# Patient Record
Sex: Male | Born: 1935
Health system: Southern US, Community
[De-identification: ages and names within clinical notes are randomized; demographics above are authoritative.]

## PROBLEM LIST (undated history)

## (undated) DIAGNOSIS — Z87442 Personal history of urinary calculi: Secondary | ICD-10-CM

## (undated) DIAGNOSIS — C801 Malignant (primary) neoplasm, unspecified: Secondary | ICD-10-CM

## (undated) DIAGNOSIS — F329 Major depressive disorder, single episode, unspecified: Secondary | ICD-10-CM

## (undated) DIAGNOSIS — G4730 Sleep apnea, unspecified: Secondary | ICD-10-CM

## (undated) DIAGNOSIS — F419 Anxiety disorder, unspecified: Secondary | ICD-10-CM

## (undated) DIAGNOSIS — F32A Depression, unspecified: Secondary | ICD-10-CM

## (undated) DIAGNOSIS — K5090 Crohn's disease, unspecified, without complications: Secondary | ICD-10-CM

## (undated) DIAGNOSIS — E119 Type 2 diabetes mellitus without complications: Secondary | ICD-10-CM

## (undated) DIAGNOSIS — E7800 Pure hypercholesterolemia, unspecified: Secondary | ICD-10-CM

## (undated) DIAGNOSIS — M1990 Unspecified osteoarthritis, unspecified site: Secondary | ICD-10-CM

## (undated) DIAGNOSIS — M545 Low back pain, unspecified: Secondary | ICD-10-CM

## (undated) DIAGNOSIS — T884XXA Failed or difficult intubation, initial encounter: Secondary | ICD-10-CM

## (undated) DIAGNOSIS — I10 Essential (primary) hypertension: Secondary | ICD-10-CM

## (undated) DIAGNOSIS — K219 Gastro-esophageal reflux disease without esophagitis: Secondary | ICD-10-CM

## (undated) DIAGNOSIS — G8929 Other chronic pain: Secondary | ICD-10-CM

## (undated) DIAGNOSIS — M109 Gout, unspecified: Secondary | ICD-10-CM

## (undated) DIAGNOSIS — R7303 Prediabetes: Secondary | ICD-10-CM

## (undated) HISTORY — PX: PROSTATE SURGERY: SHX751

## (undated) HISTORY — DX: Crohn's disease, unspecified, without complications: K50.90

## (undated) HISTORY — PX: PARTIAL COLECTOMY: SHX5273

## (undated) HISTORY — DX: Essential (primary) hypertension: I10

## (undated) HISTORY — DX: Gastro-esophageal reflux disease without esophagitis: K21.9

## (undated) HISTORY — PX: TONSILLECTOMY AND ADENOIDECTOMY: SUR1326

## (undated) HISTORY — PX: THYROIDECTOMY: SHX17

## (undated) HISTORY — PX: COLON RESECTION: SHX5231

## (undated) HISTORY — DX: Prediabetes: R73.03

## (undated) HISTORY — PX: UPPER GASTROINTESTINAL ENDOSCOPY: SHX188

## (undated) HISTORY — PX: COLONOSCOPY: SHX174

## (undated) SURGERY — Surgical Case
Anesthesia: *Unknown

---

## 1957-02-02 HISTORY — PX: HEMORRHOID SURGERY: SHX153

## 1968-10-03 HISTORY — PX: KIDNEY STONE SURGERY: SHX686

## 1971-02-03 HISTORY — PX: COLECTOMY: SHX59

## 2000-09-22 ENCOUNTER — Ambulatory Visit (HOSPITAL_COMMUNITY): Admission: RE | Admit: 2000-09-22 | Discharge: 2000-09-22 | Payer: Self-pay | Admitting: Family Medicine

## 2000-09-24 ENCOUNTER — Encounter: Payer: Self-pay | Admitting: Family Medicine

## 2000-09-24 ENCOUNTER — Ambulatory Visit (HOSPITAL_COMMUNITY): Admission: RE | Admit: 2000-09-24 | Discharge: 2000-09-24 | Payer: Self-pay | Admitting: Family Medicine

## 2000-09-29 ENCOUNTER — Ambulatory Visit (HOSPITAL_COMMUNITY): Admission: RE | Admit: 2000-09-29 | Discharge: 2000-09-29 | Payer: Self-pay | Admitting: Cardiology

## 2000-09-29 ENCOUNTER — Encounter: Payer: Self-pay | Admitting: Cardiology

## 2001-12-05 ENCOUNTER — Ambulatory Visit (HOSPITAL_COMMUNITY): Admission: RE | Admit: 2001-12-05 | Discharge: 2001-12-05 | Payer: Self-pay | Admitting: Internal Medicine

## 2004-03-05 ENCOUNTER — Ambulatory Visit: Payer: Self-pay | Admitting: Internal Medicine

## 2004-03-12 ENCOUNTER — Ambulatory Visit: Payer: Self-pay | Admitting: Internal Medicine

## 2004-03-12 ENCOUNTER — Ambulatory Visit (HOSPITAL_COMMUNITY): Admission: RE | Admit: 2004-03-12 | Discharge: 2004-03-12 | Payer: Self-pay | Admitting: Internal Medicine

## 2004-05-30 ENCOUNTER — Ambulatory Visit (HOSPITAL_COMMUNITY): Admission: RE | Admit: 2004-05-30 | Discharge: 2004-05-30 | Payer: Self-pay | Admitting: Internal Medicine

## 2004-06-02 ENCOUNTER — Ambulatory Visit: Payer: Self-pay | Admitting: Internal Medicine

## 2005-10-14 ENCOUNTER — Ambulatory Visit: Payer: Self-pay | Admitting: Internal Medicine

## 2010-05-20 ENCOUNTER — Ambulatory Visit (INDEPENDENT_AMBULATORY_CARE_PROVIDER_SITE_OTHER): Payer: Medicare Other | Admitting: Internal Medicine

## 2010-05-20 DIAGNOSIS — K5090 Crohn's disease, unspecified, without complications: Secondary | ICD-10-CM

## 2011-02-05 DIAGNOSIS — D649 Anemia, unspecified: Secondary | ICD-10-CM | POA: Diagnosis not present

## 2011-02-05 DIAGNOSIS — Z79899 Other long term (current) drug therapy: Secondary | ICD-10-CM | POA: Diagnosis not present

## 2011-02-05 DIAGNOSIS — E782 Mixed hyperlipidemia: Secondary | ICD-10-CM | POA: Diagnosis not present

## 2011-02-05 DIAGNOSIS — M109 Gout, unspecified: Secondary | ICD-10-CM | POA: Diagnosis not present

## 2011-02-05 DIAGNOSIS — E119 Type 2 diabetes mellitus without complications: Secondary | ICD-10-CM | POA: Diagnosis not present

## 2011-02-05 DIAGNOSIS — Z125 Encounter for screening for malignant neoplasm of prostate: Secondary | ICD-10-CM | POA: Diagnosis not present

## 2011-02-05 DIAGNOSIS — Z0000 Encounter for general adult medical examination without abnormal findings: Secondary | ICD-10-CM | POA: Diagnosis not present

## 2011-03-10 DIAGNOSIS — R809 Proteinuria, unspecified: Secondary | ICD-10-CM | POA: Diagnosis not present

## 2011-03-10 DIAGNOSIS — R7309 Other abnormal glucose: Secondary | ICD-10-CM | POA: Diagnosis not present

## 2011-04-07 DIAGNOSIS — Z79899 Other long term (current) drug therapy: Secondary | ICD-10-CM | POA: Diagnosis not present

## 2011-05-07 ENCOUNTER — Encounter (INDEPENDENT_AMBULATORY_CARE_PROVIDER_SITE_OTHER): Payer: Self-pay | Admitting: *Deleted

## 2011-06-02 DIAGNOSIS — H2510 Age-related nuclear cataract, unspecified eye: Secondary | ICD-10-CM | POA: Diagnosis not present

## 2011-06-02 DIAGNOSIS — H52229 Regular astigmatism, unspecified eye: Secondary | ICD-10-CM | POA: Diagnosis not present

## 2011-06-02 DIAGNOSIS — H5200 Hypermetropia, unspecified eye: Secondary | ICD-10-CM | POA: Diagnosis not present

## 2011-06-02 DIAGNOSIS — H524 Presbyopia: Secondary | ICD-10-CM | POA: Diagnosis not present

## 2011-06-23 ENCOUNTER — Encounter (INDEPENDENT_AMBULATORY_CARE_PROVIDER_SITE_OTHER): Payer: Self-pay | Admitting: Internal Medicine

## 2011-06-23 ENCOUNTER — Ambulatory Visit (INDEPENDENT_AMBULATORY_CARE_PROVIDER_SITE_OTHER): Payer: Medicare Other | Admitting: Internal Medicine

## 2011-06-23 VITALS — BP 130/78 | HR 80 | Temp 98.9°F | Resp 20 | Ht 69.0 in | Wt 227.9 lb

## 2011-06-23 DIAGNOSIS — E785 Hyperlipidemia, unspecified: Secondary | ICD-10-CM | POA: Insufficient documentation

## 2011-06-23 DIAGNOSIS — M109 Gout, unspecified: Secondary | ICD-10-CM | POA: Insufficient documentation

## 2011-06-23 DIAGNOSIS — K219 Gastro-esophageal reflux disease without esophagitis: Secondary | ICD-10-CM | POA: Insufficient documentation

## 2011-06-23 DIAGNOSIS — K5080 Crohn's disease of both small and large intestine without complications: Secondary | ICD-10-CM

## 2011-06-23 DIAGNOSIS — I10 Essential (primary) hypertension: Secondary | ICD-10-CM | POA: Insufficient documentation

## 2011-06-23 NOTE — Patient Instructions (Signed)
No changes made in medications today.

## 2011-06-23 NOTE — Progress Notes (Signed)
Presenting complaint;  Follow for ileocolonic Crohn's disease.  Subjective:  Thomas David is 76 year old Caucasian male who has a 40 year history of inflammatory bowel disease. He had right, colectomy over 20 years ago. His last colonoscopy was in July 20 10th revealing active disease at ileocolonic anastomosis with ulceration and noncritical stricture and few colonic erosions. He has been maintained on mesalamine for years. He was last seen one year ago. He continues to feel well. He generally has 2-4 bowel movements per day. Stools are usually phoned to sound muffled. He denies melena rectal bleeding abdominal pain anorexia or weight loss. His heartburn is well controlled with PPI. Patient tells me that his granddaughter whom I treated in the past and who now lives in [place] was given Remicade and had severe reaction but is doing better now.  Current Medications: Current Outpatient Prescriptions  Medication Sig Dispense Refill  . allopurinol (ZYLOPRIM) 300 MG tablet Take 300 mg by mouth.      Marland Kitchen amLODipine (NORVASC) 5 MG tablet Take 5 mg by mouth daily.      Marland Kitchen lisinopril (PRINIVIL,ZESTRIL) 2.5 MG tablet Take 2.5 mg by mouth daily.      . mesalamine (PENTASA) 500 MG CR capsule Take 500 mg by mouth. Patient takes 2 by mouth twice a day      . Multiple Vitamins-Minerals (MULTI FOR HIM) CAPS Take by mouth daily.      . nadolol (CORGARD) 80 MG tablet Take 80 mg by mouth daily.      Marland Kitchen omeprazole (PRILOSEC) 20 MG capsule Take 20 mg by mouth daily.      . simvastatin (ZOCOR) 20 MG tablet Take 20 mg by mouth every evening.         Objective: Blood pressure 130/78, pulse 80, temperature 98.9 F (37.2 C), temperature source Oral, resp. rate 20, height 5' 9"  (1.753 m), weight 227 lb 14.4 oz (103.375 kg). Conjunctiva is pink. Sclera is nonicteric Oropharyngeal mucosa is normal. No neck masses or thyromegaly noted. Cardiac exam with regular rhythm normal S1 and S2. No murmur or gallop noted. Lungs are  clear to auscultation. Abdomen is full. Bowel sounds are normal. On palpation is soft and nontender without organomegaly or masses.  No LE edema or clubbing noted.  Labs/studies Results: None available. He did have repeat blood count at the time of his physical examination earlier this year and tests were normal.  Assessment:  Ileocolonic Crohn's disease. Patient is on low dose mesalamine and appears to be doing well.   Plan:  Continue Pentasa and 1 g by mouth twice a day. Samples given. Unless has problems will return for office visit in one year. Will consider colonoscopy in July 2015.

## 2011-07-20 DIAGNOSIS — E785 Hyperlipidemia, unspecified: Secondary | ICD-10-CM | POA: Diagnosis not present

## 2011-07-20 DIAGNOSIS — E782 Mixed hyperlipidemia: Secondary | ICD-10-CM | POA: Diagnosis not present

## 2011-07-20 DIAGNOSIS — E119 Type 2 diabetes mellitus without complications: Secondary | ICD-10-CM | POA: Diagnosis not present

## 2011-07-20 DIAGNOSIS — I10 Essential (primary) hypertension: Secondary | ICD-10-CM | POA: Diagnosis not present

## 2011-07-20 DIAGNOSIS — Z79899 Other long term (current) drug therapy: Secondary | ICD-10-CM | POA: Diagnosis not present

## 2011-12-09 DIAGNOSIS — Z23 Encounter for immunization: Secondary | ICD-10-CM | POA: Diagnosis not present

## 2012-02-08 ENCOUNTER — Other Ambulatory Visit: Payer: Self-pay | Admitting: Family Medicine

## 2012-02-08 ENCOUNTER — Ambulatory Visit (HOSPITAL_COMMUNITY)
Admission: RE | Admit: 2012-02-08 | Discharge: 2012-02-08 | Disposition: A | Discharge status: Home / Self Care | Payer: Medicare Other | Source: Ambulatory Visit | Attending: Family Medicine | Admitting: Family Medicine

## 2012-02-08 DIAGNOSIS — R05 Cough: Secondary | ICD-10-CM

## 2012-02-08 DIAGNOSIS — R059 Cough, unspecified: Secondary | ICD-10-CM | POA: Insufficient documentation

## 2012-02-08 DIAGNOSIS — E782 Mixed hyperlipidemia: Secondary | ICD-10-CM | POA: Diagnosis not present

## 2012-02-08 DIAGNOSIS — R918 Other nonspecific abnormal finding of lung field: Secondary | ICD-10-CM | POA: Diagnosis not present

## 2012-02-08 DIAGNOSIS — E119 Type 2 diabetes mellitus without complications: Secondary | ICD-10-CM | POA: Diagnosis not present

## 2012-02-08 DIAGNOSIS — Z125 Encounter for screening for malignant neoplasm of prostate: Secondary | ICD-10-CM | POA: Diagnosis not present

## 2012-02-08 DIAGNOSIS — J984 Other disorders of lung: Secondary | ICD-10-CM | POA: Diagnosis not present

## 2012-02-08 DIAGNOSIS — E785 Hyperlipidemia, unspecified: Secondary | ICD-10-CM | POA: Diagnosis not present

## 2012-02-08 DIAGNOSIS — Z0000 Encounter for general adult medical examination without abnormal findings: Secondary | ICD-10-CM | POA: Diagnosis not present

## 2012-02-08 DIAGNOSIS — M109 Gout, unspecified: Secondary | ICD-10-CM | POA: Diagnosis not present

## 2012-02-08 DIAGNOSIS — Z79899 Other long term (current) drug therapy: Secondary | ICD-10-CM | POA: Diagnosis not present

## 2012-02-16 ENCOUNTER — Other Ambulatory Visit: Payer: Self-pay | Admitting: Family Medicine

## 2012-02-16 DIAGNOSIS — R05 Cough: Secondary | ICD-10-CM

## 2012-02-24 ENCOUNTER — Ambulatory Visit (HOSPITAL_COMMUNITY)
Admission: RE | Admit: 2012-02-24 | Discharge: 2012-02-24 | Disposition: A | Discharge status: Home / Self Care | Payer: Medicare Other | Source: Ambulatory Visit | Attending: Family Medicine | Admitting: Family Medicine

## 2012-02-24 DIAGNOSIS — R918 Other nonspecific abnormal finding of lung field: Secondary | ICD-10-CM | POA: Diagnosis not present

## 2012-02-24 DIAGNOSIS — R059 Cough, unspecified: Secondary | ICD-10-CM | POA: Insufficient documentation

## 2012-02-24 DIAGNOSIS — R05 Cough: Secondary | ICD-10-CM | POA: Diagnosis not present

## 2012-02-24 DIAGNOSIS — R599 Enlarged lymph nodes, unspecified: Secondary | ICD-10-CM | POA: Insufficient documentation

## 2012-02-24 MED ORDER — IOHEXOL 300 MG/ML  SOLN
80.0000 mL | Freq: Once | INTRAMUSCULAR | Status: AC | PRN
  Administered 2012-02-24: 80 mL via INTRAVENOUS

## 2012-02-25 ENCOUNTER — Other Ambulatory Visit: Payer: Self-pay | Admitting: Family Medicine

## 2012-02-25 DIAGNOSIS — E279 Disorder of adrenal gland, unspecified: Secondary | ICD-10-CM

## 2012-02-25 DIAGNOSIS — E0590 Thyrotoxicosis, unspecified without thyrotoxic crisis or storm: Secondary | ICD-10-CM | POA: Diagnosis not present

## 2012-02-25 DIAGNOSIS — E049 Nontoxic goiter, unspecified: Secondary | ICD-10-CM

## 2012-02-25 DIAGNOSIS — D497 Neoplasm of unspecified behavior of endocrine glands and other parts of nervous system: Secondary | ICD-10-CM | POA: Diagnosis not present

## 2012-02-25 DIAGNOSIS — Z79899 Other long term (current) drug therapy: Secondary | ICD-10-CM | POA: Diagnosis not present

## 2012-03-01 ENCOUNTER — Ambulatory Visit (HOSPITAL_COMMUNITY)
Admission: RE | Admit: 2012-03-01 | Discharge: 2012-03-01 | Disposition: A | Discharge status: Home / Self Care | Payer: Medicare Other | Source: Ambulatory Visit | Attending: Family Medicine | Admitting: Family Medicine

## 2012-03-01 DIAGNOSIS — E049 Nontoxic goiter, unspecified: Secondary | ICD-10-CM

## 2012-03-01 DIAGNOSIS — Q619 Cystic kidney disease, unspecified: Secondary | ICD-10-CM | POA: Insufficient documentation

## 2012-03-01 DIAGNOSIS — K7689 Other specified diseases of liver: Secondary | ICD-10-CM | POA: Diagnosis not present

## 2012-03-01 DIAGNOSIS — E041 Nontoxic single thyroid nodule: Secondary | ICD-10-CM | POA: Diagnosis not present

## 2012-03-01 DIAGNOSIS — E279 Disorder of adrenal gland, unspecified: Secondary | ICD-10-CM | POA: Insufficient documentation

## 2012-03-01 DIAGNOSIS — D3500 Benign neoplasm of unspecified adrenal gland: Secondary | ICD-10-CM | POA: Diagnosis not present

## 2012-03-01 DIAGNOSIS — N200 Calculus of kidney: Secondary | ICD-10-CM | POA: Diagnosis not present

## 2012-03-01 MED ORDER — GADOBENATE DIMEGLUMINE 529 MG/ML IV SOLN
19.0000 mL | Freq: Once | INTRAVENOUS | Status: AC | PRN
  Administered 2012-03-01: 19 mL via INTRAVENOUS

## 2012-03-02 DIAGNOSIS — N281 Cyst of kidney, acquired: Secondary | ICD-10-CM | POA: Diagnosis not present

## 2012-03-02 DIAGNOSIS — E041 Nontoxic single thyroid nodule: Secondary | ICD-10-CM | POA: Diagnosis not present

## 2012-03-02 DIAGNOSIS — G4730 Sleep apnea, unspecified: Secondary | ICD-10-CM | POA: Diagnosis not present

## 2012-03-02 DIAGNOSIS — D497 Neoplasm of unspecified behavior of endocrine glands and other parts of nervous system: Secondary | ICD-10-CM | POA: Diagnosis not present

## 2012-03-03 ENCOUNTER — Other Ambulatory Visit: Payer: Self-pay | Admitting: Family Medicine

## 2012-03-03 DIAGNOSIS — E049 Nontoxic goiter, unspecified: Secondary | ICD-10-CM

## 2012-03-08 ENCOUNTER — Other Ambulatory Visit (HOSPITAL_COMMUNITY): Payer: Self-pay | Admitting: Family Medicine

## 2012-03-08 ENCOUNTER — Ambulatory Visit (HOSPITAL_COMMUNITY)
Admission: RE | Admit: 2012-03-08 | Discharge: 2012-03-08 | Disposition: A | Discharge status: Home / Self Care | Payer: Medicare Other | Source: Ambulatory Visit | Attending: Family Medicine | Admitting: Family Medicine

## 2012-03-08 ENCOUNTER — Other Ambulatory Visit: Payer: Self-pay | Admitting: Family Medicine

## 2012-03-08 ENCOUNTER — Inpatient Hospital Stay (HOSPITAL_COMMUNITY)
Admission: RE | Admit: 2012-03-08 | Discharge: 2012-03-08 | Disposition: A | Discharge status: Home / Self Care | Payer: Medicare Other | Source: Ambulatory Visit

## 2012-03-08 DIAGNOSIS — E049 Nontoxic goiter, unspecified: Secondary | ICD-10-CM | POA: Diagnosis not present

## 2012-03-08 DIAGNOSIS — R221 Localized swelling, mass and lump, neck: Secondary | ICD-10-CM | POA: Diagnosis not present

## 2012-03-08 DIAGNOSIS — R220 Localized swelling, mass and lump, head: Secondary | ICD-10-CM | POA: Diagnosis not present

## 2012-03-08 MED ORDER — LIDOCAINE HCL (PF) 2 % IJ SOLN
INTRAMUSCULAR | Status: AC
  Filled 2012-03-08: qty 10

## 2012-03-08 NOTE — Progress Notes (Signed)
Pt here for Right Thyroid Biopsy.    Procedure started at 1200.  Lidocaine 2% 1cc injected into  right neck.

## 2012-04-06 ENCOUNTER — Other Ambulatory Visit: Payer: Self-pay | Admitting: Otolaryngology

## 2012-04-06 DIAGNOSIS — D449 Neoplasm of uncertain behavior of unspecified endocrine gland: Secondary | ICD-10-CM | POA: Diagnosis not present

## 2012-04-06 DIAGNOSIS — J310 Chronic rhinitis: Secondary | ICD-10-CM | POA: Diagnosis not present

## 2012-04-20 ENCOUNTER — Encounter (HOSPITAL_COMMUNITY): Payer: Self-pay

## 2012-04-21 ENCOUNTER — Other Ambulatory Visit: Payer: Self-pay

## 2012-04-21 DIAGNOSIS — G4700 Insomnia, unspecified: Secondary | ICD-10-CM

## 2012-04-28 ENCOUNTER — Encounter (HOSPITAL_COMMUNITY)
Admission: RE | Admit: 2012-04-28 | Discharge: 2012-04-28 | Disposition: A | Discharge status: Home / Self Care | Payer: Medicare Other | Source: Ambulatory Visit | Attending: Otolaryngology | Admitting: Otolaryngology

## 2012-04-28 ENCOUNTER — Encounter (HOSPITAL_COMMUNITY): Payer: Self-pay

## 2012-04-28 ENCOUNTER — Ambulatory Visit (HOSPITAL_COMMUNITY)
Admission: RE | Admit: 2012-04-28 | Discharge: 2012-04-28 | Disposition: A | Discharge status: Home / Self Care | Payer: Medicare Other | Source: Ambulatory Visit | Attending: Anesthesiology | Admitting: Anesthesiology

## 2012-04-28 DIAGNOSIS — E0789 Other specified disorders of thyroid: Secondary | ICD-10-CM | POA: Insufficient documentation

## 2012-04-28 DIAGNOSIS — Z01818 Encounter for other preprocedural examination: Secondary | ICD-10-CM | POA: Insufficient documentation

## 2012-04-28 DIAGNOSIS — Z01811 Encounter for preprocedural respiratory examination: Secondary | ICD-10-CM | POA: Diagnosis not present

## 2012-04-28 DIAGNOSIS — J9819 Other pulmonary collapse: Secondary | ICD-10-CM | POA: Diagnosis not present

## 2012-04-28 DIAGNOSIS — Z01812 Encounter for preprocedural laboratory examination: Secondary | ICD-10-CM | POA: Insufficient documentation

## 2012-04-28 DIAGNOSIS — Z01810 Encounter for preprocedural cardiovascular examination: Secondary | ICD-10-CM | POA: Insufficient documentation

## 2012-04-28 HISTORY — DX: Unspecified osteoarthritis, unspecified site: M19.90

## 2012-04-28 LAB — BASIC METABOLIC PANEL
BUN: 13 mg/dL (ref 6–23)
CO2: 25 mEq/L (ref 19–32)
Calcium: 9.7 mg/dL (ref 8.4–10.5)
Creatinine, Ser: 0.76 mg/dL (ref 0.50–1.35)
Glucose, Bld: 120 mg/dL — ABNORMAL HIGH (ref 70–99)

## 2012-04-28 LAB — CBC
MCH: 29.5 pg (ref 26.0–34.0)
MCV: 87 fL (ref 78.0–100.0)
Platelets: 191 10*3/uL (ref 150–400)
RDW: 13.9 % (ref 11.5–15.5)

## 2012-04-28 NOTE — Progress Notes (Signed)
04/28/12 1025  OBSTRUCTIVE SLEEP APNEA  Have you ever been diagnosed with sleep apnea through a sleep study? No (Schedule for test April 30)  Do you snore loudly (loud enough to be heard through closed doors)?  1  Do you often feel tired, fatigued, or sleepy during the daytime? 0  Has anyone observed you stop breathing during your sleep? 1  Do you have, or are you being treated for high blood pressure? 1  BMI more than 35 kg/m2? 0  Age over 75 years old? 1  Neck circumference greater than 40 cm/18 inches? 1  Gender: 1  Obstructive Sleep Apnea Score 6  Score 4 or greater  Results sent to PCP (scheduled for Sleep Study April 30)

## 2012-04-28 NOTE — Pre-Procedure Instructions (Signed)
Jeremaine Maraj Abed  04/28/2012   Your procedure is scheduled on:  April 2  Report to [facility] at 08:00 AM.  Call this number if you have problems the morning of surgery: [phone]   Remember:   Do not eat food or drink liquids after midnight.   Take these medicines the morning of surgery with A SIP OF WATER: Amlodipine, Flonase, Metoprolol, Omeprazole, Eye drops   STOP Vitamin B12, Xango Vitamins, Aspirin after today  Do not wear jewelry, make-up or nail polish.  Do not wear lotions, powders, or perfumes. You may wear deodorant.  Do not shave 48 hours prior to surgery. Men may shave face and neck.  Do not bring valuables to the hospital.  Contacts, dentures or bridgework may not be worn into surgery.  Leave suitcase in the car. After surgery it may be brought to your room.  For patients admitted to the hospital, checkout time is 11:00 AM the day of discharge.   Patients discharged the day of surgery will not be allowed to drive home.  Name and phone number of your driver: Family/ Friend  Special Instructions: Shower using CHG 2 nights before surgery and the night before surgery.  If you shower the day of surgery use CHG.  Use special wash - you have one bottle of CHG for all showers.  You should use approximately 1/3 of the bottle for each shower.   Please read over the following fact sheets that you were given: Pain Booklet, Coughing and Deep Breathing and Surgical Site Infection Prevention

## 2012-04-28 NOTE — Progress Notes (Signed)
Repeat CXR ordered today after speaking with [place], [place]

## 2012-05-04 ENCOUNTER — Encounter (HOSPITAL_COMMUNITY): Payer: Self-pay | Admitting: *Deleted

## 2012-05-04 ENCOUNTER — Encounter (HOSPITAL_COMMUNITY): Payer: Self-pay | Admitting: Vascular Surgery

## 2012-05-04 ENCOUNTER — Ambulatory Visit (HOSPITAL_COMMUNITY)
Admission: RE | Admit: 2012-05-04 | Discharge: 2012-05-05 | Disposition: A | Discharge status: Home / Self Care | Payer: Medicare Other | Source: Ambulatory Visit | Attending: Otolaryngology | Admitting: Otolaryngology

## 2012-05-04 ENCOUNTER — Ambulatory Visit (HOSPITAL_COMMUNITY): Payer: Medicare Other | Admitting: Certified Registered Nurse Anesthetist

## 2012-05-04 ENCOUNTER — Encounter (HOSPITAL_COMMUNITY)
Admission: RE | Disposition: A | Discharge status: Home / Self Care | Payer: Self-pay | Source: Ambulatory Visit | Attending: Otolaryngology

## 2012-05-04 DIAGNOSIS — D449 Neoplasm of uncertain behavior of unspecified endocrine gland: Secondary | ICD-10-CM | POA: Diagnosis not present

## 2012-05-04 DIAGNOSIS — K5090 Crohn's disease, unspecified, without complications: Secondary | ICD-10-CM | POA: Diagnosis not present

## 2012-05-04 DIAGNOSIS — E042 Nontoxic multinodular goiter: Secondary | ICD-10-CM | POA: Diagnosis not present

## 2012-05-04 DIAGNOSIS — I10 Essential (primary) hypertension: Secondary | ICD-10-CM | POA: Diagnosis not present

## 2012-05-04 DIAGNOSIS — K219 Gastro-esophageal reflux disease without esophagitis: Secondary | ICD-10-CM | POA: Diagnosis not present

## 2012-05-04 DIAGNOSIS — Z9889 Other specified postprocedural states: Secondary | ICD-10-CM

## 2012-05-04 HISTORY — DX: Gout, unspecified: M10.9

## 2012-05-04 HISTORY — DX: Other chronic pain: G89.29

## 2012-05-04 HISTORY — DX: Pure hypercholesterolemia, unspecified: E78.00

## 2012-05-04 HISTORY — DX: Low back pain: M54.5

## 2012-05-04 HISTORY — PX: THYROIDECTOMY: SHX17

## 2012-05-04 HISTORY — DX: Low back pain, unspecified: M54.50

## 2012-05-04 SURGERY — THYROIDECTOMY
Anesthesia: General | Site: Neck | Laterality: Right | Wound class: Clean

## 2012-05-04 MED ORDER — OXYCODONE-ACETAMINOPHEN 5-325 MG PO TABS
1.0000 | ORAL_TABLET | ORAL | Status: DC | PRN
Start: 2012-05-04 — End: 2012-06-29

## 2012-05-04 MED ORDER — HEMOSTATIC AGENTS (NO CHARGE) OPTIME
TOPICAL | Status: DC | PRN
  Administered 2012-05-04: 1 via TOPICAL

## 2012-05-04 MED ORDER — ACETAMINOPHEN 160 MG/5ML PO SOLN: 650.0000 mg | ORAL | Status: DC | PRN

## 2012-05-04 MED ORDER — CEFAZOLIN SODIUM 1-5 GM-% IV SOLN
INTRAVENOUS | Status: DC | PRN
  Administered 2012-05-04: 2 g via INTRAVENOUS

## 2012-05-04 MED ORDER — MIDAZOLAM HCL 5 MG/5ML IJ SOLN
INTRAMUSCULAR | Status: DC | PRN
  Administered 2012-05-04: 2 mg via INTRAVENOUS

## 2012-05-04 MED ORDER — ZOLPIDEM TARTRATE 5 MG PO TABS
5.0000 mg | ORAL_TABLET | Freq: Every evening | ORAL | Status: DC | PRN
  Administered 2012-05-05: 5 mg via ORAL
  Filled 2012-05-04: qty 1

## 2012-05-04 MED ORDER — LIDOCAINE-EPINEPHRINE 1 %-1:100000 IJ SOLN
INTRAMUSCULAR | Status: AC
  Filled 2012-05-04: qty 1

## 2012-05-04 MED ORDER — METOPROLOL TARTRATE 100 MG PO TABS
100.0000 mg | ORAL_TABLET | Freq: Every day | ORAL | Status: DC
  Administered 2012-05-04: 100 mg via ORAL
  Filled 2012-05-04 (×2): qty 1

## 2012-05-04 MED ORDER — AMLODIPINE BESYLATE 5 MG PO TABS
5.0000 mg | ORAL_TABLET | Freq: Every day | ORAL | Status: DC
  Administered 2012-05-04: 5 mg via ORAL
  Filled 2012-05-04 (×2): qty 1

## 2012-05-04 MED ORDER — NAPHAZOLINE HCL 0.1 % OP SOLN
1.0000 [drp] | Freq: Four times a day (QID) | OPHTHALMIC | Status: DC | PRN
  Filled 2012-05-04: qty 15

## 2012-05-04 MED ORDER — KCL IN DEXTROSE-NACL 20-5-0.45 MEQ/L-%-% IV SOLN
INTRAVENOUS | Status: DC
  Administered 2012-05-04: 22:00:00 via INTRAVENOUS
  Filled 2012-05-04 (×4): qty 1000

## 2012-05-04 MED ORDER — LIDOCAINE HCL (CARDIAC) 20 MG/ML IV SOLN
INTRAVENOUS | Status: DC | PRN
  Administered 2012-05-04: 100 mg via INTRAVENOUS

## 2012-05-04 MED ORDER — PANTOPRAZOLE SODIUM 40 MG PO TBEC
40.0000 mg | DELAYED_RELEASE_TABLET | Freq: Every day | ORAL | Status: DC
  Administered 2012-05-04: 40 mg via ORAL
  Filled 2012-05-04 (×2): qty 1

## 2012-05-04 MED ORDER — EPHEDRINE SULFATE 50 MG/ML IJ SOLN
INTRAMUSCULAR | Status: DC | PRN
  Administered 2012-05-04 (×2): 5 mg via INTRAVENOUS

## 2012-05-04 MED ORDER — SIMVASTATIN 20 MG PO TABS
20.0000 mg | ORAL_TABLET | Freq: Every evening | ORAL | Status: DC
  Administered 2012-05-04: 20 mg via ORAL
  Filled 2012-05-04 (×2): qty 1

## 2012-05-04 MED ORDER — ONDANSETRON HCL 4 MG/2ML IJ SOLN
INTRAMUSCULAR | Status: DC | PRN
  Administered 2012-05-04: 4 mg via INTRAVENOUS

## 2012-05-04 MED ORDER — GLYCOPYRROLATE 0.2 MG/ML IJ SOLN
INTRAMUSCULAR | Status: DC | PRN
  Administered 2012-05-04: .8 mg via INTRAVENOUS

## 2012-05-04 MED ORDER — CEFAZOLIN SODIUM 1-5 GM-% IV SOLN
1.0000 g | Freq: Three times a day (TID) | INTRAVENOUS | Status: AC
  Administered 2012-05-04 – 2012-05-05 (×3): 1 g via INTRAVENOUS
  Filled 2012-05-04 (×4): qty 50

## 2012-05-04 MED ORDER — LISINOPRIL 2.5 MG PO TABS
2.5000 mg | ORAL_TABLET | Freq: Every day | ORAL | Status: DC
  Administered 2012-05-04: 2.5 mg via ORAL
  Filled 2012-05-04 (×2): qty 1

## 2012-05-04 MED ORDER — PROPOFOL 10 MG/ML IV BOLUS
INTRAVENOUS | Status: DC | PRN
  Administered 2012-05-04: 30 mg via INTRAVENOUS
  Administered 2012-05-04: 200 mg via INTRAVENOUS

## 2012-05-04 MED ORDER — ALLOPURINOL 300 MG PO TABS
300.0000 mg | ORAL_TABLET | Freq: Every day | ORAL | Status: DC
  Filled 2012-05-04: qty 1

## 2012-05-04 MED ORDER — ACETAMINOPHEN 650 MG RE SUPP: 650.0000 mg | RECTAL | Status: DC | PRN

## 2012-05-04 MED ORDER — PROMETHAZINE HCL 25 MG RE SUPP: 25.0000 mg | Freq: Four times a day (QID) | RECTAL | Status: DC | PRN

## 2012-05-04 MED ORDER — HYDROMORPHONE HCL PF 1 MG/ML IJ SOLN
0.2500 mg | INTRAMUSCULAR | Status: DC | PRN
  Administered 2012-05-04 (×2): 0.5 mg via INTRAVENOUS

## 2012-05-04 MED ORDER — LACTATED RINGERS IV SOLN
INTRAVENOUS | Status: DC | PRN
  Administered 2012-05-04 (×2): via INTRAVENOUS

## 2012-05-04 MED ORDER — PROMETHAZINE HCL 25 MG PO TABS
25.0000 mg | ORAL_TABLET | Freq: Four times a day (QID) | ORAL | Status: DC | PRN
  Filled 2012-05-04: qty 1

## 2012-05-04 MED ORDER — MORPHINE SULFATE 2 MG/ML IJ SOLN: 2.0000 mg | INTRAMUSCULAR | Status: DC | PRN

## 2012-05-04 MED ORDER — OXYCODONE-ACETAMINOPHEN 5-325 MG PO TABS
1.0000 | ORAL_TABLET | ORAL | Status: DC | PRN
  Administered 2012-05-04: 2 via ORAL
  Administered 2012-05-05: 1 via ORAL
  Filled 2012-05-04: qty 2
  Filled 2012-05-04: qty 1
  Filled 2012-05-04: qty 2

## 2012-05-04 MED ORDER — NEOSTIGMINE METHYLSULFATE 1 MG/ML IJ SOLN
INTRAMUSCULAR | Status: DC | PRN
  Administered 2012-05-04: 5 mg via INTRAVENOUS

## 2012-05-04 MED ORDER — 0.9 % SODIUM CHLORIDE (POUR BTL) OPTIME
TOPICAL | Status: DC | PRN
  Administered 2012-05-04: 1000 mL

## 2012-05-04 MED ORDER — BACITRACIN ZINC 500 UNIT/GM EX OINT
TOPICAL_OINTMENT | CUTANEOUS | Status: AC
  Filled 2012-05-04: qty 15

## 2012-05-04 MED ORDER — MESALAMINE ER 250 MG PO CPCR
1000.0000 mg | ORAL_CAPSULE | Freq: Two times a day (BID) | ORAL | Status: DC
  Administered 2012-05-04: 1000 mg via ORAL
  Filled 2012-05-04 (×4): qty 4

## 2012-05-04 MED ORDER — HYDROMORPHONE HCL PF 1 MG/ML IJ SOLN
INTRAMUSCULAR | Status: AC
  Filled 2012-05-04: qty 1

## 2012-05-04 MED ORDER — CEFAZOLIN SODIUM 1-5 GM-% IV SOLN
INTRAVENOUS | Status: AC
  Filled 2012-05-04: qty 100

## 2012-05-04 MED ORDER — LIDOCAINE-EPINEPHRINE 1 %-1:100000 IJ SOLN
INTRAMUSCULAR | Status: DC | PRN
  Administered 2012-05-04: 20 mL

## 2012-05-04 MED ORDER — ROCURONIUM BROMIDE 100 MG/10ML IV SOLN
INTRAVENOUS | Status: DC | PRN
  Administered 2012-05-04: 40 mg via INTRAVENOUS

## 2012-05-04 MED ORDER — FENTANYL CITRATE 0.05 MG/ML IJ SOLN
INTRAMUSCULAR | Status: DC | PRN
  Administered 2012-05-04: 50 ug via INTRAVENOUS
  Administered 2012-05-04: 100 ug via INTRAVENOUS
  Administered 2012-05-04 (×3): 50 ug via INTRAVENOUS

## 2012-05-04 SURGICAL SUPPLY — 46 items
ADH SKN CLS APL DERMABOND .7 (GAUZE/BANDAGES/DRESSINGS) ×1
ATTRACTOMAT 16X20 MAGNETIC DRP (DRAPES) ×1 IMPLANT
BLADE SURG 15 STRL LF DISP TIS (BLADE) IMPLANT
BLADE SURG 15 STRL SS (BLADE)
BLADE SURG CLIPPER 3M 9600 (MISCELLANEOUS) IMPLANT
CANISTER SUCTION 2500CC (MISCELLANEOUS) ×2 IMPLANT
CLEANER TIP ELECTROSURG 2X2 (MISCELLANEOUS) ×2 IMPLANT
CLIP TI WIDE RED SMALL 24 (CLIP) IMPLANT
CLOTH BEACON ORANGE TIMEOUT ST (SAFETY) ×2 IMPLANT
CONT SPEC 4OZ CLIKSEAL STRL BL (MISCELLANEOUS) IMPLANT
CORDS BIPOLAR (ELECTRODE) ×2 IMPLANT
COVER SURGICAL LIGHT HANDLE (MISCELLANEOUS) ×2 IMPLANT
DERMABOND ADVANCED (GAUZE/BANDAGES/DRESSINGS) ×1
DERMABOND ADVANCED .7 DNX12 (GAUZE/BANDAGES/DRESSINGS) ×1 IMPLANT
DRAIN CHANNEL 10F 3/8 F FF (DRAIN) ×1 IMPLANT
DRAIN HEMOVAC 1/8 X 5 (WOUND CARE) ×1 IMPLANT
ELECT COATED BLADE 2.86 ST (ELECTRODE) ×2 IMPLANT
ELECT REM PT RETURN 9FT ADLT (ELECTROSURGICAL) ×2
ELECTRODE REM PT RTRN 9FT ADLT (ELECTROSURGICAL) ×1 IMPLANT
EVACUATOR SILICONE 100CC (DRAIN) ×1 IMPLANT
GAUZE SPONGE 4X4 16PLY XRAY LF (GAUZE/BANDAGES/DRESSINGS) ×5 IMPLANT
GLOVE BIO SURGEON STRL SZ7.5 (GLOVE) ×1 IMPLANT
GLOVE BIOGEL PI IND STRL 6.5 (GLOVE) IMPLANT
GLOVE BIOGEL PI INDICATOR 6.5 (GLOVE) ×1
GLOVE ECLIPSE 6.5 STRL STRAW (GLOVE) ×1 IMPLANT
GLOVE ECLIPSE 7.5 STRL STRAW (GLOVE) ×2 IMPLANT
GOWN STRL NON-REIN LRG LVL3 (GOWN DISPOSABLE) ×5 IMPLANT
KIT BASIN OR (CUSTOM PROCEDURE TRAY) ×2 IMPLANT
KIT ROOM TURNOVER OR (KITS) ×2 IMPLANT
LOCATOR NERVE 3 VOLT (DISPOSABLE) ×1 IMPLANT
NS IRRIG 1000ML POUR BTL (IV SOLUTION) ×2 IMPLANT
PAD ARMBOARD 7.5X6 YLW CONV (MISCELLANEOUS) ×4 IMPLANT
PENCIL BUTTON HOLSTER BLD 10FT (ELECTRODE) ×2 IMPLANT
SHEARS HARMONIC 9CM CVD (BLADE) ×1 IMPLANT
SPONGE INTESTINAL PEANUT (DISPOSABLE) ×4 IMPLANT
SPONGE SURGIFOAM ABS GEL 12-7 (HEMOSTASIS) ×1 IMPLANT
STAPLER VISISTAT 35W (STAPLE) ×2 IMPLANT
SUT SILK 2 0 FS (SUTURE) ×2 IMPLANT
SUT SILK 2 0 REEL (SUTURE) IMPLANT
SUT SILK 3 0 REEL (SUTURE) ×2 IMPLANT
SUT VICRYL 4-0 PS2 18IN ABS (SUTURE) ×2 IMPLANT
TOWEL OR 17X24 6PK STRL BLUE (TOWEL DISPOSABLE) ×2 IMPLANT
TOWEL OR 17X26 10 PK STRL BLUE (TOWEL DISPOSABLE) ×2 IMPLANT
TRAY ENT MC OR (CUSTOM PROCEDURE TRAY) ×2 IMPLANT
TRAY FOLEY CATH 14FRSI W/METER (CATHETERS) IMPLANT
WATER STERILE IRR 1000ML POUR (IV SOLUTION) ×2 IMPLANT

## 2012-05-04 NOTE — H&P (Signed)
  H&P Update  Pt's original H&P dated 04/06/12 reviewed and placed in chart (to be scanned).  I personally examined the patient today.  No change in health. Proceed with right hemithyroidectomy.

## 2012-05-04 NOTE — Brief Op Note (Signed)
05/04/2012  12:27 PM  PATIENT:  Thomas David  77 y.o. male  PRE-OPERATIVE DIAGNOSIS:  RIGHT THYROID MASS  POST-OPERATIVE DIAGNOSIS:  RIGHT THYROID MASS  PROCEDURE:  Procedure(s): RIGHT HEMI THYROIDECTOMY (Right)  SURGEON:  Surgeon(s) and Role:    * Ascencion Dike, MD - Primary  PHYSICIAN ASSISTANT:   ASSISTANTS: Rometta Emery, PA-C   ANESTHESIA:   general  EBL:  Total I/O In: 1000 [I.V.:1000] Out: 75 [Blood:75]  BLOOD ADMINISTERED:none  DRAINS: (#10) Jackson-Pratt drain(s) with closed bulb suction in the neck   LOCAL MEDICATIONS USED:  LIDOCAINE   SPECIMEN:  Source of Specimen:  Right thyroid lobe  DISPOSITION OF SPECIMEN:  PATHOLOGY  COUNTS:  YES  TOURNIQUET:  * No tourniquets in log *  DICTATION: .Other Dictation: Dictation Number [id number]  PLAN OF CARE: Admit for overnight observation  PATIENT DISPOSITION:  PACU - hemodynamically stable.   Delay start of Pharmacological VTE agent (>24hrs) due to surgical blood loss or risk of bleeding: not applicable

## 2012-05-04 NOTE — Anesthesia Postprocedure Evaluation (Signed)
  Anesthesia Post-op Note  Patient: Thomas David  Procedure(s) Performed: Procedure(s): RIGHT HEMI THYROIDECTOMY (Right)  Patient Location: PACU  Anesthesia Type:General  Level of Consciousness: awake  Airway and Oxygen Therapy: Patient Spontanous Breathing  Post-op Pain: mild  Post-op Assessment: Post-op Vital signs reviewed  Post-op Vital Signs: Reviewed  Complications: No apparent anesthesia complications

## 2012-05-04 NOTE — Preoperative (Signed)
Beta Blockers   Reason not to administer Beta Blockers:Not Applicable, took metoprolol this am 

## 2012-05-04 NOTE — Anesthesia Procedure Notes (Signed)
Procedure Name: Intubation Date/Time: 05/04/2012 10:21 AM Performed by: [place] Pink Pre-anesthesia Checklist: Patient identified, Timeout performed, Emergency Drugs available, Suction available and Patient being monitored Patient Re-evaluated:Patient Re-evaluated prior to inductionOxygen Delivery Method: Circle system utilized Preoxygenation: Pre-oxygenation with 100% oxygen Intubation Type: IV induction Ventilation: Mask ventilation without difficulty and Oral airway inserted - appropriate to patient size Laryngoscope Size: Mac and 4 Grade View: Grade III Tube type: Oral Tube size: 7.5 mm Number of attempts: 3 Airway Equipment and Method: Stylet and Video-laryngoscopy Placement Confirmation: ETT inserted through vocal cords under direct vision,  positive ETCO2 and breath sounds checked- equal and bilateral Secured at: 23 cm Tube secured with: Tape Dental Injury: Teeth and Oropharynx as per pre-operative assessment  Difficulty Due To: Difficult Airway- due to anterior larynx Comments: DLx1 A. Nelda Marseille, CRNA epiglottis only visualized; DLx2 C. Oletta Lamas, MD epiglottis visualized. DL x 3 C. Edwards, MD with Glidescope Grade III view ETT passed.

## 2012-05-04 NOTE — Progress Notes (Signed)
Patient's preop assessment and all pre op questions completed by Majel Homer and not Cyndie Mull.

## 2012-05-04 NOTE — Transfer of Care (Signed)
Immediate Anesthesia Transfer of Care Note  Patient: Thomas David  Procedure(s) Performed: Procedure(s): RIGHT HEMI THYROIDECTOMY (Right)  Patient Location: PACU  Anesthesia Type:General  Level of Consciousness: awake, alert  and oriented  Airway & Oxygen Therapy: Patient Spontanous Breathing and Patient connected to nasal cannula oxygen  Post-op Assessment: Report given to PACU RN, Post -op Vital signs reviewed and stable and Patient moving all extremities  Post vital signs: Reviewed and stable  Complications: No apparent anesthesia complications

## 2012-05-04 NOTE — Anesthesia Preprocedure Evaluation (Addendum)
Anesthesia Evaluation  Patient identified by MRN, date of birth, ID band Patient awake    Reviewed: Allergy & Precautions, H&P , NPO status , Patient's Chart, lab work & pertinent test results  Airway Mallampati: II      Dental   Pulmonary neg pulmonary ROS,  breath sounds clear to auscultation        Cardiovascular hypertension, Pt. on medications Rhythm:Regular Rate:Normal     Neuro/Psych    GI/Hepatic Neg liver ROS, GERD-  ,  Endo/Other  negative endocrine ROS  Renal/GU Renal disease     Musculoskeletal   Abdominal   Peds  Hematology   Anesthesia Other Findings   Reproductive/Obstetrics                           Anesthesia Physical Anesthesia Plan  ASA: III  Anesthesia Plan: General   Post-op Pain Management:    Induction: Intravenous  Airway Management Planned: Oral ETT  Additional Equipment:   Intra-op Plan:   Post-operative Plan: Possible Post-op intubation/ventilation  Informed Consent: I have reviewed the patients History and Physical, chart, labs and discussed the procedure including the risks, benefits and alternatives for the proposed anesthesia with the patient or authorized representative who has indicated his/her understanding and acceptance.   Dental advisory given  Plan Discussed with: CRNA, Anesthesiologist and Surgeon  Anesthesia Plan Comments:        Anesthesia Quick Evaluation

## 2012-05-05 NOTE — Discharge Summary (Signed)
Physician Discharge Summary  Patient ID: CINCH ORMOND MRN: [id number] DOB/AGE: 75/26/1937 75 y.o.  Admit date: 05/04/2012 Discharge date: 05/05/2012  Admission Diagnoses: Right thyroid mass  Discharge Diagnoses: Right thyroid mass Active Problems:   * No active hospital problems. *   Discharged Condition: good  Hospital Course: Pt had an uneventful overnight stay. Pt tolerated po well. No bleeding. Voice is strong.  Consults: None  Significant Diagnostic Studies: None  Treatments: surgery: Right hemithyroidectomy  Discharge Exam: Blood pressure 141/76, pulse 78, temperature 98.2 F (36.8 C), temperature source Oral, resp. rate 18, height 5' 9"  (1.753 m), weight 108.092 kg (238 lb 4.8 oz), SpO2 94.00%. Incision/Wound: c/d/i Voice is strong JP removed.  Disposition: Final discharge disposition not confirmed  Discharge Orders   Future Appointments Provider Department Dept Phone   06/01/2012 8:00 PM [facility] [place] [facility] [phone]   Future Orders Complete By Expires     Diet general  As directed     Increase activity slowly  As directed         Medication List    STOP taking these medications       aspirin EC 81 MG tablet      TAKE these medications       allopurinol 300 MG tablet  Commonly known as:  ZYLOPRIM  Take 300 mg by mouth daily.     amLODipine 5 MG tablet  Commonly known as:  NORVASC  Take 5 mg by mouth daily.     fluticasone 50 MCG/ACT nasal spray  Commonly known as:  FLONASE  Place 2 sprays into the nose daily.     lisinopril 2.5 MG tablet  Commonly known as:  PRINIVIL,ZESTRIL  Take 2.5 mg by mouth daily.     mesalamine 500 MG CR capsule  Commonly known as:  PENTASA  Take 1,000 mg by mouth 2 (two) times daily.     metoprolol 50 MG tablet  Commonly known as:  LOPRESSOR  Take 100 mg by mouth daily.     omeprazole 20 MG capsule  Commonly known as:  PRILOSEC  Take 20 mg by mouth daily.     OVER THE  COUNTER MEDICATION  Take 1 packet by mouth daily. Xango 3sixty5 complex vitamins (multiple tablets in one packet)     oxyCODONE-acetaminophen 5-325 MG per tablet  Commonly known as:  ROXICET  Take 1 tablet by mouth every 4 (four) hours as needed for pain.     simvastatin 20 MG tablet  Commonly known as:  ZOCOR  Take 20 mg by mouth every evening.     tetrahydrozoline-zinc 0.05-0.25 % ophthalmic solution  Commonly known as:  VISINE-AC  Place 2 drops into both eyes 3 (three) times daily as needed (dry eyes).     vitamin B-12 1000 MCG tablet  Commonly known as:  CYANOCOBALAMIN  Take 1,000 mcg by mouth 2 (two) times daily.           Follow-up Information   Follow up with Ascencion Dike, MD In 1 week. (as scheduled)    Contact information:   [address] [address] [phone]       Signed: Ascencion Dike 05/05/2012, 8:06 AM

## 2012-05-05 NOTE — Op Note (Signed)
NAME:  Thomas David, Thomas David                 ACCOUNT NO.:  [id number]  MEDICAL RECORD NO.:  [id number]  LOCATION:  6N05C                        FACILITY:  [facility]  PHYSICIAN:  Leta Baptist, MD            DATE OF BIRTH:  19-Mar-1935  DATE OF PROCEDURE:  05/04/2012 DATE OF DISCHARGE:                              OPERATIVE REPORT   SURGEON:  Leta Baptist, MD  ASSISTANT:  Rometta Emery, PA-C  PREOPERATIVE DIAGNOSIS:  Right thyroid mass.  POSTOPERATIVE DIAGNOSIS:  Right thyroid mass.  PROCEDURE PERFORMED:  Right hemithyroidectomy.  ANESTHESIA:  General endotracheal tube anesthesia.  COMPLICATIONS:  None.  ESTIMATED BLOOD LOSS:  Less than 50 mL.  INDICATIONS FOR PROCEDURE:  The patient is a 77 year old male with a history of right thyroid mass.  The mass was noted on a chest CT scan, and the mass was noted to displace the trachea to the left side.  The size of the thyroid nodule has increased over time.  The patient underwent an ultrasound-guided fine-needle aspiration biopsy of his right thyroid nodule.  The findings were consistent with non-neoplastic goiter with cystic degenerative changes.  However, due to the size of the nodule, and its compressive effect on his trachea and airway, the patient would like to have the nodule removed.  The risks, benefits, alternatives, and details of the procedure were discussed with the patient.  Questions were invited and answered.  Informed consent was obtained.  DESCRIPTION OF PROCEDURE:  The patient was taken to the operating room and placed supine on the operating table.  General endotracheal tube anesthesia was administered by an anesthesiologist.  The patient was positioned and prepped and draped in a standard fashion for right hemithyroidectomy.  A 1% lidocaine with 1:100,000 epinephrine was injected at the planned site of incisions.  A curved transverse anterior neck incision was made at the level of the thyroid gland.  The incision was carried  down to the level of the platysma muscles.  Both superior and inferior subplatysmal flaps were elevated in a standard fashion. The strap muscles were divided at midline and retracted laterally, exposing the thyroid gland.  Examination of thyroid gland shows a large 3.5-cm inferior thyroid nodule.  Careful dissection was performed to free the right thyroid lobe from the surrounding soft tissue.  The superior thyroid vessels and the middle thyroid vein were both ligated and divided.  The right recurrent laryngeal nerve was also identified and preserved.  The nerve was noted to be functional when stimulated with a stimulator.  The entire right thyroid lobe, together with the nodule were then resected free.  The entire specimen was sent to the Pathology Department for frozen section analysis.  The frozen section results were consistent with benign thyroid nodule.  The surgical site was copiously irrigated.  A #10 JP drain was placed.  The incision was closed in layers with 4-0 Vicryl and Dermabond.  That concluded procedure for the patient.  The care of the patient was turned over to the anesthesiologist.  The patient was awakened from anesthesia without difficulty.  He was extubated and transferred to the recovery room in good condition.  OPERATIVE FINDINGS:  Large 3.5-cm right inferior thyroid nodule was noted.  The entire right thyroid lobe was resected and sent to the Pathology Department.  The right recurrent laryngeal nerve was identified and preserved.  SPECIMEN:  Right thyroid lobe.  FOLLOWUP CARE:  The patient will be observed overnight in the hospital. He will most likely be discharged home on postop day #1.     Leta Baptist, MD     ST/MEDQ  D:  05/04/2012  T:  05/05/2012  Job:  [id number]  cc:   Nicki Reaper A. Wolfgang Phoenix, MD

## 2012-05-05 NOTE — Progress Notes (Addendum)
Patient has had 3-4 incontinent episodes of urine tonight and denies having any incontinence prior to admission. Otherwise he has had an unremarkable night, pain controlled on po pain meds minimal drainage from jp drain.

## 2012-05-06 ENCOUNTER — Encounter (HOSPITAL_COMMUNITY): Payer: Self-pay | Admitting: Otolaryngology

## 2012-05-23 ENCOUNTER — Telehealth: Payer: Self-pay | Admitting: Family Medicine

## 2012-05-23 ENCOUNTER — Telehealth (INDEPENDENT_AMBULATORY_CARE_PROVIDER_SITE_OTHER): Payer: Self-pay | Admitting: *Deleted

## 2012-05-23 NOTE — Telephone Encounter (Signed)
He should be seen here first. I recommend the patient set up for regular office visit for later this week.

## 2012-05-23 NOTE — Telephone Encounter (Signed)
Wants to talk Dr. Nicki Reaper or Nurse regarding health issues.  May need to go to a Urologist.  Please call as soon as possible.!

## 2012-05-23 NOTE — Telephone Encounter (Signed)
Patient notified. Transferred to front desk to schedule appointment.

## 2012-05-23 NOTE — Telephone Encounter (Signed)
Patient states that he had a thyroidectomy done on 05/04/2012 by Dr. Benjamine Mola. He states that the surgery was a success but he has been having trouble controlling his urine since the surgery. He wants to know if you want to see him or do you recommend he just go ahead and he referred to a urologist.

## 2012-05-23 NOTE — Telephone Encounter (Signed)
I have spoken with patient. He will call with a PR Wednesday.

## 2012-05-23 NOTE — Telephone Encounter (Signed)
Thomas David stating he had surgery 2 weeks ago and has caused his Crohn's to flare up. Would like for someone to please return his call at [phone].

## 2012-05-25 ENCOUNTER — Encounter: Payer: Self-pay | Admitting: *Deleted

## 2012-05-25 ENCOUNTER — Telehealth (INDEPENDENT_AMBULATORY_CARE_PROVIDER_SITE_OTHER): Payer: Self-pay | Admitting: *Deleted

## 2012-05-25 NOTE — Telephone Encounter (Signed)
Thomas David had thyroid surgery on 05/04/12. His system has not got back in shape, didn't go to the bathroom the first week, diarrhea week 2 and now having 6 to 8 loss stools daily. Thomas David did said he is gradually improving, also he doubled up on his Pentasa. Has an apt with PCP on 05/26/12 for trouble urinating. He is having to sit down to urinate. Would like to speak with Dr. Laural Golden if he would please return his call to [phone] or [phone].

## 2012-05-26 ENCOUNTER — Encounter: Payer: Self-pay | Admitting: Family Medicine

## 2012-05-26 ENCOUNTER — Ambulatory Visit (INDEPENDENT_AMBULATORY_CARE_PROVIDER_SITE_OTHER): Payer: Medicare Other | Admitting: Family Medicine

## 2012-05-26 VITALS — BP 138/82 | Wt 229.0 lb

## 2012-05-26 DIAGNOSIS — N400 Enlarged prostate without lower urinary tract symptoms: Secondary | ICD-10-CM | POA: Insufficient documentation

## 2012-05-26 DIAGNOSIS — R300 Dysuria: Secondary | ICD-10-CM

## 2012-05-26 LAB — POCT URINALYSIS DIPSTICK: pH, UA: 6

## 2012-05-26 MED ORDER — TAMSULOSIN HCL 0.4 MG PO CAPS: 0.4000 mg | ORAL_CAPSULE | Freq: Every day | ORAL | Status: DC

## 2012-05-26 MED ORDER — CIPROFLOXACIN HCL 500 MG PO TABS: 500.0000 mg | ORAL_TABLET | Freq: Two times a day (BID) | ORAL | Status: DC

## 2012-05-26 NOTE — Patient Instructions (Addendum)
In urine flow stops go to er. Use Cipro 500 one twice a day flomax twice a day for 2 days then daily  follow up in 1 week

## 2012-05-26 NOTE — Telephone Encounter (Signed)
Dr.Rehman made aware.

## 2012-05-26 NOTE — Progress Notes (Signed)
No to do soa day for her rheumatoid is sleeping a lot Subjective:    Patient ID: Thomas David, male    DOB: 09/13/1935, 77 y.o.   MRN: [id number]  HPI Patient with significant problems with urination. Has hard time getting the flow going. This started up after having recent surgery. He tried Flomax although he is still having problems he stopped Flomax a few days ago. He denies any sweats chills fever. Does feel very full down in his lower abdomen. No nausea vomiting. He has had some diarrhea and some constipation related to his Crohn's disease and recent surgery. No wheezing chest tightness or shortness of breath.   Review of Systems see above.      Objective:   Physical Exam Vital signs stable. Lungs are clear. Heart is regular. Pulses normal abdomen is soft has enlarged prostate very tender on the abdominal exam he has an enlarged bladder.       Assessment & Plan:  Prostatitis-Cipro 500 twice a day for the next 3 weeks, Flomax I would do it twice a day for the next couple days if his urine or if he has problems he needs immediately go to the ER. May need urology referral. In addition to this I recommend followup within one to 2 weeks time to recheck him. Warning signs were discussed.

## 2012-05-31 NOTE — Telephone Encounter (Signed)
Patient advised to take Pentasa 3 or 4 g per day. He can take Imodium 1-2 mg every morning for the next week or two He will call us if symptoms persist or worsen

## 2012-06-02 ENCOUNTER — Encounter: Payer: Self-pay | Admitting: Family Medicine

## 2012-06-02 ENCOUNTER — Ambulatory Visit (INDEPENDENT_AMBULATORY_CARE_PROVIDER_SITE_OTHER): Payer: Medicare Other | Admitting: Family Medicine

## 2012-06-02 VITALS — BP 132/70 | Temp 98.1°F | Ht 68.0 in | Wt 225.5 lb

## 2012-06-02 DIAGNOSIS — R339 Retention of urine, unspecified: Secondary | ICD-10-CM

## 2012-06-02 DIAGNOSIS — N400 Enlarged prostate without lower urinary tract symptoms: Secondary | ICD-10-CM

## 2012-06-02 DIAGNOSIS — N401 Enlarged prostate with lower urinary tract symptoms: Secondary | ICD-10-CM | POA: Diagnosis not present

## 2012-06-02 LAB — POCT URINALYSIS DIPSTICK
Blood, UA: 250
Protein, UA: 500
Spec Grav, UA: 1.02
pH, UA: 6

## 2012-06-02 NOTE — Progress Notes (Signed)
  Subjective:    Patient ID: Thomas David, male    DOB: 01-May-1935, 77 y.o.   MRN: [id number]  HPI This patient comes in today he is having some issues with urinary retention. We had seen him recently for this it seemed to have started after a surgery episode. Possibly general anesthesia causing some problems. I placed him on Flomax twice daily at that time when we saw him a week and a half ago when it seemed to help. In addition to this on exam on previous office visit prostate was enlarged and tender and he was placed on Cipro 500 twice a day for prostatitis 21 days. but then recently his gastroenterologist told him to start taking Imodium for his Crohn's disease related diarrhea in this aggravated the urinary retention he comes in today with suprapubic discomfort denies fevers chills sweats.  Past medical history hypertension Crohn's reflux hyperlipidemia. Does not smoke or drink   Review of Systemssee above     Objective:   Physical Exam Lungs are clear hearts regular abdomen soft enlargement of the bladder is noted extremities no edema skin warm dry       Assessment & Plan:  Urinary retention-continue Cipro twice daily, increase Flomax to twice daily, urgent referral to urology. Will need catheter with leg bag. Patient is going on a trip in a couple weeks does not want to be on a leg bag long term. He is hesitant regarding the possibility of surgery.

## 2012-06-09 DIAGNOSIS — N401 Enlarged prostate with lower urinary tract symptoms: Secondary | ICD-10-CM | POA: Diagnosis not present

## 2012-06-09 DIAGNOSIS — R339 Retention of urine, unspecified: Secondary | ICD-10-CM | POA: Diagnosis not present

## 2012-06-15 ENCOUNTER — Encounter (INDEPENDENT_AMBULATORY_CARE_PROVIDER_SITE_OTHER): Payer: Self-pay | Admitting: *Deleted

## 2012-06-15 DIAGNOSIS — R339 Retention of urine, unspecified: Secondary | ICD-10-CM | POA: Diagnosis not present

## 2012-06-21 DIAGNOSIS — R339 Retention of urine, unspecified: Secondary | ICD-10-CM | POA: Diagnosis not present

## 2012-06-21 DIAGNOSIS — N401 Enlarged prostate with lower urinary tract symptoms: Secondary | ICD-10-CM | POA: Diagnosis not present

## 2012-06-22 ENCOUNTER — Other Ambulatory Visit: Payer: Self-pay | Admitting: Urology

## 2012-06-29 ENCOUNTER — Encounter (HOSPITAL_COMMUNITY): Payer: Self-pay | Admitting: Pharmacy Technician

## 2012-06-29 ENCOUNTER — Ambulatory Visit (INDEPENDENT_AMBULATORY_CARE_PROVIDER_SITE_OTHER): Payer: Medicare Other | Admitting: Internal Medicine

## 2012-07-04 ENCOUNTER — Other Ambulatory Visit (HOSPITAL_COMMUNITY): Payer: Self-pay | Admitting: Urology

## 2012-07-04 NOTE — Progress Notes (Signed)
ekg 04-28-2012 epic Chest 2 view xray 04-28-2012 epic

## 2012-07-04 NOTE — Patient Instructions (Addendum)
[place]  07/04/2012   Your procedure is scheduled on: 07-11-2012  Report to [facility] at 530 AM.  Call this number if you have problems the morning of surgery [phone]   Remember: call dr Sallee Lange and get rash looked at if not improved by tomorrow 07-06-2012   Do not eat food or drink liquids :After Midnight.     Take these medicines the morning of surgery with A SIP OF WATER: amlodpine, omeprazole, visine eye drop if needed                                SEE [place]   Do not wear jewelry, make-up or nail polish.  Do not wear lotions, powders, or perfumes. You may wear deodorant.   Men may shave face and neck.  Do not bring valuables to the hospital.  Contacts, dentures or bridgework may not be worn into surgery.  Leave suitcase in the car. After surgery it may be brought to your room.  For patients admitted to the hospital, checkout time is 11:00 AM the day of discharge.   Patients discharged the day of surgery will not be allowed to drive home.  Name and phone number of your driver:  Special Instructions: N/A   Please read over the following fact sheets that you were given: MRSA Information.  Call Zelphia Cairo RN pre op nurse if needed [phone]    FAILURE TO FOLLOW THESE INSTRUCTIONS MAY RESULT IN THE CANCELLATION OF YOUR SURGERY. PATIENT SIGNATURE___________________________________________

## 2012-07-05 ENCOUNTER — Encounter (HOSPITAL_COMMUNITY)
Admission: RE | Admit: 2012-07-05 | Discharge: 2012-07-05 | Disposition: A | Discharge status: Home / Self Care | Payer: Medicare Other | Source: Ambulatory Visit | Attending: Urology | Admitting: Urology

## 2012-07-05 ENCOUNTER — Encounter (HOSPITAL_COMMUNITY): Payer: Self-pay

## 2012-07-05 DIAGNOSIS — R32 Unspecified urinary incontinence: Secondary | ICD-10-CM | POA: Diagnosis not present

## 2012-07-05 DIAGNOSIS — R339 Retention of urine, unspecified: Secondary | ICD-10-CM | POA: Diagnosis not present

## 2012-07-05 DIAGNOSIS — N139 Obstructive and reflux uropathy, unspecified: Secondary | ICD-10-CM | POA: Diagnosis not present

## 2012-07-05 DIAGNOSIS — Z8739 Personal history of other diseases of the musculoskeletal system and connective tissue: Secondary | ICD-10-CM | POA: Diagnosis not present

## 2012-07-05 DIAGNOSIS — N401 Enlarged prostate with lower urinary tract symptoms: Secondary | ICD-10-CM | POA: Diagnosis not present

## 2012-07-05 DIAGNOSIS — Z862 Personal history of diseases of the blood and blood-forming organs and certain disorders involving the immune mechanism: Secondary | ICD-10-CM | POA: Diagnosis not present

## 2012-07-05 DIAGNOSIS — R351 Nocturia: Secondary | ICD-10-CM | POA: Diagnosis not present

## 2012-07-05 DIAGNOSIS — N390 Urinary tract infection, site not specified: Secondary | ICD-10-CM | POA: Diagnosis not present

## 2012-07-05 DIAGNOSIS — E7800 Pure hypercholesterolemia, unspecified: Secondary | ICD-10-CM | POA: Diagnosis not present

## 2012-07-05 DIAGNOSIS — Z79899 Other long term (current) drug therapy: Secondary | ICD-10-CM | POA: Diagnosis not present

## 2012-07-05 HISTORY — DX: Personal history of urinary calculi: Z87.442

## 2012-07-05 LAB — BASIC METABOLIC PANEL
BUN: 14 mg/dL (ref 6–23)
CO2: 27 mEq/L (ref 19–32)
Calcium: 9.4 mg/dL (ref 8.4–10.5)
Chloride: 103 mEq/L (ref 96–112)
Creatinine, Ser: 0.86 mg/dL (ref 0.50–1.35)
GFR calc Af Amer: >90 mL/min (ref >90)
GFR calc non Af Amer: 83 mL/min — ABNORMAL LOW (ref >90)
Glucose, Bld: 110 mg/dL — ABNORMAL HIGH (ref 70–99)
Potassium: 4.4 mEq/L (ref 3.5–5.1)
Sodium: 139 mEq/L (ref 135–145)

## 2012-07-05 LAB — CBC
HCT: 38.9 % — ABNORMAL LOW (ref 39.0–52.0)
Hemoglobin: 12.9 g/dL — ABNORMAL LOW (ref 13.0–17.0)
MCH: 28.7 pg (ref 26.0–34.0)
MCHC: 33.2 g/dL (ref 30.0–36.0)
MCV: 86.6 fL (ref 78.0–100.0)
Platelets: 201 10*3/uL (ref 150–400)
RBC: 4.49 MIL/uL (ref 4.22–5.81)
RDW: 14.3 % (ref 11.5–15.5)
WBC: 7.6 10*3/uL (ref 4.0–10.5)

## 2012-07-05 LAB — SURGICAL PCR SCREEN
MRSA, PCR: NEGATIVE
Staphylococcus aureus: NEGATIVE

## 2012-07-05 NOTE — Progress Notes (Signed)
Pt right arm rash since 07-03-2012, pt instructed to call dr Nicki Reaper luking if rash not healing by 07-06-2012 and call this rn and update if dr Wolfgang Phoenix sees patient about rash.

## 2012-07-06 ENCOUNTER — Encounter: Payer: Self-pay | Admitting: Family Medicine

## 2012-07-06 ENCOUNTER — Other Ambulatory Visit: Payer: Self-pay | Admitting: *Deleted

## 2012-07-06 ENCOUNTER — Ambulatory Visit (INDEPENDENT_AMBULATORY_CARE_PROVIDER_SITE_OTHER): Payer: Medicare Other | Admitting: Family Medicine

## 2012-07-06 VITALS — BP 132/78 | Temp 98.2°F | Wt 229.4 lb

## 2012-07-06 DIAGNOSIS — R21 Rash and other nonspecific skin eruption: Secondary | ICD-10-CM | POA: Diagnosis not present

## 2012-07-06 DIAGNOSIS — L259 Unspecified contact dermatitis, unspecified cause: Secondary | ICD-10-CM | POA: Diagnosis not present

## 2012-07-06 MED ORDER — KETOCONAZOLE 2 % EX CREA: TOPICAL_CREAM | Freq: Two times a day (BID) | CUTANEOUS | Status: DC

## 2012-07-06 MED ORDER — DOXYCYCLINE HYCLATE 100 MG PO CAPS: 100.0000 mg | ORAL_CAPSULE | Freq: Two times a day (BID) | ORAL | Status: DC

## 2012-07-06 NOTE — Patient Instructions (Signed)
Phillips colon health probiotic , take one daily

## 2012-07-06 NOTE — Progress Notes (Signed)
  Subjective:    Patient ID: Thomas David, male    DOB: 1935-12-25, 77 y.o.   MRN: [id number]  Rash This is a recurrent problem. The current episode started in the past 7 days. The problem is unchanged. The affected locations include the left axilla and right axilla. The rash is characterized by redness and itchiness. Past treatments include anti-itch cream. The treatment provided mild relief.   He states he's had this rash before, the irritating slightly sore and middle get better after week or 2 but he has surgery coming up next week. Going to have transurethral surgery for BPH. He denies any severe pain discomfort more itching and burning. He denies any fevers chills sweats. No other problems this up underneath the right arm and a minimal amount underneath the left arm. He knows of nothing that has triggered it. He does have a history of Crohn's disease denies any bleeding or fevers. Family history noncontributory.  Review of Systems  Skin: Positive for rash.  No difficulty breathing no chest tightness pressure pain or fevers     Objective:   Physical Exam Lungs are clear, heart is regular, he has erythematous rash underneath the right arm that looks more like a contact dermatitis it does have some follicular component but no abscess with it minimal tenderness. A culture was taken of this area. It is not obviously a MRSA infection.       Assessment & Plan:  Rash-I. Feel that this most likely a contact allergen but we will go ahead and cover for the possibility of yeast as well as staph aureus. Doxycycline twice a day for 5 days. Culture was taken await the results. In addition to this she did time as all twice a day for the next several days, if this is not doing dramatically better then may need to consider delaying the surgery. But currently right now I do not feel this is a staph infection and I believe it would be safe for him to have his surgery.

## 2012-07-07 NOTE — Progress Notes (Signed)
Pt called saw dr Wolfgang Phoenix about right arm rash, started on doxycycline and nizoral cream, told to take [facility] colon health po also.

## 2012-07-08 ENCOUNTER — Telehealth: Payer: Self-pay | Admitting: Family Medicine

## 2012-07-08 NOTE — Telephone Encounter (Signed)
I would not recommend taking at the same setting, make sure no fever, may want to check BP, hows rash doing?

## 2012-07-08 NOTE — Telephone Encounter (Signed)
Culture showed yeast, stop doxy, use ketoconazole as directed, good for surgery

## 2012-07-08 NOTE — Telephone Encounter (Signed)
Notified patient culture showed yeast, stop doxy, use ketoconazole as directed, good for surgery. Patient verbalized understanding.

## 2012-07-08 NOTE — Telephone Encounter (Signed)
[facility] Dr Loel Lofty prescribed pt some Nitrofurantion mono  159m, pt was advised by [facility] to take both meds. He says he now feels faint, sweating a lot, etc. He wants to know if you think he should be taking both this med and the Doxycycline that you prescribed him? [phone]

## 2012-07-08 NOTE — Telephone Encounter (Signed)
Notified patient recommend taking at the same setting. Patient states he is not running fever and the rash is doing a lot better.

## 2012-07-10 DIAGNOSIS — R338 Other retention of urine: Secondary | ICD-10-CM | POA: Diagnosis present

## 2012-07-10 DIAGNOSIS — N401 Enlarged prostate with lower urinary tract symptoms: Secondary | ICD-10-CM | POA: Diagnosis present

## 2012-07-10 NOTE — H&P (Signed)
[name] is a 77 year old male who has developed urinary retention.   History of Present Illness     AUR: He had thyroid surgery on 05/04/12 and postoperatively he did not have a catheter but was having dribbling incontinence. As a baseline he does have some obstructive symptoms with nocturia 2-3 times but reports that he typically voids with a good stream. He was started on tamsulosin.  He has a past urologic history of having had a UTI and was seen by Dr. Terance David for this in 2005.  Interval Hx: With dribbling incontinence and a PVR of 300 cc I recommended Foley catheterization and increased his tamsulosin to BID. He is tolerating his catheter.   Past Medical History Problems  1. History of  Arthritis V13.4 2. History of  Gout 274.9 3. History of  Hypercholesterolemia 272.0  Surgical History Problems  1. History of  Hemorrhoidectomy 2. History of  Small Bowel Resection 3. History of  Thyroid Surgery Substernal Thyroidectomy Partial Right  Current Meds 1. Allopurinol 300 MG Oral Tablet; Therapy: [id number] to 2. AmLODIPine Besylate 5 MG Oral Tablet; Therapy: [id number] to 3. Aspirin Low Dose 81 MG Oral Tablet; Therapy: (Recorded:01May2014) to 4. Fluticasone Propionate 50 MCG/ACT Nasal Suspension; Therapy: [id number] to 5. Lisinopril 2.5 MG Oral Tablet; Therapy: [id number] to 6. Loperamide A-D TABS; Therapy: (Recorded:01May2014) to 7. Metoprolol Tartrate 50 MG Oral Tablet; Therapy: 17Sep2013 to 8. Omeprazole 20 MG Oral Capsule Delayed Release; Therapy: [phone] to 9. Oxycodone-Acetaminophen 5-325 MG Oral Tablet; Therapy: 02Apr2014 to 10. Pentasa 500 MG Oral Capsule Extended Release; Therapy: [id number] to 11. Simvastatin 20 MG Oral Tablet; Therapy: [id number] to 12. Tamsulosin HCl 0.4 MG Oral Capsule; Therapy: 24Apr2014 to  Allergies Medication  1. No Known Drug Allergies  Family History Problems  1. Family history of  Diabetes Mellitus V18.0 2. Family history of  Hypertension  V17.49  Social History Problems  1. Marital History - Currently Married 2. Never A Smoker Denied  3. History of  Alcohol Use 4. History of  Caffeine Use  Review of Systems Genitourinary, constitutional, skin, eye, otolaryngeal, hematologic/lymphatic, cardiovascular, pulmonary, endocrine, musculoskeletal, gastrointestinal, neurological and psychiatric system(s) were reviewed and pertinent findings if present are noted.  Genitourinary: feelings of urinary urgency, dysuria, nocturia, incontinence, difficulty starting the urinary stream, urinary stream starts and stops and initiating urination requires straining, but no urinary frequency.  Gastrointestinal: diarrhea.  Constitutional: feeling tired (fatigue).  Musculoskeletal: back pain.    Vitals Vital Signs  BMI Calculated: 33.33 BSA Calculated: 2.17 Height: 5 ft 9 in Weight: 225 lb  Blood Pressure: 140 / 78  Blood Pressure: 134 / 77 Temperature: 97.3 F Heart Rate: 72  Physical Exam Constitutional: Well nourished and well developed . No acute distress.  ENT:. The ears and nose are normal in appearance.  Neck: The appearance of the neck is normal and no neck mass is present.  Pulmonary: No respiratory distress and normal respiratory rhythm and effort.  Cardiovascular: Heart rate and rhythm are normal . No peripheral edema.  Abdomen: The abdomen is soft and nontender. No masses are palpated. Mild suprapubic tenderness is present. No CVA tenderness. No hernias are palpable. No hepatosplenomegaly noted.  Rectal: Rectal exam demonstrates normal sphincter tone, no tenderness and no masses. The prostate has no nodularity and is not tender. The left seminal vesicle is nonpalpable. The right seminal vesicle is nonpalpable. The perineum is normal on inspection.  Genitourinary: Examination of the penis demonstrates an indwelling catheter, but no discharge, no masses, no lesions  and a normal meatus. The penis is uncircumcised. The scrotum is  without lesions. The right epididymis is palpably normal and non-tender. The left epididymis is palpably normal and non-tender. The right testis is non-tender and without masses. The left testis is non-tender and without masses.  Lymphatics: The femoral and inguinal nodes are not enlarged or tender.  Skin: Normal skin turgor, no visible rash and no visible skin lesions.  Neuro/Psych:. Mood and affect are appropriate.  Results/Data  The following images/tracing/specimen were independently visualized:  Flow rate as below.    Procedure  Procedure: Cystoscopy   Informed Consent: Risks, benefits, and potential adverse events were discussed and informed consent was obtained from the patient.  Prep: The patient was prepped with betadine.  Procedure Note:  Urethral meatus:. No abnormalities.  Anterior urethra: No abnormalities.  Prostatic urethra: No abnormalities . There was visual obstruction of the prostatic urethra. The lateral prostatic lobes were enlarged. No intravesical median lobe was visualized.  Bladder: Visulization was clear. The ureteral orifices were in the normal anatomic position bilaterally and had clear efflux of urine. A systematic survey of the bladder demonstrated no bladder tumors or stones. The mucosa was smooth without abnormalities. Examination of the bladder demonstrated moderate trabeculation. The patient tolerated the procedure well.  Complications: None.    Assessment  Assessed  1. Benign Prostatic Hypertrophy With Urinary Obstruction 600.01   He initially underwent a voiding trial but was unsuccessful. I therefore proceeded with cystoscopy with the hope that I could fill his bladder further and he would have a successful voiding trial.  Cystoscopically I found an elongated prostatic urethra with bilobar hypertrophy and trabeculation of the bladder consistent with outlet obstruction. No lesions were found within the bladder. There was some irritation from the  catheter as to be expected. I was able to put more water in his bladder and he felt as if he could void. A repeat attempt at voiding trial however was unsuccessful. A new Foley catheter was therefore reinserted.  We discussed the options which include continued observation with repeat voiding trials on maximum medical management including finasteride, intermittent self-catheterization and transurethral resection of the prostate. His prostate fairly large side in think he would be a good candidate for laser ablation. We therefore discussed TURP in detail. I've gone over the procedure with him including its risks and complications. We discussed the hospital and post hospital course anticipated. He understands about the development of retrograde ejaculation. I will have him leave a urine for culture one week prior to the procedure so I can have him on appropriate antibiotics if necessary.    Plan  Benign Prostatic Hypertrophy With Urinary Obstruction (600.01), Acute Urinary Retention (788.20)     1. A new Foley catheter was reinserted. 2. He will be scheduled for transurethral resection of his prostate.  3.  His urine culture grew staph sensitive to nitrofurantoin and he was placed on this preoperatively.  It was also sensitive to gentamicin which he will receive the morning of his surgery.

## 2012-07-11 ENCOUNTER — Ambulatory Visit (HOSPITAL_COMMUNITY): Payer: Medicare Other | Admitting: Anesthesiology

## 2012-07-11 ENCOUNTER — Encounter (HOSPITAL_COMMUNITY): Payer: Self-pay | Admitting: Anesthesiology

## 2012-07-11 ENCOUNTER — Encounter (HOSPITAL_COMMUNITY): Payer: Self-pay | Admitting: *Deleted

## 2012-07-11 ENCOUNTER — Ambulatory Visit (HOSPITAL_COMMUNITY)
Admission: RE | Admit: 2012-07-11 | Discharge: 2012-07-12 | Disposition: A | Discharge status: Home / Self Care | Payer: Medicare Other | Source: Ambulatory Visit | Attending: Urology | Admitting: Urology

## 2012-07-11 ENCOUNTER — Encounter (HOSPITAL_COMMUNITY)
Admission: RE | Disposition: A | Discharge status: Home / Self Care | Payer: Self-pay | Source: Ambulatory Visit | Attending: Urology

## 2012-07-11 DIAGNOSIS — Z862 Personal history of diseases of the blood and blood-forming organs and certain disorders involving the immune mechanism: Secondary | ICD-10-CM | POA: Insufficient documentation

## 2012-07-11 DIAGNOSIS — N139 Obstructive and reflux uropathy, unspecified: Secondary | ICD-10-CM | POA: Diagnosis not present

## 2012-07-11 DIAGNOSIS — N138 Other obstructive and reflux uropathy: Secondary | ICD-10-CM | POA: Insufficient documentation

## 2012-07-11 DIAGNOSIS — N401 Enlarged prostate with lower urinary tract symptoms: Secondary | ICD-10-CM | POA: Insufficient documentation

## 2012-07-11 DIAGNOSIS — K5090 Crohn's disease, unspecified, without complications: Secondary | ICD-10-CM | POA: Diagnosis not present

## 2012-07-11 DIAGNOSIS — E7800 Pure hypercholesterolemia, unspecified: Secondary | ICD-10-CM | POA: Insufficient documentation

## 2012-07-11 DIAGNOSIS — R339 Retention of urine, unspecified: Secondary | ICD-10-CM | POA: Insufficient documentation

## 2012-07-11 DIAGNOSIS — K219 Gastro-esophageal reflux disease without esophagitis: Secondary | ICD-10-CM | POA: Diagnosis not present

## 2012-07-11 DIAGNOSIS — R32 Unspecified urinary incontinence: Secondary | ICD-10-CM | POA: Insufficient documentation

## 2012-07-11 DIAGNOSIS — Z8639 Personal history of other endocrine, nutritional and metabolic disease: Secondary | ICD-10-CM | POA: Insufficient documentation

## 2012-07-11 DIAGNOSIS — R338 Other retention of urine: Secondary | ICD-10-CM | POA: Diagnosis present

## 2012-07-11 DIAGNOSIS — Z79899 Other long term (current) drug therapy: Secondary | ICD-10-CM | POA: Diagnosis not present

## 2012-07-11 DIAGNOSIS — R351 Nocturia: Secondary | ICD-10-CM | POA: Insufficient documentation

## 2012-07-11 DIAGNOSIS — Z8739 Personal history of other diseases of the musculoskeletal system and connective tissue: Secondary | ICD-10-CM | POA: Insufficient documentation

## 2012-07-11 HISTORY — PX: TRANSURETHRAL PROSTATECTOMY WITH GYRUS INSTRUMENTS: SHX6153

## 2012-07-11 SURGERY — TRANSURETHRAL PROSTATECTOMY WITH GYRUS INSTRUMENTS
Anesthesia: General | Wound class: Clean Contaminated

## 2012-07-11 MED ORDER — SODIUM CHLORIDE 0.9 % IV SOLN
INTRAVENOUS | Status: DC
  Administered 2012-07-11 (×2): via INTRAVENOUS

## 2012-07-11 MED ORDER — ACETAMINOPHEN 10 MG/ML IV SOLN: 1000.0000 mg | Freq: Once | INTRAVENOUS | Status: DC | PRN

## 2012-07-11 MED ORDER — PROMETHAZINE HCL 25 MG/ML IJ SOLN: 6.2500 mg | INTRAMUSCULAR | Status: DC | PRN

## 2012-07-11 MED ORDER — OXYCODONE HCL 5 MG PO TABS: 5.0000 mg | ORAL_TABLET | Freq: Once | ORAL | Status: DC | PRN

## 2012-07-11 MED ORDER — CIPROFLOXACIN IN D5W 400 MG/200ML IV SOLN
400.0000 mg | INTRAVENOUS | Status: AC
  Administered 2012-07-11: 400 mg via INTRAVENOUS

## 2012-07-11 MED ORDER — BELLADONNA ALKALOIDS-OPIUM 16.2-60 MG RE SUPP: 1.0000 | Freq: Four times a day (QID) | RECTAL | Status: DC | PRN

## 2012-07-11 MED ORDER — HYDROCODONE-ACETAMINOPHEN 5-325 MG PO TABS: 1.0000 | ORAL_TABLET | ORAL | Status: DC | PRN

## 2012-07-11 MED ORDER — FENTANYL CITRATE 0.05 MG/ML IJ SOLN
INTRAMUSCULAR | Status: DC | PRN
  Administered 2012-07-11: 50 ug via INTRAVENOUS
  Administered 2012-07-11: 25 ug via INTRAVENOUS

## 2012-07-11 MED ORDER — SODIUM CHLORIDE 0.9 % IR SOLN
Status: DC | PRN
  Administered 2012-07-11: 15000 mL via INTRAVESICAL

## 2012-07-11 MED ORDER — EPHEDRINE SULFATE 50 MG/ML IJ SOLN
INTRAMUSCULAR | Status: DC | PRN
  Administered 2012-07-11: 10 mg via INTRAVENOUS

## 2012-07-11 MED ORDER — ONDANSETRON HCL 4 MG/2ML IJ SOLN
INTRAMUSCULAR | Status: DC | PRN
  Administered 2012-07-11: 4 mg via INTRAVENOUS

## 2012-07-11 MED ORDER — CIPROFLOXACIN IN D5W 400 MG/200ML IV SOLN
INTRAVENOUS | Status: AC
  Filled 2012-07-11: qty 200

## 2012-07-11 MED ORDER — SODIUM CHLORIDE 0.9 % IR SOLN
3000.0000 mL | Status: DC
  Administered 2012-07-11 – 2012-07-12 (×2): 3000 mL

## 2012-07-11 MED ORDER — HYDROMORPHONE HCL PF 1 MG/ML IJ SOLN: 0.2500 mg | INTRAMUSCULAR | Status: DC | PRN

## 2012-07-11 MED ORDER — LACTATED RINGERS IV SOLN
INTRAVENOUS | Status: DC | PRN
  Administered 2012-07-11: 07:00:00 via INTRAVENOUS

## 2012-07-11 MED ORDER — ZOLPIDEM TARTRATE 5 MG PO TABS: 5.0000 mg | ORAL_TABLET | Freq: Every evening | ORAL | Status: DC | PRN

## 2012-07-11 MED ORDER — GENTAMICIN SULFATE 40 MG/ML IJ SOLN
5.0000 mg/kg | INTRAMUSCULAR | Status: AC
  Administered 2012-07-11 – 2012-07-12 (×2): 420 mg via INTRAVENOUS
  Filled 2012-07-11 (×3): qty 10.5

## 2012-07-11 MED ORDER — MEPERIDINE HCL 50 MG/ML IJ SOLN: 6.2500 mg | INTRAMUSCULAR | Status: DC | PRN

## 2012-07-11 MED ORDER — BACITRACIN-NEOMYCIN-POLYMYXIN 400-5-5000 EX OINT: 1.0000 "application " | TOPICAL_OINTMENT | Freq: Three times a day (TID) | CUTANEOUS | Status: DC | PRN

## 2012-07-11 MED ORDER — PROPOFOL 10 MG/ML IV BOLUS
INTRAVENOUS | Status: DC | PRN
  Administered 2012-07-11: 200 mg via INTRAVENOUS

## 2012-07-11 MED ORDER — ACETAMINOPHEN 325 MG PO TABS: 650.0000 mg | ORAL_TABLET | ORAL | Status: DC | PRN

## 2012-07-11 MED ORDER — CIPROFLOXACIN IN D5W 400 MG/200ML IV SOLN
400.0000 mg | Freq: Two times a day (BID) | INTRAVENOUS | Status: AC
  Administered 2012-07-11 – 2012-07-12 (×2): 400 mg via INTRAVENOUS
  Filled 2012-07-11 (×2): qty 200

## 2012-07-11 MED ORDER — LIDOCAINE HCL (CARDIAC) 20 MG/ML IV SOLN
INTRAVENOUS | Status: DC | PRN
  Administered 2012-07-11: 50 mg via INTRAVENOUS

## 2012-07-11 MED ORDER — ONDANSETRON HCL 4 MG/2ML IJ SOLN: 4.0000 mg | INTRAMUSCULAR | Status: DC | PRN

## 2012-07-11 MED ORDER — OXYCODONE HCL 5 MG/5ML PO SOLN
5.0000 mg | Freq: Once | ORAL | Status: DC | PRN
  Filled 2012-07-11: qty 5

## 2012-07-11 SURGICAL SUPPLY — 26 items
BAG URINE DRAINAGE (UROLOGICAL SUPPLIES) ×2 IMPLANT
BAG URO CATCHER STRL LF (DRAPE) ×2 IMPLANT
BLADE SURG 15 STRL LF DISP TIS (BLADE) IMPLANT
BLADE SURG 15 STRL SS (BLADE)
CATH FOLEY 3WAY 30CC 24FR (CATHETERS) ×2
CATH URTH STD 24FR FL 3W 2 (CATHETERS) ×1 IMPLANT
CLOTH BEACON ORANGE TIMEOUT ST (SAFETY) ×2 IMPLANT
DRAPE CAMERA CLOSED 9X96 (DRAPES) ×2 IMPLANT
ELECT BUTTON HF 24-28F 2 30DE (ELECTRODE) ×1 IMPLANT
ELECT HF RESECT BIPO 24F 45 ND (CUTTING LOOP) ×1 IMPLANT
ELECT LOOP MED HF 24F 12D (CUTTING LOOP) ×1 IMPLANT
ELECT LOOP MED HF 24F 12D CBL (CLIP) ×2 IMPLANT
ELECT RESECT VAPORIZE 12D CBL (ELECTRODE) ×2 IMPLANT
EVACUATOR MICROVAS BLADDER (UROLOGICAL SUPPLIES) ×2 IMPLANT
GLOVE BIOGEL M 8.0 STRL (GLOVE) ×2 IMPLANT
GOWN STRL REIN XL XLG (GOWN DISPOSABLE) ×2 IMPLANT
HOLDER FOLEY CATH W/STRAP (MISCELLANEOUS) ×1 IMPLANT
IV NS IRRIG 3000ML ARTHROMATIC (IV SOLUTION) ×4 IMPLANT
KIT ASPIRATION TUBING (SET/KITS/TRAYS/PACK) ×2 IMPLANT
MANIFOLD NEPTUNE II (INSTRUMENTS) ×2 IMPLANT
PACK CYSTO (CUSTOM PROCEDURE TRAY) ×2 IMPLANT
SUT ETHILON 3 0 PS 1 (SUTURE) IMPLANT
SYR 30ML LL (SYRINGE) ×1 IMPLANT
SYRINGE IRR TOOMEY STRL 70CC (SYRINGE) ×2 IMPLANT
TUBING CONNECTING 10 (TUBING) ×2 IMPLANT
WIRE COONS/BENSON .038X145CM (WIRE) ×1 IMPLANT

## 2012-07-11 NOTE — Anesthesia Preprocedure Evaluation (Addendum)
Anesthesia Evaluation  Patient identified by MRN, date of birth, ID band Patient awake    Reviewed: Allergy & Precautions, H&P , NPO status , Patient's Chart, lab work & pertinent test results, reviewed documented beta blocker date and time   Airway Mallampati: II TM Distance: >3 FB Neck ROM: Full    Dental  (+) Dental Advisory Given   Pulmonary neg pulmonary ROS,  breath sounds clear to auscultation        Cardiovascular hypertension, Pt. on medications and Pt. on home beta blockers Rhythm:Regular Rate:Normal     Neuro/Psych negative neurological ROS  negative psych ROS   GI/Hepatic Neg liver ROS, GERD-  ,  Endo/Other  negative endocrine ROS  Renal/GU Renal disease     Musculoskeletal negative musculoskeletal ROS (+)   Abdominal (+) + obese,   Peds  Hematology negative hematology ROS (+)   Anesthesia Other Findings   Reproductive/Obstetrics                          Anesthesia Physical  Anesthesia Plan  ASA: III  Anesthesia Plan: General   Post-op Pain Management:    Induction: Intravenous  Airway Management Planned: LMA  Additional Equipment:   Intra-op Plan:   Post-operative Plan: Extubation in OR  Informed Consent: I have reviewed the patients History and Physical, chart, labs and discussed the procedure including the risks, benefits and alternatives for the proposed anesthesia with the patient or authorized representative who has indicated his/her understanding and acceptance.   Dental advisory given  Plan Discussed with: CRNA  Anesthesia Plan Comments:         Anesthesia Quick Evaluation

## 2012-07-11 NOTE — Progress Notes (Signed)
Patient ID: Thomas David, male   DOB: Feb 09, 1935, 77 y.o.   MRN: [id number] He reports no discomfort or other complaint.  He is afebrile with stable val signs. His urine is slightly pink on CBI with no clots. His abdomen is soft and nontender with no suprapubic tenderness or mass noted.  He appears to status post TURP this morning. I have discussed the anticipated postoperative course with him and his wife tonight.  He will undergo a voiding in the morning with anticipation of discharge in the a.m.

## 2012-07-11 NOTE — Anesthesia Postprocedure Evaluation (Signed)
Anesthesia Post Note  Patient: Thomas David  Procedure(s) Performed: Procedure(s) (LRB): TRANSURETHRAL PROSTATECTOMY WITH GYRUS INSTRUMENTS (N/A)  Anesthesia type: General  Patient location: PACU  Post pain: Pain level controlled  Post assessment: Post-op Vital signs reviewed  Last Vitals: BP 150/73  Pulse 62  Temp(Src) 36.3 C (Oral)  Resp 18  Ht 5' 9"  (1.753 m)  Wt 229 lb (103.874 kg)  BMI 33.8 kg/m2  SpO2 100%  Post vital signs: Reviewed  Level of consciousness: sedated  Complications: No apparent anesthesia complications

## 2012-07-11 NOTE — Interval H&P Note (Signed)
History and Physical Interval Note:  07/11/2012 7:16 AM  [name]  has presented today for surgery, with the diagnosis of BPH WITH RETENTION   The various methods of treatment have been discussed with the patient and family. After consideration of risks, benefits and other options for treatment, the patient has consented to  Procedure(s): TRANSURETHRAL PROSTATECTOMY WITH GYRUS INSTRUMENTS (N/A) as a surgical intervention .  The patient's history has been reviewed, patient examined, no change in status, stable for surgery.  I have reviewed the patient's chart and labs.  Questions were answered to the patient's satisfaction.     Thomas David

## 2012-07-11 NOTE — Op Note (Signed)
PATIENT:  Thomas David  PRE-OPERATIVE DIAGNOSIS: 1. BPH with outlet obstruction 2. Urinary retention  POST-OPERATIVE DIAGNOSIS: Same  PROCEDURE:  Procedure(s): TRANSURETHRAL RESECTION OF the prostate  SURGEON:  Surgeon(s): Claybon Jabs  ANESTHESIA:   General  EBL:  less than 50 mL  DRAINS: Urinary Catheter (24 Fr. three-way Foley)   SPECIMEN:  Source of Specimen:  Prostate chips  DISPOSITION OF SPECIMEN:  PATHOLOGY  Indication: Thomas David is a 77 year old male with known BPH who developed urinary retention after thyroid surgery. He was placed on maximum medical management and attempts at voiding trials were unsuccessful. We therefore discussed the options and elected to proceed with transurethral resection of his prostate. A preoperative culture was performed and was found to be positive for coag negative Staphylococcus that was sensitive to nitrofurantoin. He was started on this preoperatively. It was also sensitive to gentamicin and he received preoperative gentamicin as well as Cipro.   Description of operation: The patient was taken to the operating room and administered general anesthesia. He was then placed on the table and moved to the dorsal lithotomy position after which his genitalia was sterilely prepped and draped. An official timeout was then performed.  The 26 French resectoscope with Timberlake obturator was then introduced into the bladder and the obturator was removed. The resectoscope element with 12 lens was then inserted and the bladder was fully and systematically inspected. Ureteral orifices were noted to be in the normal anatomic positions well away from the bladder neck.  There was some mild inflammation of the mucosa do to the presence of his Foley catheter but no worrisome lesions were identified.   I then began resecting the prostate first at the 6:00 position from the bladder neck back to the level of the veru. I then resected the 5 and 7:00 positions and  then proceeded to resect the left lobe of the prostate from the bladder neck back to the level of the ureter down to the surgical capsule. The right lobe was then resected an identical fashion. Anterior tissue was resected away and I then resected tissue from the apex with care being taken to remain proximal to the veru at all times. All the prostatic chips were flushed into the bladder and I used the Microvasive evacuator to remove all of these. Reinspection revealed no prostatic chips within the bladder and an intact bladder as well as ureteral orifice these bilaterally. I filled the bladder and then removed the resectoscope noting a good stream.  His Foley catheter was then inserted and irrigated. The irrigation returned clear. It was connected to closed system drainage as well and CBI and the patient was awakened and taken to recovery room in stable and satisfactory condition. Tolerated procedure well no intraoperative complications.   PLAN OF CARE:  He'll be observed overnight with anticipation of discharge in the morning after a voiding trial.   PATIENT DISPOSITION:  PACU - hemodynamically stable.

## 2012-07-11 NOTE — Transfer of Care (Signed)
Immediate Anesthesia Transfer of Care Note  Patient: Thomas David  Procedure(s) Performed: Procedure(s): TRANSURETHRAL PROSTATECTOMY WITH GYRUS INSTRUMENTS (N/A)  Patient Location: PACU  Anesthesia Type:General  Level of Consciousness: awake and alert   Airway & Oxygen Therapy: Patient Spontanous Breathing and Patient connected to face mask oxygen  Post-op Assessment: Report given to PACU RN and Post -op Vital signs reviewed and stable  Post vital signs: Reviewed and stable  Complications: No apparent anesthesia complications

## 2012-07-12 ENCOUNTER — Encounter (HOSPITAL_COMMUNITY): Payer: Self-pay | Admitting: Urology

## 2012-07-12 LAB — GLUCOSE, CAPILLARY
Glucose-Capillary: 125 mg/dL — ABNORMAL HIGH (ref 70–99)
Glucose-Capillary: 148 mg/dL — ABNORMAL HIGH (ref 70–99)

## 2012-07-12 MED ORDER — PHENAZOPYRIDINE HCL 200 MG PO TABS: 200.0000 mg | ORAL_TABLET | Freq: Three times a day (TID) | ORAL | Status: DC | PRN

## 2012-07-12 MED ORDER — HYDROCODONE-ACETAMINOPHEN 7.5-325 MG PO TABS: 1.0000 | ORAL_TABLET | ORAL | Status: DC | PRN

## 2012-07-12 NOTE — Progress Notes (Signed)
Pt able to void spontaneously x3. Will discharge home after lunch.

## 2012-07-12 NOTE — Progress Notes (Signed)
Discharge instructions given to pt, verbalized understanding. Left the unit in stable condition. 

## 2012-07-12 NOTE — Care Management Note (Signed)
    Page 1 of 1   07/12/2012     1:31:03 PM   CARE MANAGEMENT NOTE 07/12/2012  Patient:  Thomas David, Thomas David   Account Number:  [id number]  Date Initiated:  07/12/2012  Documentation initiated by:  Dessa Phi  Subjective/Objective Assessment:   ADMITTED W/BPH.     Action/Plan:   FROM HOME.   Anticipated DC Date:  07/12/2012   Anticipated DC Plan:  [place]  CM consult      Choice offered to / List presented to:             Status of service:  Completed, signed off Medicare Important Message given?   (If response is "NO", the following Medicare IM given date fields will be blank) Date Medicare IM given:   Date Additional Medicare IM given:    Discharge Disposition:  HOME/SELF CARE  Per UR Regulation:  Reviewed for med. necessity/level of care/duration of stay  If discussed at [place] of Stay Meetings, dates discussed:    Comments:  07/12/12 Kairee Kozma RN,BSN NCM [phone] D/C HOME NO [place].

## 2012-07-12 NOTE — Progress Notes (Signed)
Foley discontinued per order. Patient given urinal and told to save urine. Patient verbalized understanding.

## 2012-07-12 NOTE — Discharge Summary (Signed)
Physician Discharge Summary  Patient ID: Thomas David MRN: [id number] DOB/AGE: [birth year] 77 y.o.  Admit date: 07/11/2012 Discharge date: 07/12/2012  Admission Diagnoses:BPH with outlet obstruction and urinary retention  Discharge Diagnoses:  Principal Problem:   Benign localized hyperplasia of prostate with urinary retention   Discharged Condition: good  Hospital Course: The patient developed urinary retention after a recent surgery. He was placed on maximum medical management and underwent several voiding trials unsuccessfully.We therefore discussed treatment with transurethral resection of the prostate. A preoperative urine culture was obtained and he was placed on appropriate antibiotics preoperatively and taken to the operating room yesterday where he underwent transurethral resection of his prostate without complication. The night of his surgery he appear to be doing well with no significant discomfort and only slightly pink urine. The following morning he continued to remain in no pain. His catheter was removed and he has not voided yet but will be observed and discharged once he voids spontaneously.  Consults: None  Significant Diagnostic Studies: None  Treatments: TURP  Discharge Exam: Blood pressure 151/73, pulse 69, temperature 97.9 F (36.6 C), temperature source Oral, resp. rate 16, height 5' 9"  (1.753 m), weight 103.874 kg (229 lb), SpO2 100.00%. He is awake, alert and in no distress. His abdomen is soft and nontender without mass.  Disposition: 01-Home or Self Care He has 3-4 days left of the antibiotic that was prescribed preoperatively. He will continue to take that and be given Pyridium and Vicodin.  Discharge Orders   Future Appointments Provider Department Dept Phone   08/24/2012 8:10 AM Kathyrn Drown, MD [facility] [phone]   08/30/2012 11:30 AM Rogene Houston, MD [facility] [phone]   Future Orders Complete By  Expires     Discharge patient  As directed         Medication List    TAKE these medications       allopurinol 300 MG tablet  Commonly known as:  ZYLOPRIM  Take 300 mg by mouth every morning.     amLODipine 5 MG tablet  Commonly known as:  NORVASC  Take 5 mg by mouth every morning.     aspirin EC 81 MG tablet  Take 81 mg by mouth at bedtime.     doxycycline 100 MG capsule  Commonly known as:  VIBRAMYCIN  Take 1 capsule (100 mg total) by mouth 2 (two) times daily.     fluticasone 50 MCG/ACT nasal spray  Commonly known as:  FLONASE  Place 2 sprays into the nose at bedtime.     HYDROcodone-acetaminophen 7.5-325 MG per tablet  Commonly known as:  NORCO  Take 1-2 tablets by mouth every 4 (four) hours as needed for pain.     ketoconazole 2 % cream  Commonly known as:  NIZORAL  Apply topically 2 (two) times daily.     lisinopril 2.5 MG tablet  Commonly known as:  PRINIVIL,ZESTRIL  Take 2.5 mg by mouth every morning.     metoprolol 50 MG tablet  Commonly known as:  LOPRESSOR  Take 100 mg by mouth at bedtime.     omeprazole 20 MG capsule  Commonly known as:  PRILOSEC  Take 20 mg by mouth every morning.     OVER THE COUNTER MEDICATION  Take 30 mLs by mouth every evening. Xango Juice.     PENTASA 500 MG CR capsule  Generic drug:  mesalamine     phenazopyridine 200 MG tablet  Commonly known  as:  PYRIDIUM  Take 1 tablet (200 mg total) by mouth 3 (three) times daily as needed for pain.     [facility] COLON HEALTH PO  Take by mouth daily.     simvastatin 20 MG tablet  Commonly known as:  ZOCOR  Take 20 mg by mouth at bedtime.     tamsulosin 0.4 MG Caps  Commonly known as:  FLOMAX  Take 0.4 mg by mouth 2 (two) times daily.     tetrahydrozoline-zinc 0.05-0.25 % ophthalmic solution  Commonly known as:  VISINE-AC  Place 2 drops into both eyes 4 (four) times daily as needed (dry eyes).     vitamin B-12 1000 MCG tablet  Commonly known as:  CYANOCOBALAMIN  Take  1,000 mcg by mouth 2 (two) times daily.           Follow-up Information   Follow up with Claybon Jabs, MD On 07/18/2012. (at 10:15)    Contact information:   [address] [place] [address] [phone]       Signed: Claybon Jabs 07/12/2012, 6:49 AM

## 2012-07-18 DIAGNOSIS — N401 Enlarged prostate with lower urinary tract symptoms: Secondary | ICD-10-CM | POA: Diagnosis not present

## 2012-07-18 DIAGNOSIS — R82998 Other abnormal findings in urine: Secondary | ICD-10-CM | POA: Diagnosis not present

## 2012-07-20 ENCOUNTER — Telehealth (INDEPENDENT_AMBULATORY_CARE_PROVIDER_SITE_OTHER): Payer: Self-pay | Admitting: *Deleted

## 2012-07-20 DIAGNOSIS — K529 Noninfective gastroenteritis and colitis, unspecified: Secondary | ICD-10-CM

## 2012-07-20 MED ORDER — MESALAMINE ER 500 MG PO CPCR: 1000.0000 mg | ORAL_CAPSULE | Freq: Two times a day (BID) | ORAL | Status: DC

## 2012-07-20 NOTE — Telephone Encounter (Signed)
Thomas David is on Pentasa 4 daily. He has been taking temporarily 8 daily and "Dr. Laural Golden is aware of this, per Laveda Abbe". His next mail order delivery is not due for 2 more weeks. He is needing a prescription called to [facility] in [place] for 2 week worth. His return phone number is [phone].

## 2012-07-23 ENCOUNTER — Other Ambulatory Visit: Payer: Self-pay | Admitting: Family Medicine

## 2012-07-28 DIAGNOSIS — H5200 Hypermetropia, unspecified eye: Secondary | ICD-10-CM | POA: Diagnosis not present

## 2012-07-28 DIAGNOSIS — H524 Presbyopia: Secondary | ICD-10-CM | POA: Diagnosis not present

## 2012-07-28 DIAGNOSIS — H52229 Regular astigmatism, unspecified eye: Secondary | ICD-10-CM | POA: Diagnosis not present

## 2012-07-28 DIAGNOSIS — H2510 Age-related nuclear cataract, unspecified eye: Secondary | ICD-10-CM | POA: Diagnosis not present

## 2012-07-29 ENCOUNTER — Telehealth: Payer: Self-pay | Admitting: Family Medicine

## 2012-07-29 MED ORDER — OMEPRAZOLE 20 MG PO CPDR: 20.0000 mg | DELAYED_RELEASE_CAPSULE | Freq: Every day | ORAL | Status: DC

## 2012-07-29 NOTE — Telephone Encounter (Signed)
Med resent electronically to [facility]. Patient notified.

## 2012-07-29 NOTE — Telephone Encounter (Signed)
Patient says [facility] has not received his omeprazole yet and he would like Rx called into them.

## 2012-08-24 ENCOUNTER — Ambulatory Visit (INDEPENDENT_AMBULATORY_CARE_PROVIDER_SITE_OTHER): Payer: Medicare Other | Admitting: Family Medicine

## 2012-08-24 ENCOUNTER — Encounter: Payer: Self-pay | Admitting: Family Medicine

## 2012-08-24 VITALS — BP 134/76 | Wt 228.6 lb

## 2012-08-24 DIAGNOSIS — I10 Essential (primary) hypertension: Secondary | ICD-10-CM | POA: Diagnosis not present

## 2012-08-24 DIAGNOSIS — R7309 Other abnormal glucose: Secondary | ICD-10-CM | POA: Diagnosis not present

## 2012-08-24 DIAGNOSIS — R7303 Prediabetes: Secondary | ICD-10-CM

## 2012-08-24 DIAGNOSIS — D510 Vitamin B12 deficiency anemia due to intrinsic factor deficiency: Secondary | ICD-10-CM

## 2012-08-24 DIAGNOSIS — Z79899 Other long term (current) drug therapy: Secondary | ICD-10-CM | POA: Diagnosis not present

## 2012-08-24 DIAGNOSIS — E785 Hyperlipidemia, unspecified: Secondary | ICD-10-CM | POA: Diagnosis not present

## 2012-08-24 DIAGNOSIS — M109 Gout, unspecified: Secondary | ICD-10-CM

## 2012-08-24 DIAGNOSIS — R5381 Other malaise: Secondary | ICD-10-CM

## 2012-08-24 DIAGNOSIS — R5383 Other fatigue: Secondary | ICD-10-CM

## 2012-08-24 DIAGNOSIS — E119 Type 2 diabetes mellitus without complications: Secondary | ICD-10-CM | POA: Insufficient documentation

## 2012-08-24 LAB — HEMOGLOBIN A1C
Hgb A1c MFr Bld: 5.9 % — ABNORMAL HIGH (ref <5.7)
Mean Plasma Glucose: 123 mg/dL — ABNORMAL HIGH (ref <117)

## 2012-08-24 LAB — HEPATIC FUNCTION PANEL
ALT: 16 U/L (ref 0–53)
Albumin: 4.2 g/dL (ref 3.5–5.2)
Indirect Bilirubin: 0.3 mg/dL (ref 0.0–0.9)
Total Protein: 7.2 g/dL (ref 6.0–8.3)

## 2012-08-24 LAB — LIPID PANEL
Cholesterol: 132 mg/dL (ref 0–200)
HDL: 39 mg/dL — ABNORMAL LOW (ref >39)
LDL Cholesterol: 55 mg/dL (ref 0–99)
Triglycerides: 189 mg/dL — ABNORMAL HIGH (ref <150)
VLDL: 38 mg/dL (ref 0–40)

## 2012-08-24 LAB — BASIC METABOLIC PANEL
BUN: 14 mg/dL (ref 6–23)
Chloride: 104 mEq/L (ref 96–112)
Creat: 0.97 mg/dL (ref 0.50–1.35)
Potassium: 4.9 mEq/L (ref 3.5–5.3)

## 2012-08-24 NOTE — Progress Notes (Signed)
  Subjective:    Patient ID: Thomas David, male    DOB: 09/13/1935, 77 y.o.   MRN: [id number]  Hypertension This is a chronic problem. The current episode started more than 1 year ago. Pertinent negatives include no chest pain or palpitations.  Hyperlipidemia This is a chronic problem. The current episode started more than 1 year ago. Pertinent negatives include no chest pain.   The patient has not had any flareups of his gout he takes his medicine he tries to limit his protein. He does relate at times he has a slight cough comes and goes it was gone for 6 months then it came back. Nonproductive. No wheezing with it her fevers. He does try to eat a healthy diet in tries to minimize fats in the diet he is also try to minimize sugars. He has not lost any weight doing a little bit of exercise but not much. He denies rectal bleeding vomiting diarrhea chest pressure tightness pain. Family history past medical issues social history reviewed.  Review of Systems  Constitutional: Negative for activity change, appetite change and fatigue.  HENT: Negative for congestion and rhinorrhea.   Respiratory: Positive for cough. Negative for chest tightness.   Cardiovascular: Negative for chest pain and palpitations.  Endocrine: Negative for polydipsia.  Genitourinary: Positive for frequency. Negative for dysuria and difficulty urinating.       Objective:   Physical Exam Lungs are clear no crackles Heart is regular no murmurs Abdomen is soft no tenderness. Extremities no edema skin warm dry Neck no masses       Assessment & Plan:  He will be doing sleep study soon Patient is [place]. Hypertension under good control continue current measures Hyperlipidemia good control check lab work Patient followup in 6 months time Hyperglycemia to minimize starches. Check hemoglobin A1c Pernicious anemia will check CBC. If he develops a dry cough that's persistent  consider switching away from lisinopril to losartan

## 2012-08-25 LAB — TSH: TSH: 2.935 u[IU]/mL (ref 0.350–4.500)

## 2012-08-28 ENCOUNTER — Encounter: Payer: Self-pay | Admitting: Family Medicine

## 2012-08-30 ENCOUNTER — Encounter (INDEPENDENT_AMBULATORY_CARE_PROVIDER_SITE_OTHER): Payer: Self-pay | Admitting: Internal Medicine

## 2012-08-30 ENCOUNTER — Ambulatory Visit (INDEPENDENT_AMBULATORY_CARE_PROVIDER_SITE_OTHER): Payer: Medicare Other | Admitting: Internal Medicine

## 2012-08-30 VITALS — BP 128/70 | HR 76 | Temp 97.9°F | Resp 18 | Ht 69.0 in | Wt 228.3 lb

## 2012-08-30 DIAGNOSIS — K5080 Crohn's disease of both small and large intestine without complications: Secondary | ICD-10-CM

## 2012-08-30 MED ORDER — MESALAMINE ER 0.375 G PO CP24: 1500.0000 mg | ORAL_CAPSULE | Freq: Every day | ORAL | Status: DC

## 2012-08-30 NOTE — Patient Instructions (Addendum)
Heme occult x1 Call with progress report in 4 weeks or earlier if you have any side effects with Apriso.

## 2012-08-30 NOTE — Progress Notes (Signed)
Presenting complaint;  Follow for ileocolonic Crohn's disease.  Subjective:  Patient is 77 year old Caucasian male with history of Crohn disease and is here for yearly visit. In April of this year he had right hemithyroidectomy for multinodular goiter and developed constipation. This was followed by diarrhea and he slowly got better. He then underwent TURP on 07/11/2012 and on issues. As of the first of this month his bowels have been moving better. He is passing soft stools usually 3-4 per day. He has urinary urgency. He denies melena or rectal bleeding. His heartburn is well controlled with therapy. His appetite is good and his weight has been stable. He is interested in switching to another oral mesalamine as Pentasa is getting to be too expensive for him. His heartburn is well controlled with therapy.  Current Medications: Current Outpatient Prescriptions  Medication Sig Dispense Refill  . allopurinol (ZYLOPRIM) 300 MG tablet Take 300 mg by mouth every morning.       Marland Kitchen amLODipine (NORVASC) 5 MG tablet Take 5 mg by mouth every morning.       Marland Kitchen aspirin EC 81 MG tablet Take 81 mg by mouth at bedtime.      . fluticasone (FLONASE) 50 MCG/ACT nasal spray Place 2 sprays into the nose at bedtime.       Marland Kitchen lisinopril (PRINIVIL,ZESTRIL) 2.5 MG tablet Take 2.5 mg by mouth every morning.       . mesalamine (PENTASA) 500 MG CR capsule Take 2 capsules (1,000 mg total) by mouth 2 (two) times daily before a meal.  56 capsule  0  . metoprolol (LOPRESSOR) 50 MG tablet Take 100 mg by mouth at bedtime.       Marland Kitchen omeprazole (PRILOSEC) 20 MG capsule Take 1 capsule (20 mg total) by mouth daily.  90 capsule  1  . OVER THE COUNTER MEDICATION Take 30 mLs by mouth every evening. Xango Juice.      . Probiotic Product ([place]) Take by mouth daily.      . simvastatin (ZOCOR) 20 MG tablet Take 20 mg by mouth at bedtime.       . tamsulosin (FLOMAX) 0.4 MG CAPS Take 0.4 mg by mouth 2 (two) times daily.       . vitamin B-12 (CYANOCOBALAMIN) 1000 MCG tablet Take 1,000 mcg by mouth 2 (two) times daily.       No current facility-administered medications for this visit.     Objective: Blood pressure 128/70, pulse 76, temperature 97.9 F (36.6 C), temperature source Oral, resp. rate 18, height 5' 9"  (1.753 m), weight 228 lb 4.8 oz (103.556 kg). Patient is alert and in no acute distress. Conjunctiva is pink. Sclera is nonicteric Oropharyngeal mucosa is normal. No neck masses or thyromegaly noted. Cardiac exam with regular rhythm normal S1 and S2. No murmur or gallop noted. Lungs are clear to auscultation. Abdomen is protuberant but soft and nontender without organomegaly or masses. No LE edema or clubbing noted.  Labs/studies Results: CBC from 07/05/2012 WBC 7.6 H&H 12.9 and 38.9 and platelet count 201K.  Assessment:  #1. Ileocolonic Crohn's disease. He had some bowel issues following thyroid surgery symptoms not typical of relapse. Reason H&H was just below normal and this may be result of some blood loss secondary to TURP. Will switch him to another oral mesalamine. He is due for surveillance colonoscopy which will be delayed until next year. #2. GERD. Tandem is well controlled with therapy. .   Plan:  Discontinue Pentasa when prescription  runs out. Begin Apriso 1500 mg po qd. Will delay surveillance colonoscopy until next year. OV in one year.

## 2012-09-01 ENCOUNTER — Telehealth (INDEPENDENT_AMBULATORY_CARE_PROVIDER_SITE_OTHER): Payer: Self-pay | Admitting: *Deleted

## 2012-09-01 ENCOUNTER — Ambulatory Visit (INDEPENDENT_AMBULATORY_CARE_PROVIDER_SITE_OTHER): Payer: Medicare Other | Admitting: Otolaryngology

## 2012-09-01 DIAGNOSIS — D449 Neoplasm of uncertain behavior of unspecified endocrine gland: Secondary | ICD-10-CM | POA: Diagnosis not present

## 2012-09-01 NOTE — Telephone Encounter (Signed)
Thomas David has a question about the medicine Dr Laural Golden wanted him to change, he has about 2 months left but wasn't sure if he needed to go ahead and change or wait, please call @ [phone]

## 2012-09-01 NOTE — Telephone Encounter (Signed)
Thomas David was called and advised that he should finish the Pentasa then start the new medication. I also advised him that his hemoccult was negative.

## 2012-09-22 ENCOUNTER — Encounter (INDEPENDENT_AMBULATORY_CARE_PROVIDER_SITE_OTHER): Payer: Self-pay

## 2012-10-18 DIAGNOSIS — N401 Enlarged prostate with lower urinary tract symptoms: Secondary | ICD-10-CM | POA: Diagnosis not present

## 2012-10-18 DIAGNOSIS — R351 Nocturia: Secondary | ICD-10-CM | POA: Diagnosis not present

## 2012-10-18 DIAGNOSIS — R350 Frequency of micturition: Secondary | ICD-10-CM | POA: Diagnosis not present

## 2012-10-18 DIAGNOSIS — N318 Other neuromuscular dysfunction of bladder: Secondary | ICD-10-CM | POA: Diagnosis not present

## 2012-11-17 ENCOUNTER — Other Ambulatory Visit (INDEPENDENT_AMBULATORY_CARE_PROVIDER_SITE_OTHER): Payer: Self-pay | Admitting: Internal Medicine

## 2012-11-17 DIAGNOSIS — K5010 Crohn's disease of large intestine without complications: Secondary | ICD-10-CM

## 2012-11-17 MED ORDER — MESALAMINE ER 0.375 G PO CP24: 1500.0000 mg | ORAL_CAPSULE | Freq: Every day | ORAL | Status: DC

## 2012-11-23 ENCOUNTER — Telehealth (INDEPENDENT_AMBULATORY_CARE_PROVIDER_SITE_OTHER): Payer: Self-pay | Admitting: *Deleted

## 2012-11-23 ENCOUNTER — Other Ambulatory Visit (INDEPENDENT_AMBULATORY_CARE_PROVIDER_SITE_OTHER): Payer: Self-pay | Admitting: Internal Medicine

## 2012-11-23 DIAGNOSIS — K5010 Crohn's disease of large intestine without complications: Secondary | ICD-10-CM

## 2012-11-23 MED ORDER — MESALAMINE ER 0.375 G PO CP24: 1500.0000 mg | ORAL_CAPSULE | Freq: Every day | ORAL | Status: DC

## 2012-11-23 NOTE — Telephone Encounter (Signed)
Forwarded to Dr.Rehman to address, note that the patient will need a 2 week supply from [facility] in [place].

## 2012-11-23 NOTE — Telephone Encounter (Signed)
Thomas David is out of his Dorthy Cooler and the mail order company is just sending it out. Would like to get a 2 week supply call in to [facility]. [name]'s return phone number if any questions is [phone].

## 2012-12-08 ENCOUNTER — Ambulatory Visit (INDEPENDENT_AMBULATORY_CARE_PROVIDER_SITE_OTHER): Payer: Medicare Other | Admitting: *Deleted

## 2012-12-08 DIAGNOSIS — Z23 Encounter for immunization: Secondary | ICD-10-CM

## 2013-01-11 NOTE — Telephone Encounter (Signed)
This medication was addressed,called in for the patient.

## 2013-01-31 ENCOUNTER — Telehealth (INDEPENDENT_AMBULATORY_CARE_PROVIDER_SITE_OTHER): Payer: Self-pay | Admitting: Internal Medicine

## 2013-01-31 DIAGNOSIS — K5010 Crohn's disease of large intestine without complications: Secondary | ICD-10-CM

## 2013-01-31 MED ORDER — MESALAMINE ER 0.375 G PO CP24: 1500.0000 mg | ORAL_CAPSULE | Freq: Every day | ORAL | Status: DC

## 2013-01-31 NOTE — Telephone Encounter (Signed)
Rx sent to [facility]

## 2013-02-13 ENCOUNTER — Ambulatory Visit (INDEPENDENT_AMBULATORY_CARE_PROVIDER_SITE_OTHER): Payer: Medicare Other | Admitting: Family Medicine

## 2013-02-13 ENCOUNTER — Encounter: Payer: Self-pay | Admitting: Family Medicine

## 2013-02-13 VITALS — BP 140/82 | Ht 68.5 in | Wt 225.6 lb

## 2013-02-13 DIAGNOSIS — E041 Nontoxic single thyroid nodule: Secondary | ICD-10-CM

## 2013-02-13 DIAGNOSIS — Z0000 Encounter for general adult medical examination without abnormal findings: Secondary | ICD-10-CM

## 2013-02-13 DIAGNOSIS — I10 Essential (primary) hypertension: Secondary | ICD-10-CM

## 2013-02-13 DIAGNOSIS — R7303 Prediabetes: Secondary | ICD-10-CM

## 2013-02-13 DIAGNOSIS — R7309 Other abnormal glucose: Secondary | ICD-10-CM | POA: Diagnosis not present

## 2013-02-13 LAB — TSH: TSH: 4.28 u[IU]/mL (ref 0.350–4.500)

## 2013-02-13 LAB — BASIC METABOLIC PANEL
BUN: 16 mg/dL (ref 6–23)
CALCIUM: 9.7 mg/dL (ref 8.4–10.5)
CHLORIDE: 100 meq/L (ref 96–112)
CO2: 32 mEq/L (ref 19–32)
CREATININE: 0.88 mg/dL (ref 0.50–1.35)
Glucose, Bld: 101 mg/dL — ABNORMAL HIGH (ref 70–99)
Potassium: 4.6 mEq/L (ref 3.5–5.3)
Sodium: 139 mEq/L (ref 135–145)

## 2013-02-13 LAB — HEMOGLOBIN A1C
Hgb A1c MFr Bld: 6.6 % — ABNORMAL HIGH (ref <5.7)
MEAN PLASMA GLUCOSE: 143 mg/dL — AB (ref <117)

## 2013-02-13 NOTE — Progress Notes (Signed)
   Subjective:    Patient ID: Thomas David, male    DOB: 1935/06/10, 78 y.o.   MRN: [id number]  HPI AWV- Annual Wellness Visit  The patient was seen for their annual wellness visit. The patient's past medical history, surgical history, and family history were reviewed. Pertinent vaccines were reviewed ( tetanus, pneumonia, shingles, flu) The patient's medication list was reviewed and updated. The height and weight were entered. The patient's current BMI is: 34.2 Waist: 42in  Cognitive screening was completed. PASS Outcome of Mini - P[facility]:EKBT  Falls within the past 6 months: NO Current tobacco usage: NO (All patients who use tobacco were given written and verbal information on quitting)  Recent listing of emergency department/hospitalizations over the past year were reviewed.  current specialist the patient sees on a regular basis: Thyroid-patient had removal do to growth  Medicare annual wellness visit patient questionnaire was reviewed.  A written screening schedule for the patient for the next 5-10 years was given. Appropriate discussion of followup regarding next visit was discussed.       Review of Systems  Constitutional: Negative for fever, activity change and appetite change.  HENT: Negative for congestion and rhinorrhea.   Eyes: Negative for discharge.  Respiratory: Negative for cough and wheezing.   Cardiovascular: Negative for chest pain.  Gastrointestinal: Negative for vomiting, abdominal pain and blood in stool.  Genitourinary: Negative for frequency and difficulty urinating.  Musculoskeletal: Negative for neck pain.  Skin: Negative for rash.  Allergic/Immunologic: Negative for environmental allergies and food allergies.  Neurological: Negative for weakness and headaches.  Psychiatric/Behavioral: Negative for agitation.       Objective:   Physical Exam  Constitutional: He appears well-developed and well-nourished.  HENT:  Head: Normocephalic and  atraumatic.  Right Ear: External ear normal.  Left Ear: External ear normal.  Nose: Nose normal.  Mouth/Throat: Oropharynx is clear and moist.  Eyes: EOM are normal. Pupils are equal, round, and reactive to light.  Neck: Normal range of motion. Neck supple. No thyromegaly present.  Cardiovascular: Normal rate, regular rhythm and normal heart sounds.   No murmur heard. Pulmonary/Chest: Effort normal and breath sounds normal. No respiratory distress. He has no wheezes.  Abdominal: Soft. Bowel sounds are normal. He exhibits no distension and no mass. There is no tenderness.  Genitourinary: Penis normal.  He has a tight rectum. Prostate was normal what I felt.  Musculoskeletal: Normal range of motion. He exhibits no edema.  Lymphadenopathy:    He has no cervical adenopathy.  Neurological: He is alert. He exhibits normal muscle tone.  Skin: Skin is warm and dry. No erythema.  Psychiatric: He has a normal mood and affect. His behavior is normal. Judgment normal.          Assessment & Plan:  This patient will be due for another colonoscopy in a couple years time. Patient was made aware of this.  We discussed PSA he prefers not to do any further testing on this  I will discuss with radiology if they feel any followup scans are indicated written guarding the adrenal gland being enlarged and lymph nodes noted in the paratracheal region

## 2013-02-14 ENCOUNTER — Encounter: Payer: Self-pay | Admitting: Family Medicine

## 2013-02-14 LAB — MICROALBUMIN, URINE: Microalb, Ur: 39.64 mg/dL — ABNORMAL HIGH (ref 0.00–1.89)

## 2013-06-14 ENCOUNTER — Telehealth (INDEPENDENT_AMBULATORY_CARE_PROVIDER_SITE_OTHER): Payer: Self-pay | Admitting: *Deleted

## 2013-06-14 NOTE — Telephone Encounter (Signed)
Would like to get samples of Apriso.  The return phone number is [phone].

## 2013-06-15 NOTE — Telephone Encounter (Signed)
At this time we have no Apriso Samples. Patient called and made aware.

## 2013-07-27 ENCOUNTER — Other Ambulatory Visit: Payer: Self-pay | Admitting: *Deleted

## 2013-07-27 DIAGNOSIS — Z79899 Other long term (current) drug therapy: Secondary | ICD-10-CM

## 2013-07-27 DIAGNOSIS — E7800 Pure hypercholesterolemia, unspecified: Secondary | ICD-10-CM

## 2013-07-27 DIAGNOSIS — R7303 Prediabetes: Secondary | ICD-10-CM

## 2013-07-27 DIAGNOSIS — I10 Essential (primary) hypertension: Secondary | ICD-10-CM

## 2013-07-27 DIAGNOSIS — D649 Anemia, unspecified: Secondary | ICD-10-CM

## 2013-08-08 ENCOUNTER — Encounter (INDEPENDENT_AMBULATORY_CARE_PROVIDER_SITE_OTHER): Payer: Self-pay | Admitting: *Deleted

## 2013-08-15 DIAGNOSIS — D649 Anemia, unspecified: Secondary | ICD-10-CM | POA: Diagnosis not present

## 2013-08-15 DIAGNOSIS — E7800 Pure hypercholesterolemia, unspecified: Secondary | ICD-10-CM | POA: Diagnosis not present

## 2013-08-15 DIAGNOSIS — Z79899 Other long term (current) drug therapy: Secondary | ICD-10-CM | POA: Diagnosis not present

## 2013-08-15 DIAGNOSIS — R7309 Other abnormal glucose: Secondary | ICD-10-CM | POA: Diagnosis not present

## 2013-08-15 DIAGNOSIS — I10 Essential (primary) hypertension: Secondary | ICD-10-CM | POA: Diagnosis not present

## 2013-08-15 LAB — CBC
HCT: 40.9 % (ref 39.0–52.0)
Hemoglobin: 14 g/dL (ref 13.0–17.0)
MCH: 29.1 pg (ref 26.0–34.0)
MCHC: 34.2 g/dL (ref 30.0–36.0)
MCV: 85 fL (ref 78.0–100.0)
PLATELETS: 208 10*3/uL (ref 150–400)
RBC: 4.81 MIL/uL (ref 4.22–5.81)
RDW: 14.6 % (ref 11.5–15.5)
WBC: 7.1 10*3/uL (ref 4.0–10.5)

## 2013-08-15 LAB — HEMOGLOBIN A1C
Hgb A1c MFr Bld: 6.3 % — ABNORMAL HIGH (ref <5.7)
MEAN PLASMA GLUCOSE: 134 mg/dL — AB (ref <117)

## 2013-08-16 LAB — LIPID PANEL
CHOLESTEROL: 123 mg/dL (ref 0–200)
HDL: 38 mg/dL — AB (ref >39)
LDL CALC: 39 mg/dL (ref 0–99)
TRIGLYCERIDES: 230 mg/dL — AB (ref <150)
Total CHOL/HDL Ratio: 3.2 Ratio
VLDL: 46 mg/dL — ABNORMAL HIGH (ref 0–40)

## 2013-08-16 LAB — HEPATIC FUNCTION PANEL
ALBUMIN: 4.1 g/dL (ref 3.5–5.2)
ALK PHOS: 72 U/L (ref 39–117)
ALT: 19 U/L (ref 0–53)
AST: 19 U/L (ref 0–37)
Bilirubin, Direct: 0.1 mg/dL (ref 0.0–0.3)
Indirect Bilirubin: 0.5 mg/dL (ref 0.2–1.2)
TOTAL PROTEIN: 7 g/dL (ref 6.0–8.3)
Total Bilirubin: 0.6 mg/dL (ref 0.2–1.2)

## 2013-08-16 LAB — BASIC METABOLIC PANEL
BUN: 13 mg/dL (ref 6–23)
CALCIUM: 9.4 mg/dL (ref 8.4–10.5)
CO2: 26 mEq/L (ref 19–32)
CREATININE: 0.81 mg/dL (ref 0.50–1.35)
Chloride: 103 mEq/L (ref 96–112)
Glucose, Bld: 105 mg/dL — ABNORMAL HIGH (ref 70–99)
Potassium: 4.5 mEq/L (ref 3.5–5.3)
Sodium: 138 mEq/L (ref 135–145)

## 2013-08-16 LAB — MICROALBUMIN, URINE: Microalb, Ur: 26.6 mg/dL — ABNORMAL HIGH (ref 0.00–1.89)

## 2013-08-21 ENCOUNTER — Ambulatory Visit (INDEPENDENT_AMBULATORY_CARE_PROVIDER_SITE_OTHER): Payer: Medicare Other | Admitting: Family Medicine

## 2013-08-21 ENCOUNTER — Encounter: Payer: Self-pay | Admitting: Family Medicine

## 2013-08-21 VITALS — BP 132/84 | Ht 68.5 in | Wt 228.6 lb

## 2013-08-21 DIAGNOSIS — R7309 Other abnormal glucose: Secondary | ICD-10-CM | POA: Diagnosis not present

## 2013-08-21 DIAGNOSIS — G4733 Obstructive sleep apnea (adult) (pediatric): Secondary | ICD-10-CM | POA: Diagnosis not present

## 2013-08-21 DIAGNOSIS — E785 Hyperlipidemia, unspecified: Secondary | ICD-10-CM | POA: Diagnosis not present

## 2013-08-21 DIAGNOSIS — I10 Essential (primary) hypertension: Secondary | ICD-10-CM

## 2013-08-21 DIAGNOSIS — R7303 Prediabetes: Secondary | ICD-10-CM

## 2013-08-21 DIAGNOSIS — N3281 Overactive bladder: Secondary | ICD-10-CM

## 2013-08-21 DIAGNOSIS — N318 Other neuromuscular dysfunction of bladder: Secondary | ICD-10-CM

## 2013-08-21 DIAGNOSIS — Z23 Encounter for immunization: Secondary | ICD-10-CM

## 2013-08-21 MED ORDER — METOPROLOL TARTRATE 50 MG PO TABS: 100.0000 mg | ORAL_TABLET | Freq: Every day | ORAL | Status: DC

## 2013-08-21 MED ORDER — LOSARTAN POTASSIUM 50 MG PO TABS: 50.0000 mg | ORAL_TABLET | Freq: Every day | ORAL | Status: DC

## 2013-08-21 NOTE — Patient Instructions (Addendum)
Hypertension Hypertension, commonly called high blood pressure, is when the force of blood pumping through your arteries is too strong. Your arteries are the blood vessels that carry blood from your heart throughout your body. A blood pressure reading consists of a higher number over a lower number, such as 110/72. The higher number (systolic) is the pressure inside your arteries when your heart pumps. The lower number (diastolic) is the pressure inside your arteries when your heart relaxes. Ideally you want your blood pressure below 120/80. Hypertension forces your heart to work harder to pump blood. Your arteries may become narrow or stiff. Having hypertension puts you at risk for heart disease, stroke, and other problems.  RISK FACTORS Some risk factors for high blood pressure are controllable. Others are not.  Risk factors you cannot control include:   Race. You may be at higher risk if you are African American.  Age. Risk increases with age.  Gender. Men are at higher risk than women before age 4 years. After age 42, women are at higher risk than men. Risk factors you can control include:  Not getting enough exercise or physical activity.  Being overweight.  Getting too much fat, sugar, calories, or salt in your diet.  Drinking too much alcohol. SIGNS AND SYMPTOMS Hypertension does not usually cause signs or symptoms. Extremely high blood pressure (hypertensive crisis) may cause headache, anxiety, shortness of breath, and nosebleed. DIAGNOSIS  To check if you have hypertension, your health care provider will measure your blood pressure while you are seated, with your arm held at the level of your heart. It should be measured at least twice using the same arm. Certain conditions can cause a difference in blood pressure between your right and left arms. A blood pressure reading that is higher than normal on one occasion does not mean that you need treatment. If one blood pressure reading  is high, ask your health care provider about having it checked again. TREATMENT  Treating high blood pressure includes making lifestyle changes and possibly taking medication. Living a healthy lifestyle can help lower high blood pressure. You may need to change some of your habits. Lifestyle changes may include:  Following the DASH diet. This diet is high in fruits, vegetables, and whole grains. It is low in salt, red meat, and added sugars.  Getting at least 2 1/2 hours of brisk physical activity every week.  Losing weight if necessary.  Not smoking.  Limiting alcoholic beverages.  Learning ways to reduce stress. If lifestyle changes are not enough to get your blood pressure under control, your health care provider may prescribe medicine. You may need to take more than one. Work closely with your health care provider to understand the risks and benefits. HOME CARE INSTRUCTIONS  Have your blood pressure rechecked as directed by your health care provider.   Only take medicine as directed by your health care provider. Follow the directions carefully. Blood pressure medicines must be taken as prescribed. The medicine does not work as well when you skip doses. Skipping doses also puts you at risk for problems.   Do not smoke.   Monitor your blood pressure at home as directed by your health care provider. SEEK MEDICAL CARE IF:   You think you are having a reaction to medicines taken.  You have recurrent headaches or feel dizzy.  You have swelling in your ankles.  You have trouble with your vision. SEEK IMMEDIATE MEDICAL CARE IF:  You develop a severe headache or  confusion.  You have unusual weakness, numbness, or feel faint.  You have severe chest or abdominal pain.  You vomit repeatedly.  You have trouble breathing. MAKE SURE YOU:   Understand these instructions.  Will watch your condition.  Will get help right away if you are not doing well or get  worse. Document Released: 01/19/2005 Document Revised: 01/24/2013 Document Reviewed: 11/11/2012 [facility] Patient Information 2015 [place], [place]. This information is not intended to replace advice given to you by your health care provider. Make sure you discuss any questions you have with your health care provider. DASH Eating Plan DASH stands for "Dietary Approaches to Stop Hypertension." The DASH eating plan is a healthy eating plan that has been shown to reduce high blood pressure (hypertension). Additional health benefits may include reducing the risk of type 2 diabetes mellitus, heart disease, and stroke. The DASH eating plan may also help with weight loss. WHAT DO I NEED TO KNOW ABOUT THE DASH EATING PLAN? For the DASH eating plan, you will follow these general guidelines:  Choose foods with a percent daily value for sodium of less than 5% (as listed on the food label).  Use salt-free seasonings or herbs instead of table salt or sea salt.  Check with your health care provider or pharmacist before using salt substitutes.  Eat lower-sodium products, often labeled as "lower sodium" or "no salt added."  Eat fresh foods.  Eat more vegetables, fruits, and low-fat dairy products.  Choose whole grains. Look for the word "whole" as the first word in the ingredient list.  Choose fish and skinless chicken or [place] more often than red meat. Limit fish, poultry, and meat to 6 oz (170 g) each day.  Limit sweets, desserts, sugars, and sugary drinks.  Choose heart-healthy fats.  Limit cheese to 1 oz (28 g) per day.  Eat more home-cooked food and less restaurant, buffet, and fast food.  Limit fried foods.  Cook foods using methods other than frying.  Limit canned vegetables. If you do use them, rinse them well to decrease the sodium.  When eating at a restaurant, ask that your food be prepared with less salt, or no salt if possible. WHAT FOODS CAN I EAT? Seek help from a dietitian for  individual calorie needs. Grains Whole grain or whole wheat bread. Brown rice. Whole grain or whole wheat pasta. Quinoa, bulgur, and whole grain cereals. Low-sodium cereals. Corn or whole wheat flour tortillas. Whole grain cornbread. Whole grain crackers. Low-sodium crackers. Vegetables Fresh or frozen vegetables (raw, steamed, roasted, or grilled). Low-sodium or reduced-sodium tomato and vegetable juices. Low-sodium or reduced-sodium tomato sauce and paste. Low-sodium or reduced-sodium canned vegetables.  Fruits All fresh, canned (in natural juice), or frozen fruits. Meat and Other Protein Products Ground beef (85% or leaner), grass-fed beef, or beef trimmed of fat. Skinless chicken or [place]. Ground chicken or [place]. Pork trimmed of fat. All fish and seafood. Eggs. Dried beans, peas, or lentils. Unsalted nuts and seeds. Unsalted canned beans. Dairy Low-fat dairy products, such as skim or 1% milk, 2% or reduced-fat cheeses, low-fat ricotta or cottage cheese, or plain low-fat yogurt. Low-sodium or reduced-sodium cheeses. Fats and Oils Tub margarines without trans fats. Light or reduced-fat mayonnaise and salad dressings (reduced sodium). Avocado. Safflower, olive, or canola oils. Natural peanut or almond butter. Other Unsalted popcorn and pretzels. The items listed above may not be a complete list of recommended foods or beverages. Contact your dietitian for more options. WHAT FOODS ARE NOT RECOMMENDED? Grains  White bread. White pasta. White rice. Refined cornbread. Bagels and croissants. Crackers that contain trans fat. Vegetables Creamed or fried vegetables. Vegetables in a cheese sauce. Regular canned vegetables. Regular canned tomato sauce and paste. Regular tomato and vegetable juices. Fruits Dried fruits. Canned fruit in light or heavy syrup. Fruit juice. Meat and Other Protein Products Fatty cuts of meat. Ribs, chicken wings, bacon, sausage, bologna, salami, chitterlings, fatback, hot  dogs, bratwurst, and packaged luncheon meats. Salted nuts and seeds. Canned beans with salt. Dairy Whole or 2% milk, cream, half-and-half, and cream cheese. Whole-fat or sweetened yogurt. Full-fat cheeses or blue cheese. Nondairy creamers and whipped toppings. Processed cheese, cheese spreads, or cheese curds. Condiments Onion and garlic salt, seasoned salt, table salt, and sea salt. Canned and packaged gravies. Worcestershire sauce. Tartar sauce. Barbecue sauce. Teriyaki sauce. Soy sauce, including reduced sodium. Steak sauce. Fish sauce. Oyster sauce. Cocktail sauce. Horseradish. Ketchup and mustard. Meat flavorings and tenderizers. Bouillon cubes. Hot sauce. Tabasco sauce. Marinades. Taco seasonings. Relishes. Fats and Oils Butter, stick margarine, lard, shortening, ghee, and bacon fat. Coconut, palm kernel, or palm oils. Regular salad dressings. Other Pickles and olives. Salted popcorn and pretzels. The items listed above may not be a complete list of foods and beverages to avoid. Contact your dietitian for more information. WHERE CAN I FIND MORE INFORMATION? National Heart, Lung, and Blood Institute: travelstabloid.com Document Released: 01/08/2011 Document Revised: 01/24/2013 Document Reviewed: 11/23/2012 [facility] Patient Information 2015 [place], [place]. This information is not intended to replace advice given to you by your health care provider. Make sure you discuss any questions you have with your health care provider.

## 2013-08-21 NOTE — Progress Notes (Signed)
   Subjective:    Patient ID: Thomas David, male    DOB: July 12, 1935, 78 y.o.   MRN: [id number]  Hypertension This is a new problem. The current episode started more than 1 year ago. Pertinent negatives include no chest pain. Risk factors for coronary artery disease include diabetes mellitus, dyslipidemia and male gender. Treatments tried: norvasc, lisinopril, lopressor.   Review meds-all of his medicines were reviewed. One of his medicine is much higher than what he can afford. He is something we can come up with something different. He  Sleep apnea sx-he has a fair amount of daytime fatigue feeling rundown. Does not get sleepy with driving. His wife states he snores at night and at times pauses with his breathing  Watching diet-he is trying to watch his diet has not been able to lose weight not exercising on a regular basis  hyperlip-he minimizes fried foods he does take his medicines.  Prediabetes-he tries to minimize starches as well as sugar   Review of Systems  Constitutional: Negative for activity change, appetite change and fatigue.  HENT: Negative for congestion.   Respiratory: Negative for cough.   Cardiovascular: Negative for chest pain.  Gastrointestinal: Negative for abdominal pain.  Endocrine: Negative for polydipsia and polyphagia.  Neurological: Negative for weakness.  Psychiatric/Behavioral: Negative for confusion.       Objective:   Physical Exam  Vitals reviewed. Constitutional: He appears well-nourished. No distress.  Cardiovascular: Normal rate, regular rhythm and normal heart sounds.   No murmur heard. Pulmonary/Chest: Effort normal and breath sounds normal. No respiratory distress.  Musculoskeletal: He exhibits no edema.  Lymphadenopathy:    He has no cervical adenopathy.  Neurological: He is alert.  Psychiatric: His behavior is normal.          Assessment & Plan:  Pt wants lower cost bladder medication-all of the other medicines have potential  for side effect I will call and discuss those with him to see if he has a preference  Will be moving to [place] within 6 to 9 months but will continue hhere for as long as feasible  LDL good-continue current medication  A1C noted - low starch diet, increase exercise  HTN good control  Cough due to Lisinopril - change to losartan  Fatigue - sleep apnea- do sleep study  Recheck blood pressure again in about 8 weeks  After discussion with the patient regarding the high cost of his bladder medicine we will try oxybutynin 5 mg extended release one per day if this is working for him he will call back for a 90 day prescription if he has side effects we will stop it and let us know stop the other bladder medicine.

## 2013-08-25 MED ORDER — OXYBUTYNIN CHLORIDE ER 5 MG PO TB24: 5.0000 mg | ORAL_TABLET | Freq: Every day | ORAL | Status: DC

## 2013-09-08 ENCOUNTER — Other Ambulatory Visit: Payer: Self-pay

## 2013-09-08 DIAGNOSIS — G4730 Sleep apnea, unspecified: Secondary | ICD-10-CM

## 2013-09-25 ENCOUNTER — Ambulatory Visit: Payer: Medicare Other | Attending: Family Medicine | Admitting: Sleep Medicine

## 2013-09-25 VITALS — Ht 68.0 in | Wt 228.0 lb

## 2013-09-25 DIAGNOSIS — G4733 Obstructive sleep apnea (adult) (pediatric): Secondary | ICD-10-CM | POA: Diagnosis not present

## 2013-09-25 DIAGNOSIS — G4730 Sleep apnea, unspecified: Secondary | ICD-10-CM | POA: Diagnosis not present

## 2013-09-25 DIAGNOSIS — R0609 Other forms of dyspnea: Secondary | ICD-10-CM | POA: Diagnosis present

## 2013-09-25 DIAGNOSIS — G4769 Other sleep related movement disorders: Secondary | ICD-10-CM | POA: Diagnosis not present

## 2013-09-25 DIAGNOSIS — R0989 Other specified symptoms and signs involving the circulatory and respiratory systems: Secondary | ICD-10-CM | POA: Diagnosis present

## 2013-09-25 DIAGNOSIS — G4710 Hypersomnia, unspecified: Secondary | ICD-10-CM | POA: Diagnosis not present

## 2013-09-28 NOTE — Sleep Study (Signed)
  [place] A. Merlene Laughter, MD     [email or web address]        NOCTURNAL POLYSOMNOGRAM    LOCATION: SLEEP LAB FACILITY:    PHYSICIAN: Karinna Beadles A. Merlene Laughter, M.D.   DATE OF STUDY: 09/25/2013.   REFERRING PHYSICIAN: Sallee Lange.  INDICATIONS: This is a 78 year old man who complains of snoring, difficulty sleeping and daytime sleepiness.  MEDICATIONS:  Prior to Admission medications   Medication Sig Start Date End Date Taking? Authorizing Provider  allopurinol (ZYLOPRIM) 300 MG tablet Take 300 mg by mouth every morning.     Historical Provider, MD  amLODipine (NORVASC) 5 MG tablet Take 5 mg by mouth every morning.     Historical Provider, MD  aspirin EC 81 MG tablet Take 81 mg by mouth at bedtime.    Historical Provider, MD  fluticasone (FLONASE) 50 MCG/ACT nasal spray Place 2 sprays into the nose at bedtime.     Historical Provider, MD  losartan (COZAAR) 50 MG tablet Take 1 tablet (50 mg total) by mouth daily. 08/21/13   Kathyrn Drown, MD  mesalamine (APRISO) 0.375 G 24 hr capsule Take 4 capsules (1.5 g total) by mouth daily. 01/31/13   Butch Penny, NP  metoprolol (LOPRESSOR) 50 MG tablet Take 2 tablets (100 mg total) by mouth at bedtime. 08/21/13   Kathyrn Drown, MD  omeprazole (PRILOSEC) 20 MG capsule Take 1 capsule (20 mg total) by mouth daily. 07/29/12   Mikey Kirschner, MD  OVER THE COUNTER MEDICATION Take 30 mLs by mouth every evening. Xango Juice.    Historical Provider, MD  oxybutynin (DITROPAN-XL) 5 MG 24 hr tablet Take 1 tablet (5 mg total) by mouth at bedtime. 08/25/13   Kathyrn Drown, MD  Probiotic Product ([place]) Take by mouth daily.    Historical Provider, MD  simvastatin (ZOCOR) 20 MG tablet Take 20 mg by mouth at bedtime.     Historical Provider, MD  vitamin B-12 (CYANOCOBALAMIN) 1000 MCG tablet Take 1,000 mcg by mouth 2 (two) times daily.    Historical Provider, MD      EPWORTH SLEEPINESS SCALE: 8.   BMI: 35.    ARCHITECTURAL SUMMARY: Total recording time was 366 minutes. Sleep efficiency 68 %. Sleep latency 13 minutes. REM latency 52 minutes. Stage NI 14 %, N2 76 % and N3 3 % and REM sleep 7 %.    RESPIRATORY DATA:  Baseline oxygen saturation is 95 %. The lowest saturation is 87 %. The diagnostic AHI is 30. The RDI is 35. The REM AHI is 22.  LIMB MOVEMENT SUMMARY: PLM index 48.   ELECTROCARDIOGRAM SUMMARY: Average heart rate is 62 with no significant dysrhythmias observed.   IMPRESSION:  1. Moderate obstructive sleep apnea syndrome. A formal CPAP titration recording is suggested or a home AutoPAP. 2. Severe periodic limb movement disorders sleep. Consider Dopamine agonist such as Mirapex or Requip if insomnia persist despite positive pressure treatment.  Thanks for this referral.  Freddie Dymek A. Merlene Laughter, M.D. Diplomat, Tax adviser of Sleep Medicine.

## 2013-10-04 ENCOUNTER — Encounter: Payer: Self-pay | Admitting: Family Medicine

## 2013-10-04 DIAGNOSIS — G4733 Obstructive sleep apnea (adult) (pediatric): Secondary | ICD-10-CM | POA: Insufficient documentation

## 2013-10-17 NOTE — Addendum Note (Signed)
Addended by: Dairl Ponder on: 10/17/2013 05:07 PM   Modules accepted: Orders

## 2013-11-27 ENCOUNTER — Encounter: Payer: Self-pay | Admitting: Family Medicine

## 2013-11-27 ENCOUNTER — Ambulatory Visit (INDEPENDENT_AMBULATORY_CARE_PROVIDER_SITE_OTHER): Payer: Medicare Other | Admitting: Family Medicine

## 2013-11-27 DIAGNOSIS — R7303 Prediabetes: Secondary | ICD-10-CM

## 2013-11-27 DIAGNOSIS — G4733 Obstructive sleep apnea (adult) (pediatric): Secondary | ICD-10-CM

## 2013-11-27 DIAGNOSIS — R7309 Other abnormal glucose: Secondary | ICD-10-CM | POA: Diagnosis not present

## 2013-11-27 DIAGNOSIS — I10 Essential (primary) hypertension: Secondary | ICD-10-CM | POA: Diagnosis not present

## 2013-11-27 DIAGNOSIS — Z23 Encounter for immunization: Secondary | ICD-10-CM

## 2013-11-27 LAB — POCT GLYCOSYLATED HEMOGLOBIN (HGB A1C): Hemoglobin A1C: 6.3

## 2013-11-27 MED ORDER — OXYBUTYNIN CHLORIDE ER 5 MG PO TB24: 5.0000 mg | ORAL_TABLET | Freq: Every day | ORAL | Status: DC

## 2013-11-27 MED ORDER — LOSARTAN POTASSIUM 50 MG PO TABS: 50.0000 mg | ORAL_TABLET | Freq: Every day | ORAL | Status: DC

## 2013-11-27 NOTE — Progress Notes (Signed)
   Subjective:    Patient ID: Thomas David, male    DOB: 1935-08-06, 78 y.o.   MRN: [id number]  Hypertension This is a chronic problem. The current episode started more than 1 year ago. Pertinent negatives include no chest pain. Risk factors for coronary artery disease include male gender, obesity and dyslipidemia (prediabetes). Treatments tried: norvasc, cozaar, lopressor. There are no compliance problems.     He also has sleep apnea he needs a split sleep study ordered. Apparently it was ordered once but for some reason didn't go beyond being ordered.  Review of Systems  Constitutional: Negative for activity change, appetite change and fatigue.  HENT: Negative for congestion.   Respiratory: Negative for cough.   Cardiovascular: Negative for chest pain.  Gastrointestinal: Negative for abdominal pain.  Endocrine: Negative for polydipsia and polyphagia.  Neurological: Negative for weakness.  Psychiatric/Behavioral: Negative for confusion.       Objective:   Physical Exam  Vitals reviewed. Constitutional: He appears well-nourished. No distress.  Cardiovascular: Normal rate, regular rhythm and normal heart sounds.   No murmur heard. Pulmonary/Chest: Effort normal and breath sounds normal. No respiratory distress.  Musculoskeletal: He exhibits no edema.  Lymphadenopathy:    He has no cervical adenopathy.  Neurological: He is alert.  Psychiatric: His behavior is normal.          Assessment & Plan:  HTN-good control continue current measures follow-up before or around March/April follow-up sooner problems  Prediabetes decent control will check A1c again in 3-4 months  Sleep study/titration study was ordered today.  Wellness exam late January early February

## 2013-11-30 ENCOUNTER — Ambulatory Visit (INDEPENDENT_AMBULATORY_CARE_PROVIDER_SITE_OTHER): Payer: Medicare Other | Admitting: Internal Medicine

## 2013-12-04 ENCOUNTER — Other Ambulatory Visit (HOSPITAL_COMMUNITY): Payer: Self-pay | Admitting: Respiratory Therapy

## 2013-12-04 ENCOUNTER — Ambulatory Visit (INDEPENDENT_AMBULATORY_CARE_PROVIDER_SITE_OTHER): Payer: Medicare Other | Admitting: Internal Medicine

## 2013-12-04 ENCOUNTER — Encounter (INDEPENDENT_AMBULATORY_CARE_PROVIDER_SITE_OTHER): Payer: Self-pay | Admitting: Internal Medicine

## 2013-12-04 VITALS — BP 128/78 | HR 76 | Temp 97.9°F | Resp 18 | Ht 69.0 in | Wt 227.6 lb

## 2013-12-04 DIAGNOSIS — K5080 Crohn's disease of both small and large intestine without complications: Secondary | ICD-10-CM | POA: Diagnosis not present

## 2013-12-04 DIAGNOSIS — R0681 Apnea, not elsewhere classified: Secondary | ICD-10-CM

## 2013-12-04 MED ORDER — MESALAMINE 1.2 G PO TBEC: 2.4000 g | DELAYED_RELEASE_TABLET | Freq: Two times a day (BID) | ORAL | Status: DC

## 2013-12-04 NOTE — Patient Instructions (Signed)
Colonoscopy to be scheduled in March 2016

## 2013-12-04 NOTE — Progress Notes (Signed)
Presenting complaint;  Follow-up for Crohn's disease.  Subjective:  Patient is 78 year old Caucasian male who has several year history of ileocolonic Crohn's disease, status post remote right hemicolectomy is here for scheduled visit. He was last seen in July 2014. He feels well. He has 2-3 bowel movements per day. 90% of his stools are formed. He denies abdominal pain melena or rectal bleeding. Heartburn is well controlled with therapy. He denies dysphagia. He decided not to relocate to [facility] and now is planning to move to retirement community in [facility] called [employer]. He has been too busy with personal affairs that he was not able to undergo surveillance colonoscopy this year. He would like to double colonoscopy early next year.he states he had blood work in July this year and was all normal. He also has insurance papers indicating that Doristine Johns would be covered and other mesalamine would not be.   Current Medications: Outpatient Encounter Prescriptions as of 12/04/2013  Medication Sig  . allopurinol (ZYLOPRIM) 300 MG tablet Take 300 mg by mouth every morning.   Marland Kitchen amLODipine (NORVASC) 5 MG tablet Take 5 mg by mouth every morning.   Marland Kitchen aspirin EC 81 MG tablet Take 81 mg by mouth at bedtime.  . fluticasone (FLONASE) 50 MCG/ACT nasal spray Place 2 sprays into the nose at bedtime.   Marland Kitchen losartan (COZAAR) 50 MG tablet Take 1 tablet (50 mg total) by mouth daily.  . metoprolol (LOPRESSOR) 50 MG tablet Take 2 tablets (100 mg total) by mouth at bedtime.  Marland Kitchen omeprazole (PRILOSEC) 20 MG capsule Take 1 capsule (20 mg total) by mouth daily.  Marland Kitchen OVER THE COUNTER MEDICATION Take 30 mLs by mouth every evening. Xango Juice.  Marland Kitchen oxybutynin (DITROPAN-XL) 5 MG 24 hr tablet Take 1 tablet (5 mg total) by mouth at bedtime.  . Probiotic Product ([place]) Take by mouth daily.  . simvastatin (ZOCOR) 20 MG tablet Take 20 mg by mouth at bedtime.   . vitamin B-12 (CYANOCOBALAMIN)  1000 MCG tablet Take 1,000 mcg by mouth 2 (two) times daily.  . [DISCONTINUED] mesalamine (APRISO) 0.375 G 24 hr capsule Take 4 capsules (1.5 g total) by mouth daily.  . mesalamine (LIALDA) 1.2 G EC tablet Take 2 tablets (2.4 g total) by mouth 2 (two) times daily.     Objective: Blood pressure 128/78, pulse 76, temperature 97.9 F (36.6 C), temperature source Oral, resp. rate 18, height 5' 9"  (1.753 m), weight 227 lb 9.6 oz (103.239 kg). Patient is alert and in no acute distress. Conjunctiva is pink. Sclera is nonicteric Oropharyngeal mucosa is normal. No neck masses or thyromegaly noted. Cardiac exam with regular rhythm normal S1 and S2. No murmur or gallop noted. Lungs are clear to auscultation. Abdomen is full but soft and non-tender without organomegaly or masses. He has small umbilical hernia which is completely reducible. No LE edema or clubbing noted.  Labs/studies Results: Lab data from 08/15/2013 reviewed WBC 7.1, H&H 14 and 40.9 and platelet count 208K BUN 13 and creatinine 0.81 Serum calcium 9.4 Glucose 105 Bilirubin 0.6, AP 72, AST 19, ALT 19 and albumin 4.1.    Assessment:  #1. Ileocolonic Crohn's disease.patient remains in remission. Best mesalamine would be Pentasa but is not covered and his plan. Last colonoscopy was in July 2010.  Plan:  Discontinue Apriso. Begin Lialda 2.4 g by mouth daily. Surveillance colonoscopy in March 2016. Office visit in 1 year.

## 2013-12-19 ENCOUNTER — Ambulatory Visit (INDEPENDENT_AMBULATORY_CARE_PROVIDER_SITE_OTHER): Payer: Medicare Other | Admitting: Family Medicine

## 2013-12-19 ENCOUNTER — Encounter: Payer: Self-pay | Admitting: Family Medicine

## 2013-12-19 VITALS — Temp 98.4°F | Ht 68.5 in | Wt 226.0 lb

## 2013-12-19 DIAGNOSIS — R197 Diarrhea, unspecified: Secondary | ICD-10-CM | POA: Diagnosis not present

## 2013-12-19 DIAGNOSIS — K5080 Crohn's disease of both small and large intestine without complications: Secondary | ICD-10-CM | POA: Diagnosis not present

## 2013-12-19 NOTE — Progress Notes (Signed)
   Subjective:    Patient ID: Thomas David, male    DOB: 05-Apr-1935, 78 y.o.   MRN: [id number]  Gastrophageal Reflux He complains of belching. He reports no abdominal pain, no chest pain, no choking or no coughing. gas, night sweats. This is a new problem. The current episode started in the past 7 days. The problem occurs constantly. The problem has been unchanged. Nothing aggravates the symptoms. Pertinent negatives include no fatigue. There are no known risk factors. He has tried nothing for the symptoms. The treatment provided no relief.   Patient states that he has no concerns at this time.  Hx of crohns  Sweat at night  Felt some better today Stools loose approx 4 times a day Non bloody no mucous Some abd pain Review of Systems  Constitutional: Negative for chills, activity change, appetite change and fatigue.  HENT: Negative for congestion.   Respiratory: Negative for cough and choking.   Cardiovascular: Negative for chest pain.  Gastrointestinal: Negative for abdominal pain.       Objective:   Physical Exam  Constitutional: He appears well-nourished. No distress.  Cardiovascular: Normal rate, regular rhythm and normal heart sounds.   No murmur heard. Pulmonary/Chest: Effort normal and breath sounds normal. No respiratory distress.  Abdominal: Soft.  Musculoskeletal: He exhibits no edema.  Lymphadenopathy:    He has no cervical adenopathy.  Neurological: He is alert.  Psychiatric: His behavior is normal.  Vitals reviewed.         Assessment & Plan:  Abdominal discomfort with cannot rule out the possibility of a secondary infection we need to do stool cultures and studies as well as Clostridium difficile PCR patient will need lab work as well await the results may need to review with his gastroenterologist as well Imodium maybe use no antibiotics for now, sedimentation rate ordered

## 2013-12-20 DIAGNOSIS — R197 Diarrhea, unspecified: Secondary | ICD-10-CM | POA: Diagnosis not present

## 2013-12-20 LAB — HEPATIC FUNCTION PANEL
ALT: 17 U/L (ref 0–53)
AST: 15 U/L (ref 0–37)
Albumin: 3.4 g/dL — ABNORMAL LOW (ref 3.5–5.2)
Alkaline Phosphatase: 83 U/L (ref 39–117)
BILIRUBIN DIRECT: 0.1 mg/dL (ref 0.0–0.3)
Indirect Bilirubin: 0.5 mg/dL (ref 0.2–1.2)
Total Bilirubin: 0.6 mg/dL (ref 0.2–1.2)
Total Protein: 6.6 g/dL (ref 6.0–8.3)

## 2013-12-20 LAB — CBC WITH DIFFERENTIAL/PLATELET
Basophils Absolute: 0 10*3/uL (ref 0.0–0.1)
Basophils Relative: 0 % (ref 0–1)
Eosinophils Absolute: 0.1 10*3/uL (ref 0.0–0.7)
Eosinophils Relative: 1 % (ref 0–5)
HEMATOCRIT: 36.3 % — AB (ref 39.0–52.0)
Hemoglobin: 12.2 g/dL — ABNORMAL LOW (ref 13.0–17.0)
LYMPHS ABS: 1.8 10*3/uL (ref 0.7–4.0)
Lymphocytes Relative: 17 % (ref 12–46)
MCH: 28.8 pg (ref 26.0–34.0)
MCHC: 33.6 g/dL (ref 30.0–36.0)
MCV: 85.8 fL (ref 78.0–100.0)
MONOS PCT: 12 % (ref 3–12)
MPV: 9.7 fL (ref 9.4–12.4)
Monocytes Absolute: 1.2 10*3/uL — ABNORMAL HIGH (ref 0.1–1.0)
NEUTROS ABS: 7.2 10*3/uL (ref 1.7–7.7)
Neutrophils Relative %: 70 % (ref 43–77)
PLATELETS: 281 10*3/uL (ref 150–400)
RBC: 4.23 MIL/uL (ref 4.22–5.81)
RDW: 14.3 % (ref 11.5–15.5)
WBC: 10.3 10*3/uL (ref 4.0–10.5)

## 2013-12-20 LAB — SEDIMENTATION RATE: Sed Rate: 76 mm/hr — ABNORMAL HIGH (ref 0–16)

## 2013-12-21 LAB — FECAL LACTOFERRIN, QUANT: Lactoferrin: POSITIVE

## 2013-12-22 LAB — CLOSTRIDIUM DIFFICILE BY PCR: CDIFFPCR: NOT DETECTED

## 2013-12-24 LAB — STOOL CULTURE

## 2013-12-25 NOTE — Progress Notes (Signed)
Dr. Laural Golden called on nov 23

## 2013-12-26 NOTE — Addendum Note (Signed)
Addended by: Carmelina Noun on: 12/26/2013 05:18 PM   Modules accepted: Orders

## 2014-01-03 ENCOUNTER — Telehealth (INDEPENDENT_AMBULATORY_CARE_PROVIDER_SITE_OTHER): Payer: Self-pay | Admitting: *Deleted

## 2014-01-03 NOTE — Telephone Encounter (Signed)
Thomas David would like to speak with Dr. Laural Golden if he would please return his call at [phone].  Right after Thomas David was seen here, he got sick and though it was a virus. Called PCP, Dr. Wolfgang Phoenix, bowels were lose. Was sent to have blood work done then one week later received a call from Dr. Wolfgang Phoenix. He advised him, he spoke with Dr. Laural Golden and he will be doing more lab work in a couple of weeks. [name] said the blood work will be due next week.

## 2014-01-04 NOTE — Telephone Encounter (Signed)
Note per Dr.Luking :  Abdominal discomfort with cannot rule out the possibility of a secondary infection we need to do stool cultures and studies as well as Clostridium difficile PCR patient will need lab work as well await the results may need to review with his gastroenterologist as well Imodium maybe use no antibiotics for now, sedimentation rate ordered.  I will address this with Dr.Rehman.

## 2014-01-09 ENCOUNTER — Ambulatory Visit: Payer: Medicare Other | Attending: Family Medicine | Admitting: Sleep Medicine

## 2014-01-09 DIAGNOSIS — R0683 Snoring: Secondary | ICD-10-CM | POA: Diagnosis not present

## 2014-01-09 DIAGNOSIS — Z6832 Body mass index (BMI) 32.0-32.9, adult: Secondary | ICD-10-CM | POA: Insufficient documentation

## 2014-01-09 DIAGNOSIS — R0681 Apnea, not elsewhere classified: Secondary | ICD-10-CM

## 2014-01-09 DIAGNOSIS — Z79899 Other long term (current) drug therapy: Secondary | ICD-10-CM | POA: Insufficient documentation

## 2014-01-09 DIAGNOSIS — G4733 Obstructive sleep apnea (adult) (pediatric): Secondary | ICD-10-CM | POA: Insufficient documentation

## 2014-01-09 DIAGNOSIS — G4730 Sleep apnea, unspecified: Secondary | ICD-10-CM | POA: Diagnosis not present

## 2014-01-09 NOTE — Telephone Encounter (Signed)
Per Dr.Rehman  - Dr.Luking is ordering the test,and it appears that the following lab orders have been put in for Sed-Rate , CRP, Stool Culture C-Reactive Protein Fluid.

## 2014-01-15 NOTE — Sleep Study (Signed)
  [place] A. Merlene Laughter, MD     [email or web address]        NOCTURNAL POLYSOMNOGRAM    LOCATION: SLEEP LAB FACILITY: [facility]   PHYSICIAN: Jenisha Faison A. Merlene Laughter, M.D.   DATE OF STUDY: 01/09/2014.   REFERRING PHYSICIAN: Sallee Lange.   INDICATIONS: The patient is a 78 year old presents with snoring and witnessed apnea. There is a previous study documenting obstructive sleep apnea syndrome.  MEDICATIONS:  Prior to Admission medications   Medication Sig Start Date End Date Taking? Authorizing Provider  allopurinol (ZYLOPRIM) 300 MG tablet Take 300 mg by mouth every morning.     Historical Provider, MD  amLODipine (NORVASC) 5 MG tablet Take 5 mg by mouth every morning.     Historical Provider, MD  aspirin EC 81 MG tablet Take 81 mg by mouth at bedtime.    Historical Provider, MD  fluticasone (FLONASE) 50 MCG/ACT nasal spray Place 2 sprays into the nose at bedtime.     Historical Provider, MD  losartan (COZAAR) 50 MG tablet Take 1 tablet (50 mg total) by mouth daily. 11/27/13   Kathyrn Drown, MD  mesalamine (LIALDA) 1.2 G EC tablet Take 2 tablets (2.4 g total) by mouth 2 (two) times daily. 12/04/13   Rogene Houston, MD  metoprolol (LOPRESSOR) 50 MG tablet Take 2 tablets (100 mg total) by mouth at bedtime. 08/21/13   Kathyrn Drown, MD  omeprazole (PRILOSEC) 20 MG capsule Take 1 capsule (20 mg total) by mouth daily. 07/29/12   Mikey Kirschner, MD  OVER THE COUNTER MEDICATION Take 30 mLs by mouth every evening. Xango Juice.    Historical Provider, MD  oxybutynin (DITROPAN-XL) 5 MG 24 hr tablet Take 1 tablet (5 mg total) by mouth at bedtime. 11/27/13   Kathyrn Drown, MD  Probiotic Product ([place]) Take by mouth daily.    Historical Provider, MD  simvastatin (ZOCOR) 20 MG tablet Take 20 mg by mouth at bedtime.     Historical Provider, MD  vitamin B-12 (CYANOCOBALAMIN) 1000 MCG tablet Take 1,000 mcg by mouth 2 (two) times daily.    Historical Provider, MD      EPWORTH SLEEPINESS SCALE: 4.   BMI: 32.   ARCHITECTURAL SUMMARY: Total recording time was 408 minutes. Sleep efficiency 76 %. Sleep latency 7 minutes. REM latency 88 minutes. Stage NI 10 %, N2 65 % and N3 4 % and REM sleep 21 %.    RESPIRATORY DATA:  Baseline oxygen saturation is 91 %. The lowest saturation is 89 %. The patient was placed on positive pressure starting at 4 and increase to 9. The optimal pressure is 8 with resolution of obstructive events and descent tolerance.  LIMB MOVEMENT SUMMARY: PLM index 109.   ELECTROCARDIOGRAM SUMMARY: Average heart rate is 70 with no significant dysrhythmias observed.   IMPRESSION:  1. Obstructive sleep apnea syndrome which responds well to a CPAP of 8. 2. Severe periodic limb movement disorder. Consider dopamine agonist if clinically indicated.  Thanks for this referral.  Lynnda Wiersma A. Merlene Laughter, M.D. Diplomat, Tax adviser of Sleep Medicine.

## 2014-01-23 ENCOUNTER — Telehealth: Payer: Self-pay | Admitting: Family Medicine

## 2014-01-23 NOTE — Telephone Encounter (Signed)
His sleep study does show that he has sleep apnea which responds to a CPAP pressure of 8. We could certainly go ahead and give order for this. The patient may 12 discuss with his insurance company to find out if there is a preferred supplier of CPAP machine. If patient is uncertain if he really wants to do the CPAP treatment I would recommend an office visit in January to discuss. Please discuss with him find out what he would like to do. More than likely he will need a copy of his sleep titration study as well as a prescription for the CPAP machine in order to get it covered. A copy of the test is being forwarded to you

## 2014-01-24 ENCOUNTER — Telehealth: Payer: Self-pay | Admitting: *Deleted

## 2014-01-24 NOTE — Telephone Encounter (Signed)
Notified patient his sleep study does show that he has sleep apnea which responds to a CPAP pressure of 8. We could certainly go ahead and give order for this. The patient may discuss with his insurance company to find out if there is a preferred supplier of CPAP machine. If patient is uncertain if he really wants to do the CPAP treatment  would recommend an office visit in January to discuss. Patient states that he will contact his insurance first and see what is exactly covered and let us know.

## 2014-01-24 NOTE — Telephone Encounter (Signed)
Faxed rx for cpap machine and supplies and copy of sleep study to [facility].

## 2014-01-24 NOTE — Telephone Encounter (Signed)
Pt states he called his insurance company about the cpap machine and pt states to send rx and copy of sleep study to wherever Dr. Nicki Reaper reccommends. Also pt states he was very sick around thanksgiving and tried to reach out to Dr. Nicki Reaper and Dr. Laural Golden to find out if he needs any additional test or follow ups. He states he is very disappointed that noone has reached out to him. He is doing better now.  i looked back in note. Note states pt was aware to do bloodwork in 3 weeks and if abnormal then he would be referred to Dr. Dereck Leep.

## 2014-01-25 NOTE — Telephone Encounter (Signed)
Script, demographics, and copy of sleep study faxed to [facility].

## 2014-01-25 NOTE — Telephone Encounter (Signed)
Mariann Laster called from [place], they are unable to provide patient with a CPAP due to they are not contracted with Medicare to do so due to him living here in [facility],  Please send order to [facility] (as they have a contract and are able to provide CPAP to pt)

## 2014-02-02 ENCOUNTER — Other Ambulatory Visit: Payer: Self-pay | Admitting: Family Medicine

## 2014-02-05 NOTE — Telephone Encounter (Signed)
May refill his medications 90 days with 2 refills

## 2014-02-08 NOTE — Telephone Encounter (Signed)
I did speak with Dr. Melony Overly and I also spoke with the patient back at that time. At that time I recommended per the gastroenterologist to repeat the sedimentation rate and C-reactive protein in several weeks. I would encourage Thomas David to do these lab work. Dr. Melony Overly had stated that if he were elevated on repeat then he would do further testing if they were normal then that would be very reassuring. Please connect with the patient I have been monitoring to see if the lab work was completed and so far has not been. Please asked the patient to do this lab work. We will inform him of the results when it comes back.

## 2014-02-09 NOTE — Telephone Encounter (Signed)
Patient notified and verbalized understanding that he needed to get BW done. Will get it done next week.

## 2014-02-13 DIAGNOSIS — K5080 Crohn's disease of both small and large intestine without complications: Secondary | ICD-10-CM | POA: Diagnosis not present

## 2014-02-13 DIAGNOSIS — R197 Diarrhea, unspecified: Secondary | ICD-10-CM | POA: Diagnosis not present

## 2014-02-13 LAB — C-REACTIVE PROTEIN: CRP: 1.6 mg/dL — ABNORMAL HIGH (ref <0.60)

## 2014-02-14 LAB — SEDIMENTATION RATE: Sed Rate: 12 mm/hr (ref 0–16)

## 2014-03-01 ENCOUNTER — Encounter: Payer: Self-pay | Admitting: Family Medicine

## 2014-03-01 ENCOUNTER — Ambulatory Visit (INDEPENDENT_AMBULATORY_CARE_PROVIDER_SITE_OTHER): Payer: Medicare Other | Admitting: Family Medicine

## 2014-03-01 VITALS — BP 122/86 | Ht 69.0 in | Wt 229.0 lb

## 2014-03-01 DIAGNOSIS — Z79899 Other long term (current) drug therapy: Secondary | ICD-10-CM | POA: Diagnosis not present

## 2014-03-01 DIAGNOSIS — N400 Enlarged prostate without lower urinary tract symptoms: Secondary | ICD-10-CM

## 2014-03-01 DIAGNOSIS — G4733 Obstructive sleep apnea (adult) (pediatric): Secondary | ICD-10-CM

## 2014-03-01 DIAGNOSIS — R000 Tachycardia, unspecified: Secondary | ICD-10-CM | POA: Diagnosis not present

## 2014-03-01 DIAGNOSIS — I10 Essential (primary) hypertension: Secondary | ICD-10-CM | POA: Diagnosis not present

## 2014-03-01 DIAGNOSIS — E785 Hyperlipidemia, unspecified: Secondary | ICD-10-CM | POA: Diagnosis not present

## 2014-03-01 DIAGNOSIS — Z0000 Encounter for general adult medical examination without abnormal findings: Secondary | ICD-10-CM

## 2014-03-01 DIAGNOSIS — D510 Vitamin B12 deficiency anemia due to intrinsic factor deficiency: Secondary | ICD-10-CM | POA: Diagnosis not present

## 2014-03-01 DIAGNOSIS — R7309 Other abnormal glucose: Secondary | ICD-10-CM | POA: Diagnosis not present

## 2014-03-01 DIAGNOSIS — R7303 Prediabetes: Secondary | ICD-10-CM

## 2014-03-01 DIAGNOSIS — K219 Gastro-esophageal reflux disease without esophagitis: Secondary | ICD-10-CM

## 2014-03-01 LAB — HEPATIC FUNCTION PANEL
ALT: 17 U/L (ref 0–53)
AST: 20 U/L (ref 0–37)
Albumin: 4.1 g/dL (ref 3.5–5.2)
Alkaline Phosphatase: 79 U/L (ref 39–117)
BILIRUBIN TOTAL: 0.4 mg/dL (ref 0.2–1.2)
Bilirubin, Direct: 0.1 mg/dL (ref 0.0–0.3)
Indirect Bilirubin: 0.3 mg/dL (ref 0.2–1.2)
Total Protein: 7.4 g/dL (ref 6.0–8.3)

## 2014-03-01 LAB — BASIC METABOLIC PANEL
BUN: 13 mg/dL (ref 6–23)
CO2: 26 meq/L (ref 19–32)
CREATININE: 0.85 mg/dL (ref 0.50–1.35)
Calcium: 9.2 mg/dL (ref 8.4–10.5)
Chloride: 103 mEq/L (ref 96–112)
GLUCOSE: 103 mg/dL — AB (ref 70–99)
Potassium: 4.7 mEq/L (ref 3.5–5.3)
SODIUM: 142 meq/L (ref 135–145)

## 2014-03-01 LAB — HEMOGLOBIN A1C
HEMOGLOBIN A1C: 6.5 % — AB (ref <5.7)
Mean Plasma Glucose: 140 mg/dL — ABNORMAL HIGH (ref <117)

## 2014-03-01 LAB — LIPID PANEL
CHOLESTEROL: 138 mg/dL (ref 0–200)
HDL: 40 mg/dL (ref >39)
LDL Cholesterol: 58 mg/dL (ref 0–99)
Total CHOL/HDL Ratio: 3.5 Ratio
Triglycerides: 202 mg/dL — ABNORMAL HIGH (ref <150)
VLDL: 40 mg/dL (ref 0–40)

## 2014-03-01 MED ORDER — LOSARTAN POTASSIUM 50 MG PO TABS: 50.0000 mg | ORAL_TABLET | Freq: Every day | ORAL | Status: DC

## 2014-03-01 MED ORDER — AMLODIPINE BESYLATE 5 MG PO TABS: ORAL_TABLET | ORAL | Status: DC

## 2014-03-01 MED ORDER — ALLOPURINOL 300 MG PO TABS: 300.0000 mg | ORAL_TABLET | Freq: Every morning | ORAL | Status: DC

## 2014-03-01 MED ORDER — SIMVASTATIN 20 MG PO TABS: ORAL_TABLET | ORAL | Status: DC

## 2014-03-01 MED ORDER — OXYBUTYNIN CHLORIDE ER 5 MG PO TB24: ORAL_TABLET | ORAL | Status: DC

## 2014-03-01 MED ORDER — OMEPRAZOLE 20 MG PO CPDR: DELAYED_RELEASE_CAPSULE | ORAL | Status: DC

## 2014-03-01 MED ORDER — METOPROLOL TARTRATE 50 MG PO TABS: ORAL_TABLET | ORAL | Status: DC

## 2014-03-01 NOTE — Progress Notes (Signed)
Subjective:    Patient ID: Thomas David, male    DOB: 02-20-1935, 79 y.o.   MRN: [id number]  HPI AWV- Annual Wellness Visit  The patient was seen for their annual wellness visit. The patient's past medical history, surgical history, and family history were reviewed. Pertinent vaccines were reviewed ( tetanus, pneumonia, shingles, flu) The patient's medication list was reviewed and updated.  The height and weight were entered. The patient's current BMI is: 18  Cognitive screening was completed. Outcome of Mini - Cog: Yes  Falls within the past 6 months:No  Current tobacco usage: No (All patients who use tobacco were given written and verbal information on quitting)  Recent listing of emergency department/hospitalizations over the past year were reviewed.  current specialist the patient sees on a regular basis: Dr. Ananias David    Medicare annual wellness visit patient questionnaire was reviewed.  A written screening schedule for the patient for the next 5-10 years was given. Appropriate discussion of followup regarding next visit was discussed.       Review of Systems     Objective:   Physical Exam  Constitutional: He appears well-developed and well-nourished.  HENT:  Head: Normocephalic and atraumatic.  Right Ear: External ear normal.  Left Ear: External ear normal.  Nose: Nose normal.  Mouth/Throat: Oropharynx is clear and moist.  Eyes: Conjunctivae are normal. Right eye exhibits no discharge. Left eye exhibits no discharge.  Neck: Normal range of motion. Neck supple. No thyromegaly present.  Cardiovascular: Regular rhythm and normal heart sounds.   No murmur heard. Pulmonary/Chest: Effort normal and breath sounds normal. No respiratory distress. He has no wheezes.  Abdominal: Soft. Bowel sounds are normal. He exhibits no distension and no mass. There is no tenderness.  Genitourinary: Penis normal.  Rectum very tight due to previous inflammatory bowel disease changes  prostate feels normal  Musculoskeletal: Normal range of motion. He exhibits no edema.  Lymphadenopathy:    He has no cervical adenopathy.  Neurological: He is alert. He exhibits normal muscle tone.  Skin: Skin is warm and dry. No erythema.  Psychiatric: He has a normal mood and affect. His behavior is normal. Judgment normal.          Assessment & Plan:  Extremely nice gentleman here today for wellness plus also multiple other health issues.  Thomas David has a colonoscopy coming up. He is up-to-date on all immunizations. Cognitive doing good. Safety with driving good. No depression no falls. Follow-up again in one years time  1. Routine general medical examination at a health care facility Please see above  2. Essential hypertension Blood pressure under decent control acute kidney function sodium potassium no need to be on losartan and lisinopril. Use losartan only. - Basic metabolic panel  3. Obstructive sleep apnea Patient is going to be establishing CPAP. [facility] of the testing [facility].  4. Prediabetes A1c ordered. Low starch diet regular physical activity. Await results - Hemoglobin A1c  5. Hyperlipemia Low-fat diet await the results - Lipid panel  6. Pernicious anemia Will look at CBC he is been anemic before in the past  7. Tachycardia On today's physical exam he is tachycardic when the EKG was completed EKG overall looks good. - PR ELECTROCARDIOGRAM, COMPLETE; Standing - PR ELECTROCARDIOGRAM, COMPLETE  8. High risk medication use Due to multiple medications and underlying health issues check liver profile - Hepatic function panel  I do not recommend PSA testing. Explained to patient he agrees.

## 2014-03-04 ENCOUNTER — Encounter: Payer: Self-pay | Admitting: Family Medicine

## 2014-03-16 ENCOUNTER — Encounter (HOSPITAL_COMMUNITY): Payer: Self-pay | Admitting: Emergency Medicine

## 2014-03-16 ENCOUNTER — Encounter: Payer: Self-pay | Admitting: Family Medicine

## 2014-03-16 ENCOUNTER — Encounter (HOSPITAL_COMMUNITY)
Admission: EM | Disposition: A | Discharge status: Home / Self Care | Payer: Self-pay | Source: Home / Self Care | Attending: Emergency Medicine

## 2014-03-16 ENCOUNTER — Telehealth: Payer: Self-pay | Admitting: *Deleted

## 2014-03-16 ENCOUNTER — Emergency Department (HOSPITAL_COMMUNITY)
Admission: EM | Admit: 2014-03-16 | Discharge: 2014-03-16 | Disposition: A | Discharge status: Home / Self Care | Payer: Medicare Other | Attending: Internal Medicine | Admitting: Internal Medicine

## 2014-03-16 ENCOUNTER — Emergency Department (HOSPITAL_COMMUNITY): Payer: Medicare Other

## 2014-03-16 DIAGNOSIS — R0989 Other specified symptoms and signs involving the circulatory and respiratory systems: Secondary | ICD-10-CM | POA: Diagnosis not present

## 2014-03-16 DIAGNOSIS — K449 Diaphragmatic hernia without obstruction or gangrene: Secondary | ICD-10-CM | POA: Insufficient documentation

## 2014-03-16 DIAGNOSIS — K219 Gastro-esophageal reflux disease without esophagitis: Secondary | ICD-10-CM | POA: Insufficient documentation

## 2014-03-16 DIAGNOSIS — Y9389 Activity, other specified: Secondary | ICD-10-CM | POA: Diagnosis not present

## 2014-03-16 DIAGNOSIS — T18128A Food in esophagus causing other injury, initial encounter: Secondary | ICD-10-CM | POA: Insufficient documentation

## 2014-03-16 DIAGNOSIS — Z7951 Long term (current) use of inhaled steroids: Secondary | ICD-10-CM | POA: Diagnosis not present

## 2014-03-16 DIAGNOSIS — K222 Esophageal obstruction: Secondary | ICD-10-CM | POA: Diagnosis not present

## 2014-03-16 DIAGNOSIS — I10 Essential (primary) hypertension: Secondary | ICD-10-CM | POA: Diagnosis not present

## 2014-03-16 DIAGNOSIS — Y998 Other external cause status: Secondary | ICD-10-CM | POA: Insufficient documentation

## 2014-03-16 DIAGNOSIS — R05 Cough: Secondary | ICD-10-CM | POA: Diagnosis not present

## 2014-03-16 DIAGNOSIS — Z79899 Other long term (current) drug therapy: Secondary | ICD-10-CM | POA: Diagnosis not present

## 2014-03-16 DIAGNOSIS — X58XXXA Exposure to other specified factors, initial encounter: Secondary | ICD-10-CM | POA: Diagnosis not present

## 2014-03-16 DIAGNOSIS — T18108A Unspecified foreign body in esophagus causing other injury, initial encounter: Secondary | ICD-10-CM | POA: Diagnosis present

## 2014-03-16 DIAGNOSIS — R0981 Nasal congestion: Secondary | ICD-10-CM | POA: Insufficient documentation

## 2014-03-16 DIAGNOSIS — K5090 Crohn's disease, unspecified, without complications: Secondary | ICD-10-CM | POA: Diagnosis not present

## 2014-03-16 DIAGNOSIS — Y9289 Other specified places as the place of occurrence of the external cause: Secondary | ICD-10-CM | POA: Insufficient documentation

## 2014-03-16 DIAGNOSIS — T18100A Unspecified foreign body in esophagus causing compression of trachea, initial encounter: Secondary | ICD-10-CM | POA: Diagnosis not present

## 2014-03-16 HISTORY — PX: ESOPHAGOGASTRODUODENOSCOPY: SHX5428

## 2014-03-16 LAB — CBC WITH DIFFERENTIAL/PLATELET
Basophils Absolute: 0 10*3/uL (ref 0.0–0.1)
Basophils Relative: 0 % (ref 0–1)
Eosinophils Absolute: 0.1 10*3/uL (ref 0.0–0.7)
Eosinophils Relative: 2 % (ref 0–5)
HCT: 39.6 % (ref 39.0–52.0)
HEMOGLOBIN: 13.1 g/dL (ref 13.0–17.0)
LYMPHS ABS: 2.2 10*3/uL (ref 0.7–4.0)
LYMPHS PCT: 29 % (ref 12–46)
MCH: 28.8 pg (ref 26.0–34.0)
MCHC: 33.1 g/dL (ref 30.0–36.0)
MCV: 87 fL (ref 78.0–100.0)
MONO ABS: 0.7 10*3/uL (ref 0.1–1.0)
MONOS PCT: 9 % (ref 3–12)
NEUTROS ABS: 4.6 10*3/uL (ref 1.7–7.7)
Neutrophils Relative %: 60 % (ref 43–77)
Platelets: 196 10*3/uL (ref 150–400)
RBC: 4.55 MIL/uL (ref 4.22–5.81)
RDW: 14.8 % (ref 11.5–15.5)
WBC: 7.6 10*3/uL (ref 4.0–10.5)

## 2014-03-16 LAB — BASIC METABOLIC PANEL
Anion gap: 7 (ref 5–15)
BUN: 15 mg/dL (ref 6–23)
CHLORIDE: 109 mmol/L (ref 96–112)
CO2: 25 mmol/L (ref 19–32)
Calcium: 8.9 mg/dL (ref 8.4–10.5)
Creatinine, Ser: 0.82 mg/dL (ref 0.50–1.35)
GFR calc Af Amer: >90 mL/min (ref >90)
GFR, EST NON AFRICAN AMERICAN: 84 mL/min — AB (ref >90)
GLUCOSE: 152 mg/dL — AB (ref 70–99)
POTASSIUM: 3.8 mmol/L (ref 3.5–5.1)
Sodium: 141 mmol/L (ref 135–145)

## 2014-03-16 SURGERY — EGD (ESOPHAGOGASTRODUODENOSCOPY)
Anesthesia: Moderate Sedation

## 2014-03-16 MED ORDER — MEPERIDINE HCL 50 MG/ML IJ SOLN
INTRAMUSCULAR | Status: DC | PRN
  Administered 2014-03-16 (×2): 25 mg via INTRAVENOUS

## 2014-03-16 MED ORDER — BUTAMBEN-TETRACAINE-BENZOCAINE 2-2-14 % EX AERO
INHALATION_SPRAY | CUTANEOUS | Status: DC | PRN
  Administered 2014-03-16: 2 via TOPICAL

## 2014-03-16 MED ORDER — ONDANSETRON HCL 4 MG/2ML IJ SOLN
4.0000 mg | Freq: Once | INTRAMUSCULAR | Status: AC
  Administered 2014-03-16: 4 mg via INTRAVENOUS
  Filled 2014-03-16: qty 2

## 2014-03-16 MED ORDER — MEPERIDINE HCL 50 MG/ML IJ SOLN
INTRAMUSCULAR | Status: AC
  Filled 2014-03-16: qty 1

## 2014-03-16 MED ORDER — MIDAZOLAM HCL 5 MG/5ML IJ SOLN
INTRAMUSCULAR | Status: DC | PRN
  Administered 2014-03-16: 1 mg via INTRAVENOUS
  Administered 2014-03-16: 2 mg via INTRAVENOUS
  Administered 2014-03-16: 1 mg via INTRAVENOUS
  Administered 2014-03-16 (×2): 2 mg via INTRAVENOUS

## 2014-03-16 MED ORDER — SODIUM CHLORIDE 0.9 % IV SOLN
INTRAVENOUS | Status: DC
  Administered 2014-03-16 (×2): via INTRAVENOUS

## 2014-03-16 MED ORDER — GLUCAGON HCL RDNA (DIAGNOSTIC) 1 MG IJ SOLR
1.0000 mg | Freq: Once | INTRAMUSCULAR | Status: AC
  Administered 2014-03-16: 1 mg via INTRAVENOUS
  Filled 2014-03-16: qty 1

## 2014-03-16 MED ORDER — STERILE WATER FOR IRRIGATION IR SOLN
Status: DC | PRN
  Administered 2014-03-16: 15:00:00

## 2014-03-16 MED ORDER — MIDAZOLAM HCL 5 MG/5ML IJ SOLN
INTRAMUSCULAR | Status: AC
  Filled 2014-03-16: qty 10

## 2014-03-16 MED ORDER — SODIUM CHLORIDE 0.9 % IV BOLUS (SEPSIS)
500.0000 mL | Freq: Once | INTRAVENOUS | Status: AC
  Administered 2014-03-16: 500 mL via INTRAVENOUS

## 2014-03-16 NOTE — ED Notes (Signed)
Pt states he was eating steak at dinner last night and has been spitting up since then. Pt states he has been unable to swallow medications, water or jello. Speech clear. Airway patent.

## 2014-03-16 NOTE — ED Notes (Signed)
Patient escorted to Endoscopy via [facility] by RN.

## 2014-03-16 NOTE — H&P (Signed)
Unknown Thomas David is an 79 y.o. male.   Chief Complaint: Patient's here for EGD with foreign body removal with esophageal dilation. HPI: [name] was 79 year old Caucasian male was chronic GERD maintained on PPI and was doing well until last evening when he developed dysphagia while he was eating steak. He was not able to get relief. He was finally able to go to bed around 3 AM. He woke up this morning and had difficulty eating Jell-O and his medications. Therefore call Dr. Lance Sell office. He was advised to come to emergency room and Dr. Rogene Houston contacted me. Patient has had his esophagus dilated several years ago. He has history of ileocolonic Crohn disease.  Past Medical History  Diagnosis Date  . Crohn's disease   . Hypertension       GERD  Past Surgical History  Procedure Laterality Date  . Thyroidectomy    . Prostate surgery    . Colon resection      No family history on file. Social History:  reports that he has never smoked. He does not have any smokeless tobacco history on file. He reports that he does not drink alcohol or use illicit drugs.  Allergies: No Known Allergies  Medications Prior to Admission  Medication Sig Dispense Refill  . allopurinol (ZYLOPRIM) 300 MG tablet Take 300 mg by mouth daily.    Marland Kitchen amLODipine (NORVASC) 5 MG tablet Take 5 mg by mouth daily.    . fluticasone (FLONASE) 50 MCG/ACT nasal spray Place 2 sprays into both nostrils daily.     Marland Kitchen LIALDA 1.2 G EC tablet Take 1.2 g by mouth daily with breakfast.     . lisinopril (PRINIVIL,ZESTRIL) 2.5 MG tablet Take 2.5 mg by mouth daily.    Marland Kitchen losartan (COZAAR) 50 MG tablet Take 50 mg by mouth daily.    . metoprolol (LOPRESSOR) 50 MG tablet Take 50 mg by mouth 2 (two) times daily.    Marland Kitchen omeprazole (PRILOSEC) 20 MG capsule Take 20 mg by mouth daily.    Marland Kitchen oxybutynin (DITROPAN-XL) 5 MG 24 hr tablet Take 5 mg by mouth daily.    . simvastatin (ZOCOR) 20 MG tablet Take 20 mg by mouth daily.      Results for orders placed  or performed during the hospital encounter of 03/16/14 (from the past 48 hour(s))  CBC with Differential/Platelet     Status: None   Collection Time: 03/16/14  1:29 PM  Result Value Ref Range   WBC 7.6 4.0 - 10.5 K/uL   RBC 4.55 4.22 - 5.81 MIL/uL   Hemoglobin 13.1 13.0 - 17.0 g/dL   HCT 39.6 39.0 - 52.0 %   MCV 87.0 78.0 - 100.0 fL   MCH 28.8 26.0 - 34.0 pg   MCHC 33.1 30.0 - 36.0 g/dL   RDW 14.8 11.5 - 15.5 %   Platelets 196 150 - 400 K/uL   Neutrophils Relative % 60 43 - 77 %   Neutro Abs 4.6 1.7 - 7.7 K/uL   Lymphocytes Relative 29 12 - 46 %   Lymphs Abs 2.2 0.7 - 4.0 K/uL   Monocytes Relative 9 3 - 12 %   Monocytes Absolute 0.7 0.1 - 1.0 K/uL   Eosinophils Relative 2 0 - 5 %   Eosinophils Absolute 0.1 0.0 - 0.7 K/uL   Basophils Relative 0 0 - 1 %   Basophils Absolute 0.0 0.0 - 0.1 K/uL   Dg Neck Soft Tissue  03/16/2014   CLINICAL DATA:  Feels like  last nights steak is sticking in throat.  EXAM: NECK SOFT TISSUES - 1+ VIEW  COMPARISON:  None.  FINDINGS: There is no evidence for pharyngeal or laryngeal foreign body. No extraluminal gas in the neck. No prevertebral swelling.  Cervical carotid atherosclerosis.  IMPRESSION: No evidence of foreign body.   Electronically Signed   By: Monte Fantasia M.D.   On: 03/16/2014 12:41    ROS  Blood pressure 164/84, pulse 93, temperature 98.6 F (37 C), temperature source Oral, resp. rate 19, height 5' 9"  (1.753 m), weight 224 lb (101.606 kg), SpO2 97 %. Physical Exam  Constitutional: He appears well-developed and well-nourished.  HENT:  Mouth/Throat: Oropharynx is clear and moist.  Eyes: Conjunctivae are normal. No scleral icterus.  Neck: No thyromegaly present.  Cardiovascular: Normal rate, regular rhythm and normal heart sounds.   No murmur heard. Respiratory: Effort normal and breath sounds normal.  GI: Soft. He exhibits no distension and no mass. There is no tenderness.  Musculoskeletal: He exhibits no edema.  Lymphadenopathy:     He has no cervical adenopathy.  Neurological: He is alert.  Skin: Skin is warm and dry.     Assessment/Plan Foreign body esophagus. EGD with foreign body removal and ED.  REHMAN,NAJEEB U 03/16/2014, 2:25 PM

## 2014-03-16 NOTE — ED Notes (Signed)
Endoscopy ready for patient.

## 2014-03-16 NOTE — Op Note (Signed)
EGD PROCEDURE REPORT  PATIENT:  Thomas David  MR#:  [id number] Birthdate:  05/26/1935, 79 y.o., male Endoscopist:  Dr. Rogene Houston, MD Referred By:  Dr. Fredia Sorrow, MD  Procedure Date: 03/16/2014  Procedure:   EGD with foreign body removal and esophageal dilation.  Indications:  Patient is 79 year old Caucasian male who presents with signs and symptoms of foreign body esophagus. Symptoms began around 6 PM yesterday when he was eating steak. He has not been able to swallow food or pills. He has been spitting up saliva frequently. He was seen in emergency room and referred for therapeutic EGD.            Informed Consent:  The risks, benefits, alternatives & imponderables which include, but are not limited to, bleeding, infection, perforation, drug reaction and potential missed lesion have been reviewed.  The potential for biopsy, lesion removal, esophageal dilation, etc. have also been discussed.  Questions have been answered.  All parties agreeable.  Please see history & physical in medical record for more information.  Medications:  Demerol 50 mg IV Versed 8 mg IV Cetacaine spray topically for oropharyngeal anesthesia  Description of procedure:  The endoscope was introduced through the mouth and advanced to the second portion of the duodenum without difficulty or limitations. The mucosal surfaces were surveyed very carefully during advancement of the scope and upon withdrawal.  Findings:  Esophagus:  Air-fluid level noted and esophageal body implying distal obstruction. Multiple pieces of meat for noted in distal esophagus. Most of these were removed using a Roth net. Large piece of meat was impacted at distal esophagus. It was caught with Talon forceps and broken into pieces and the larger piece passed distally. He had narrowing about 3 cm proximal to GE junction with changes of esophagitis felt to be secondary to impaction. GEJ:  40 cm Hiatus:  44 cm Stomach:  Stomach was empty  other than food bolus which was pushed distally. Stomach distended very well with insufflation. Folds in the proximal stomach were normal. Examination of mucosa at body and antrum was normal. Pyloric channel was patent. Annulus fundus and cardia were unremarkable. Duodenum:  Normal bulbar and post bulbar mucosa.  Therapeutic/Diagnostic Maneuvers Performed:   Foreign body was removed from the esophagus as described above. Once examinations are completed esophageal stricture was dilated with balloon dilator to 18 mm. Balloon dilator was passed with the scope. Guidewire was pushed to gastric lumen. Balloon dilator was positioned across the stricture and insufflated to dilator 15 mm was maintained for a few seconds and passed distally. Balloon was deflated and withdrawn. Post dilation I was able to pass the scope across this area without resistance.  Complications:  None  Impression: Impacted foreign body at distal esophagus. Foreign body removed with combination of febrile and restless pushed distally. Stricture with inflammatory changes noted to proximal to GE junction at site of bolus obstruction. It was dilated to 18 mm with a balloon. Small sliding hiatal hernia.  Recommendations:  Standard instructions given. Soft foods for 48 hours. Patient advised to chew food thoroughly before swallowing. Will consider repeat EGD and dilation if indicated in 6-8 weeks.  Latyra Jaye U  03/16/2014  3:03 PM  CC: Dr. Sallee Lange, MD & Dr. Rayne Du ref. provider found

## 2014-03-16 NOTE — Discharge Instructions (Signed)
Resume usual medications. Soft foods for 48 hours. Remember to chew  food thoroughly before you swallow. No driving for 24 hours.      Esophagogastroduodenoscopy Care After Refer to this sheet in the next few weeks. These instructions provide you with information on caring for yourself after your procedure. Your caregiver may also give you more specific instructions. Your treatment has been planned according to current medical practices, but problems sometimes occur. Call your caregiver if you have any problems or questions after your procedure.  HOME CARE INSTRUCTIONS  Do not eat or drink anything until the numbing medicine (local anesthetic) has worn off and your gag reflex has returned. You will know that the local anesthetic has worn off when you can swallow comfortably.  Do not drive for 12 hours after the procedure or as directed by your caregiver.  Only take medicines as directed by your caregiver. SEEK MEDICAL CARE IF:   You cannot stop coughing.  You are not urinating at all or less than usual. SEEK IMMEDIATE MEDICAL CARE IF:  You have difficulty swallowing.  You cannot eat or drink.  You have worsening throat or chest pain.  You have dizziness, lightheadedness, or you faint.  You have nausea or vomiting.  You have chills.  You have a fever.  You have severe abdominal pain.  You have black, tarry, or bloody stools. Document Released: 01/06/2012 Document Reviewed: 01/06/2012 [facility] Patient Information 2015 [place]. This information is not intended to replace advice given to you by your health care provider. Make sure you discuss any questions you have with your health care provider.

## 2014-03-16 NOTE — Telephone Encounter (Signed)
Pt said he cannot swallow anything. Started last night when he was eating steak. Cannot swallow his meds, cannot swallow jello. He was up all night last night.  Per Dr. Richardson Landry, told pt to go straight to the ER. Sounds like obstruction in esophagus, and will need to see GI doctor.  Pt verbalized understanding.

## 2014-03-16 NOTE — ED Provider Notes (Addendum)
CSN: [id number]     Arrival date & time 03/16/14  1050 History  This chart was scribed for Fredia Sorrow, MD by Zola Button, ED Scribe. This patient was seen in room APA01/AP[facility]01 and the patient's care was started at 12:40 PM.       Chief Complaint  Patient presents with  . Foreign Body   Patient is a 79 y.o. male presenting with foreign body. The history is provided by the patient. No language interpreter was used.  Foreign Body Location:  Swallowed Pain severity:  No pain Duration:  1 day Timing:  Constant Progression:  Unchanged Chronicity:  New Ineffective treatments:  Eating and drinking Associated symptoms: congestion, cough and trouble swallowing   Associated symptoms: no abdominal pain, no nausea and no vomiting    HPI Comments: Thomas David is a 79 y.o. male with a hx of thyroidectomy and Crohn's disease who presents to the Emergency Department complaining of difficulty swallowing since last night after eating a steak at dinner around 6:00 PM. Since then, he has been spitting out pieces of steak; he feels as if steak is lodged in his esophagus at his sternal notch. He also tried eating jello, applesauce, drinking water and taking his medications, but he has not been able to hold them down. Patient states that saliva has been coming up. He denies hx of esophagus stretching surgery. He notes that he is recovering from a cold.  PCP: Dr. Wolfgang Phoenix GI Doctor: Dr. Shonna Chock  Past Medical History  Diagnosis Date  . Crohn's disease   . Hypertension    Past Surgical History  Procedure Laterality Date  . Thyroidectomy    . Prostate surgery    . Colon resection     No family history on file. History  Substance Use Topics  . Smoking status: Never Smoker   . Smokeless tobacco: Not on file  . Alcohol Use: No    Review of Systems  Constitutional: Negative for fever and chills.  HENT: Positive for congestion and trouble swallowing.   Eyes: Negative for visual disturbance.   Respiratory: Positive for cough. Negative for shortness of breath.   Cardiovascular: Negative for chest pain and leg swelling.  Gastrointestinal: Negative for nausea, vomiting, abdominal pain and diarrhea.  Genitourinary: Negative for dysuria.  Musculoskeletal: Negative for back pain.  Skin: Negative for rash.  Hematological: Does not bruise/bleed easily.  Psychiatric/Behavioral: Negative for confusion.      Allergies  Review of patient's allergies indicates no known allergies.  Home Medications   Prior to Admission medications   Medication Sig Start Date End Date Taking? Authorizing Provider  allopurinol (ZYLOPRIM) 300 MG tablet Take 300 mg by mouth daily. 02/02/14  Yes Historical Provider, MD  amLODipine (NORVASC) 5 MG tablet Take 5 mg by mouth daily. 02/02/14  Yes Historical Provider, MD  fluticasone (FLONASE) 50 MCG/ACT nasal spray Place 2 sprays into both nostrils daily.  02/02/14  Yes Historical Provider, MD  LIALDA 1.2 G EC tablet Take 1.2 g by mouth daily with breakfast.  02/02/14  Yes Historical Provider, MD  lisinopril (PRINIVIL,ZESTRIL) 2.5 MG tablet Take 2.5 mg by mouth daily. 02/06/14  Yes Historical Provider, MD  losartan (COZAAR) 50 MG tablet Take 50 mg by mouth daily. 02/02/14  Yes Historical Provider, MD  metoprolol (LOPRESSOR) 50 MG tablet Take 50 mg by mouth 2 (two) times daily. 02/02/14  Yes Historical Provider, MD  omeprazole (PRILOSEC) 20 MG capsule Take 20 mg by mouth daily. 02/02/14  Yes Historical Provider, MD  oxybutynin (DITROPAN-XL) 5 MG 24 hr tablet Take 5 mg by mouth daily. 02/02/14  Yes Historical Provider, MD  simvastatin (ZOCOR) 20 MG tablet Take 20 mg by mouth daily. 02/02/14  Yes Historical Provider, MD   BP 164/84 mmHg  Pulse 93  Temp(Src) 98.6 F (37 C) (Oral)  Resp 19  Ht 5' 9"  (1.753 m)  Wt 224 lb (101.606 kg)  BMI 33.06 kg/m2  SpO2 97% Physical Exam  Constitutional: He is oriented to person, place, and time. He appears well-developed and well-nourished.  No distress.  HENT:  Head: Normocephalic and atraumatic.  Mouth/Throat: Oropharynx is clear and moist. No oropharyngeal exudate.  Eyes: Pupils are equal, round, and reactive to light. No scleral icterus.  Neck: Neck supple.  Cardiovascular: Normal rate, regular rhythm and normal heart sounds.   No murmur heard. Pulmonary/Chest: Effort normal and breath sounds normal. No respiratory distress. He has no wheezes. He has no rales.  CTAB.  Abdominal: Soft. Bowel sounds are normal. There is no tenderness.  Musculoskeletal: He exhibits no edema.  No ankle swelling.  Neurological: He is alert and oriented to person, place, and time. No cranial nerve deficit.  Skin: Skin is warm and dry. No rash noted.  Psychiatric: He has a normal mood and affect. His behavior is normal.  Nursing note and vitals reviewed.   ED Course  Procedures  DIAGNOSTIC STUDIES: Oxygen Saturation is 99% on room air, normal by my interpretation.    COORDINATION OF CARE: 12:50 PM-Discussed treatment plan which includes labs, XR and medications with pt at bedside and pt agreed to plan.    Labs Review Labs Reviewed  CBC WITH DIFFERENTIAL/PLATELET  BASIC METABOLIC PANEL   Results for orders placed or performed during the hospital encounter of 03/16/14  CBC with Differential/Platelet  Result Value Ref Range   WBC 7.6 4.0 - 10.5 K/uL   RBC 4.55 4.22 - 5.81 MIL/uL   Hemoglobin 13.1 13.0 - 17.0 g/dL   HCT 39.6 39.0 - 52.0 %   MCV 87.0 78.0 - 100.0 fL   MCH 28.8 26.0 - 34.0 pg   MCHC 33.1 30.0 - 36.0 g/dL   RDW 14.8 11.5 - 15.5 %   Platelets 196 150 - 400 K/uL   Neutrophils Relative % 60 43 - 77 %   Neutro Abs 4.6 1.7 - 7.7 K/uL   Lymphocytes Relative 29 12 - 46 %   Lymphs Abs 2.2 0.7 - 4.0 K/uL   Monocytes Relative 9 3 - 12 %   Monocytes Absolute 0.7 0.1 - 1.0 K/uL   Eosinophils Relative 2 0 - 5 %   Eosinophils Absolute 0.1 0.0 - 0.7 K/uL   Basophils Relative 0 0 - 1 %   Basophils Absolute 0.0 0.0 - 0.1 K/uL      Imaging Review Dg Neck Soft Tissue  03/16/2014   CLINICAL DATA:  Feels like last nights steak is sticking in throat.  EXAM: NECK SOFT TISSUES - 1+ VIEW  COMPARISON:  None.  FINDINGS: There is no evidence for pharyngeal or laryngeal foreign body. No extraluminal gas in the neck. No prevertebral swelling.  Cervical carotid atherosclerosis.  IMPRESSION: No evidence of foreign body.   Electronically Signed   By: Monte Fantasia M.D.   On: 03/16/2014 12:41     EKG Interpretation   Date/Time:  Friday March 16 2014 13:46:19 EST Ventricular Rate:  77 PR Interval:  171 QRS Duration: 99 QT Interval:  383 QTC Calculation: 433 R Axis:  50 Text Interpretation:  Sinus rhythm Confirmed by Alfa Leibensperger  MD, Callista Hoh  [phone]) on 03/16/2014 1:52:27 PM      MDM   Final diagnoses:  Food impaction of esophagus, initial encounter  Food impaction of esophagus    Patient with state that get stuck in the esophagus last night around 6 PM. Has been unable to swallow any liquids or Jell-O or anything since then. Saliva is coming up with intermittently. Trial of glucagon 1 mg IV without any change. Patient still spitting up saliva. Discussed with his GI doctor Dr. Melony Overly. Dr. Melony Overly will take him to the endoscopy suite in the duodenum upper endoscopy. Most likely has food still impacted in the esophagus.  Labs are pending. EKG without any acute findings.  Patient has a history of Crohn's disease so inflammatory changes in the esophagus or possible.  I personally performed the services described in this documentation, which was scribed in my presence. The recorded information has been reviewed and is accurate.     Fredia Sorrow, MD 03/16/14 1353  Fredia Sorrow, MD 03/16/14 1404

## 2014-03-19 ENCOUNTER — Encounter: Payer: Self-pay | Admitting: Family Medicine

## 2014-03-28 ENCOUNTER — Encounter (INDEPENDENT_AMBULATORY_CARE_PROVIDER_SITE_OTHER): Payer: Self-pay | Admitting: *Deleted

## 2014-04-05 ENCOUNTER — Other Ambulatory Visit (INDEPENDENT_AMBULATORY_CARE_PROVIDER_SITE_OTHER): Payer: Self-pay | Admitting: *Deleted

## 2014-04-05 ENCOUNTER — Encounter (HOSPITAL_COMMUNITY): Payer: Self-pay | Admitting: Internal Medicine

## 2014-04-05 ENCOUNTER — Telehealth (INDEPENDENT_AMBULATORY_CARE_PROVIDER_SITE_OTHER): Payer: Self-pay | Admitting: *Deleted

## 2014-04-05 DIAGNOSIS — Z1211 Encounter for screening for malignant neoplasm of colon: Secondary | ICD-10-CM

## 2014-04-05 DIAGNOSIS — Z86010 Personal history of colonic polyps: Secondary | ICD-10-CM

## 2014-04-05 DIAGNOSIS — K222 Esophageal obstruction: Secondary | ICD-10-CM

## 2014-04-05 NOTE — Telephone Encounter (Signed)
Patient needs trilyte 

## 2014-04-06 MED ORDER — PEG 3350-KCL-NA BICARB-NACL 420 G PO SOLR: 4000.0000 mL | Freq: Once | ORAL | Status: DC

## 2014-04-25 ENCOUNTER — Telehealth (INDEPENDENT_AMBULATORY_CARE_PROVIDER_SITE_OTHER): Payer: Self-pay | Admitting: *Deleted

## 2014-04-25 NOTE — Telephone Encounter (Signed)
Referring MD/PCP: scott luking   Procedure: tcs/egd/ed  Reason/Indication:  Hx polyps, esophageal stricture  Has patient had this procedure before?  Yes TCS--08/2008 (paper chart) & EGD--03/2014  If so, when, by whom and where?    Is there a family history of colon cancer?  no  Who?  What age when diagnosed?    Is patient diabetic?   no      Does patient have prosthetic heart valve?  no  Do you have a pacemaker?  no  Has patient ever had endocarditis? no  Has patient had joint replacement within last 12 months?  no  Does patient tend to be constipated or take laxatives? no  Is patient on Coumadin, Plavix and/or Aspirin? yes  Medications: see epic  Allergies: see epic  Medication Adjustment: asa 2 days  Procedure date & time: 05/23/14 at 1200

## 2014-04-30 ENCOUNTER — Ambulatory Visit (INDEPENDENT_AMBULATORY_CARE_PROVIDER_SITE_OTHER): Payer: Medicare Other | Admitting: Family Medicine

## 2014-04-30 ENCOUNTER — Encounter: Payer: Self-pay | Admitting: Family Medicine

## 2014-04-30 VITALS — BP 138/80 | Ht 69.0 in | Wt 222.0 lb

## 2014-04-30 DIAGNOSIS — R7303 Prediabetes: Secondary | ICD-10-CM

## 2014-04-30 DIAGNOSIS — E119 Type 2 diabetes mellitus without complications: Secondary | ICD-10-CM | POA: Diagnosis not present

## 2014-04-30 DIAGNOSIS — I10 Essential (primary) hypertension: Secondary | ICD-10-CM | POA: Diagnosis not present

## 2014-04-30 DIAGNOSIS — G4733 Obstructive sleep apnea (adult) (pediatric): Secondary | ICD-10-CM

## 2014-04-30 DIAGNOSIS — R7309 Other abnormal glucose: Secondary | ICD-10-CM | POA: Diagnosis not present

## 2014-04-30 LAB — POCT GLYCOSYLATED HEMOGLOBIN (HGB A1C): HEMOGLOBIN A1C: 6.3

## 2014-04-30 NOTE — Telephone Encounter (Signed)
agree

## 2014-04-30 NOTE — Progress Notes (Signed)
   Subjective:    Patient ID: Thomas David, male    DOB: 1935-04-03, 79 y.o.   MRN: [id number]  HPIGot cpap in January. Follow up on cpap. Wife states he does not snore now. Better for the wife. Still waking for urination. No daytime sleepiness Usually takes a nap every other day for 30 minutes  Requesting orders for bloodwork. Was told to repeat in May.    Had urgent EGD due to blockage will have stretching in the coming future  Colonoscopy in April Review of Systems  Constitutional: Negative for activity change, appetite change and fatigue.  HENT: Negative for congestion.   Respiratory: Negative for cough.   Cardiovascular: Negative for chest pain.  Gastrointestinal: Negative for abdominal pain.  Endocrine: Negative for polydipsia and polyphagia.  Neurological: Negative for weakness.  Psychiatric/Behavioral: Negative for confusion.       Objective:   Physical Exam  Constitutional: He appears well-nourished. No distress.  Cardiovascular: Normal rate, regular rhythm and normal heart sounds.   No murmur heard. Pulmonary/Chest: Effort normal and breath sounds normal. No respiratory distress.  Musculoskeletal: He exhibits no edema.  Lymphadenopathy:    He has no cervical adenopathy.  Neurological: He is alert.  Psychiatric: His behavior is normal.  Vitals reviewed.         Assessment & Plan:  Check A1C  Follow up July  A1c overall looks good. We will do lab work comprehensive in July Blood pressure under good control Patient tolerating sleep apnea treatment well Prediabetes patient needs to watch diet stay physically active

## 2014-05-23 ENCOUNTER — Encounter (HOSPITAL_COMMUNITY): Payer: Self-pay | Admitting: *Deleted

## 2014-05-23 ENCOUNTER — Encounter (HOSPITAL_COMMUNITY)
Admission: RE | Disposition: A | Discharge status: Home / Self Care | Payer: Self-pay | Source: Ambulatory Visit | Attending: Internal Medicine

## 2014-05-23 ENCOUNTER — Ambulatory Visit (HOSPITAL_COMMUNITY)
Admission: RE | Admit: 2014-05-23 | Discharge: 2014-05-23 | Disposition: A | Discharge status: Home / Self Care | Payer: Medicare Other | Source: Ambulatory Visit | Attending: Internal Medicine | Admitting: Internal Medicine

## 2014-05-23 DIAGNOSIS — Z7982 Long term (current) use of aspirin: Secondary | ICD-10-CM | POA: Diagnosis not present

## 2014-05-23 DIAGNOSIS — Z8719 Personal history of other diseases of the digestive system: Secondary | ICD-10-CM | POA: Diagnosis not present

## 2014-05-23 DIAGNOSIS — I10 Essential (primary) hypertension: Secondary | ICD-10-CM | POA: Diagnosis not present

## 2014-05-23 DIAGNOSIS — M4690 Unspecified inflammatory spondylopathy, site unspecified: Secondary | ICD-10-CM | POA: Diagnosis not present

## 2014-05-23 DIAGNOSIS — G8929 Other chronic pain: Secondary | ICD-10-CM | POA: Diagnosis not present

## 2014-05-23 DIAGNOSIS — Z86010 Personal history of colonic polyps: Secondary | ICD-10-CM | POA: Insufficient documentation

## 2014-05-23 DIAGNOSIS — M13871 Other specified arthritis, right ankle and foot: Secondary | ICD-10-CM | POA: Diagnosis not present

## 2014-05-23 DIAGNOSIS — K633 Ulcer of intestine: Secondary | ICD-10-CM | POA: Diagnosis not present

## 2014-05-23 DIAGNOSIS — K529 Noninfective gastroenteritis and colitis, unspecified: Secondary | ICD-10-CM | POA: Insufficient documentation

## 2014-05-23 DIAGNOSIS — E780 Pure hypercholesterolemia: Secondary | ICD-10-CM | POA: Insufficient documentation

## 2014-05-23 DIAGNOSIS — Z79899 Other long term (current) drug therapy: Secondary | ICD-10-CM | POA: Insufficient documentation

## 2014-05-23 DIAGNOSIS — M109 Gout, unspecified: Secondary | ICD-10-CM | POA: Diagnosis not present

## 2014-05-23 DIAGNOSIS — K5730 Diverticulosis of large intestine without perforation or abscess without bleeding: Secondary | ICD-10-CM | POA: Diagnosis not present

## 2014-05-23 DIAGNOSIS — M13872 Other specified arthritis, left ankle and foot: Secondary | ICD-10-CM | POA: Diagnosis not present

## 2014-05-23 DIAGNOSIS — K449 Diaphragmatic hernia without obstruction or gangrene: Secondary | ICD-10-CM | POA: Insufficient documentation

## 2014-05-23 DIAGNOSIS — K219 Gastro-esophageal reflux disease without esophagitis: Secondary | ICD-10-CM | POA: Diagnosis not present

## 2014-05-23 DIAGNOSIS — Z7951 Long term (current) use of inhaled steroids: Secondary | ICD-10-CM | POA: Diagnosis not present

## 2014-05-23 DIAGNOSIS — K222 Esophageal obstruction: Secondary | ICD-10-CM

## 2014-05-23 DIAGNOSIS — M545 Low back pain: Secondary | ICD-10-CM | POA: Insufficient documentation

## 2014-05-23 DIAGNOSIS — Z9049 Acquired absence of other specified parts of digestive tract: Secondary | ICD-10-CM | POA: Insufficient documentation

## 2014-05-23 DIAGNOSIS — K6389 Other specified diseases of intestine: Secondary | ICD-10-CM | POA: Diagnosis not present

## 2014-05-23 DIAGNOSIS — R1310 Dysphagia, unspecified: Secondary | ICD-10-CM | POA: Diagnosis not present

## 2014-05-23 HISTORY — PX: ESOPHAGEAL DILATION: SHX303

## 2014-05-23 HISTORY — PX: ESOPHAGOGASTRODUODENOSCOPY: SHX5428

## 2014-05-23 HISTORY — PX: COLONOSCOPY: SHX5424

## 2014-05-23 SURGERY — COLONOSCOPY
Anesthesia: Moderate Sedation

## 2014-05-23 MED ORDER — MIDAZOLAM HCL 5 MG/5ML IJ SOLN
INTRAMUSCULAR | Status: DC | PRN
  Administered 2014-05-23 (×2): 2 mg via INTRAVENOUS
  Administered 2014-05-23 (×2): 1 mg via INTRAVENOUS

## 2014-05-23 MED ORDER — MEPERIDINE HCL 50 MG/ML IJ SOLN
INTRAMUSCULAR | Status: AC
  Filled 2014-05-23: qty 1

## 2014-05-23 MED ORDER — BUTAMBEN-TETRACAINE-BENZOCAINE 2-2-14 % EX AERO
INHALATION_SPRAY | CUTANEOUS | Status: DC | PRN
  Administered 2014-05-23: 1 via TOPICAL

## 2014-05-23 MED ORDER — STERILE WATER FOR IRRIGATION IR SOLN
Status: DC | PRN
  Administered 2014-05-23 (×2)

## 2014-05-23 MED ORDER — SODIUM CHLORIDE 0.9 % IV SOLN
INTRAVENOUS | Status: DC
  Administered 2014-05-23: 1000 mL via INTRAVENOUS

## 2014-05-23 MED ORDER — MIDAZOLAM HCL 5 MG/5ML IJ SOLN
INTRAMUSCULAR | Status: AC
  Filled 2014-05-23: qty 10

## 2014-05-23 MED ORDER — MEPERIDINE HCL 50 MG/ML IJ SOLN
INTRAMUSCULAR | Status: DC | PRN
  Administered 2014-05-23 (×2): 25 mg via INTRAVENOUS

## 2014-05-23 NOTE — Discharge Instructions (Signed)
Resume aspirin on 05/25/2014. Resume other medications as before. Resume usual diet. No driving for 24 hours.  Esophagogastroduodenoscopy Care After Refer to this sheet in the next few weeks. These instructions provide you with information on caring for yourself after your procedure. Your caregiver may also give you more specific instructions. Your treatment has been planned according to current medical practices, but problems sometimes occur. Call your caregiver if you have any problems or questions after your procedure.  HOME CARE INSTRUCTIONS  Do not eat or drink anything until the numbing medicine (local anesthetic) has worn off and your gag reflex has returned. You will know that the local anesthetic has worn off when you can swallow comfortably.  Do not drive for 12 hours after the procedure or as directed by your caregiver.  Only take medicines as directed by your caregiver. SEEK MEDICAL CARE IF:   You cannot stop coughing.  You are not urinating at all or less than usual. SEEK IMMEDIATE MEDICAL CARE IF:  You have difficulty swallowing.  You cannot eat or drink.  You have worsening throat or chest pain.  You have dizziness, lightheadedness, or you faint.  You have nausea or vomiting.  You have chills.  You have a fever.  You have severe abdominal pain.  You have black, tarry, or bloody stools.    Colonoscopy, Care After These instructions give you information on caring for yourself after your procedure. Your doctor may also give you more specific instructions. Call your doctor if you have any problems or questions after your procedure. HOME CARE  Do not drive for 24 hours.  Do not sign important papers or use machinery for 24 hours.  You may shower.  You may go back to your usual activities, but go slower for the first 24 hours.  Take rest breaks often during the first 24 hours.  Walk around or use warm packs on your belly (abdomen) if you have belly  cramping or gas.  Drink enough fluids to keep your pee (urine) clear or pale yellow.  Resume your normal diet. Avoid heavy or fried foods.  Avoid drinking alcohol for 24 hours or as told by your doctor.  Only take medicines as told by your doctor. If a tissue sample (biopsy) was taken during the procedure:   Do not take aspirin or blood thinners for 7 days, or as told by your doctor.  Do not drink alcohol for 7 days, or as told by your doctor.  Eat soft foods for the first 24 hours. GET HELP IF: You still have a small amount of blood in your poop (stool) 2-3 days after the procedure. GET HELP RIGHT AWAY IF:  You have more than a small amount of blood in your poop.  You see clumps of tissue (blood clots) in your poop.  Your belly is puffy (swollen).  You feel sick to your stomach (nauseous) or throw up (vomit).  You have a fever.  You have belly pain that gets worse and medicine does not help. MAKE SURE YOU:  Understand these instructions.  Will watch your condition.  Will get help right away if you are not doing well or get worse.  Hiatal Hernia A hiatal hernia occurs when part of your stomach slides above the muscle that separates your abdomen from your chest (diaphragm). You can be born with a hiatal hernia (congenital), or it may develop over time. In almost all cases of hiatal hernia, only the top part of the stomach pushes  through.  Many people have a hiatal hernia with no symptoms. The larger the hernia, the more likely that you will have symptoms. In some cases, a hiatal hernia allows stomach acid to flow back into the tube that carries food from your mouth to your stomach (esophagus). This may cause heartburn symptoms. Severe heartburn symptoms may mean you have developed a condition called gastroesophageal reflux disease (GERD).  CAUSES  Hiatal hernias are caused by a weakness in the opening (hiatus) where your esophagus passes through your diaphragm to attach to  the upper part of your stomach. You may be born with a weakness in your hiatus, or a weakness can develop. RISK FACTORS Older age is a major risk factor for a hiatal hernia. Anything that increases pressure on your diaphragm can also increase your risk of a hiatal hernia. This includes:  Pregnancy.  Excess weight.  Frequent constipation. SIGNS AND SYMPTOMS  People with a hiatal hernia often have no symptoms. If symptoms develop, they are almost always caused by GERD. They may include:  Heartburn.  Belching.  Indigestion.  Trouble swallowing.  Coughing or wheezing.  Sore throat.  Hoarseness.  Chest pain. DIAGNOSIS  A hiatal hernia is sometimes found during an exam for another problem. Your health care provider may suspect a hiatal hernia if you have symptoms of GERD. Tests may be done to diagnose GERD. These may include:  X-rays of your stomach or chest.  An upper gastrointestinal (GI) series. This is an X-ray exam of your GI tract involving the use of a chalky liquid that you swallow. The liquid shows up clearly on the X-ray.  Endoscopy. This is a procedure to look into your stomach using a thin, flexible tube that has a tiny camera and light on the end of it. TREATMENT  If you have no symptoms, you may not need treatment. If you have symptoms, treatment may include:  Dietary and lifestyle changes to help reduce GERD symptoms.  Medicines. These may include:  Over-the-counter antacids.  Medicines that make your stomach empty more quickly.  Medicines that block the production of stomach acid (H2 blockers).  Stronger medicines to reduce stomach acid (proton pump inhibitors).  You may need surgery to repair the hernia if other treatments are not helping. HOME CARE INSTRUCTIONS   Take all medicines as directed by your health care provider.  Quit smoking, if you smoke.  Try to achieve and maintain a healthy body weight.  Eat frequent small meals instead of three  large meals a day. This keeps your stomach from getting too full.  Eat slowly.  Do not lie down right after eating.  Do noteat 1-2 hours before bed.   Do not drink beverages with caffeine. These include cola, coffee, cocoa, and tea.  Do not drink alcohol.  Avoid foods that can make symptoms of GERD worse. These may include:  Fatty foods.  Citrus fruits.  Other foods and drinks that contain acid.  Avoid putting pressure on your belly. Anything that puts pressure on your belly increases the amount of acid that may be pushed up into your esophagus.   Avoid bending over, especially after eating.  Raise the head of your bed by putting blocks under the legs. This keeps your head and esophagus higher than your stomach.  Do not wear tight clothing around your chest or stomach.  Try not to strain when having a bowel movement, when urinating, or when lifting heavy objects. SEEK MEDICAL CARE IF:  Your symptoms are  not controlled with medicines or lifestyle changes.  You are having trouble swallowing.  You have coughing or wheezing that will not go away. SEEK IMMEDIATE MEDICAL CARE IF:  Your pain is getting worse.  Your pain spreads to your arms, neck, jaw, teeth, or back.  You have shortness of breath.  You sweat for no reason.  You feel sick to your stomach (nauseous) or vomit.  You vomit blood.  You have bright red blood in your stools.  You have black, tarry stools.

## 2014-05-23 NOTE — Op Note (Signed)
EGD PROCEDURE REPORT  PATIENT:  Thomas David  MR#:  [id number] Birthdate:  Feb 20, 1935, 79 y.o., male Endoscopist:  Dr. Rogene Houston, MD Referred By:  Dr. Sallee Lange, MD  Procedure Date: 05/23/2014  Procedure:   EGD, ED & Colonoscopy  Indications:  Patient is 79 year old Caucasian male was chronic GERD and presents with solid food dysphagia. He was here in [place] 2016 with foreign body esophagus. Foreign body was removed. He was noted to have inflammatory stricture was dilated to 18 mm with a balloon. He is returning for repeat dilation. He has history of Crohn disease and an polyp. He is therefore undergoing surveillance colonoscopy.            Informed Consent:  The risks, benefits, alternatives & imponderables which include, but are not limited to, bleeding, infection, perforation, drug reaction and potential missed lesion have been reviewed.  The potential for biopsy, lesion removal, esophageal dilation, etc. have also been discussed.  Questions have been answered.  All parties agreeable.  Please see history & physical in medical record for more information.  Medications:  Demerol 50 mg IV Versed 6 mg IV Cetacaine spray topically for oropharyngeal anesthesia  EGD  Description of procedure:  The endoscope was introduced through the mouth and advanced to the second portion of the duodenum without difficulty or limitations. The mucosal surfaces were surveyed very carefully during advancement of the scope and upon withdrawal.  Findings:  Esophagus:  Mucosa of approximately and middle third was normal. Prominent ring was noted about 2 cm proximal to GE junction. Esophagitis healed. GEJ:  42 cm Hiatus:  44 cm Stomach:  Stomach was empty and distended very well with insufflation. Folds in the proximal stomach were normal. Examination mucosa gastric body, antrum, pyloric channel, angularis fundus and cardia was normal. Duodenum:  Normal bulbar and post bulbar  mucosa.  Therapeutic/Diagnostic Maneuvers Performed:   Esophagus was dilated by passing 56 [place] Maloney dilator to full insertion. As the dilator was withdrawn endoscope was passed again and mucosal disruption noted to mucosa at cervical esophagus indicative of a disrupted web and ring was also noted to have been disrupted.  COLONOSCOPY Description of procedure:  After a digital rectal exam was performed, that colonoscope was advanced from the anus through the rectum and colon to the area of the cecum, ileocecal valve and appendiceal orifice. The cecum was deeply intubated. These structures were well-seen and photographed for the record. From the level of the cecum and ileocecal valve, the scope was slowly and cautiously withdrawn. The mucosal surfaces were carefully surveyed utilizing scope tip to flexion to facilitate fold flattening as needed. The scope was pulled down into the rectum where a thorough exam including retroflexion was performed.  Findings:   Prep satisfactory. Noncritical stricture noted at ileocolonic anastomosis without ulceration edema and erythema. Small bowel mucosa proximal to anastomosis was normal. Single small diverticulum distal to anastomosis. Mucosa of rest of the colon and rectum was normal. Unremarkable anorectal junction.  Therapeutic/Diagnostic Maneuvers Performed:  Biopsies were taken from anastomotic ulcer for routine histology.  Complications:  None  Cecal Withdrawal Time:  9 minutes  Impression:  EGD findings; Circumferential distal esophageal ring proximal to GE junction. It was disrupted by passing 43 [place] Maloney dilator which also resulted in small linear mucosal disruption at cervical esophagus indicative of disrupted web. Healed esophagitis. Small sliding hiatal hernia. Normal examination of stomach was a second part of duodenum.  Colonoscopy findings; Normal mucosa of distal small bowel. Noncritical  ileocolonic anastomotic stricture  with ulceration and inflammatory changes to mucosa. Biopsy taken. Single diverticulum distal to anastomosis. Mucosa of rest of the colon and rectum was normal.  Recommendations:  Standard instructions given. Resume aspirin on 05/25/2014. Continue omeprazole and Pentasa at usual dose. I will be contacting patient with biopsy results.  REHMAN,NAJEEB U  05/23/2014 12:45 PM  CC: Dr. Sallee Lange, MD & Dr. Rayne Du ref. provider found

## 2014-05-23 NOTE — Progress Notes (Addendum)
Pt status post right hemicolectomy.  Colon withdrawal time from anastamosis. 9 mins.

## 2014-05-23 NOTE — H&P (Signed)
Thomas David is an 79 y.o. male.   Chief Complaint: Patient's here for EGD, ED and colonoscopy. HPI: Patient is 62 old Caucasian male who has history of GERD complicated by distal esophageal stricture. He presented with foreign body esophagus in February 2016 underwent EGD with foreign body removal and esophageal dilation. Since then she's had single episode of dysphagia which cleared after hours. Heartburns well controlled with therapy. He has history of ileocolonic Crohn's disease. He has 3-4 bowel movements per day. Most of his stools are semi-formed to formed. He denies melena or rectal bleeding. His last colonoscopy was in July 2010 and mammogram had single polyp removed. Results not available at this time.  Past Medical History  Diagnosis Date  . GERD (gastroesophageal reflux disease)   . Blood transfusion, without reported diagnosis   . History of blood transfusion     ~ 40 years related to surgery  . Hypercholesteremia   . Arthritis     "back; feet; everywhere else" (05/04/2012)  . Chronic lower back pain   . Gout   . Prediabetes     pt does not monitor cbg at home  . Anemia   . History of kidney stones 40 years ago  . Foley catheter in place for last 6 weeks  . Crohn's disease   . Hypertension     Past Surgical History  Procedure Laterality Date  . Colonoscopy    . Upper gastrointestinal endoscopy    . Partial colectomy  ~ 40 years ago    some of large and small  . Tonsillectomy and adenoidectomy      as a child  . Hemorrhoid surgery  1959  . Kidney stone surgery  1970's  . Thyroidectomy Right 05/04/2012    Procedure: RIGHT HEMI THYROIDECTOMY;  Surgeon: Ascencion Dike, MD;  Location: [facility] OR;  Service: ENT;  Laterality: Right;  . Colectomy  1973    "part of large intestines" (05/04/2012)  . Transurethral prostatectomy with gyrus instruments N/A 07/11/2012    Procedure: TRANSURETHRAL PROSTATECTOMY WITH GYRUS INSTRUMENTS;  Surgeon: Claybon Jabs, MD;  Location: [facility] ORS;  Service:  Urology;  Laterality: N/A;  . Thyroidectomy    . Prostate surgery    . Colon resection    . Esophagogastroduodenoscopy N/A 03/16/2014    Procedure: esophageal dilation ;  Surgeon: Rogene Houston, MD;  Location: [facility] SU[facility];  Service: Endoscopy;  Laterality: N/A;    Family History  Problem Relation Age of Onset  . Diabetes Mother   . Stroke Father   . Heart disease Sister   . Healthy Daughter   . Healthy Son    Social History:  reports that he has never smoked. He does not have any smokeless tobacco history on file. He reports that he does not drink alcohol or use illicit drugs.  Allergies: No Active Allergies  Medications Prior to Admission  Medication Sig Dispense Refill  . allopurinol (ZYLOPRIM) 300 MG tablet Take 1 tablet (300 mg total) by mouth every morning. 90 tablet 1  . amLODipine (NORVASC) 5 MG tablet Take 1 tablet by mouth  every day 90 tablet 1  . aspirin EC 81 MG tablet Take 81 mg by mouth at bedtime.    . fluticasone (FLONASE) 50 MCG/ACT nasal spray Place 2 sprays into the nose at bedtime.     Marland Kitchen lisinopril (PRINIVIL,ZESTRIL) 2.5 MG tablet Take 2.5 mg by mouth daily.    . mesalamine (LIALDA) 1.2 G EC tablet Take 2 tablets (  2.4 g total) by mouth 2 (two) times daily. 120 tablet 11  . metoprolol (LOPRESSOR) 50 MG tablet Take 2 tablets by mouth at  bedtime 180 tablet 1  . omeprazole (PRILOSEC) 20 MG capsule Take 1 capsule by mouth  daily 90 capsule 1  . OVER THE COUNTER MEDICATION Take 30 mLs by mouth every evening. Xango Juice.    Marland Kitchen oxybutynin (DITROPAN-XL) 5 MG 24 hr tablet Take 1 tablet by mouth at  bedtime 90 tablet 1  . polyethylene glycol-electrolytes (NULYTELY/GOLYTELY) 420 G solution Take 4,000 mLs by mouth once. 4000 mL 0  . Probiotic Product ([place]) Take by mouth daily.    . simvastatin (ZOCOR) 20 MG tablet Take 1 tablet by mouth  every day 90 tablet 1  . vitamin B-12 (CYANOCOBALAMIN) 1000 MCG tablet Take 1,000 mcg by mouth 2 (two) times  daily.    Marland Kitchen losartan (COZAAR) 50 MG tablet Take 1 tablet (50 mg total) by mouth daily. (Patient not taking: Reported on 05/10/2014) 90 tablet 1    No results found for this or any previous visit (from the past 48 hour(s)). No results found.  ROS  Blood pressure 162/88, pulse 62, temperature 97.9 F (36.6 C), temperature source Oral, resp. rate 20, height 5' 9"  (1.753 m), weight 220 lb (99.791 kg), SpO2 94 %. Physical Exam  Constitutional: He appears well-developed and well-nourished.  HENT:  Mouth/Throat: Oropharynx is clear and moist.  Eyes: Conjunctivae are normal. No scleral icterus.  Neck: No thyromegaly present.  Cardiovascular: Normal rate, regular rhythm and normal heart sounds.   No murmur heard. Respiratory: Effort normal and breath sounds normal.  GI: Soft. He exhibits no distension and no mass. There is no tenderness.  Musculoskeletal: He exhibits no edema.  Lymphadenopathy:    He has no cervical adenopathy.  Neurological: He is alert.  Skin: Skin is warm and dry.     Assessment/Plan Solid food dysphagia. History of esophageal stricture secondary to GERD. History of Crohn's disease and colonic polyp. EGD with ED and colonoscopy.  Adithi Gammon U 05/23/2014, 11:49 AM

## 2014-05-25 ENCOUNTER — Encounter (HOSPITAL_COMMUNITY): Payer: Self-pay | Admitting: Internal Medicine

## 2014-05-28 NOTE — Progress Notes (Signed)
F/U has been noted on the recall list.

## 2014-08-31 ENCOUNTER — Ambulatory Visit: Payer: Medicare Other | Admitting: Family Medicine

## 2014-09-25 ENCOUNTER — Other Ambulatory Visit: Payer: Self-pay | Admitting: Family Medicine

## 2014-09-26 ENCOUNTER — Other Ambulatory Visit: Payer: Self-pay | Admitting: *Deleted

## 2014-09-26 ENCOUNTER — Ambulatory Visit: Payer: Medicare Other | Admitting: Family Medicine

## 2014-09-26 MED ORDER — FLUTICASONE PROPIONATE 50 MCG/ACT NA SUSP: 1.0000 | Freq: Every day | NASAL | Status: DC

## 2014-10-11 ENCOUNTER — Encounter: Payer: Self-pay | Admitting: Family Medicine

## 2014-10-11 ENCOUNTER — Ambulatory Visit (INDEPENDENT_AMBULATORY_CARE_PROVIDER_SITE_OTHER): Payer: Medicare Other | Admitting: Family Medicine

## 2014-10-11 VITALS — BP 132/82 | Ht 69.0 in | Wt 232.2 lb

## 2014-10-11 DIAGNOSIS — I10 Essential (primary) hypertension: Secondary | ICD-10-CM

## 2014-10-11 DIAGNOSIS — R7309 Other abnormal glucose: Secondary | ICD-10-CM

## 2014-10-11 DIAGNOSIS — M1000 Idiopathic gout, unspecified site: Secondary | ICD-10-CM | POA: Diagnosis not present

## 2014-10-11 DIAGNOSIS — R7303 Prediabetes: Secondary | ICD-10-CM

## 2014-10-11 DIAGNOSIS — G4733 Obstructive sleep apnea (adult) (pediatric): Secondary | ICD-10-CM | POA: Diagnosis not present

## 2014-10-11 DIAGNOSIS — E785 Hyperlipidemia, unspecified: Secondary | ICD-10-CM | POA: Diagnosis not present

## 2014-10-11 LAB — POCT GLYCOSYLATED HEMOGLOBIN (HGB A1C): Hemoglobin A1C: 5.4

## 2014-10-11 MED ORDER — LOSARTAN POTASSIUM 50 MG PO TABS: ORAL_TABLET | ORAL | Status: DC

## 2014-10-11 MED ORDER — FLUTICASONE PROPIONATE 50 MCG/ACT NA SUSP: 1.0000 | Freq: Every day | NASAL | Status: DC

## 2014-10-11 MED ORDER — OXYBUTYNIN CHLORIDE ER 5 MG PO TB24: ORAL_TABLET | ORAL | Status: DC

## 2014-10-11 MED ORDER — OMEPRAZOLE 20 MG PO CPDR: DELAYED_RELEASE_CAPSULE | ORAL | Status: DC

## 2014-10-11 MED ORDER — AMLODIPINE BESYLATE 5 MG PO TABS: ORAL_TABLET | ORAL | Status: DC

## 2014-10-11 MED ORDER — SIMVASTATIN 20 MG PO TABS: ORAL_TABLET | ORAL | Status: DC

## 2014-10-11 MED ORDER — ALLOPURINOL 300 MG PO TABS: ORAL_TABLET | ORAL | Status: DC

## 2014-10-11 MED ORDER — METOPROLOL TARTRATE 50 MG PO TABS: ORAL_TABLET | ORAL | Status: DC

## 2014-10-11 NOTE — Progress Notes (Addendum)
   Subjective:    Patient ID: Thomas David, male    DOB: [birth year], 79 y.o.   MRN: [id number]  Diabetes He presents for his follow-up diabetic visit. He has type 2 diabetes mellitus. Pertinent negatives for hypoglycemia include no confusion. Pertinent negatives for diabetes include no chest pain, no fatigue, no polydipsia, no polyphagia and no weakness. Risk factors for coronary artery disease include diabetes mellitus, dyslipidemia and hypertension. Current diabetic treatment includes diet. He is compliant with treatment all of the time.   Patient denies any low sugar spells He states not having any trouble reflux taking his omeprazole on a regular basis Patient denies any chest pressure tightness or pain States blood pressure under good control taken his medication Watches his diet takes his cholesterol medicine denies problems there Denies any gout flareups recently takes his medication tries watch his diet closely.   Review of Systems  Constitutional: Negative for activity change, appetite change and fatigue.  HENT: Negative for congestion.   Respiratory: Negative for cough.   Cardiovascular: Negative for chest pain.  Gastrointestinal: Negative for abdominal pain.  Endocrine: Negative for polydipsia and polyphagia.  Neurological: Negative for weakness.  Psychiatric/Behavioral: Negative for confusion.       Objective:   Physical Exam  Constitutional: He appears well-nourished. No distress.  Cardiovascular: Normal rate, regular rhythm and normal heart sounds.   No murmur heard. Pulmonary/Chest: Effort normal and breath sounds normal. No respiratory distress.  Musculoskeletal: He exhibits no edema.  Lymphadenopathy:    He has no cervical adenopathy.  Neurological: He is alert.  Psychiatric: His behavior is normal.  Vitals reviewed.         Assessment & Plan:  GERD-advise patient to cut back on omeprazole as long as is reflux symptoms don't flareup he will not needed.  Try going back to one every other day at this point all what in 6 months  1. Essential hypertension Blood pressure under good control continue current measures recommend metoprolol be twice daily not just once daily - Hepatic function panel - Basic metabolic panel  2. Hyperlipemia Cholesterol under decent control from last time months ago recheck lipid profile - Lipid panel  3. Prediabetes A1c looks great check lab work additional watch diet try to bring weight down at least 10 pounds - POCT glycosylated hemoglobin (Hb A1C) - Hepatic function panel - Basic metabolic panel - Microalbumin / creatinine urine ratio  4. Idiopathic gout, unspecified chronicity, unspecified site Gout control good continue current measures possibly be able to stop the medication if level very low - Uric acid  The patient is going to discuss with his insurance company if there is something cheaper than Ditropan XL  Also should be noted patient does have sleep apnea. He uses CPAP machine every night. Uses it greater than 4 hours per night with 100% compliance. This has eliminated snoring. Less since starting CPAP. Seems to be helping him. Continue CPAP.

## 2014-10-12 ENCOUNTER — Encounter: Payer: Self-pay | Admitting: Family Medicine

## 2014-10-12 LAB — LIPID PANEL
Chol/HDL Ratio: 3.2 ratio units (ref 0.0–5.0)
Cholesterol, Total: 136 mg/dL (ref 100–199)
HDL: 42 mg/dL (ref >39)
LDL Calculated: 45 mg/dL (ref 0–99)
Triglycerides: 247 mg/dL — ABNORMAL HIGH (ref 0–149)
VLDL Cholesterol Cal: 49 mg/dL — ABNORMAL HIGH (ref 5–40)

## 2014-10-12 LAB — HEPATIC FUNCTION PANEL
ALT: 23 IU/L (ref 0–44)
AST: 24 IU/L (ref 0–40)
Albumin: 4.2 g/dL (ref 3.5–4.8)
Alkaline Phosphatase: 82 IU/L (ref 39–117)
BILIRUBIN TOTAL: 0.5 mg/dL (ref 0.0–1.2)
BILIRUBIN, DIRECT: 0.13 mg/dL (ref 0.00–0.40)
TOTAL PROTEIN: 7.1 g/dL (ref 6.0–8.5)

## 2014-10-12 LAB — BASIC METABOLIC PANEL
BUN / CREAT RATIO: 15 (ref 10–22)
BUN: 12 mg/dL (ref 8–27)
CO2: 26 mmol/L (ref 18–29)
CREATININE: 0.78 mg/dL (ref 0.76–1.27)
Calcium: 9.6 mg/dL (ref 8.6–10.2)
Chloride: 97 mmol/L (ref 97–108)
GFR calc non Af Amer: 88 mL/min/{1.73_m2} (ref >59)
GFR, EST AFRICAN AMERICAN: 101 mL/min/{1.73_m2} (ref >59)
Glucose: 100 mg/dL — ABNORMAL HIGH (ref 65–99)
Potassium: 4.9 mmol/L (ref 3.5–5.2)
Sodium: 139 mmol/L (ref 134–144)

## 2014-10-12 LAB — MICROALBUMIN / CREATININE URINE RATIO
Creatinine, Urine: 102.9 mg/dL
MICROALB/CREAT RATIO: 328.1 mg/g creat — ABNORMAL HIGH (ref 0.0–30.0)
Microalbumin, Urine: 337.6 ug/mL

## 2014-10-12 LAB — URIC ACID: URIC ACID: 5 mg/dL (ref 3.7–8.6)

## 2014-11-28 ENCOUNTER — Ambulatory Visit (INDEPENDENT_AMBULATORY_CARE_PROVIDER_SITE_OTHER): Payer: Medicare Other | Admitting: *Deleted

## 2014-11-28 DIAGNOSIS — Z23 Encounter for immunization: Secondary | ICD-10-CM | POA: Diagnosis not present

## 2015-01-03 ENCOUNTER — Telehealth: Payer: Self-pay | Admitting: Family Medicine

## 2015-01-03 NOTE — Telephone Encounter (Signed)
Nurse's-please call patient. Let the patient know that [facility] is requesting additional information regarding his CPAP usage. Please verify how often the patient is using the CPAP machine. Also verify that he is using it more than 4 hours per night on the days he is using it. Also document is the usage of the CPAP machine helping him in regards to lessening daytime fatigue or other similar symptoms. If the patient can verify all of this over the phone him know that this would save him an office visit in December. Please get the information regarding this and forwarded back to me thank you

## 2015-01-04 NOTE — Telephone Encounter (Signed)
Pt uses cpap every night for the whole night. More than 4 hours. If he travels he takes with him. Never goes to sleep without it. He states he does not snore anymore since using. Fatigue is about the same as before using the cpap. Takes a 30 min nap during the day.

## 2015-01-21 NOTE — Telephone Encounter (Signed)
Please send the dictated notes from 10/11/2014 along with the request from [facility]

## 2015-01-31 ENCOUNTER — Telehealth (INDEPENDENT_AMBULATORY_CARE_PROVIDER_SITE_OTHER): Payer: Self-pay | Admitting: *Deleted

## 2015-01-31 NOTE — Telephone Encounter (Signed)
Patient needed to have his Pentasa called into [facility] for the 2017 year. This has been called to [facility]. Patient notified.

## 2015-02-19 ENCOUNTER — Encounter (INDEPENDENT_AMBULATORY_CARE_PROVIDER_SITE_OTHER): Payer: Self-pay | Admitting: Internal Medicine

## 2015-04-11 ENCOUNTER — Ambulatory Visit (INDEPENDENT_AMBULATORY_CARE_PROVIDER_SITE_OTHER): Payer: Medicare Other | Admitting: Family Medicine

## 2015-04-11 ENCOUNTER — Encounter: Payer: Self-pay | Admitting: Family Medicine

## 2015-04-11 VITALS — BP 132/84 | Ht 69.0 in | Wt 232.2 lb

## 2015-04-11 DIAGNOSIS — I10 Essential (primary) hypertension: Secondary | ICD-10-CM | POA: Diagnosis not present

## 2015-04-11 DIAGNOSIS — E785 Hyperlipidemia, unspecified: Secondary | ICD-10-CM

## 2015-04-11 NOTE — Progress Notes (Signed)
   Subjective:    Patient ID: Thomas David, male    DOB: 1935/04/23, 80 y.o.   MRN: [id number]  Hypertension This is a chronic problem. The current episode started more than 1 year ago. The problem has been gradually improving since onset. There are no associated agents to hypertension. There are no known risk factors for coronary artery disease. Treatments tried: amlodipine. The current treatment provides moderate improvement. There are no compliance problems.    Patient states that he has no concerns at this time.  Denies any chest tightness pressure pain shortness breath nausea vomiting diarrhea rectal bleeding or hematuria  Review of Systems     Objective:   Physical Exam   Lungs clear heart regular pulse normal BP good     Assessment & Plan:  Patient living in [place] might be getting a new doctor in the future he will let us know otherwise wellness in September  HTN stable continue current measures no lab work indicated on today's visit comprehensive lab work in August

## 2015-05-06 ENCOUNTER — Other Ambulatory Visit: Payer: Self-pay | Admitting: Family Medicine

## 2015-06-26 ENCOUNTER — Encounter: Payer: Self-pay | Admitting: Family Medicine

## 2015-06-26 ENCOUNTER — Encounter (INDEPENDENT_AMBULATORY_CARE_PROVIDER_SITE_OTHER): Payer: Self-pay

## 2015-06-26 ENCOUNTER — Ambulatory Visit (INDEPENDENT_AMBULATORY_CARE_PROVIDER_SITE_OTHER): Payer: Medicare Other | Admitting: Family Medicine

## 2015-06-26 VITALS — BP 142/74 | HR 73 | Temp 98.2°F | Ht 68.5 in | Wt 234.6 lb

## 2015-06-26 DIAGNOSIS — E785 Hyperlipidemia, unspecified: Secondary | ICD-10-CM | POA: Diagnosis not present

## 2015-06-26 DIAGNOSIS — K5080 Crohn's disease of both small and large intestine without complications: Secondary | ICD-10-CM

## 2015-06-26 DIAGNOSIS — G4733 Obstructive sleep apnea (adult) (pediatric): Secondary | ICD-10-CM

## 2015-06-26 DIAGNOSIS — I10 Essential (primary) hypertension: Secondary | ICD-10-CM | POA: Diagnosis not present

## 2015-06-26 DIAGNOSIS — N400 Enlarged prostate without lower urinary tract symptoms: Secondary | ICD-10-CM

## 2015-06-26 NOTE — Patient Instructions (Signed)
Continue your current medications.  Follow up in 6 months.  It was a pleasure to meet you.  Take care  Dr. Lacinda Axon   Health Maintenance, Male A healthy lifestyle and preventative care can promote health and wellness.  Maintain regular health, dental, and eye exams.  Eat a healthy diet. Foods like vegetables, fruits, whole grains, low-fat dairy products, and lean protein foods contain the nutrients you need and are low in calories. Decrease your intake of foods high in solid fats, added sugars, and salt. Get information about a proper diet from your health care provider, if necessary.  Regular physical exercise is one of the most important things you can do for your health. Most adults should get at least 150 minutes of moderate-intensity exercise (any activity that increases your heart rate and causes you to sweat) each week. In addition, most adults need muscle-strengthening exercises on 2 or more days a week.   Maintain a healthy weight. The body mass index (BMI) is a screening tool to identify possible weight problems. It provides an estimate of body fat based on height and weight. Your health care provider can find your BMI and can help you achieve or maintain a healthy weight. For males 20 years and older:  A BMI below 18.5 is considered underweight.  A BMI of 18.5 to 24.9 is normal.  A BMI of 25 to 29.9 is considered overweight.  A BMI of 30 and above is considered obese.  Maintain normal blood lipids and cholesterol by exercising and minimizing your intake of saturated fat. Eat a balanced diet with plenty of fruits and vegetables. Blood tests for lipids and cholesterol should begin at age 30 and be repeated every 5 years. If your lipid or cholesterol levels are high, you are over age 58, or you are at high risk for heart disease, you may need your cholesterol levels checked more frequently.Ongoing high lipid and cholesterol levels should be treated with medicines if diet and  exercise are not working.  If you smoke, find out from your health care provider how to quit. If you do not use tobacco, do not start.  Lung cancer screening is recommended for adults aged 37-80 years who are at high risk for developing lung cancer because of a history of smoking. A yearly low-dose CT scan of the lungs is recommended for people who have at least a 30-pack-year history of smoking and are current smokers or have quit within the past 15 years. A pack year of smoking is smoking an average of 1 pack of cigarettes a day for 1 year (for example, a 30-pack-year history of smoking could mean smoking 1 pack a day for 30 years or 2 packs a day for 15 years). Yearly screening should continue until the smoker has stopped smoking for at least 15 years. Yearly screening should be stopped for people who develop a health problem that would prevent them from having lung cancer treatment.  If you choose to drink alcohol, do not have more than 2 drinks per day. One drink is considered to be 12 oz (360 mL) of beer, 5 oz (150 mL) of wine, or 1.5 oz (45 mL) of liquor.  Avoid the use of street drugs. Do not share needles with anyone. Ask for help if you need support or instructions about stopping the use of drugs.  High blood pressure causes heart disease and increases the risk of stroke. High blood pressure is more likely to develop in:  People who have  blood pressure in the end of the normal range (100-139/85-89 mm Hg).  People who are overweight or obese.  People who are African American.  If you are 27-60 years of age, have your blood pressure checked every 3-5 years. If you are 25 years of age or older, have your blood pressure checked every year. You should have your blood pressure measured twice--once when you are at a hospital or clinic, and once when you are not at a hospital or clinic. Record the average of the two measurements. To check your blood pressure when you are not at a hospital or  clinic, you can use:  An automated blood pressure machine at a pharmacy.  A home blood pressure monitor.  If you are 32-67 years old, ask your health care provider if you should take aspirin to prevent heart disease.  Diabetes screening involves taking a blood sample to check your fasting blood sugar level. This should be done once every 3 years after age 15 if you are at a normal weight and without risk factors for diabetes. Testing should be considered at a younger age or be carried out more frequently if you are overweight and have at least 1 risk factor for diabetes.  Colorectal cancer can be detected and often prevented. Most routine colorectal cancer screening begins at the age of 64 and continues through age 74. However, your health care provider may recommend screening at an earlier age if you have risk factors for colon cancer. On a yearly basis, your health care provider may provide home test kits to check for hidden blood in the stool. A small camera at the end of a tube may be used to directly examine the colon (sigmoidoscopy or colonoscopy) to detect the earliest forms of colorectal cancer. Talk to your health care provider about this at age 48 when routine screening begins. A direct exam of the colon should be repeated every 5-10 years through age 20, unless early forms of precancerous polyps or small growths are found.  People who are at an increased risk for hepatitis B should be screened for this virus. You are considered at high risk for hepatitis B if:  You were born in a country where hepatitis B occurs often. Talk with your health care provider about which countries are considered high risk.  Your parents were born in a high-risk country and you have not received a shot to protect against hepatitis B (hepatitis B vaccine).  You have HIV or AIDS.  You use needles to inject street drugs.  You live with, or have sex with, someone who has hepatitis B.  You are a man who has  sex with other men (MSM).  You get hemodialysis treatment.  You take certain medicines for conditions like cancer, organ transplantation, and autoimmune conditions.  Hepatitis C blood testing is recommended for all people born from [birth year] through 1963 and any individual with known risk factors for hepatitis C.  Healthy men should no longer receive prostate-specific antigen (PSA) blood tests as part of routine cancer screening. Talk to your health care provider about prostate cancer screening.  Testicular cancer screening is not recommended for adolescents or adult males who have no symptoms. Screening includes self-exam, a health care provider exam, and other screening tests. Consult with your health care provider about any symptoms you have or any concerns you have about testicular cancer.  Practice safe sex. Use condoms and avoid high-risk sexual practices to reduce the spread of sexually transmitted infections (  STIs).  You should be screened for STIs, including gonorrhea and chlamydia if:  You are sexually active and are younger than 24 years.  You are older than 24 years, and your health care provider tells you that you are at risk for this type of infection.  Your sexual activity has changed since you were last screened, and you are at an increased risk for chlamydia or gonorrhea. Ask your health care provider if you are at risk.  If you are at risk of being infected with HIV, it is recommended that you take a prescription medicine daily to prevent HIV infection. This is called pre-exposure prophylaxis (PrEP). You are considered at risk if:  You are a man who has sex with other men (MSM).  You are a heterosexual man who is sexually active with multiple partners.  You take drugs by injection.  You are sexually active with a partner who has HIV.  Talk with your health care provider about whether you are at high risk of being infected with HIV. If you choose to begin PrEP, you should  first be tested for HIV. You should then be tested every 3 months for as long as you are taking PrEP.  Use sunscreen. Apply sunscreen liberally and repeatedly throughout the day. You should seek shade when your shadow is shorter than you. Protect yourself by wearing long sleeves, pants, a wide-brimmed hat, and sunglasses year round whenever you are outdoors.  Tell your health care provider of new moles or changes in moles, especially if there is a change in shape or color. Also, tell your health care provider if a mole is larger than the size of a pencil eraser.  A one-time screening for abdominal aortic aneurysm (AAA) and surgical repair of large AAAs by ultrasound is recommended for men aged 49-75 years who are current or former smokers.  Stay current with your vaccines (immunizations).   This information is not intended to replace advice given to you by your health care provider. Make sure you discuss any questions you have with your health care provider.   Document Released: 07/18/2007 Document Revised: 02/09/2014 Document Reviewed: 06/16/2010 Elsevier Interactive Patient Education [employer].

## 2015-06-26 NOTE — Assessment & Plan Note (Signed)
Stable. Endorses compliance with CPAP.

## 2015-06-26 NOTE — Assessment & Plan Note (Signed)
At goal.  Continue Zocor.

## 2015-06-26 NOTE — Assessment & Plan Note (Signed)
Stable. Continue Norvasc, Metoprolol, and Losartan. Discussed increasing Losartan and stopping Norvasc. Recommended increasing Losartan due to proteinuria. He wanted to wait until his next visit.

## 2015-06-26 NOTE — Progress Notes (Signed)
Subjective:  Patient ID: Thomas David, male    DOB: Jul 05, 1935  Age: 80 y.o. MRN: [id number]  CC: Establish care  HPI [name] is a 80 y.o. male presents to the clinic today to establish care. Issues are below.  HTN  Stable.   At goal on 3 meds: Amlodipine, metoprolol, losartan.  Of note, patient did have a significant amount of proteinuria at last check (2016; Microalbumin/creatinine ratio 328.1).  HLD  Well controlled on Zocor.  Crohn's disease  Stable on Mesalamine.  No recent flares.  Followed by GI.  OSA  Stable. Compliant with CPAP.  BPH  Stable on Oxybutnin.  No concerns at this time.  PMH, Surgical Hx, Family Hx, Social History reviewed and updated as below.  Past Medical History  Diagnosis Date  . GERD (gastroesophageal reflux disease)   . Hypercholesteremia   . Arthritis   . Chronic lower back pain   . Gout   . Prediabetes   . History of kidney stones 40 years ago  . Crohn's disease ([place])   . Hypertension    Past Surgical History  Procedure Laterality Date  . Colonoscopy    . Upper gastrointestinal endoscopy    . Partial colectomy  ~ 40 years ago    some of large and small  . Tonsillectomy and adenoidectomy      as a child  . Hemorrhoid surgery  1959  . Kidney stone surgery  1970's  . Thyroidectomy Right 05/04/2012    Procedure: RIGHT HEMI THYROIDECTOMY;  Surgeon: Ascencion Dike, MD;  Location: [facility] OR;  Service: ENT;  Laterality: Right;  . Colectomy  1973    "part of large intestines" (05/04/2012)  . Transurethral prostatectomy with gyrus instruments N/A 07/11/2012    Procedure: TRANSURETHRAL PROSTATECTOMY WITH GYRUS INSTRUMENTS;  Surgeon: Claybon Jabs, MD;  Location: [facility] ORS;  Service: Urology;  Laterality: N/A;  . Thyroidectomy    . Prostate surgery    . Colon resection    . Esophagogastroduodenoscopy N/A 03/16/2014    Procedure: esophageal dilation ;  Surgeon: Rogene Houston, MD;  Location: [facility];  Service: Endoscopy;   Laterality: N/A;  . Colonoscopy N/A 05/23/2014    Procedure: COLONOSCOPY;  Surgeon: Rogene Houston, MD;  Location: [facility];  Service: Endoscopy;  Laterality: N/A;  1200  . Esophagogastroduodenoscopy N/A 05/23/2014    Procedure: ESOPHAGOGASTRODUODENOSCOPY (EGD);  Surgeon: Rogene Houston, MD;  Location: [facility];  Service: Endoscopy;  Laterality: N/A;  . Esophageal dilation N/A 05/23/2014    Procedure: ESOPHAGEAL DILATION;  Surgeon: Rogene Houston, MD;  Location: [facility];  Service: Endoscopy;  Laterality: N/A;   Family History  Problem Relation Age of Onset  . Diabetes Mother   . Stroke Father   . Heart disease Sister   . Healthy Daughter   . Healthy Son    Social History  Substance Use Topics  . Smoking status: Never Smoker   . Smokeless tobacco: Not on file  . Alcohol Use: No   Review of Systems General: Denies unexplained weight loss, fever. Skin: Denies new or changing mole, sore/wound that won't heal. ENT: Trouble hearing, ringing in the ears, sores in the mouth, hoarseness, trouble swallowing. Eyes: Denies trouble seeing/visual disturbance. Heart/CV: Denies chest pain, shortness of breath, edema, palpitations. Lungs/Resp: Denies cough, shortness of breath, hemoptysis. Abd/GI: Denies nausea, vomiting, diarrhea, constipation, abdominal pain, hematochezia, melena. GU: Denies dysuria, incontinence, hematuria, urinary frequency, difficulty starting/keeping stream, penile discharge,  sexual difficulty, lump in testicles. MSK: Denies joint pain/swelling, myalgias. Neuro: Denies headaches, weakness, numbness, dizziness, syncope. Psych: Denies sadness, anxiety, stress, memory difficulty. Endocrine: Denies polyuria and polydipsia.  Objective:   Today's Vitals: BP 142/74 mmHg  Pulse 73  Temp(Src) 98.2 F (36.8 C) (Oral)  Ht 5' 8.5" (1.74 m)  Wt 234 lb 9.6 oz (106.414 kg)  BMI 35.15 kg/m2  SpO2 95%  Physical Exam  Constitutional: He is oriented to person,  place, and time.  Obese male in NAD.  HENT:  Head: Normocephalic and atraumatic.  Mouth/Throat: Oropharynx is clear and moist.  Eyes: Conjunctivae are normal. No scleral icterus.  Neck: Neck supple.  Cardiovascular: Normal rate and regular rhythm.   Pulmonary/Chest: Effort normal and breath sounds normal. He has no wheezes. He has no rales.  Abdominal: Soft. He exhibits no distension. There is no tenderness. There is no rebound and no guarding.  Diastasis noted.  Musculoskeletal: Normal range of motion. He exhibits no edema.  Lymphadenopathy:    He has no cervical adenopathy.  Neurological: He is alert and oriented to person, place, and time.  Skin: Skin is warm and dry. No rash noted.  Psychiatric: He has a normal mood and affect.  Vitals reviewed.  Assessment & Plan:   Problem List Items Addressed This Visit    Obstructive sleep apnea    Stable. Endorses compliance with CPAP.      Hyperlipemia    At goal.  Continue Zocor.      HTN (hypertension) - Primary    Stable. Continue Norvasc, Metoprolol, and Losartan. Discussed increasing Losartan and stopping Norvasc. Recommended increasing Losartan due to proteinuria. He wanted to wait until his next visit.      Crohn's disease of small and large intestines (HCC)    Stable on mesalamine. Patient to continue. Follows closely with GI.      BPH (benign prostatic hyperplasia)    Stable. Doing well on oxybutnin. No other meds.         Outpatient Encounter Prescriptions as of 06/26/2015  Medication Sig  . allopurinol (ZYLOPRIM) 300 MG tablet Take 1 tablet by mouth  every morning  . amLODipine (NORVASC) 5 MG tablet Take 1 tablet by mouth  every day  . aspirin EC 81 MG tablet Take 81 mg by mouth at bedtime.  . fluticasone (FLONASE) 50 MCG/ACT nasal spray Use 1 spray into both  nostrils daily.  Marland Kitchen losartan (COZAAR) 50 MG tablet Take 1 tablet by mouth  daily  . mesalamine (PENTASA) 500 MG CR capsule Take 500 mg by mouth 2  (two) times daily.  . metoprolol (LOPRESSOR) 50 MG tablet Take 1 tablet by mouth two  times daily  . omeprazole (PRILOSEC) 20 MG capsule Take 1 capsule by mouth  daily  . OVER THE COUNTER MEDICATION Take 30 mLs by mouth every evening. Xango Juice.  Marland Kitchen oxybutynin (DITROPAN-XL) 5 MG 24 hr tablet Take 1 tablet by mouth at  bedtime  . Probiotic Product ([place]) Take by mouth daily.  . simvastatin (ZOCOR) 20 MG tablet Take 1 tablet by mouth  every day  . vitamin B-12 (CYANOCOBALAMIN) 1000 MCG tablet Take 1,000 mcg by mouth 2 (two) times daily.  . [DISCONTINUED] mesalamine (LIALDA) 1.2 G EC tablet Take 2 tablets (2.4 g total) by mouth 2 (two) times daily.   No facility-administered encounter medications on file as of 06/26/2015.    Follow-up: 6 months  [place] DO [facility]

## 2015-06-26 NOTE — Assessment & Plan Note (Signed)
Stable. Doing well on oxybutnin. No other meds.

## 2015-06-26 NOTE — Progress Notes (Signed)
Pre visit review using our clinic review tool, if applicable. No additional management support is needed unless otherwise documented below in the visit note. 

## 2015-06-26 NOTE — Assessment & Plan Note (Signed)
Stable on mesalamine. Patient to continue. Follows closely with GI.

## 2015-07-02 ENCOUNTER — Encounter (INDEPENDENT_AMBULATORY_CARE_PROVIDER_SITE_OTHER): Payer: Self-pay | Admitting: Internal Medicine

## 2015-07-02 ENCOUNTER — Ambulatory Visit (INDEPENDENT_AMBULATORY_CARE_PROVIDER_SITE_OTHER): Payer: Medicare Other | Admitting: Internal Medicine

## 2015-07-02 VITALS — BP 140/84 | HR 76 | Temp 98.4°F | Resp 18 | Ht 69.0 in | Wt 233.5 lb

## 2015-07-02 DIAGNOSIS — K219 Gastro-esophageal reflux disease without esophagitis: Secondary | ICD-10-CM

## 2015-07-02 DIAGNOSIS — K5080 Crohn's disease of both small and large intestine without complications: Secondary | ICD-10-CM | POA: Diagnosis not present

## 2015-07-02 NOTE — Progress Notes (Signed)
Presenting complaint;  Follow-up for Crohn's disease and GERD.  Subjective:  Patient is 80 year old Caucasian male who is here for scheduled visit. He was last seen in the office November 2015 but he underwent EGD and ED and colonoscopy in April 2016. He and his wife are not living in retirement community in [place]. He is quite pleased with his new arrangement. However he is still trying to sell his house and [facility]. He rarely experiences heartburn. He denies dysphagia. His bowels move 2-3 times a day. Most of his stools are formed. He denies abdominal pain melena or rectal bleeding. He has been going to the gym at least twice a week but lately he slacked off. He will resume soon. He states his granddaughter who has IBD is doing well with therapy. She lives in [place].   Current Medications: Outpatient Encounter Prescriptions as of 07/02/2015  Medication Sig  . allopurinol (ZYLOPRIM) 300 MG tablet Take 1 tablet by mouth  every morning  . amLODipine (NORVASC) 5 MG tablet Take 1 tablet by mouth  every day  . aspirin EC 81 MG tablet Take 81 mg by mouth at bedtime.  . fluticasone (FLONASE) 50 MCG/ACT nasal spray Use 1 spray into both  nostrils daily.  Marland Kitchen losartan (COZAAR) 50 MG tablet Take 1 tablet by mouth  daily  . mesalamine (PENTASA) 500 MG CR capsule Take 500 mg by mouth 2 (two) times daily.  . metoprolol (LOPRESSOR) 50 MG tablet Take 1 tablet by mouth two  times daily  . omeprazole (PRILOSEC) 20 MG capsule Take 1 capsule by mouth  daily  . OVER THE COUNTER MEDICATION Take 30 mLs by mouth every evening. Xango Juice.  Marland Kitchen oxybutynin (DITROPAN-XL) 5 MG 24 hr tablet Take 1 tablet by mouth at  bedtime  . Probiotic Product ([place]) Take by mouth daily.  . simvastatin (ZOCOR) 20 MG tablet Take 1 tablet by mouth  every day  . vitamin B-12 (CYANOCOBALAMIN) 1000 MCG tablet Take 1,000 mcg by mouth 2 (two) times daily.   No facility-administered  encounter medications on file as of 07/02/2015.     Objective: Blood pressure 140/84, pulse 76, temperature 98.4 F (36.9 C), temperature source Oral, resp. rate 18, height 5' 9"  (1.753 m), weight 233 lb 8 oz (105.915 kg). Patient is alert and in no acute distress. Conjunctiva is pink. Sclera is nonicteric Oropharyngeal mucosa is normal. No neck masses or thyromegaly noted. Cardiac exam with regular rhythm normal S1 and S2. No murmur or gallop noted. Lungs are clear to auscultation. Abdomen is full but soft and nontender without organomegaly or masses. No LE edema or clubbing noted.  Labs/studies Results: No labs on file since 10/11/2014.    Assessment:  #1. Ileo-colonic Crohn's disease. Last colonoscopy was in April 2016 revealing noncritical ileocolonic anastomotic stricture with ulceration and inflammatory changes. Clinically he is in remission. He should continue Pentasa because his bioavailable and small bowel which is not the case with other mesalamine switch her bioavailable in the colon. #2. GERD complicated by distal esophageal stricture. Last EGD with dilation and foreign body removal was 05/13/2014. Heartburn is well controlled with PPI which she should continue indefinitely because benefit outweighs the risk.   Plan:  Continue in [place] at 1 g by mouth twice a day. Continue omeprazole at 20 mg by mouth every morning. Patient will call if he has dysphagia diarrhea or rectal bleeding. Office visit in one year. Patient was given samples of Pentasa.

## 2015-07-02 NOTE — Patient Instructions (Signed)
Call if you have change in bowel habits or stool frequency or abdominal pain.

## 2015-10-16 ENCOUNTER — Ambulatory Visit: Payer: Medicare Other | Admitting: Family Medicine

## 2015-11-06 ENCOUNTER — Other Ambulatory Visit: Payer: Self-pay | Admitting: Family Medicine

## 2015-11-18 ENCOUNTER — Ambulatory Visit (INDEPENDENT_AMBULATORY_CARE_PROVIDER_SITE_OTHER): Payer: Medicare Other

## 2015-11-18 DIAGNOSIS — Z23 Encounter for immunization: Secondary | ICD-10-CM | POA: Diagnosis not present

## 2015-11-18 NOTE — Progress Notes (Signed)
Received flu shot

## 2015-11-20 ENCOUNTER — Ambulatory Visit: Payer: Self-pay

## 2016-01-06 ENCOUNTER — Encounter: Payer: Self-pay | Admitting: Family Medicine

## 2016-01-06 ENCOUNTER — Ambulatory Visit (INDEPENDENT_AMBULATORY_CARE_PROVIDER_SITE_OTHER): Payer: Medicare Other | Admitting: Family Medicine

## 2016-01-06 VITALS — BP 156/81 | HR 64 | Temp 97.8°F | Resp 14 | Wt 235.1 lb

## 2016-01-06 DIAGNOSIS — E785 Hyperlipidemia, unspecified: Secondary | ICD-10-CM

## 2016-01-06 DIAGNOSIS — D510 Vitamin B12 deficiency anemia due to intrinsic factor deficiency: Secondary | ICD-10-CM | POA: Diagnosis not present

## 2016-01-06 DIAGNOSIS — R809 Proteinuria, unspecified: Secondary | ICD-10-CM | POA: Diagnosis not present

## 2016-01-06 DIAGNOSIS — R7303 Prediabetes: Secondary | ICD-10-CM

## 2016-01-06 DIAGNOSIS — M1A9XX0 Chronic gout, unspecified, without tophus (tophi): Secondary | ICD-10-CM | POA: Diagnosis not present

## 2016-01-06 DIAGNOSIS — I10 Essential (primary) hypertension: Secondary | ICD-10-CM | POA: Diagnosis not present

## 2016-01-06 DIAGNOSIS — K5080 Crohn's disease of both small and large intestine without complications: Secondary | ICD-10-CM

## 2016-01-06 DIAGNOSIS — K219 Gastro-esophageal reflux disease without esophagitis: Secondary | ICD-10-CM

## 2016-01-06 LAB — CBC
HCT: 41.8 % (ref 39.0–52.0)
HEMOGLOBIN: 14.1 g/dL (ref 13.0–17.0)
MCHC: 33.7 g/dL (ref 30.0–36.0)
MCV: 88.8 fl (ref 78.0–100.0)
PLATELETS: 210 10*3/uL (ref 150.0–400.0)
RBC: 4.71 Mil/uL (ref 4.22–5.81)
RDW: 14.7 % (ref 11.5–15.5)
WBC: 7.9 10*3/uL (ref 4.0–10.5)

## 2016-01-06 LAB — MICROALBUMIN / CREATININE URINE RATIO
Creatinine,U: 238.4 mg/dL
MICROALB UR: 48 mg/dL — AB (ref 0.0–1.9)
MICROALB/CREAT RATIO: 20.1 mg/g (ref 0.0–30.0)

## 2016-01-06 LAB — COMPREHENSIVE METABOLIC PANEL
ALK PHOS: 84 U/L (ref 39–117)
ALT: 21 U/L (ref 0–53)
AST: 25 U/L (ref 0–37)
Albumin: 4.2 g/dL (ref 3.5–5.2)
BILIRUBIN TOTAL: 0.5 mg/dL (ref 0.2–1.2)
BUN: 15 mg/dL (ref 6–23)
CALCIUM: 9.6 mg/dL (ref 8.4–10.5)
CO2: 28 meq/L (ref 19–32)
CREATININE: 0.93 mg/dL (ref 0.40–1.50)
Chloride: 105 mEq/L (ref 96–112)
GFR: 83.47 mL/min (ref >60.00)
GLUCOSE: 118 mg/dL — AB (ref 70–99)
Potassium: 4.9 mEq/L (ref 3.5–5.1)
Sodium: 141 mEq/L (ref 135–145)
TOTAL PROTEIN: 7.5 g/dL (ref 6.0–8.3)

## 2016-01-06 LAB — LIPID PANEL
Cholesterol: 135 mg/dL (ref 0–200)
HDL: 37.9 mg/dL — AB (ref >39.00)
NONHDL: 97.52
TRIGLYCERIDES: 271 mg/dL — AB (ref 0.0–149.0)
Total CHOL/HDL Ratio: 4
VLDL: 54.2 mg/dL — ABNORMAL HIGH (ref 0.0–40.0)

## 2016-01-06 LAB — URINALYSIS, MICROSCOPIC ONLY: RBC / HPF: NONE SEEN (ref 0–?)

## 2016-01-06 LAB — URIC ACID: URIC ACID, SERUM: 5.6 mg/dL (ref 4.0–7.8)

## 2016-01-06 LAB — LDL CHOLESTEROL, DIRECT: Direct LDL: 53 mg/dL

## 2016-01-06 LAB — HEMOGLOBIN A1C: HEMOGLOBIN A1C: 6.3 % (ref 4.6–6.5)

## 2016-01-06 MED ORDER — OMEPRAZOLE 20 MG PO CPDR: 20.0000 mg | DELAYED_RELEASE_CAPSULE | Freq: Every day | ORAL | 1 refills | Status: DC

## 2016-01-06 MED ORDER — SIMVASTATIN 20 MG PO TABS: 20.0000 mg | ORAL_TABLET | Freq: Every day | ORAL | 1 refills | Status: DC

## 2016-01-06 MED ORDER — ALLOPURINOL 300 MG PO TABS: 300.0000 mg | ORAL_TABLET | Freq: Every morning | ORAL | 1 refills | Status: DC

## 2016-01-06 MED ORDER — LOSARTAN POTASSIUM 100 MG PO TABS: 100.0000 mg | ORAL_TABLET | Freq: Every day | ORAL | 1 refills | Status: DC

## 2016-01-06 MED ORDER — OXYBUTYNIN CHLORIDE ER 5 MG PO TB24: 5.0000 mg | ORAL_TABLET | Freq: Every day | ORAL | 1 refills | Status: DC

## 2016-01-06 NOTE — Assessment & Plan Note (Signed)
Stable on omeprazole. Refill today.

## 2016-01-06 NOTE — Assessment & Plan Note (Signed)
Uric acid today. Allopurinol refilled.

## 2016-01-06 NOTE — Assessment & Plan Note (Signed)
At goal. Continue Simvastatin.  Refilled today.

## 2016-01-06 NOTE — Patient Instructions (Signed)
Repeat lab in 7-10 days. BP check then as well.   Follow up in 3-6 months.  Take care  Dr. Lacinda Axon

## 2016-01-06 NOTE — Assessment & Plan Note (Signed)
New problem. Suspect that this is secondary to hypertension but is a significant amount of proteinuria. Increasing losartan. Urine microalbumin/creatinine today as well as microscopy.

## 2016-01-06 NOTE — Progress Notes (Signed)
Pre visit review using our clinic review tool, if applicable. No additional management support is needed unless otherwise documented below in the visit note. 

## 2016-01-06 NOTE — Assessment & Plan Note (Signed)
Stable. Continue Mesalamine.

## 2016-01-06 NOTE — Assessment & Plan Note (Signed)
Not at goal today.  Increasing Losartan (especially in setting of proteinuria). Stopping Norvasc.

## 2016-01-06 NOTE — Progress Notes (Signed)
Subjective:  Patient ID: Thomas David, male    DOB: 12/01/1935  Age: 80 y.o. MRN: [id number]  CC: Follow up  HPI:  80 year old male with Hypertension, Hyperlipidemia, Prediabetes, Crohn's disease, BPH, Gout presents for follow-up.  HTN, Proteinuria  BP elevated today. Has not taken medications this am.  Currently compliant with metoprolol, losartan, Norvasc.  Review of his labs revealed significant proteinuria. Will discuss this today.  HLD  At goal on simvastatin. Tolerating without difficulty. Needs refill.  Crohn's  Stable on Mesalamine. Followed by GI.  GERD  Stable on Omeprazole. Needs refill.   Gout  Well controlled.  Needs labs and refill on allopurinol today.  Social Hx   Social History   Social History  . Marital status: Married    Spouse name: N/A  . Number of children: N/A  . Years of education: N/A   Social History Main Topics  . Smoking status: Never Smoker  . Smokeless tobacco: Never Used  . Alcohol use No  . Drug use: No  . Sexual activity: No   Other Topics Concern  . None   Social History Narrative   ** Merged History Encounter **       Review of Systems  Constitutional: Negative.   Musculoskeletal: Positive for arthralgias.   Objective:  BP (!) 156/81 (BP Location: Left Arm, Patient Position: Sitting, Cuff Size: Large)   Pulse 64   Temp 97.8 F (36.6 C) (Oral)   Resp 14   Wt 235 lb 2 oz (106.7 kg)   SpO2 98%   BMI 34.72 kg/m   BP/Weight 01/06/2016 07/02/2015 9/38/1829  Systolic BP 937 169 678  Diastolic BP 81 84 74  Wt. (Lbs) 235.13 233.5 234.6  BMI 34.72 34.47 35.15   Physical Exam  Constitutional: He is oriented to person, place, and time. He appears well-developed. No distress.  Cardiovascular: Normal rate and regular rhythm.   Trace LE edema.  Pulmonary/Chest: Effort normal and breath sounds normal.  Abdominal: Soft. He exhibits no distension. There is no tenderness.  Neurological: He is alert and oriented to  person, place, and time.  Psychiatric: He has a normal mood and affect.  Vitals reviewed.  Lab Results  Component Value Date   WBC 7.6 03/16/2014   HGB 13.1 03/16/2014   HCT 39.6 03/16/2014   PLT 196 03/16/2014   GLUCOSE 100 (H) 10/11/2014   CHOL 136 10/11/2014   TRIG 247 (H) 10/11/2014   HDL 42 10/11/2014   LDLCALC 45 10/11/2014   ALT 23 10/11/2014   AST 24 10/11/2014   NA 139 10/11/2014   K 4.9 10/11/2014   CL 97 10/11/2014   CREATININE 0.78 10/11/2014   BUN 12 10/11/2014   CO2 26 10/11/2014   TSH 4.280 02/13/2013   HGBA1C 5.4 10/11/2014   MICROALBUR 26.60 (H) 08/15/2013    Assessment & Plan:   Problem List Items Addressed This Visit    Proteinuria    New problem. Suspect that this is secondary to hypertension but is a significant amount of proteinuria. Increasing losartan. Urine microalbumin/creatinine today as well as microscopy.      Relevant Orders   Urine Microscopic Only   Prediabetes   Relevant Orders   Hemoglobin A1c   Pernicious anemia   Relevant Orders   CBC   Hyperlipemia    At goal. Continue Simvastatin.  Refilled today.      Relevant Medications   simvastatin (ZOCOR) 20 MG tablet   losartan (COZAAR) 100 MG  tablet   Other Relevant Orders   Lipid panel   Gout    Uric acid today. Allopurinol refilled.       Relevant Orders   Uric acid   GERD (gastroesophageal reflux disease)    Stable on omeprazole. Refill today.      Relevant Medications   omeprazole (PRILOSEC) 20 MG capsule   Essential hypertension - Primary    Not at goal today.  Increasing Losartan (especially in setting of proteinuria). Stopping Norvasc.      Relevant Medications   simvastatin (ZOCOR) 20 MG tablet   losartan (COZAAR) 100 MG tablet   Other Relevant Orders   Comprehensive metabolic panel   Microalbumin / creatinine urine ratio   Crohn's disease of small and large intestines (HCC)    Stable. Continue Mesalamine.         Meds ordered this encounter    Medications  . omeprazole (PRILOSEC) 20 MG capsule    Sig: Take 1 capsule (20 mg total) by mouth daily.    Dispense:  90 capsule    Refill:  1    Per pt please do not send until he request medication.  . simvastatin (ZOCOR) 20 MG tablet    Sig: Take 1 tablet (20 mg total) by mouth daily.    Dispense:  90 tablet    Refill:  1    Per pt please do not send until he request medication.  Marland Kitchen allopurinol (ZYLOPRIM) 300 MG tablet    Sig: Take 1 tablet (300 mg total) by mouth every morning.    Dispense:  90 tablet    Refill:  1    Per pt please do not send until he request medication.  Marland Kitchen oxybutynin (DITROPAN-XL) 5 MG 24 hr tablet    Sig: Take 1 tablet (5 mg total) by mouth at bedtime.    Dispense:  90 tablet    Refill:  1    Generic only.  Marland Kitchen losartan (COZAAR) 100 MG tablet    Sig: Take 1 tablet (100 mg total) by mouth daily.    Dispense:  90 tablet    Refill:  1    Follow-up: 7-10 days for lab and repeat BP  [place]

## 2016-01-15 ENCOUNTER — Other Ambulatory Visit: Payer: Self-pay | Admitting: Family Medicine

## 2016-01-15 ENCOUNTER — Telehealth: Payer: Self-pay

## 2016-01-15 DIAGNOSIS — I10 Essential (primary) hypertension: Secondary | ICD-10-CM

## 2016-01-15 NOTE — Telephone Encounter (Signed)
Pt coming for repeat labs 01/16/16. I'm guessing it is either a cmp or bmp because we increased losartan at last OV. Please place future order. Thank you.

## 2016-01-16 ENCOUNTER — Other Ambulatory Visit (INDEPENDENT_AMBULATORY_CARE_PROVIDER_SITE_OTHER): Payer: Medicare Other

## 2016-01-16 ENCOUNTER — Ambulatory Visit (INDEPENDENT_AMBULATORY_CARE_PROVIDER_SITE_OTHER): Payer: Medicare Other

## 2016-01-16 VITALS — BP 150/78 | HR 64 | Resp 16

## 2016-01-16 DIAGNOSIS — I10 Essential (primary) hypertension: Secondary | ICD-10-CM | POA: Diagnosis not present

## 2016-01-16 LAB — BASIC METABOLIC PANEL
BUN: 12 mg/dL (ref 6–23)
CHLORIDE: 105 meq/L (ref 96–112)
CO2: 29 meq/L (ref 19–32)
CREATININE: 0.8 mg/dL (ref 0.40–1.50)
Calcium: 9.4 mg/dL (ref 8.4–10.5)
GFR: 99.31 mL/min (ref >60.00)
Glucose, Bld: 114 mg/dL — ABNORMAL HIGH (ref 70–99)
Potassium: 4 mEq/L (ref 3.5–5.1)
Sodium: 141 mEq/L (ref 135–145)

## 2016-01-16 NOTE — Progress Notes (Signed)
Patient comes in for blood pressure check since increasing to Losartan 100 mg and discontinuing Norvasc. Patient reports compliance with blood pressure medications.   Please advise.

## 2016-01-16 NOTE — Progress Notes (Signed)
Have him follow up with me. May need to add back Norvasc.

## 2016-01-16 NOTE — Progress Notes (Signed)
Patient advised of below and verbalized and understanding.  Appointment scheduled.

## 2016-01-31 ENCOUNTER — Ambulatory Visit: Payer: Self-pay | Admitting: Family Medicine

## 2016-02-05 ENCOUNTER — Encounter: Payer: Self-pay | Admitting: Family Medicine

## 2016-02-05 ENCOUNTER — Ambulatory Visit (INDEPENDENT_AMBULATORY_CARE_PROVIDER_SITE_OTHER): Payer: Medicare Other | Admitting: Family Medicine

## 2016-02-05 DIAGNOSIS — I10 Essential (primary) hypertension: Secondary | ICD-10-CM

## 2016-02-05 MED ORDER — AMLODIPINE BESYLATE 5 MG PO TABS: 5.0000 mg | ORAL_TABLET | Freq: Every day | ORAL | 3 refills | Status: DC

## 2016-02-05 NOTE — Progress Notes (Signed)
   Subjective:  Patient ID: Thomas David, male    DOB: 05-Dec-1935  Age: 81 y.o. MRN: [id number]  CC: BP follow up  HPI:  81 year old male with hypertension, hyperlipidemia, GERD, gout, Crohn's presents for follow-up regarding his hypertension.  HTN  BP elevated today (worsening).  Patient states that it was approximately 542 systolic this morning.  He is currently taking metoprolol and losartan.  I previously increased the dose of losartan and discontinued amlodipine.  He's tolerating the medication without difficulty.  No other associated symptoms.  No other complaints at this time.  Social Hx   Social History   Social History  . Marital status: Married    Spouse name: N/A  . Number of children: N/A  . Years of education: N/A   Social History Main Topics  . Smoking status: Never Smoker  . Smokeless tobacco: Never Used  . Alcohol use No  . Drug use: No  . Sexual activity: No   Other Topics Concern  . None   Social History Narrative   ** Merged History Encounter **       Review of Systems  Constitutional: Negative.   Respiratory: Negative.   Cardiovascular: Negative.    Objective:  BP (!) 162/80   Pulse 79   Temp 98.1 F (36.7 C) (Oral)   Resp 14   Wt 236 lb 9.6 oz (107.3 kg)   SpO2 94%   BMI 34.94 kg/m   BP/Weight 02/05/2016 01/16/2016 70/07/2374  Systolic BP 283 151 761  Diastolic BP 80 78 81  Wt. (Lbs) 236.6 - 235.13  BMI 34.94 - 34.72   Physical Exam  Constitutional: He is oriented to person, place, and time. He appears well-developed. No distress.  Cardiovascular: Normal rate and regular rhythm.   Pulmonary/Chest: Effort normal.  Neurological: He is alert and oriented to person, place, and time.  Psychiatric: He has a normal mood and affect.  Vitals reviewed.  Lab Results  Component Value Date   WBC 7.9 01/06/2016   HGB 14.1 01/06/2016   HCT 41.8 01/06/2016   PLT 210.0 01/06/2016   GLUCOSE 114 (H) 01/16/2016   CHOL 135 01/06/2016     TRIG 271.0 (H) 01/06/2016   HDL 37.90 (L) 01/06/2016   LDLDIRECT 53.0 01/06/2016   LDLCALC 45 10/11/2014   ALT 21 01/06/2016   AST 25 01/06/2016   NA 141 01/16/2016   K 4.0 01/16/2016   CL 105 01/16/2016   CREATININE 0.80 01/16/2016   BUN 12 01/16/2016   CO2 29 01/16/2016   TSH 4.280 02/13/2013   HGBA1C 6.3 01/06/2016   MICROALBUR 48.0 (H) 01/06/2016   Assessment & Plan:   Problem List Items Addressed This Visit    Essential hypertension    Established problem, worsening. Adding back norvasc. Continue Losartan and Metoprolol. Follow up in 1 month.      Relevant Medications   amLODipine (NORVASC) 5 MG tablet     Follow-up: 1 month  [place] DO [facility]

## 2016-02-05 NOTE — Assessment & Plan Note (Signed)
Established problem, worsening. Adding back norvasc. Continue Losartan and Metoprolol. Follow up in 1 month.

## 2016-02-05 NOTE — Progress Notes (Signed)
Pre visit review using our clinic review tool, if applicable. No additional management support is needed unless otherwise documented below in the visit note. 

## 2016-02-05 NOTE — Patient Instructions (Signed)
Restart the norvasc.  Follow up in 1 month.  Take care  Dr. Lacinda Axon

## 2016-03-09 ENCOUNTER — Ambulatory Visit (INDEPENDENT_AMBULATORY_CARE_PROVIDER_SITE_OTHER): Payer: Medicare Other | Admitting: Family Medicine

## 2016-03-09 ENCOUNTER — Encounter: Payer: Self-pay | Admitting: Family Medicine

## 2016-03-09 VITALS — BP 158/84 | HR 71 | Temp 98.3°F | Wt 236.4 lb

## 2016-03-09 DIAGNOSIS — I10 Essential (primary) hypertension: Secondary | ICD-10-CM

## 2016-03-09 DIAGNOSIS — K5080 Crohn's disease of both small and large intestine without complications: Secondary | ICD-10-CM | POA: Diagnosis not present

## 2016-03-09 DIAGNOSIS — E785 Hyperlipidemia, unspecified: Secondary | ICD-10-CM | POA: Diagnosis not present

## 2016-03-09 DIAGNOSIS — G4733 Obstructive sleep apnea (adult) (pediatric): Secondary | ICD-10-CM | POA: Diagnosis not present

## 2016-03-09 MED ORDER — SIMVASTATIN 20 MG PO TABS: 20.0000 mg | ORAL_TABLET | Freq: Every day | ORAL | 3 refills | Status: DC

## 2016-03-09 MED ORDER — METOPROLOL TARTRATE 50 MG PO TABS: 50.0000 mg | ORAL_TABLET | Freq: Two times a day (BID) | ORAL | 3 refills | Status: DC

## 2016-03-09 MED ORDER — ALLOPURINOL 300 MG PO TABS: 300.0000 mg | ORAL_TABLET | Freq: Every morning | ORAL | 3 refills | Status: DC

## 2016-03-09 MED ORDER — LOSARTAN POTASSIUM 100 MG PO TABS: 100.0000 mg | ORAL_TABLET | Freq: Every day | ORAL | 3 refills | Status: DC

## 2016-03-09 NOTE — Assessment & Plan Note (Signed)
Discuss new guidelines regarding HTN today. Patient declines more aggressive therapy at this time. Continue current medications: Losartan, metoprolol, Norvasc.

## 2016-03-09 NOTE — Assessment & Plan Note (Signed)
At goal on Zocor. Continue.

## 2016-03-09 NOTE — Assessment & Plan Note (Signed)
Stable on CPAP 

## 2016-03-09 NOTE — Assessment & Plan Note (Signed)
Stable on Mesalamine.

## 2016-03-09 NOTE — Patient Instructions (Signed)
Continue your meds.  Follow up in 6 months.  Keep an eye on your pressures for me.  Take care  Dr. Lacinda Axon

## 2016-03-09 NOTE — Progress Notes (Signed)
Pre visit review using our clinic review tool, if applicable. No additional management support is needed unless otherwise documented below in the visit note. 

## 2016-03-09 NOTE — Progress Notes (Signed)
Subjective:  Patient ID: Thomas David, male    DOB: [birth year]  Age: 81 y.o. MRN: [id number]  CC: Follow up  HPI:  81 year old male with hypertension, hyperlipidemia, OSA, BPH, Crohns presents for follow up.  HTN  Reasonably well controlled for age.  He is currently on losartan, metoprolol, and amlodipine  We will discuss new guidelines today.  HLD  At goal on Zocor.   OSA  Stable on CPAP.  Crohn's  Stable on Mesalamine.  Social Hx   Social History   Social History  . Marital status: Married    Spouse name: N/A  . Number of children: N/A  . Years of education: N/A   Social History Main Topics  . Smoking status: Never Smoker  . Smokeless tobacco: Never Used  . Alcohol use No  . Drug use: No  . Sexual activity: No   Other Topics Concern  . None   Social History Narrative   ** Merged History Encounter **       Review of Systems  Constitutional: Negative.   Respiratory: Negative.   Cardiovascular: Negative.    Objective:  BP (!) 158/84   Pulse 71   Temp 98.3 F (36.8 C) (Oral)   Wt 236 lb 6.4 oz (107.2 kg)   SpO2 96%   BMI 34.91 kg/m   BP/Weight 03/09/2016 02/05/2016 24/82/5003  Systolic BP 704 888 916  Diastolic BP 84 80 78  Wt. (Lbs) 236.4 236.6 -  BMI 34.91 34.94 -   Physical Exam  Constitutional: He is oriented to person, place, and time. He appears well-developed. No distress.  Cardiovascular: Normal rate and regular rhythm.   Pulmonary/Chest: Effort normal and breath sounds normal.  Abdominal: Soft. He exhibits no distension. There is no tenderness. There is no rebound and no guarding.  Neurological: He is oriented to person, place, and time.  Psychiatric: He has a normal mood and affect.  Vitals reviewed.  Lab Results  Component Value Date   WBC 7.9 01/06/2016   HGB 14.1 01/06/2016   HCT 41.8 01/06/2016   PLT 210.0 01/06/2016   GLUCOSE 114 (H) 01/16/2016   CHOL 135 01/06/2016   TRIG 271.0 (H) 01/06/2016   HDL 37.90 (L)  01/06/2016   LDLDIRECT 53.0 01/06/2016   LDLCALC 45 10/11/2014   ALT 21 01/06/2016   AST 25 01/06/2016   NA 141 01/16/2016   K 4.0 01/16/2016   CL 105 01/16/2016   CREATININE 0.80 01/16/2016   BUN 12 01/16/2016   CO2 29 01/16/2016   TSH 4.280 02/13/2013   HGBA1C 6.3 01/06/2016   MICROALBUR 48.0 (H) 01/06/2016   Assessment & Plan:   Problem List Items Addressed This Visit    Crohn's disease of small and large intestines (HCC)    Stable on Mesalamine.      Essential hypertension    Discuss new guidelines regarding HTN today. Patient declines more aggressive therapy at this time. Continue current medications: Losartan, metoprolol, Norvasc.      Relevant Medications   metoprolol (LOPRESSOR) 50 MG tablet   losartan (COZAAR) 100 MG tablet   simvastatin (ZOCOR) 20 MG tablet   Hyperlipemia    At goal on Zocor. Continue.      Relevant Medications   metoprolol (LOPRESSOR) 50 MG tablet   losartan (COZAAR) 100 MG tablet   simvastatin (ZOCOR) 20 MG tablet   Obstructive sleep apnea    Stable on CPAP.        Follow-up: Return in about 6 months (  around 09/06/2016).  [place]

## 2016-03-30 ENCOUNTER — Other Ambulatory Visit: Payer: Self-pay | Admitting: Family Medicine

## 2016-03-30 NOTE — Telephone Encounter (Signed)
Refilled 01/06/16. Pt last seen 03/09/16. Please advise?

## 2016-04-10 ENCOUNTER — Other Ambulatory Visit: Payer: Self-pay | Admitting: Family Medicine

## 2016-04-10 MED ORDER — FLUTICASONE PROPIONATE 50 MCG/ACT NA SUSP: NASAL | 0 refills | Status: DC

## 2016-04-13 DIAGNOSIS — G4733 Obstructive sleep apnea (adult) (pediatric): Secondary | ICD-10-CM | POA: Diagnosis not present

## 2016-05-18 ENCOUNTER — Telehealth: Payer: Self-pay | Admitting: *Deleted

## 2016-05-18 MED ORDER — AMLODIPINE BESYLATE 5 MG PO TABS: 5.0000 mg | ORAL_TABLET | Freq: Every day | ORAL | 3 refills | Status: DC

## 2016-05-18 NOTE — Telephone Encounter (Signed)
Pt called and was told rx was sent to pharmacy.

## 2016-05-18 NOTE — Telephone Encounter (Signed)
Patient has requested a medication refilled for amlodipine  90 day supply  Pharmacy [facility]

## 2016-06-04 ENCOUNTER — Encounter (INDEPENDENT_AMBULATORY_CARE_PROVIDER_SITE_OTHER): Payer: Self-pay | Admitting: Internal Medicine

## 2016-07-01 ENCOUNTER — Encounter (INDEPENDENT_AMBULATORY_CARE_PROVIDER_SITE_OTHER): Payer: Self-pay

## 2016-07-01 ENCOUNTER — Ambulatory Visit (INDEPENDENT_AMBULATORY_CARE_PROVIDER_SITE_OTHER): Payer: Medicare Other | Admitting: Internal Medicine

## 2016-07-01 ENCOUNTER — Encounter (INDEPENDENT_AMBULATORY_CARE_PROVIDER_SITE_OTHER): Payer: Self-pay | Admitting: Internal Medicine

## 2016-07-01 VITALS — BP 160/80 | Temp 98.4°F | Ht 69.0 in | Wt 234.0 lb

## 2016-07-01 DIAGNOSIS — K5000 Crohn's disease of small intestine without complications: Secondary | ICD-10-CM

## 2016-07-01 MED ORDER — MESALAMINE ER 500 MG PO CPCR: 500.0000 mg | ORAL_CAPSULE | Freq: Four times a day (QID) | ORAL | 3 refills | Status: DC

## 2016-07-01 MED ORDER — MESALAMINE ER 500 MG PO CPCR: 1000.0000 mg | ORAL_CAPSULE | Freq: Two times a day (BID) | ORAL | 11 refills | Status: DC

## 2016-07-01 NOTE — Patient Instructions (Signed)
Continue present medications. OV in one year.

## 2016-07-01 NOTE — Progress Notes (Addendum)
Subjective:    Patient ID: Thomas David, male    DOB: 05/28/1935, 81 y.o.   MRN: [id number]  HPI hee today for f/u. This is a scheduled visit. Lat seen in May of 2017.  Hx of Crohn's disease. He tells me he is doing good.  He says on Memorial Day he ate ribs. He became constipated. Did not have a BM x 1 1/2 days. He did have a BM today. Stools are soft. No abdominal pain. No melena or BRRB He says today he is okay.  Lives in a retirement community in [place].  He is very active.   CBC    Component Value Date/Time   WBC 7.9 01/06/2016 0919   RBC 4.71 01/06/2016 0919   HGB 14.1 01/06/2016 0919   HCT 41.8 01/06/2016 0919   PLT 210.0 01/06/2016 0919   MCV 88.8 01/06/2016 0919   MCH 28.8 03/16/2014 1329   MCHC 33.7 01/06/2016 0919   RDW 14.7 01/06/2016 0919   LYMPHSABS 2.2 03/16/2014 1329   MONOABS 0.7 03/16/2014 1329   EOSABS 0.1 03/16/2014 1329   BASOSABS 0.0 03/16/2014 1329      05/23/2014 EGD/ED,Colonoscopy: GERD. Crohns disease and polyp:  Impression:  EGD findings; Circumferential distal esophageal ring proximal to GE junction. It was disrupted by passing 10 [place] Maloney dilator which also resulted in small linear mucosal disruption at cervical esophagus indicative of disrupted web. Healed esophagitis. Small sliding hiatal hernia. Normal examination of stomach was a second part of duodenum.  Colonoscopy findings; Normal mucosa of distal small bowel. Noncritical ileocolonic anastomotic stricture with ulceration and inflammatory changes to mucosa. Biopsy taken. Single diverticulum distal to anastomosis. Mucosa of rest of the colon and rectum was normal. Review of Systems Past Medical History:  Diagnosis Date  . Arthritis   . Chronic lower back pain   . Crohn's disease ([place])   . GERD (gastroesophageal reflux disease)   . Gout   . History of kidney stones 40 years ago  . Hypercholesteremia   . Hypertension   . Prediabetes     Past Surgical History:    Procedure Laterality Date  . COLECTOMY  1973   "part of large intestines" (05/04/2012)  . COLON RESECTION    . COLONOSCOPY    . COLONOSCOPY N/A 05/23/2014   Procedure: COLONOSCOPY;  Surgeon: Rogene Houston, MD;  [facility];  Service: Endoscopy;  Laterality: N/A;  1200  . ESOPHAGEAL DILATION N/A 05/23/2014   Procedure: ESOPHAGEAL DILATION;  Surgeon: Rogene Houston, MD;  [facility];  Service: Endoscopy;  Laterality: N/A;  . ESOPHAGOGASTRODUODENOSCOPY N/A 03/16/2014   Procedure: esophageal dilation ;  Surgeon: Rogene Houston, MD;  [facility];  Service: Endoscopy;  Laterality: N/A;  . ESOPHAGOGASTRODUODENOSCOPY N/A 05/23/2014   Procedure: ESOPHAGOGASTRODUODENOSCOPY (EGD);  Surgeon: Rogene Houston, MD;  [facility];  Service: Endoscopy;  Laterality: N/A;  . [place]  . KIDNEY STONE SURGERY  1970's  . PARTIAL COLECTOMY  ~ 40 years ago   some of large and small  . PROSTATE SURGERY    . THYROIDECTOMY Right 05/04/2012   Procedure: RIGHT HEMI THYROIDECTOMY;  Surgeon: Ascencion Dike, MD;  Location: [facility];  Service: ENT;  Laterality: Right;  . THYROIDECTOMY    . TONSILLECTOMY AND ADENOIDECTOMY     as a child  . TRANSURETHRAL PROSTATECTOMY WITH GYRUS INSTRUMENTS N/A 07/11/2012   Procedure: TRANSURETHRAL PROSTATECTOMY WITH GYRUS INSTRUMENTS;  Surgeon: Thana Farr  Karsten Ro, MD;  Location: [facility];  Service: Urology;  Laterality: N/A;  . UPPER GASTROINTESTINAL ENDOSCOPY      No Known Allergies  Current Outpatient Prescriptions on File Prior to Visit  Medication Sig Dispense Refill  . allopurinol (ZYLOPRIM) 300 MG tablet Take 1 tablet (300 mg total) by mouth every morning. 90 tablet 3  . amLODipine (NORVASC) 5 MG tablet Take 1 tablet (5 mg total) by mouth daily. 90 tablet 3  . aspirin EC 81 MG tablet Take 81 mg by mouth at bedtime.    . fluticasone (FLONASE) 50 MCG/ACT nasal spray USE 1 SPRAY INTO BOTH  NOSTRILS DAILY. 32 g 0  . losartan (COZAAR)  100 MG tablet Take 1 tablet (100 mg total) by mouth daily. 90 tablet 3  . mesalamine (PENTASA) 500 MG CR capsule Take 1,000 mg by mouth 2 (two) times daily.     . metoprolol (LOPRESSOR) 50 MG tablet Take 1 tablet (50 mg total) by mouth 2 (two) times daily. 180 tablet 3  . omeprazole (PRILOSEC) 20 MG capsule TAKE 1 CAPSULE BY MOUTH  DAILY 90 capsule 3  . OVER THE COUNTER MEDICATION Take 30 mLs by mouth every evening. Xango Juice.    Marland Kitchen oxybutynin (DITROPAN-XL) 5 MG 24 hr tablet TAKE 1 TABLET BY MOUTH AT  BEDTIME 90 tablet 1  . Probiotic Product ([place]) Take by mouth daily.    . simvastatin (ZOCOR) 20 MG tablet Take 1 tablet (20 mg total) by mouth daily. 90 tablet 3  . vitamin B-12 (CYANOCOBALAMIN) 1000 MCG tablet Take 1,000 mcg by mouth 2 (two) times daily.     No current facility-administered medications on file prior to visit.         Objective:   Physical Exam  Blood pressure (!) 160/80, temperature 98.4 F (36.9 C), height 5' 9"  (1.753 m), weight 234 lb (106.1 kg).  Alert and oriented. Skin warm and dry. Oral mucosa is moist.   . Sclera anicteric, conjunctivae is pink. Thyroid not enlarged. No cervical lymphadenopathy. Lungs clear. Heart regular rate and rhythm.  Abdomen is soft. Bowel sounds are positive. No hepatomegaly. No abdominal masses felt. No tenderness.  No edema to lower extremities.        Assessment & Plan:  Crohns disease. He says he is doing well. Rx for Pentasa sent to his pharmacy.   OV in 1 year.

## 2016-07-16 ENCOUNTER — Telehealth (INDEPENDENT_AMBULATORY_CARE_PROVIDER_SITE_OTHER): Payer: Self-pay | Admitting: Internal Medicine

## 2016-07-16 NOTE — Telephone Encounter (Signed)
Sent to Dr.Rehman, He will call patient over the wekend.

## 2016-07-16 NOTE — Telephone Encounter (Signed)
Patient called, stated that he has been having some stool issues for the last three weeks, hurting in his stomach, burping and a lot of gas on both ends.  He wants to talk to Dr. Laural Golden or see if he can recommend something.  He would like an appointment with him asap, but we do not have anything until October.  He stated that he does not see Terri and if he can't see Dr. Laural Golden asap, he is in [place] and he'll do something there in [place].  [phone]

## 2016-07-17 ENCOUNTER — Other Ambulatory Visit (INDEPENDENT_AMBULATORY_CARE_PROVIDER_SITE_OTHER): Payer: Self-pay | Admitting: Internal Medicine

## 2016-07-17 MED ORDER — METRONIDAZOLE 250 MG PO TABS: 250.0000 mg | ORAL_TABLET | Freq: Three times a day (TID) | ORAL | 0 refills | Status: DC

## 2016-07-17 MED ORDER — CIPROFLOXACIN HCL 500 MG PO TABS: 500.0000 mg | ORAL_TABLET | Freq: Two times a day (BID) | ORAL | 0 refills | Status: DC

## 2016-07-17 NOTE — Telephone Encounter (Signed)
Call returned. Patient may be having mild flareup of Crohn disease. He is not passing blood or febrile. Will treat with Cipro and metronidazole. Patient to call office with progress report in one week.

## 2016-07-17 NOTE — Telephone Encounter (Signed)
I called the patient and spoke to his wife.  I told her that Dr. Laural Golden was given his message and that Dr. Laural Golden would be calling him over the weekend.  She stated that something needed to be done.  I did tell her that I would be happy to bring him in to see Terri, but he had made it clear in his message yesterday that he would not see Terri.  She questioned if they needed to wait around all weekend for Dr. Olevia Perches call.  I told her I didn't know how to answer that, but he did say that he would call him over the weekend.

## 2016-07-20 NOTE — Telephone Encounter (Signed)
Noted the the patient is being treated with Cipro and Flagyl. He is to call the office with a progress report in 1 week, this should be around 07/24/2016.

## 2016-07-24 ENCOUNTER — Telehealth (INDEPENDENT_AMBULATORY_CARE_PROVIDER_SITE_OTHER): Payer: Self-pay | Admitting: Internal Medicine

## 2016-07-24 NOTE — Telephone Encounter (Signed)
Forwarded to [place] for review.

## 2016-07-24 NOTE — Telephone Encounter (Signed)
Patient's call returned. He is better but not back to his baseline. Feels tired and bloated.  Will proceed with CBC with differential, sedimentation rate and CRP. Patient will stop by the office to pick up paper work next week.

## 2016-07-24 NOTE — Telephone Encounter (Signed)
Patient called, stated Dr. Laural Golden asked him to call today to give a report.  He stated that he started the medication last Friday, it upset his stomach, but then got okay.  Pain is gone.  Bowels are not normal and stomach feels full all the time.  No energy.  He stated that he still has 3-4 days left of his medication.  Home - [phone] Cell - [phone]

## 2016-07-27 ENCOUNTER — Other Ambulatory Visit (INDEPENDENT_AMBULATORY_CARE_PROVIDER_SITE_OTHER): Payer: Self-pay | Admitting: *Deleted

## 2016-07-27 DIAGNOSIS — K50819 Crohn's disease of both small and large intestine with unspecified complications: Secondary | ICD-10-CM

## 2016-07-27 NOTE — Telephone Encounter (Signed)
Lab request have been completed. Patient is to come by the office this week to pick up orders to have lab work completed.

## 2016-07-28 ENCOUNTER — Other Ambulatory Visit: Payer: Self-pay | Admitting: Family Medicine

## 2016-07-28 LAB — CBC WITH DIFFERENTIAL/PLATELET
BASOS PCT: 0 %
Basophils Absolute: 0 cells/uL (ref 0–200)
EOS PCT: 2 %
Eosinophils Absolute: 234 cells/uL (ref 15–500)
HEMATOCRIT: 39.1 % (ref 38.5–50.0)
Hemoglobin: 12.4 g/dL — ABNORMAL LOW (ref 13.2–17.1)
LYMPHS PCT: 17 %
Lymphs Abs: 1989 cells/uL (ref 850–3900)
MCH: 27.3 pg (ref 27.0–33.0)
MCHC: 31.7 g/dL — AB (ref 32.0–36.0)
MCV: 86.1 fL (ref 80.0–100.0)
MONO ABS: 936 {cells}/uL (ref 200–950)
MONOS PCT: 8 %
MPV: 9.8 fL (ref 7.5–12.5)
NEUTROS PCT: 73 %
Neutro Abs: 8541 cells/uL — ABNORMAL HIGH (ref 1500–7800)
PLATELETS: 442 10*3/uL — AB (ref 140–400)
RBC: 4.54 MIL/uL (ref 4.20–5.80)
RDW: 14.8 % (ref 11.0–15.0)
WBC: 11.7 10*3/uL — AB (ref 3.8–10.8)

## 2016-07-29 LAB — C-REACTIVE PROTEIN: CRP: 61.5 mg/L — ABNORMAL HIGH (ref <8.0)

## 2016-07-29 LAB — SEDIMENTATION RATE: SED RATE: 87 mm/h — AB (ref 0–20)

## 2016-08-13 ENCOUNTER — Encounter (INDEPENDENT_AMBULATORY_CARE_PROVIDER_SITE_OTHER): Payer: Self-pay | Admitting: *Deleted

## 2016-08-13 ENCOUNTER — Other Ambulatory Visit (INDEPENDENT_AMBULATORY_CARE_PROVIDER_SITE_OTHER): Payer: Self-pay | Admitting: *Deleted

## 2016-08-13 DIAGNOSIS — K50818 Crohn's disease of both small and large intestine with other complication: Secondary | ICD-10-CM

## 2016-08-18 DIAGNOSIS — K50818 Crohn's disease of both small and large intestine with other complication: Secondary | ICD-10-CM | POA: Diagnosis not present

## 2016-08-19 ENCOUNTER — Other Ambulatory Visit (INDEPENDENT_AMBULATORY_CARE_PROVIDER_SITE_OTHER): Payer: Self-pay | Admitting: Internal Medicine

## 2016-08-19 LAB — CBC
HEMATOCRIT: 37.2 % — AB (ref 38.5–50.0)
HEMOGLOBIN: 12 g/dL — AB (ref 13.2–17.1)
MCH: 28.3 pg (ref 27.0–33.0)
MCHC: 32.3 g/dL (ref 32.0–36.0)
MCV: 87.7 fL (ref 80.0–100.0)
MPV: 9.8 fL (ref 7.5–12.5)
Platelets: 282 10*3/uL (ref 140–400)
RBC: 4.24 MIL/uL (ref 4.20–5.80)
RDW: 15.4 % — ABNORMAL HIGH (ref 11.0–15.0)
WBC: 9.2 10*3/uL (ref 3.8–10.8)

## 2016-08-19 LAB — C-REACTIVE PROTEIN: CRP: 43.4 mg/L — ABNORMAL HIGH (ref <8.0)

## 2016-08-19 LAB — SEDIMENTATION RATE: Sed Rate: 55 mm/hr — ABNORMAL HIGH (ref 0–20)

## 2016-08-19 MED ORDER — BUDESONIDE 3 MG PO CPEP: 3.0000 mg | ORAL_CAPSULE | Freq: Every day | ORAL | 0 refills | Status: DC

## 2016-08-20 ENCOUNTER — Telehealth (INDEPENDENT_AMBULATORY_CARE_PROVIDER_SITE_OTHER): Payer: Self-pay | Admitting: Internal Medicine

## 2016-08-20 MED ORDER — BUDESONIDE 3 MG PO CPEP: 3.0000 mg | ORAL_CAPSULE | Freq: Every day | ORAL | 0 refills | Status: DC

## 2016-08-20 NOTE — Telephone Encounter (Signed)
Pt stated need to change pharmacy [facility] to get cheaper price and to make sure only for 30 day...  Resend the Rx. Budesonide 64m 1 qid #30 and change pharmacy to [facility]

## 2016-08-26 ENCOUNTER — Telehealth (INDEPENDENT_AMBULATORY_CARE_PROVIDER_SITE_OTHER): Payer: Self-pay | Admitting: Internal Medicine

## 2016-08-26 NOTE — Progress Notes (Signed)
The patient was offered an appointment for 08/27/16 and it was not able to come in.  He was then given an appointment for 09/03/16.  The patient has been advised of his appointment and he has confirmed.

## 2016-08-26 NOTE — Telephone Encounter (Signed)
Patient was offered an appointment with Dr. Laural Golden for Thursday, August 27, 2016 at 3:45pm and he stated that he could not do that.  He was then given an appointment for Thursday, September 03, 2016 with Dr. Laural Golden, the patient has confirmed this appointment.

## 2016-08-26 NOTE — Progress Notes (Signed)
Sent a staff message to [place] to see when we can get this patient in the office.

## 2016-08-27 ENCOUNTER — Ambulatory Visit (INDEPENDENT_AMBULATORY_CARE_PROVIDER_SITE_OTHER): Payer: Medicare Other | Admitting: Internal Medicine

## 2016-09-03 ENCOUNTER — Ambulatory Visit (INDEPENDENT_AMBULATORY_CARE_PROVIDER_SITE_OTHER): Payer: Medicare Other | Admitting: Internal Medicine

## 2016-09-07 ENCOUNTER — Ambulatory Visit: Payer: Self-pay | Admitting: Family Medicine

## 2016-09-08 ENCOUNTER — Encounter (INDEPENDENT_AMBULATORY_CARE_PROVIDER_SITE_OTHER): Payer: Self-pay

## 2016-09-08 ENCOUNTER — Encounter (INDEPENDENT_AMBULATORY_CARE_PROVIDER_SITE_OTHER): Payer: Self-pay | Admitting: Internal Medicine

## 2016-09-08 ENCOUNTER — Ambulatory Visit (INDEPENDENT_AMBULATORY_CARE_PROVIDER_SITE_OTHER): Payer: Medicare Other | Admitting: Internal Medicine

## 2016-09-08 VITALS — BP 130/70 | HR 72 | Temp 98.1°F | Resp 18 | Ht 69.0 in | Wt 223.2 lb

## 2016-09-08 DIAGNOSIS — K5000 Crohn's disease of small intestine without complications: Secondary | ICD-10-CM | POA: Diagnosis not present

## 2016-09-08 DIAGNOSIS — R1900 Intra-abdominal and pelvic swelling, mass and lump, unspecified site: Secondary | ICD-10-CM

## 2016-09-08 NOTE — Progress Notes (Signed)
Presenting complaint;  History of Crohn's disease. Recent bout with abdominal pain and diarrhea.  Database and Subjective:  Patient is 81 year old Caucasian male with history of ileocolonic Crohn's disease or remote right hemicolectomy who was last seen in the office on 07/02/2015 and appeared to be doing well on Pentasa. He called office on 07/16/2016 complaining of abdominal pain burping and flatulence and diarrhea. I talked with patient on 07/17/2016 and felt he may have gastroenteritis and he was begun on Cipro and metronidazole. Patient call with progress report one week later stating that he was better but not back to his baseline. He was therefore advised to go to the lab for blood work. He had blood work on 07/27/2016. WBC was 11.7 H&H 12.4 and 39.1 and platelet count was 442K. Sedimentation rate was 87 and CRP 61.5(normal up to 8). Therefore patient was advised to return for follow-up visit along with blood work. He had repeat blood work on 08/18/2016. WBC was 9.2 H&H was 12 and 37.2 and platelet count was 280 2K. Sedimentation rate was 55 and CRP was 43.4. Patient was begun on budesonide and this visit was arranged.  He states he is feeling much better as far as GI symptoms are concerned; he is now constipated and is using polyethylene glycol an as-needed basis. He is not having abdominal cramping or gas pain. He has lost 11 pounds since his last visit of 07/01/2016. Patient just found out that his wife has metastatic pancreatic carcinoma and she is had  regional hospital in [place]. Family has been told that she has very poor immediate prognosis.   Current Medications: Outpatient Encounter Prescriptions as of 09/08/2016  Medication Sig  . allopurinol (ZYLOPRIM) 300 MG tablet Take 1 tablet (300 mg total) by mouth every morning.  Marland Kitchen amLODipine (NORVASC) 5 MG tablet Take 1 tablet (5 mg total) by mouth daily.  Marland Kitchen aspirin EC 81 MG tablet Take 81 mg by mouth at  bedtime.  . budesonide (ENTOCORT EC) 3 MG 24 hr capsule Take 1 capsule (3 mg total) by mouth daily.  . ciprofloxacin (CIPRO) 500 MG tablet Take 1 tablet (500 mg total) by mouth 2 (two) times daily.  . fluticasone (FLONASE) 50 MCG/ACT nasal spray USE 1 SPRAY INTO BOTH  NOSTRILS DAILY.  Marland Kitchen losartan (COZAAR) 100 MG tablet Take 1 tablet (100 mg total) by mouth daily.  . mesalamine (PENTASA) 500 MG CR capsule Take 1 capsule (500 mg total) by mouth 4 (four) times daily.  . metoprolol (LOPRESSOR) 50 MG tablet Take 1 tablet (50 mg total) by mouth 2 (two) times daily.  . metroNIDAZOLE (FLAGYL) 250 MG tablet Take 1 tablet (250 mg total) by mouth 3 (three) times daily.  Marland Kitchen omeprazole (PRILOSEC) 20 MG capsule TAKE 1 CAPSULE BY MOUTH  DAILY  . OVER THE COUNTER MEDICATION Take 30 mLs by mouth every evening. Xango Juice.  Marland Kitchen oxybutynin (DITROPAN-XL) 5 MG 24 hr tablet TAKE 1 TABLET BY MOUTH AT  BEDTIME  . Probiotic Product ([place]) Take by mouth daily.  . simvastatin (ZOCOR) 20 MG tablet Take 1 tablet (20 mg total) by mouth daily.  . vitamin B-12 (CYANOCOBALAMIN) 1000 MCG tablet Take 1,000 mcg by mouth 2 (two) times daily.   No facility-administered encounter medications on file as of 09/08/2016.     Objective: Blood pressure 130/70, pulse 72, temperature 98.1 F (36.7 C), temperature source Oral, resp. rate 18, height 5' 9"  (1.753 m), weight 223 lb 3.2 oz (101.2 kg).  Patient is in tears because of his wife's terminal illness No neck masses or thyromegaly noted. Abdomen is full. Bowel sounds are normal. He has low midline scar. Small umbilical hernia noted. He has firm freely movable mass in right mid abdomen. It is not tender. No hepatosplenomegaly. No LE edema or clubbing noted.  Labs/studies Results:   lab data reviewed under database and subjective  Assessment:  #1. Recent illness with burping diarrhea abdominal pain possibly due to relapse of Crohn's disease as he did not respond to  empiric antibiotic therapy and he is responding to budesonide. He appears to be back in remission. However abdominal exam reveals a mass in right midabdomen which is somewhat firm and nontender and hopefully is an inflammatory mass rather than neoplasm.   Plan:  Patient advised to take polyethylene glycol on schedule rather than when necessary so that he can prevent constipation. Patient will go the lab for CBC sedimentation rate and CRP. Abdominopelvic CT with contrast when convenient for the patient. Office visit in 3 months.

## 2016-09-09 ENCOUNTER — Other Ambulatory Visit (INDEPENDENT_AMBULATORY_CARE_PROVIDER_SITE_OTHER): Payer: Self-pay | Admitting: Internal Medicine

## 2016-09-09 DIAGNOSIS — R1907 Generalized intra-abdominal and pelvic swelling, mass and lump: Secondary | ICD-10-CM

## 2016-09-09 DIAGNOSIS — K5090 Crohn's disease, unspecified, without complications: Secondary | ICD-10-CM

## 2016-09-09 NOTE — Patient Instructions (Signed)
Physician will call with results of blood tests and CT scan when completed.

## 2016-09-14 ENCOUNTER — Encounter: Payer: Self-pay | Admitting: Family Medicine

## 2016-09-14 ENCOUNTER — Ambulatory Visit (INDEPENDENT_AMBULATORY_CARE_PROVIDER_SITE_OTHER): Payer: Medicare Other | Admitting: Family Medicine

## 2016-09-14 VITALS — BP 150/78 | HR 70 | Temp 98.3°F | Resp 16 | Wt 224.4 lb

## 2016-09-14 DIAGNOSIS — R7303 Prediabetes: Secondary | ICD-10-CM | POA: Diagnosis not present

## 2016-09-14 DIAGNOSIS — E785 Hyperlipidemia, unspecified: Secondary | ICD-10-CM | POA: Diagnosis not present

## 2016-09-14 DIAGNOSIS — D510 Vitamin B12 deficiency anemia due to intrinsic factor deficiency: Secondary | ICD-10-CM

## 2016-09-14 DIAGNOSIS — I10 Essential (primary) hypertension: Secondary | ICD-10-CM

## 2016-09-14 DIAGNOSIS — D649 Anemia, unspecified: Secondary | ICD-10-CM | POA: Diagnosis not present

## 2016-09-14 DIAGNOSIS — K5080 Crohn's disease of both small and large intestine without complications: Secondary | ICD-10-CM

## 2016-09-14 LAB — COMPREHENSIVE METABOLIC PANEL
ALT: 10 U/L (ref 0–53)
AST: 13 U/L (ref 0–37)
Albumin: 3.4 g/dL — ABNORMAL LOW (ref 3.5–5.2)
Alkaline Phosphatase: 91 U/L (ref 39–117)
BUN: 15 mg/dL (ref 6–23)
CHLORIDE: 102 meq/L (ref 96–112)
CO2: 28 meq/L (ref 19–32)
Calcium: 8.9 mg/dL (ref 8.4–10.5)
Creatinine, Ser: 0.8 mg/dL (ref 0.40–1.50)
GFR: 99.14 mL/min (ref >60.00)
GLUCOSE: 138 mg/dL — AB (ref 70–99)
POTASSIUM: 4.2 meq/L (ref 3.5–5.1)
Sodium: 139 mEq/L (ref 135–145)
Total Bilirubin: 0.9 mg/dL (ref 0.2–1.2)
Total Protein: 6.6 g/dL (ref 6.0–8.3)

## 2016-09-14 LAB — LIPID PANEL
CHOL/HDL RATIO: 3
Cholesterol: 107 mg/dL (ref 0–200)
HDL: 36.3 mg/dL — AB (ref >39.00)
LDL Cholesterol: 57 mg/dL (ref 0–99)
NONHDL: 70.48
Triglycerides: 69 mg/dL (ref 0.0–149.0)
VLDL: 13.8 mg/dL (ref 0.0–40.0)

## 2016-09-14 LAB — POCT GLYCOSYLATED HEMOGLOBIN (HGB A1C): Hemoglobin A1C: 6.9

## 2016-09-14 LAB — FOLATE: Folate: >24 ng/mL (ref >5.9)

## 2016-09-14 LAB — VITAMIN B12: Vitamin B-12: 211 pg/mL (ref 211–911)

## 2016-09-14 NOTE — Progress Notes (Signed)
Subjective:  Patient ID: Thomas David, male    DOB: Aug 02, 1935  Age: 81 y.o. MRN: [id number]  CC: Follow up  HPI:  81 year old male with Crohn's, gout, hypertension, hyperlipidemia, GERD, BPH, prediabetes, OSA presents for follow-up.  Patient reports ongoing stress as his wife was diagnosed with pancreatic cancer. She is currently on palliative/hospice care. He's having a difficult time.  Hypertension  Fairly well controlled on amlodipine, metoprolol, losartan.  Hyperlipidemia  Has been stable on Zocor.  Needs labs.  Prediabetes  Needs A1C.   Crohn's disease  Has been experiencing a flare recently. Has seen GI. He has drastically improved on Entocort.  However, anemia noted on laboratory studies by GI. Need to reassess today.  Social Hx   Social History   Social History  . Marital status: Married    Spouse name: N/A  . Number of children: N/A  . Years of education: N/A   Social History Main Topics  . Smoking status: Never Smoker  . Smokeless tobacco: Never Used  . Alcohol use No  . Drug use: No  . Sexual activity: No   Other Topics Concern  . None   Social History Narrative   ** Merged History Encounter **        Review of Systems  Constitutional: Negative.   Gastrointestinal: Positive for abdominal pain and diarrhea. Negative for blood in stool.   Objective:  BP (!) 150/78 (BP Location: Left Arm, Patient Position: Sitting, Cuff Size: Normal)   Pulse 70   Temp 98.3 F (36.8 C) (Oral)   Resp 16   Wt 224 lb 6 oz (101.8 kg)   SpO2 95%   BMI 33.13 kg/m   BP/Weight 09/14/2016 09/08/2016 08/08/2374  Systolic BP 283 151 761  Diastolic BP 78 70 80  Wt. (Lbs) 224.38 223.2 234  BMI 33.13 32.96 34.56   Physical Exam  Constitutional: He is oriented to person, place, and time. He appears well-developed. No distress.  HENT:  Head: Normocephalic and atraumatic.  Cardiovascular: Normal rate and regular rhythm.   No murmur heard. Pulmonary/Chest: Effort  normal. He has no wheezes. He has no rales.  Abdominal: Soft. He exhibits no distension. There is no tenderness.  Neurological: He is alert and oriented to person, place, and time.  Psychiatric: He has a normal mood and affect.  Vitals reviewed.   Lab Results  Component Value Date   WBC 9.2 08/18/2016   HGB 12.0 (L) 08/18/2016   HCT 37.2 (L) 08/18/2016   PLT 282 08/18/2016   GLUCOSE 114 (H) 01/16/2016   CHOL 135 01/06/2016   TRIG 271.0 (H) 01/06/2016   HDL 37.90 (L) 01/06/2016   LDLDIRECT 53.0 01/06/2016   LDLCALC 45 10/11/2014   ALT 21 01/06/2016   AST 25 01/06/2016   NA 141 01/16/2016   K 4.0 01/16/2016   CL 105 01/16/2016   CREATININE 0.80 01/16/2016   BUN 12 01/16/2016   CO2 29 01/16/2016   TSH 4.280 02/13/2013   HGBA1C 6.9 09/14/2016   MICROALBUR 48.0 (H) 01/06/2016    Assessment & Plan:   Problem List Items Addressed This Visit    Crohn's disease of small and large intestines ([place])    Recent flare improving. Labs today, including further eval of anemia.       Essential hypertension - Primary    Reasonably well controlled. Continue amlodipine, metoprolol, losartan.      Relevant Orders   Comprehensive metabolic panel   Hyperlipemia    Stable on  Zocor. Continue. Lipid panel today.      Relevant Orders   Lipid panel   Prediabetes    A1C today.      Relevant Orders   POCT HgB A1C (Completed)   Pernicious anemia    Other Visit Diagnoses    Anemia, unspecified type       Relevant Orders   Iron, TIBC and Ferritin Panel   Vitamin B12   Folate   CBC     Follow-up: Return in about 6 months (around 03/17/2017).  [place]

## 2016-09-14 NOTE — Assessment & Plan Note (Signed)
Stable on Zocor. Continue. Lipid panel today.

## 2016-09-14 NOTE — Patient Instructions (Signed)
Continue your meds.  We will call with your lab results.  My sincerest sympathies to you and your family.  Dr. Lacinda Axon

## 2016-09-14 NOTE — Assessment & Plan Note (Signed)
Recent flare improving. Labs today, including further eval of anemia.

## 2016-09-14 NOTE — Assessment & Plan Note (Signed)
Reasonably well controlled. Continue amlodipine, metoprolol, losartan.

## 2016-09-14 NOTE — Assessment & Plan Note (Signed)
A1C today.

## 2016-09-15 ENCOUNTER — Other Ambulatory Visit: Payer: Self-pay | Admitting: Family Medicine

## 2016-09-15 LAB — IRON,TIBC AND FERRITIN PANEL
%SAT: 6 % — ABNORMAL LOW (ref 15–60)
FERRITIN: 208 ng/mL (ref 20–380)
IRON: 13 ug/dL — AB (ref 50–180)
TIBC: 208 ug/dL — ABNORMAL LOW (ref 250–425)

## 2016-09-15 LAB — CBC
HEMATOCRIT: 35.9 % — AB (ref 38.5–50.0)
Hemoglobin: 11.9 g/dL — ABNORMAL LOW (ref 13.2–17.1)
MCH: 28.5 pg (ref 27.0–33.0)
MCHC: 33.1 g/dL (ref 32.0–36.0)
MCV: 85.9 fL (ref 80.0–100.0)
MPV: 10.5 fL (ref 7.5–12.5)
Platelets: 209 10*3/uL (ref 140–400)
RBC: 4.18 MIL/uL — ABNORMAL LOW (ref 4.20–5.80)
RDW: 17 % — AB (ref 11.0–15.0)
WBC: 9.4 10*3/uL (ref 3.8–10.8)

## 2016-09-15 MED ORDER — FERROUS SULFATE 325 (65 FE) MG PO TABS: 325.0000 mg | ORAL_TABLET | Freq: Two times a day (BID) | ORAL | 3 refills | Status: DC

## 2016-09-17 ENCOUNTER — Ambulatory Visit (HOSPITAL_COMMUNITY)
Admission: RE | Admit: 2016-09-17 | Discharge: 2016-09-17 | Disposition: A | Discharge status: Home / Self Care | Payer: Medicare Other | Source: Ambulatory Visit | Attending: Internal Medicine | Admitting: Internal Medicine

## 2016-09-17 ENCOUNTER — Encounter (HOSPITAL_COMMUNITY): Payer: Self-pay

## 2016-09-17 DIAGNOSIS — E279 Disorder of adrenal gland, unspecified: Secondary | ICD-10-CM | POA: Insufficient documentation

## 2016-09-17 DIAGNOSIS — K5090 Crohn's disease, unspecified, without complications: Secondary | ICD-10-CM | POA: Diagnosis not present

## 2016-09-17 DIAGNOSIS — K449 Diaphragmatic hernia without obstruction or gangrene: Secondary | ICD-10-CM | POA: Insufficient documentation

## 2016-09-17 DIAGNOSIS — K828 Other specified diseases of gallbladder: Secondary | ICD-10-CM | POA: Insufficient documentation

## 2016-09-17 DIAGNOSIS — I700 Atherosclerosis of aorta: Secondary | ICD-10-CM | POA: Insufficient documentation

## 2016-09-17 DIAGNOSIS — R1907 Generalized intra-abdominal and pelvic swelling, mass and lump: Secondary | ICD-10-CM | POA: Diagnosis not present

## 2016-09-17 DIAGNOSIS — R109 Unspecified abdominal pain: Secondary | ICD-10-CM | POA: Diagnosis not present

## 2016-09-17 MED ORDER — IOPAMIDOL (ISOVUE-300) INJECTION 61%
100.0000 mL | Freq: Once | INTRAVENOUS | Status: AC | PRN
  Administered 2016-09-17: 100 mL via INTRAVENOUS

## 2016-09-18 ENCOUNTER — Telehealth: Payer: Self-pay | Admitting: Family Medicine

## 2016-09-18 NOTE — Telephone Encounter (Signed)
Spoke with patient states he had CT scan AB/pelvis GI MD recommends that he have gallbladder removed , patient wants to be referred to local surgeon Dr Bary Castilla.  Patient was referred to [facility] has appt on 8/23//18.  Patient would like to stay in [place].  He states he is no pain.  His wife is under Hospice care.  He was told if pain increases to go to ER.

## 2016-09-18 NOTE — Telephone Encounter (Signed)
Pt called and wanted to speak with either Dr. Lacinda Axon or the [facility] in regards to seeing a surgeon. Please advise, thank you!  Call pt @ [phone]

## 2016-09-21 NOTE — Telephone Encounter (Signed)
I like [facility]. I would stick with his appt.

## 2016-09-21 NOTE — Telephone Encounter (Signed)
Patient advised of below and verbalized understanding.  

## 2016-09-24 DIAGNOSIS — K8010 Calculus of gallbladder with chronic cholecystitis without obstruction: Secondary | ICD-10-CM | POA: Diagnosis not present

## 2016-09-25 ENCOUNTER — Telehealth: Payer: Self-pay | Admitting: Family Medicine

## 2016-09-25 NOTE — Telephone Encounter (Signed)
Left pt message asking to call Ebony Hail back directly at [phone] to schedule AWV. Thanks!  *NOTE* No hx of AWV

## 2016-10-29 NOTE — Telephone Encounter (Signed)
Scheduled 11/11/16

## 2016-10-30 ENCOUNTER — Encounter
Admission: RE | Admit: 2016-10-30 | Discharge: 2016-10-30 | Disposition: A | Discharge status: Home / Self Care | Payer: Medicare Other | Source: Ambulatory Visit | Attending: Surgery | Admitting: Surgery

## 2016-10-30 ENCOUNTER — Ambulatory Visit: Payer: Self-pay | Admitting: Surgery

## 2016-10-30 DIAGNOSIS — Z01810 Encounter for preprocedural cardiovascular examination: Secondary | ICD-10-CM | POA: Insufficient documentation

## 2016-10-30 DIAGNOSIS — R001 Bradycardia, unspecified: Secondary | ICD-10-CM | POA: Insufficient documentation

## 2016-10-30 DIAGNOSIS — I10 Essential (primary) hypertension: Secondary | ICD-10-CM | POA: Diagnosis not present

## 2016-10-30 NOTE — Patient Instructions (Signed)
  Your procedure is scheduled ID:[id number] Oct. 2, 2018. Report to Same Day Surgery. To find out your arrival time please call [phone] between 1PM - 3PM on Monday Oct. 1, 2018.  Remember: Instructions that are not followed completely may result in serious medical risk, up to and including death, or upon the discretion of your surgeon and anesthesiologist your surgery may need to be rescheduled.    _x___ 1. Do not eat food after midnight night prior to surgery. No gum chewing or hard candies.    May drink the following: water, Gatorade, apple juice, black coffee or tea up until 2 hours prior to arrival time.     ____ 2. No Alcohol for 24 hours before or after surgery.   ____ 3. Bring all medications with you on the day of surgery if instructed.    __x__ 4. Notify your doctor if there is any change in your medical condition     (cold, fever, infections).    _____ 5. No smoking 24 hours prior to surgery.     Do not wear jewelry, make-up, hairpins, clips or nail polish.  Do not wear lotions, powders, or perfumes.   Do not shave 48 hours prior to surgery. Men may shave face and neck.  Do not bring valuables to the hospital.    [facility] is not responsible for any belongings or valuables.               Contacts, dentures or bridgework may not be worn into surgery.  Leave your suitcase in the car. After surgery it may be brought to your room.  For patients admitted to the hospital, discharge time is determined by your treatment team.   Patients discharged the day of surgery will not be allowed to drive home.    Please read over the following fact sheets that you were given:   [facility] Preparing for Surgery  __x__ Take these medicines the morning of surgery with A SIP OF WATER:    1. amLODipine (NORVASC)  2. omeprazole (PRILOSEC), also take an extra dose at bedtime the night prior to surgery.   ____ Fleet Enema (as directed)   _x___ Use CHG Soap as directed on instruction  sheet  ____ Use inhalers on the day of surgery and bring to hospital day of surgery  ____ Stop metformin 2 days prior to surgery    ____ Take 1/2 of usual insulin dose the night before surgery and none on the morning of surgery.   _x__ Stop Eliquis/Coumadin/Plavix/aspirin today  ____ Stop Anti-inflammatories such as Advil, Aleve, Ibuprofen, Motrin, Naproxen,  Naprosyn, Goodies powders or aspirin products. OK to take Tylenol.   ____ Stop supplements until after surgery.    __x__ Bring C-Pap to the hospital.

## 2016-11-02 MED ORDER — CEFAZOLIN SODIUM-DEXTROSE 2-4 GM/100ML-% IV SOLN
2.0000 g | INTRAVENOUS | Status: AC
  Administered 2016-11-03: 2 g via INTRAVENOUS

## 2016-11-02 NOTE — H&P (Signed)
Thomas David  Location: [place] Office  Patient #: [id number]  DOB: [birth year]  Married / Language: English / Race: White  Male    History of Present Illness  The patient is a 81 year old male who presents for evaluation of gall stones. Referred by Dr. Laural Golden. He comes in with a cT scan of 8/16 showing chronic cholecystitis. He began hurting ~[facility] but is not now hurting.He has underlying Crohn's and has a right paramedian incision from a resection many years ago.    His wife recently passed with pancreatic cancer.      I gave him a booklet on lap chole and he has called and scheduled after his wife's passing.  He is aware of the risks.    Problem List/Past Medical   Arthritis   High blood pressure   Crohn's Disease   High Cholesterol     Past Surgical History  Small Bowel Surgery   Thyroid Surgery     Allergies  No Known Drug Allergies     Medication History  Allopurinol (300MG Tablet, Oral) Active.  AmLODIPine Besylate (5MG Tablet, Oral) Active.  Aspirin (81MG Tablet, Oral) Active.  Budesonide (3MG Capsule DR Part, Oral) Active.  Ferrous Sulfate (325 (65 Fe)MG Tablet, Oral) Active.  Fluticasone Propionate (50MCG/ACT Suspension, Nasal) Active.  Losartan Potassium (100MG Tablet, Oral) Active.  Mesalamine ER (500MG Capsule ER, Oral) Active.  Metoprolol Succinate (50MG Tablet ER, Oral) Active.  Omeprazole (20MG Capsule DR, Oral) Active.  Simvastatin (20MG Tablet, Oral) Active.  Cyanocobalamin (1000MCG/ML Solution, Injection) Active.  Medications Reconciled    Social History [name], CMA; 09/24/2016 3:10 PM)  Current tobacco use  Never smoker.  Alcohol use  Moderate alcohol use. quit a long time ago  Caffeine use  Tea.    Family History  Heart Disease  Mother.  High Blood Pressure / Hypertension  Mother.        Review of Systems  Gastrointestinal Present- Abdominal Pain, Change in Bowel Habits and Excessive  gas.    Vitals  Weight: 233.6 lbHeight: 69in  Body Surface Area: 2.21 m Body Mass Index: 34.5 kg/m   Temp.: 98.93F(Oral)  Pulse: 79 (Regular)   BP: 144/76 (Sitting, Left Arm, Standard)    Physical Exam  Older appearing WM NAD HEENT without jaundice Neck supple Chest clear Heart SR without murmurs Abdomen  Note: proturberant and nontender witn right paramedian incision Ext FROM Neuro alert and oriented x 3 .          Assessment & Plan   CHRONIC CHOLECYSTITIS WITH CALCULUS (K80.10)  Plan:  Lap/open cholecystectomy at [place]. Hassell Done, MD, FACS

## 2016-11-03 ENCOUNTER — Ambulatory Visit: Payer: Medicare Other

## 2016-11-03 ENCOUNTER — Encounter: Payer: Self-pay | Admitting: *Deleted

## 2016-11-03 ENCOUNTER — Ambulatory Visit: Payer: Medicare Other | Admitting: Anesthesiology

## 2016-11-03 ENCOUNTER — Inpatient Hospital Stay
Admission: AD | Admit: 2016-11-03 | Discharge: 2016-11-06 | DRG: 445 | Disposition: A | Discharge status: Skilled Nursing Facility | Payer: Medicare Other | Source: Ambulatory Visit | Attending: Surgery | Admitting: Surgery

## 2016-11-03 ENCOUNTER — Encounter
Admission: AD | Disposition: A | Discharge status: Skilled Nursing Facility | Payer: Self-pay | Source: Ambulatory Visit | Attending: Surgery

## 2016-11-03 DIAGNOSIS — Z8249 Family history of ischemic heart disease and other diseases of the circulatory system: Secondary | ICD-10-CM

## 2016-11-03 DIAGNOSIS — M109 Gout, unspecified: Secondary | ICD-10-CM | POA: Diagnosis present

## 2016-11-03 DIAGNOSIS — K5090 Crohn's disease, unspecified, without complications: Secondary | ICD-10-CM | POA: Diagnosis present

## 2016-11-03 DIAGNOSIS — G4733 Obstructive sleep apnea (adult) (pediatric): Secondary | ICD-10-CM | POA: Diagnosis present

## 2016-11-03 DIAGNOSIS — I10 Essential (primary) hypertension: Secondary | ICD-10-CM | POA: Diagnosis not present

## 2016-11-03 DIAGNOSIS — K8010 Calculus of gallbladder with chronic cholecystitis without obstruction: Principal | ICD-10-CM | POA: Diagnosis present

## 2016-11-03 DIAGNOSIS — K219 Gastro-esophageal reflux disease without esophagitis: Secondary | ICD-10-CM | POA: Diagnosis not present

## 2016-11-03 DIAGNOSIS — Z7982 Long term (current) use of aspirin: Secondary | ICD-10-CM

## 2016-11-03 DIAGNOSIS — K632 Fistula of intestine: Secondary | ICD-10-CM | POA: Diagnosis present

## 2016-11-03 DIAGNOSIS — Z48815 Encounter for surgical aftercare following surgery on the digestive system: Secondary | ICD-10-CM | POA: Diagnosis not present

## 2016-11-03 DIAGNOSIS — Z9079 Acquired absence of other genital organ(s): Secondary | ICD-10-CM | POA: Diagnosis not present

## 2016-11-03 DIAGNOSIS — M6281 Muscle weakness (generalized): Secondary | ICD-10-CM | POA: Diagnosis not present

## 2016-11-03 DIAGNOSIS — K823 Fistula of gallbladder: Secondary | ICD-10-CM | POA: Diagnosis present

## 2016-11-03 DIAGNOSIS — N400 Enlarged prostate without lower urinary tract symptoms: Secondary | ICD-10-CM | POA: Diagnosis not present

## 2016-11-03 DIAGNOSIS — Z419 Encounter for procedure for purposes other than remedying health state, unspecified: Secondary | ICD-10-CM

## 2016-11-03 DIAGNOSIS — K810 Acute cholecystitis: Secondary | ICD-10-CM | POA: Diagnosis not present

## 2016-11-03 DIAGNOSIS — Z79899 Other long term (current) drug therapy: Secondary | ICD-10-CM

## 2016-11-03 DIAGNOSIS — R262 Difficulty in walking, not elsewhere classified: Secondary | ICD-10-CM | POA: Diagnosis not present

## 2016-11-03 DIAGNOSIS — E785 Hyperlipidemia, unspecified: Secondary | ICD-10-CM | POA: Diagnosis not present

## 2016-11-03 DIAGNOSIS — Z9049 Acquired absence of other specified parts of digestive tract: Secondary | ICD-10-CM

## 2016-11-03 DIAGNOSIS — E7800 Pure hypercholesterolemia, unspecified: Secondary | ICD-10-CM | POA: Diagnosis not present

## 2016-11-03 DIAGNOSIS — G4730 Sleep apnea, unspecified: Secondary | ICD-10-CM | POA: Diagnosis not present

## 2016-11-03 DIAGNOSIS — K8000 Calculus of gallbladder with acute cholecystitis without obstruction: Secondary | ICD-10-CM | POA: Diagnosis not present

## 2016-11-03 HISTORY — PX: CHOLECYSTECTOMY: SHX55

## 2016-11-03 HISTORY — DX: Failed or difficult intubation, initial encounter: T88.4XXA

## 2016-11-03 LAB — CBC
HCT: 35.7 % — ABNORMAL LOW (ref 40.0–52.0)
HEMOGLOBIN: 11.7 g/dL — AB (ref 13.0–18.0)
MCH: 26.8 pg (ref 26.0–34.0)
MCHC: 32.8 g/dL (ref 32.0–36.0)
MCV: 81.8 fL (ref 80.0–100.0)
Platelets: 285 10*3/uL (ref 150–440)
RBC: 4.37 MIL/uL — ABNORMAL LOW (ref 4.40–5.90)
RDW: 18.7 % — AB (ref 11.5–14.5)
WBC: 11 10*3/uL — ABNORMAL HIGH (ref 3.8–10.6)

## 2016-11-03 LAB — CREATININE, SERUM: CREATININE: 1.08 mg/dL (ref 0.61–1.24)

## 2016-11-03 SURGERY — LAPAROSCOPIC CHOLECYSTECTOMY WITH INTRAOPERATIVE CHOLANGIOGRAM
Anesthesia: General

## 2016-11-03 MED ORDER — ONDANSETRON HCL 4 MG/2ML IJ SOLN: 4.0000 mg | Freq: Four times a day (QID) | INTRAMUSCULAR | Status: DC | PRN

## 2016-11-03 MED ORDER — HEPARIN SODIUM (PORCINE) 5000 UNIT/ML IJ SOLN
5000.0000 [IU] | Freq: Once | INTRAMUSCULAR | Status: AC
  Administered 2016-11-03: 5000 [IU] via SUBCUTANEOUS

## 2016-11-03 MED ORDER — LIDOCAINE HCL (CARDIAC) 20 MG/ML IV SOLN
INTRAVENOUS | Status: DC | PRN
  Administered 2016-11-03: 80 mg via INTRAVENOUS

## 2016-11-03 MED ORDER — PIPERACILLIN-TAZOBACTAM 3.375 G IVPB
3.3750 g | Freq: Three times a day (TID) | INTRAVENOUS | Status: DC
  Administered 2016-11-03 – 2016-11-05 (×6): 3.375 g via INTRAVENOUS
  Filled 2016-11-03 (×7): qty 50

## 2016-11-03 MED ORDER — BUPIVACAINE LIPOSOME 1.3 % IJ SUSP
INTRAMUSCULAR | Status: AC
  Filled 2016-11-03: qty 20

## 2016-11-03 MED ORDER — ACETAMINOPHEN 500 MG PO TABS
1000.0000 mg | ORAL_TABLET | ORAL | Status: AC
  Administered 2016-11-03: 1000 mg via ORAL

## 2016-11-03 MED ORDER — FENTANYL CITRATE (PF) 100 MCG/2ML IJ SOLN
INTRAMUSCULAR | Status: AC
  Filled 2016-11-03: qty 2

## 2016-11-03 MED ORDER — SUGAMMADEX SODIUM 200 MG/2ML IV SOLN
INTRAVENOUS | Status: AC
  Filled 2016-11-03: qty 2

## 2016-11-03 MED ORDER — OXYCODONE HCL 5 MG PO TABS: 5.0000 mg | ORAL_TABLET | Freq: Once | ORAL | Status: DC | PRN

## 2016-11-03 MED ORDER — SUGAMMADEX SODIUM 200 MG/2ML IV SOLN
INTRAVENOUS | Status: DC | PRN
  Administered 2016-11-03: 200 mg via INTRAVENOUS

## 2016-11-03 MED ORDER — ONDANSETRON 4 MG PO TBDP: 4.0000 mg | ORAL_TABLET | Freq: Four times a day (QID) | ORAL | Status: DC | PRN

## 2016-11-03 MED ORDER — DEXAMETHASONE SODIUM PHOSPHATE 10 MG/ML IJ SOLN
INTRAMUSCULAR | Status: DC | PRN
  Administered 2016-11-03: 5 mg via INTRAVENOUS

## 2016-11-03 MED ORDER — AMLODIPINE BESYLATE 5 MG PO TABS
5.0000 mg | ORAL_TABLET | Freq: Every day | ORAL | Status: DC
  Administered 2016-11-04 – 2016-11-06 (×3): 5 mg via ORAL
  Filled 2016-11-03 (×3): qty 1

## 2016-11-03 MED ORDER — CELECOXIB 200 MG PO CAPS
ORAL_CAPSULE | ORAL | Status: AC
  Administered 2016-11-03: 200 mg via ORAL
  Filled 2016-11-03: qty 1

## 2016-11-03 MED ORDER — CHLORHEXIDINE GLUCONATE CLOTH 2 % EX PADS: 6.0000 | MEDICATED_PAD | Freq: Once | CUTANEOUS | Status: DC

## 2016-11-03 MED ORDER — SODIUM CHLORIDE 0.9 % IV SOLN: INTRAVENOUS | Status: DC

## 2016-11-03 MED ORDER — ROCURONIUM BROMIDE 100 MG/10ML IV SOLN
INTRAVENOUS | Status: DC | PRN
  Administered 2016-11-03: 10 mg via INTRAVENOUS
  Administered 2016-11-03: 35 mg via INTRAVENOUS
  Administered 2016-11-03: 5 mg via INTRAVENOUS
  Administered 2016-11-03: 10 mg via INTRAVENOUS

## 2016-11-03 MED ORDER — HYDROCODONE-ACETAMINOPHEN 5-325 MG PO TABS
1.0000 | ORAL_TABLET | ORAL | Status: DC | PRN
  Administered 2016-11-03 – 2016-11-04 (×3): 2 via ORAL
  Administered 2016-11-05 (×2): 1 via ORAL
  Administered 2016-11-06: 2 via ORAL
  Filled 2016-11-03: qty 1
  Filled 2016-11-03: qty 2
  Filled 2016-11-03: qty 1
  Filled 2016-11-03 (×3): qty 2

## 2016-11-03 MED ORDER — LACTATED RINGERS IV SOLN
INTRAVENOUS | Status: DC | PRN
  Administered 2016-11-03: 13:00:00 via INTRAVENOUS

## 2016-11-03 MED ORDER — CELECOXIB 200 MG PO CAPS
200.0000 mg | ORAL_CAPSULE | ORAL | Status: AC
  Administered 2016-11-03: 200 mg via ORAL

## 2016-11-03 MED ORDER — ONDANSETRON HCL 4 MG/2ML IJ SOLN
INTRAMUSCULAR | Status: AC
  Filled 2016-11-03: qty 2

## 2016-11-03 MED ORDER — METOPROLOL TARTRATE 50 MG PO TABS
100.0000 mg | ORAL_TABLET | Freq: Every evening | ORAL | Status: DC
  Administered 2016-11-03 – 2016-11-05 (×2): 100 mg via ORAL
  Filled 2016-11-03 (×3): qty 2

## 2016-11-03 MED ORDER — ACETAMINOPHEN 10 MG/ML IV SOLN
INTRAVENOUS | Status: AC
  Filled 2016-11-03: qty 100

## 2016-11-03 MED ORDER — LOSARTAN POTASSIUM 50 MG PO TABS
100.0000 mg | ORAL_TABLET | Freq: Every day | ORAL | Status: DC
  Administered 2016-11-03 – 2016-11-06 (×4): 100 mg via ORAL
  Filled 2016-11-03 (×4): qty 2

## 2016-11-03 MED ORDER — PROPOFOL 10 MG/ML IV BOLUS
INTRAVENOUS | Status: DC | PRN
  Administered 2016-11-03: 150 mg via INTRAVENOUS

## 2016-11-03 MED ORDER — SUCCINYLCHOLINE CHLORIDE 20 MG/ML IJ SOLN
INTRAMUSCULAR | Status: AC
  Filled 2016-11-03: qty 1

## 2016-11-03 MED ORDER — PHENYLEPHRINE HCL 10 MG/ML IJ SOLN
INTRAMUSCULAR | Status: DC | PRN
  Administered 2016-11-03: 50 ug via INTRAVENOUS

## 2016-11-03 MED ORDER — OXYCODONE HCL 5 MG/5ML PO SOLN: 5.0000 mg | Freq: Once | ORAL | Status: DC | PRN

## 2016-11-03 MED ORDER — GABAPENTIN 300 MG PO CAPS
ORAL_CAPSULE | ORAL | Status: AC
  Administered 2016-11-03: 300 mg via ORAL
  Filled 2016-11-03: qty 1

## 2016-11-03 MED ORDER — LIDOCAINE HCL (PF) 2 % IJ SOLN
INTRAMUSCULAR | Status: AC
  Filled 2016-11-03: qty 4

## 2016-11-03 MED ORDER — EPHEDRINE SULFATE 50 MG/ML IJ SOLN
INTRAMUSCULAR | Status: AC
  Filled 2016-11-03: qty 1

## 2016-11-03 MED ORDER — ACETAMINOPHEN 500 MG PO TABS
ORAL_TABLET | ORAL | Status: AC
  Administered 2016-11-03: 1000 mg via ORAL
  Filled 2016-11-03: qty 2

## 2016-11-03 MED ORDER — HEPARIN SODIUM (PORCINE) 5000 UNIT/ML IJ SOLN
5000.0000 [IU] | Freq: Three times a day (TID) | INTRAMUSCULAR | Status: DC
  Administered 2016-11-03 – 2016-11-06 (×8): 5000 [IU] via SUBCUTANEOUS
  Filled 2016-11-03 (×9): qty 1

## 2016-11-03 MED ORDER — GABAPENTIN 300 MG PO CAPS
300.0000 mg | ORAL_CAPSULE | ORAL | Status: AC
  Administered 2016-11-03: 300 mg via ORAL

## 2016-11-03 MED ORDER — ONDANSETRON HCL 4 MG/2ML IJ SOLN
INTRAMUSCULAR | Status: DC | PRN
  Administered 2016-11-03: 4 mg via INTRAVENOUS

## 2016-11-03 MED ORDER — FENTANYL CITRATE (PF) 100 MCG/2ML IJ SOLN
INTRAMUSCULAR | Status: AC
  Administered 2016-11-03: 25 ug via INTRAVENOUS
  Filled 2016-11-03: qty 2

## 2016-11-03 MED ORDER — HEPARIN SODIUM (PORCINE) 5000 UNIT/ML IJ SOLN
INTRAMUSCULAR | Status: AC
  Administered 2016-11-03: 5000 [IU] via SUBCUTANEOUS
  Filled 2016-11-03: qty 1

## 2016-11-03 MED ORDER — FENTANYL CITRATE (PF) 100 MCG/2ML IJ SOLN
25.0000 ug | INTRAMUSCULAR | Status: AC | PRN
  Administered 2016-11-03 (×6): 25 ug via INTRAVENOUS

## 2016-11-03 MED ORDER — KCL IN DEXTROSE-NACL 20-5-0.45 MEQ/L-%-% IV SOLN
INTRAVENOUS | Status: DC
  Administered 2016-11-03: 18:00:00 via INTRAVENOUS
  Administered 2016-11-04: 1000 mL via INTRAVENOUS
  Administered 2016-11-05: 01:00:00 via INTRAVENOUS
  Administered 2016-11-05: 1000 mL via INTRAVENOUS
  Filled 2016-11-03 (×7): qty 1000

## 2016-11-03 MED ORDER — HYDRALAZINE HCL 20 MG/ML IJ SOLN: 10.0000 mg | INTRAMUSCULAR | Status: DC | PRN

## 2016-11-03 MED ORDER — CEFAZOLIN SODIUM-DEXTROSE 2-4 GM/100ML-% IV SOLN
INTRAVENOUS | Status: AC
  Filled 2016-11-03: qty 100

## 2016-11-03 MED ORDER — FENTANYL CITRATE (PF) 100 MCG/2ML IJ SOLN
INTRAMUSCULAR | Status: DC | PRN
Start: 2016-11-03 — End: 2016-11-03
  Administered 2016-11-03: 100 ug via INTRAVENOUS
  Administered 2016-11-03: 50 ug via INTRAVENOUS
  Administered 2016-11-03 (×2): 25 ug via INTRAVENOUS

## 2016-11-03 MED ORDER — PROPOFOL 10 MG/ML IV BOLUS
INTRAVENOUS | Status: AC
  Filled 2016-11-03: qty 20

## 2016-11-03 MED ORDER — DEXAMETHASONE SODIUM PHOSPHATE 10 MG/ML IJ SOLN
INTRAMUSCULAR | Status: AC
  Filled 2016-11-03: qty 1

## 2016-11-03 MED ORDER — SUCCINYLCHOLINE CHLORIDE 20 MG/ML IJ SOLN
INTRAMUSCULAR | Status: DC | PRN
  Administered 2016-11-03: 100 mg via INTRAVENOUS

## 2016-11-03 MED ORDER — MORPHINE SULFATE (PF) 2 MG/ML IV SOLN: 1.0000 mg | INTRAVENOUS | Status: DC | PRN

## 2016-11-03 MED ORDER — FLUTICASONE PROPIONATE 50 MCG/ACT NA SUSP
1.0000 | Freq: Every day | NASAL | Status: DC | PRN
  Filled 2016-11-03: qty 16

## 2016-11-03 MED ORDER — PANTOPRAZOLE SODIUM 40 MG IV SOLR
40.0000 mg | Freq: Every day | INTRAVENOUS | Status: DC
  Administered 2016-11-03 – 2016-11-05 (×3): 40 mg via INTRAVENOUS
  Filled 2016-11-03 (×3): qty 40

## 2016-11-03 MED ORDER — ROCURONIUM BROMIDE 50 MG/5ML IV SOLN
INTRAVENOUS | Status: AC
  Filled 2016-11-03: qty 1

## 2016-11-03 SURGICAL SUPPLY — 57 items
ADH SKN CLS APL DERMABOND .7 (GAUZE/BANDAGES/DRESSINGS) ×1
APPLIER CLIP ROT 10 11.4 M/L (STAPLE) ×3
APR CLP MED LRG 11.4X10 (STAPLE) ×1
BAG SPEC RTRVL LRG 6X4 10 (ENDOMECHANICALS) ×1
BLADE SURG 15 STRL LF DISP TIS (BLADE) IMPLANT
BLADE SURG 15 STRL SS (BLADE) ×3
BULB RESERV EVAC DRAIN JP 100C (MISCELLANEOUS) ×2 IMPLANT
CANISTER SUCT 1200ML W/VALVE (MISCELLANEOUS) ×3 IMPLANT
CATH FOLEY 2WAY  5CC 20FR SIL (CATHETERS) ×2
CATH FOLEY 2WAY 5CC 20FR SIL (CATHETERS) IMPLANT
CATH REDDICK CHOLANGI 4FR 50CM (CATHETERS) ×3 IMPLANT
CHLORAPREP W/TINT 26ML (MISCELLANEOUS) ×3 IMPLANT
CLIP APPLIE ROT 10 11.4 M/L (STAPLE) ×1 IMPLANT
COVER MAYO STAND STRL (DRAPES) ×2 IMPLANT
DERMABOND ADVANCED (GAUZE/BANDAGES/DRESSINGS) ×2
DERMABOND ADVANCED .7 DNX12 (GAUZE/BANDAGES/DRESSINGS) ×1 IMPLANT
DEVICE SUTURE ENDOST 10MM (ENDOMECHANICALS) ×2 IMPLANT
DRAPE C-ARM XRAY 36X54 (DRAPES) ×3 IMPLANT
DRSG TEGADERM 6X8 (GAUZE/BANDAGES/DRESSINGS) ×2 IMPLANT
ELECT REM PT RETURN 9FT ADLT (ELECTROSURGICAL) ×3
ELECTRODE REM PT RTRN 9FT ADLT (ELECTROSURGICAL) ×1 IMPLANT
GLOVE BIO SURGEON STRL SZ8 (GLOVE) ×3 IMPLANT
GOWN STRL REUS W/ TWL LRG LVL3 (GOWN DISPOSABLE) ×2 IMPLANT
GOWN STRL REUS W/TWL 2XL LVL3 (GOWN DISPOSABLE) ×3 IMPLANT
GOWN STRL REUS W/TWL LRG LVL3 (GOWN DISPOSABLE) ×6
IRRIGATION STRYKERFLOW (MISCELLANEOUS) ×1 IMPLANT
IRRIGATOR STRYKERFLOW (MISCELLANEOUS) ×3
IV CATH ANGIO 12GX3 LT BLUE (NEEDLE) ×3 IMPLANT
IV NS 1000ML (IV SOLUTION) ×3
IV NS 1000ML BAXH (IV SOLUTION) ×1 IMPLANT
KIT PINK PAD W/HEAD ARE REST (MISCELLANEOUS) ×3
KIT PINK PAD W/HEAD ARM REST (MISCELLANEOUS) ×1 IMPLANT
KIT RM TURNOVER STRD PROC AR (KITS) ×3 IMPLANT
L-HOOK LAP DISP 36CM (ELECTROSURGICAL) ×3
LHOOK LAP DISP 36CM (ELECTROSURGICAL) ×1 IMPLANT
NEEDLE HYPO 22GX1.5 SAFETY (NEEDLE) ×3 IMPLANT
NS IRRIG 1000ML POUR BTL (IV SOLUTION) ×3 IMPLANT
NS IRRIG 500ML POUR BTL (IV SOLUTION) ×1 IMPLANT
PACK LAP CHOLECYSTECTOMY (MISCELLANEOUS) ×3 IMPLANT
PENCIL ELECTRO HAND CTR (MISCELLANEOUS) ×3 IMPLANT
POUCH SPECIMEN RETRIEVAL 10MM (ENDOMECHANICALS) ×3 IMPLANT
SCISSORS METZENBAUM CVD 33 (INSTRUMENTS) ×3 IMPLANT
SLEEVE ENDOPATH XCEL 5M (ENDOMECHANICALS) ×8 IMPLANT
STAPLER SKIN PROX 35W (STAPLE) ×2 IMPLANT
SUT ENDOSTITCH SURGIDAC 2-0 GR (SUTURE) ×3
SUT SILK 2 0 SH (SUTURE) ×4 IMPLANT
SUT VIC AB 4-0 SH 27 (SUTURE) ×3
SUT VIC AB 4-0 SH 27XANBCTRL (SUTURE) ×1 IMPLANT
SUT VICRYL 0 AB UR-6 (SUTURE) ×3 IMPLANT
SUTURE ENDSITCH SURGIDC 2-0 GR (SUTURE) IMPLANT
SYR 10ML LL (SYRINGE) ×2 IMPLANT
SYRINGE IRR TOOMEY STRL 70CC (SYRINGE) ×2 IMPLANT
TOWEL OR 17X26 4PK STRL BLUE (TOWEL DISPOSABLE) ×3 IMPLANT
TROCAR XCEL BLUNT TIP 100MML (ENDOMECHANICALS) ×3 IMPLANT
TROCAR XCEL NON-BLD 11X100MML (ENDOMECHANICALS) ×3 IMPLANT
TROCAR XCEL NON-BLD 5MMX100MML (ENDOMECHANICALS) ×3 IMPLANT
TUBING INSUFFLATOR HI FLOW (MISCELLANEOUS) ×3 IMPLANT

## 2016-11-03 NOTE — Op Note (Signed)
Absalom Aro Pilat   11/03/2016  2:11 PM  Procedure: Laparoscopic cholecystostomy   Surgeon: Catalina Antigua B. 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  The patient was prepped with chlorohexidine and draped sterilely. A time out was performed.  Access to the abdomen was achieved with a 5 mm Optiview through the left upper quadrant.  The patient had a prior history of a Crohn's resection through a right paramedian incision and I was anticipating significant adhesions to the anterior abdominal wall. Howeversuch adhesions were seen.   Port placement included a 5 mm below the umbilicus, 2 fives on the right side and essentially upsized to a 11 mm slightly to the left of the midline..    The gallbladder was completely walled off and not readily visible. Numerous adhesions were taken down and medially it was stuck and it appeared initially we thought this might be the duodenum but it was the remnant of his colon that was fused to the medial wall of the gallbladder. We recognize the tenia and backed off on further dissection at that point. The gallbladder itself was quite chronically inflamed and very stuck.. I elected to put in a 56 French Foley through a separate incision overlying the fundus of the gallbladder. I created cholecystostomy and inserted the Foley and secured it with a pursestring suture of 2-0 Surgidek using the Ryder System. 10 mL of saline was placed in the balloon and it was secured to the skin after checking its position with the lower by pressure in the abdomen with a 2-0 silk's 2.  A Blake drain was brought out through the lateral 41 Mediport and placed in the intrahepatic position below the gallbladder. Then irrigated the cholecystostomy tube and got a  lot of cloudy brown turbid material. This was placed into the garbage container which the nurses described as feculent area this leads me to believe that the area below is a: Cholecysto colonic fistula. Drains were secured with 2-0 silk's and these the skin wounds were stapled to allow drainage as needed. The patient was taken to the recovery room in satisfactory condition.

## 2016-11-03 NOTE — Anesthesia Preprocedure Evaluation (Signed)
Anesthesia Evaluation  Patient identified by MRN, date of birth, ID band Patient awake    Reviewed: Allergy & Precautions, H&P , NPO status , Patient's Chart, lab work & pertinent test results  History of Anesthesia Complications (+) DIFFICULT AIRWAY and history of anesthetic complications  Airway Mallampati: III  TM Distance: <3 FB Neck ROM: limited    Dental  (+) Poor Dentition, Chipped   Pulmonary neg shortness of breath, sleep apnea and Continuous Positive Airway Pressure Ventilation ,           Cardiovascular Exercise Tolerance: Good hypertension,      Neuro/Psych negative neurological ROS  negative psych ROS   GI/Hepatic Neg liver ROS, GERD  Medicated and Controlled,  Endo/Other  negative endocrine ROS  Renal/GU      Musculoskeletal   Abdominal   Peds  Hematology negative hematology ROS (+)   Anesthesia Other Findings Past Medical History: No date: Arthritis No date: Chronic lower back pain No date: Crohn's disease (H[facility]) No date: GERD (gastroesophageal reflux disease) No date: Gout 40 years ago: History of kidney stones No date: Hypercholesteremia No date: Hypertension No date: Pre-diabetes No date: Prediabetes  Past Surgical History: 1973: COLECTOMY     Comment:  "part of large intestines" (05/04/2012) No date: COLON RESECTION No date: COLONOSCOPY 05/23/2014: COLONOSCOPY; N/A     Comment:  Procedure: COLONOSCOPY;  Surgeon: Rogene Houston, MD;                [facility];  Service: Endoscopy;                Laterality: N/A;  1200 05/23/2014: ESOPHAGEAL DILATION; N/A     Comment:  Procedure: ESOPHAGEAL DILATION;  Surgeon: Rogene Houston, MD;  [facility];  Service:               Endoscopy;  Laterality: N/A; 03/16/2014: ESOPHAGOGASTRODUODENOSCOPY; N/A     Comment:  Procedure: esophageal dilation ;  Surgeon: Rogene Houston, MD;  [facility];  Service:               Endoscopy;  Laterality: N/A; 05/23/2014: ESOPHAGOGASTRODUODENOSCOPY; N/A     Comment:  Procedure: ESOPHAGOGASTRODUODENOSCOPY (EGD);  Surgeon:               Rogene Houston, MD;  [facility];  Service:               Endoscopy;  Laterality: N/A; 1959: HEMORRHOID SURGERY 1970's: KIDNEY STONE SURGERY ~ 40 years ago: PARTIAL COLECTOMY     Comment:  some of large and small No date: PROSTATE SURGERY 05/04/2012: THYROIDECTOMY; Right     Comment:  Procedure: RIGHT HEMI THYROIDECTOMY;  Surgeon: Ascencion Dike, MD;  Location: [place];  Service: ENT;  Laterality:               Right; No date: THYROIDECTOMY No date: TONSILLECTOMY AND ADENOIDECTOMY     Comment:  as a child 07/11/2012: TRANSURETHRAL PROSTATECTOMY WITH GYRUS INSTRUMENTS; N/A     Comment:  Procedure: TRANSURETHRAL PROSTATECTOMY WITH GYRUS               INSTRUMENTS;  Surgeon: Claybon Jabs, MD;  Location: [facility];  Service: Urology;  Laterality: N/A; No date: UPPER GASTROINTESTINAL ENDOSCOPY     Reproductive/Obstetrics negative OB ROS                             Anesthesia Physical Anesthesia Plan  ASA: III  Anesthesia Plan: General ETT   Post-op Pain Management:    Induction: Intravenous  PONV Risk Score and Plan:   Airway Management Planned: Oral ETT  Additional Equipment:   Intra-op Plan:   Post-operative Plan: Extubation in OR  Informed Consent: I have reviewed the patients History and Physical, chart, labs and discussed the procedure including the risks, benefits and alternatives for the proposed anesthesia with the patient or authorized representative who has indicated his/her understanding and acceptance.   Dental Advisory Given  Plan Discussed with: Anesthesiologist, CRNA and Surgeon  Anesthesia Plan Comments: (Patient consented for risks of anesthesia including but not limited to:  - adverse reactions to medications -  damage to teeth, lips or other oral mucosa - sore throat or hoarseness - Damage to heart, brain, lungs or loss of life  Patient voiced understanding.)        Anesthesia Quick Evaluation

## 2016-11-03 NOTE — Anesthesia Post-op Follow-up Note (Signed)
Anesthesia QCDR form completed.        

## 2016-11-03 NOTE — Anesthesia Procedure Notes (Signed)
Procedure Name: Intubation Date/Time: 11/03/2016 12:47 PM Performed by: Hedda Slade Pre-anesthesia Checklist: Patient identified, Patient being monitored, Timeout performed, Emergency Drugs available and Suction available Patient Re-evaluated:Patient Re-evaluated prior to induction Oxygen Delivery Method: Circle system utilized Preoxygenation: Pre-oxygenation with 100% oxygen Induction Type: IV induction Ventilation: Mask ventilation without difficulty Laryngoscope Size: 4 and McGraph Grade View: Grade I Tube type: Oral Tube size: 7.5 mm Number of attempts: 1 Airway Equipment and Method: Stylet and Video-laryngoscopy Placement Confirmation: ETT inserted through vocal cords under direct vision,  positive ETCO2 and breath sounds checked- equal and bilateral Secured at: 22 cm Tube secured with: Tape Dental Injury: Teeth and Oropharynx as per pre-operative assessment  Difficulty Due To: Difficulty was anticipated and Difficult Airway- due to anterior larynx

## 2016-11-03 NOTE — Transfer of Care (Signed)
Immediate Anesthesia Transfer of Care Note  Patient: Thomas David  Procedure(s) Performed: LAPAROSCOPIC CHOLECYSTostomy with tube placement  (N/A )  Patient Location: PACU  Anesthesia Type:General  Level of Consciousness: awake, alert  and oriented  Airway & Oxygen Therapy: Patient Spontanous Breathing and Patient connected to face mask oxygen  Post-op Assessment: Report given to RN and Post -op Vital signs reviewed and stable  Post vital signs: Reviewed and stable  Last Vitals:  Vitals:   11/03/16 1427  BP: (!) 151/75  Pulse: 64  Resp: 14  Temp: 36.6 C  SpO2: 100%    Last Pain: There were no vitals filed for this visit.       Complications: No apparent anesthesia complications

## 2016-11-03 NOTE — Interval H&P Note (Signed)
History and Physical Interval Note:  11/03/2016 12:21 PM  Thomas David  has presented today for surgery, with the diagnosis of gallstones  The various methods of treatment have been discussed with the patient and family. After consideration of risks, benefits and other options for treatment, the patient has consented to  Procedure(s): LAPAROSCOPIC CHOLECYSTECTOMY WITH INTRAOPERATIVE CHOLANGIOGRAM (N/A) as a surgical intervention .  The patient's history has been reviewed, patient examined, no change in status, stable for surgery.  I have reviewed the patient's chart and labs.  Questions were answered to the patient's satisfaction.     Nivedita Mirabella B

## 2016-11-04 ENCOUNTER — Encounter
Admission: RE | Admit: 2016-11-04 | Discharge: 2016-11-04 | Disposition: A | Discharge status: Home / Self Care | Payer: Medicare Other | Source: Ambulatory Visit | Attending: Internal Medicine | Admitting: Internal Medicine

## 2016-11-04 ENCOUNTER — Encounter: Payer: Self-pay | Admitting: Surgery

## 2016-11-04 ENCOUNTER — Telehealth (INDEPENDENT_AMBULATORY_CARE_PROVIDER_SITE_OTHER): Payer: Self-pay | Admitting: Internal Medicine

## 2016-11-04 LAB — GLUCOSE, CAPILLARY
Glucose-Capillary: 123 mg/dL — ABNORMAL HIGH (ref 65–99)
Glucose-Capillary: 182 mg/dL — ABNORMAL HIGH (ref 65–99)
Glucose-Capillary: 215 mg/dL — ABNORMAL HIGH (ref 65–99)

## 2016-11-04 LAB — CBC
HCT: 33.7 % — ABNORMAL LOW (ref 40.0–52.0)
Hemoglobin: 11.1 g/dL — ABNORMAL LOW (ref 13.0–18.0)
MCH: 27 pg (ref 26.0–34.0)
MCHC: 32.9 g/dL (ref 32.0–36.0)
MCV: 82 fL (ref 80.0–100.0)
Platelets: 289 10*3/uL (ref 150–440)
RBC: 4.11 MIL/uL — ABNORMAL LOW (ref 4.40–5.90)
RDW: 18.3 % — ABNORMAL HIGH (ref 11.5–14.5)
WBC: 14.5 10*3/uL — ABNORMAL HIGH (ref 3.8–10.6)

## 2016-11-04 LAB — COMPREHENSIVE METABOLIC PANEL WITH GFR
ALT: 15 U/L — ABNORMAL LOW (ref 17–63)
AST: 22 U/L (ref 15–41)
Albumin: 2.7 g/dL — ABNORMAL LOW (ref 3.5–5.0)
Alkaline Phosphatase: 129 U/L — ABNORMAL HIGH (ref 38–126)
Anion gap: 7 (ref 5–15)
BUN: 14 mg/dL (ref 6–20)
CO2: 25 mmol/L (ref 22–32)
Calcium: 8.8 mg/dL — ABNORMAL LOW (ref 8.9–10.3)
Chloride: 103 mmol/L (ref 101–111)
Creatinine, Ser: 0.97 mg/dL (ref 0.61–1.24)
GFR calc Af Amer: >60 mL/min
GFR calc non Af Amer: >60 mL/min
Glucose, Bld: 206 mg/dL — ABNORMAL HIGH (ref 65–99)
Potassium: 4.5 mmol/L (ref 3.5–5.1)
Sodium: 135 mmol/L (ref 135–145)
Total Bilirubin: 0.5 mg/dL (ref 0.3–1.2)
Total Protein: 7.6 g/dL (ref 6.5–8.1)

## 2016-11-04 MED ORDER — MESALAMINE ER 250 MG PO CPCR
500.0000 mg | ORAL_CAPSULE | Freq: Four times a day (QID) | ORAL | Status: DC
  Administered 2016-11-04 – 2016-11-06 (×7): 500 mg via ORAL
  Filled 2016-11-04 (×11): qty 2

## 2016-11-04 MED ORDER — INSULIN ASPART 100 UNIT/ML ~~LOC~~ SOLN
0.0000 [IU] | SUBCUTANEOUS | Status: DC
  Administered 2016-11-04: 2 [IU] via SUBCUTANEOUS
  Administered 2016-11-04: 5 [IU] via SUBCUTANEOUS
  Administered 2016-11-04: 3 [IU] via SUBCUTANEOUS
  Administered 2016-11-05 (×2): 2 [IU] via SUBCUTANEOUS
  Administered 2016-11-05: 3 [IU] via SUBCUTANEOUS
  Administered 2016-11-06 (×3): 2 [IU] via SUBCUTANEOUS
  Filled 2016-11-04 (×9): qty 1

## 2016-11-04 NOTE — Progress Notes (Addendum)
Inpatient Diabetes Program Recommendations  AACE/ADA: New Consensus Statement on Inpatient Glycemic Control (2015)  Target Ranges:  Prepandial:   less than 140 mg/dL      Peak postprandial:   less than 180 mg/dL (1-2 hours)      Critically ill patients:  140 - 180 mg/dL   Lab Results  Component Value Date   GLUCAP 148 (H) 07/12/2012   HGBA1C 6.9 09/14/2016    Review of Glycemic ControlResults for AZAAN, LEASK (MRN [id number]) as of 11/04/2016 11:33  Ref. Range 11/04/2016 04:24  Glucose Latest Ref Range: 65 - 99 mg/dL 206 (H)    Diabetes history: Pre-diabetes (A1C in August was 6.9%) Outpatient Diabetes medications: None Current orders for Inpatient glycemic control: None  Inpatient Diabetes Program Recommendations:    Please consider adding Novolog sensitive correction tid with meals and HS (per glycemic control order set).  Also patient will need follow-up with PCP regarding elevated A1C from August, 2018.   Thanks, Adah Perl, RN, BC-ADM Inpatient Diabetes Coordinator Pager [phone] (8a-5p)

## 2016-11-04 NOTE — Progress Notes (Signed)
Patient ID: Thomas David, male   DOB: 06/16/1935, 81 y.o.   MRN: [id number] [facility] Progress Note:   1 Day Post-Op  Subjective: Mental status is alert and oriented without complaints Objective: Vital signs in last 24 hours: Temp:  [97.3 F (36.3 C)-98.5 F (36.9 C)] 97.6 F (36.4 C) (10/03 1358) Pulse Rate:  [60-81] 81 (10/03 1358) Resp:  [14-19] 16 (10/02 1800) BP: (118-165)/(58-78) 151/62 (10/03 1358) SpO2:  [94 %-100 %] 95 % (10/03 1358)  Intake/Output from previous day: 10/02 0701 - 10/03 0700 In: 2188.3 [P.O.:480; I.V.:1698.3] Out: 1525 [Urine:1325; Drains:185; Blood:15] Intake/Output this shift: Total I/O In: 660 [P.O.:660] Out: 800 [Urine:800]  Physical Exam: Work of breathing is normal.  Drainage from cholecystostomy tube cloudy  Lab Results:  Results for orders placed or performed during the hospital encounter of 11/03/16 (from the past 48 hour(s))  CBC     Status: Abnormal   Collection Time: 11/03/16  3:04 PM  Result Value Ref Range   WBC 11.0 (H) 3.8 - 10.6 K/uL   RBC 4.37 (L) 4.40 - 5.90 MIL/uL   Hemoglobin 11.7 (L) 13.0 - 18.0 g/dL   HCT 35.7 (L) 40.0 - 52.0 %   MCV 81.8 80.0 - 100.0 fL   MCH 26.8 26.0 - 34.0 pg   MCHC 32.8 32.0 - 36.0 g/dL   RDW 18.7 (H) 11.5 - 14.5 %   Platelets 285 150 - 440 K/uL  Creatinine, serum     Status: None   Collection Time: 11/03/16  3:04 PM  Result Value Ref Range   Creatinine, Ser 1.08 0.61 - 1.24 mg/dL   GFR calc non Af Amer >60 >60 mL/min   GFR calc Af Amer >60 >60 mL/min    Comment: (NOTE) The eGFR has been calculated using the CKD EPI equation. This calculation has not been validated in all clinical situations. eGFR's persistently <60 mL/min signify possible Chronic Kidney Disease.   CBC     Status: Abnormal   Collection Time: 11/04/16  4:24 AM  Result Value Ref Range   WBC 14.5 (H) 3.8 - 10.6 K/uL   RBC 4.11 (L) 4.40 - 5.90 MIL/uL   Hemoglobin 11.1 (L) 13.0 - 18.0 g/dL   HCT 33.7 (L) 40.0 -  52.0 %   MCV 82.0 80.0 - 100.0 fL   MCH 27.0 26.0 - 34.0 pg   MCHC 32.9 32.0 - 36.0 g/dL   RDW 18.3 (H) 11.5 - 14.5 %   Platelets 289 150 - 440 K/uL    Comment: COUNT MAY BE INACCURATE DUE TO FIBRIN CLUMPS.  Comprehensive metabolic panel     Status: Abnormal   Collection Time: 11/04/16  4:24 AM  Result Value Ref Range   Sodium 135 135 - 145 mmol/L   Potassium 4.5 3.5 - 5.1 mmol/L   Chloride 103 101 - 111 mmol/L   CO2 25 22 - 32 mmol/L   Glucose, Bld 206 (H) 65 - 99 mg/dL   BUN 14 6 - 20 mg/dL   Creatinine, Ser 0.97 0.61 - 1.24 mg/dL   Calcium 8.8 (L) 8.9 - 10.3 mg/dL   Total Protein 7.6 6.5 - 8.1 g/dL   Albumin 2.7 (L) 3.5 - 5.0 g/dL   AST 22 15 - 41 U/L   ALT 15 (L) 17 - 63 U/L   Alkaline Phosphatase 129 (H) 38 - 126 U/L   Total Bilirubin 0.5 0.3 - 1.2 mg/dL   GFR calc non Af Amer >60 >60 mL/min  GFR calc Af Amer >60 >60 mL/min    Comment: (NOTE) The eGFR has been calculated using the CKD EPI equation. This calculation has not been validated in all clinical situations. eGFR's persistently <60 mL/min signify possible Chronic Kidney Disease.    Anion gap 7 5 - 15    Radiology/Results: No results found.  Anti-infectives: Anti-infectives    Start     Dose/Rate Route Frequency Ordered Stop   11/03/16 1715  piperacillin-tazobactam (ZOSYN) IVPB 3.375 g     3.375 g 12.5 mL/hr over 240 Minutes Intravenous Every 8 hours 11/03/16 1702     11/03/16 1140  ceFAZolin (ANCEF) 2-4 GM/100ML-% IVPB    Comments:  [name]   : cabinet override      11/03/16 1140 11/03/16 1249   11/03/16 0600  ceFAZolin (ANCEF) IVPB 2g/100 mL premix     2 g 200 mL/hr over 30 Minutes Intravenous On call to O.R. 11/02/16 2215 11/03/16 1304      Assessment/Plan: Problem List: Patient Active Problem List   Diagnosis Date Noted  . Fistula of gallbladder 11/03/2016  . Proteinuria 01/06/2016  . Obstructive sleep apnea 10/04/2013  . Prediabetes 08/24/2012  . Pernicious anemia 08/24/2012  . BPH  (benign prostatic hyperplasia) 05/26/2012  . Crohn's disease of small and large intestines ([place]) 06/23/2011  . Gout 06/23/2011  . Essential hypertension 06/23/2011  . Hyperlipemia 06/23/2011  . GERD (gastroesophageal reflux disease) 06/23/2011    Appears stable.  I discussed the operative findings with his son from [place].  Will tx glucose intolerance and recheck CBC in am 1 Day Post-Op    LOS: 1 day   Matt B. Hassell Done, MD, [facility] [phone] beeper [phone]  11/04/2016 3:02 PM

## 2016-11-04 NOTE — Telephone Encounter (Signed)
Patient called, lmoam that he is in [facility] and that he would like Dr. Laural Golden to call him.  [phone]

## 2016-11-04 NOTE — Anesthesia Postprocedure Evaluation (Signed)
Anesthesia Post Note  Patient: Thomas David  Procedure(s) Performed: LAPAROSCOPIC CHOLECYSTostomy with tube placement  (N/A )  Patient location during evaluation: PACU Anesthesia Type: General Level of consciousness: awake and alert Pain management: pain level controlled Vital Signs Assessment: post-procedure vital signs reviewed and stable Respiratory status: spontaneous breathing, nonlabored ventilation, respiratory function stable and patient connected to nasal cannula oxygen Cardiovascular status: blood pressure returned to baseline and stable Postop Assessment: no apparent nausea or vomiting Anesthetic complications: no     Last Vitals:  Vitals:   11/04/16 0000 11/04/16 0500  BP: (!) 118/58 128/74  Pulse: 73 60  Resp:    Temp: 36.7 C 36.4 C  SpO2: 94% 97%    Last Pain:  Vitals:   11/04/16 0614  TempSrc:   PainSc: Asleep                 Precious Haws [place]

## 2016-11-04 NOTE — Telephone Encounter (Signed)
Talked with the patient. He has questions about the conversation between [place] and Dr.Martin on yesterday. He would appreciate a call from [place]. The number was given to [place] .

## 2016-11-05 LAB — CBC WITH DIFFERENTIAL/PLATELET
BASOS ABS: 0.1 10*3/uL (ref 0–0.1)
BASOS PCT: 0 %
EOS ABS: 0 10*3/uL (ref 0–0.7)
EOS PCT: 0 %
HEMATOCRIT: 33.9 % — AB (ref 40.0–52.0)
Hemoglobin: 11 g/dL — ABNORMAL LOW (ref 13.0–18.0)
Lymphocytes Relative: 13 %
Lymphs Abs: 1.7 10*3/uL (ref 1.0–3.6)
MCH: 26.4 pg (ref 26.0–34.0)
MCHC: 32.5 g/dL (ref 32.0–36.0)
MCV: 81.3 fL (ref 80.0–100.0)
MONO ABS: 1 10*3/uL (ref 0.2–1.0)
MONOS PCT: 8 %
NEUTROS ABS: 9.8 10*3/uL — AB (ref 1.4–6.5)
Neutrophils Relative %: 79 %
PLATELETS: 275 10*3/uL (ref 150–440)
RBC: 4.16 MIL/uL — ABNORMAL LOW (ref 4.40–5.90)
RDW: 19 % — AB (ref 11.5–14.5)
WBC: 12.6 10*3/uL — ABNORMAL HIGH (ref 3.8–10.6)

## 2016-11-05 LAB — BASIC METABOLIC PANEL
ANION GAP: 6 (ref 5–15)
BUN: 13 mg/dL (ref 6–20)
CALCIUM: 8.8 mg/dL — AB (ref 8.9–10.3)
CO2: 28 mmol/L (ref 22–32)
CREATININE: 0.93 mg/dL (ref 0.61–1.24)
Chloride: 105 mmol/L (ref 101–111)
GFR calc Af Amer: >60 mL/min (ref >60)
GLUCOSE: 140 mg/dL — AB (ref 65–99)
Potassium: 4.1 mmol/L (ref 3.5–5.1)
Sodium: 139 mmol/L (ref 135–145)

## 2016-11-05 LAB — GLUCOSE, CAPILLARY
GLUCOSE-CAPILLARY: 129 mg/dL — AB (ref 65–99)
GLUCOSE-CAPILLARY: 124 mg/dL — AB (ref 65–99)
GLUCOSE-CAPILLARY: 124 mg/dL — AB (ref 65–99)
Glucose-Capillary: 163 mg/dL — ABNORMAL HIGH (ref 65–99)
Glucose-Capillary: 117 mg/dL — ABNORMAL HIGH (ref 65–99)

## 2016-11-05 MED ORDER — AMOXICILLIN-POT CLAVULANATE 875-125 MG PO TABS: 1.0000 | ORAL_TABLET | Freq: Two times a day (BID) | ORAL | 1 refills | Status: DC

## 2016-11-05 MED ORDER — AMOXICILLIN-POT CLAVULANATE 875-125 MG PO TABS
1.0000 | ORAL_TABLET | Freq: Two times a day (BID) | ORAL | Status: DC
  Administered 2016-11-05 – 2016-11-06 (×2): 1 via ORAL
  Filled 2016-11-05 (×2): qty 1

## 2016-11-05 MED ORDER — HYDROCODONE-ACETAMINOPHEN 5-325 MG PO TABS: 1.0000 | ORAL_TABLET | ORAL | 0 refills | Status: DC | PRN

## 2016-11-05 MED ORDER — POLYETHYLENE GLYCOL 3350 17 G PO PACK
17.0000 g | PACK | Freq: Every day | ORAL | Status: DC
Start: 2016-11-05 — End: 2016-11-06
  Administered 2016-11-05 – 2016-11-06 (×2): 17 g via ORAL
  Filled 2016-11-05 (×2): qty 1

## 2016-11-05 MED ORDER — BISACODYL 10 MG RE SUPP
10.0000 mg | Freq: Once | RECTAL | Status: AC
  Administered 2016-11-05: 10 mg via RECTAL
  Filled 2016-11-05: qty 1

## 2016-11-05 NOTE — Telephone Encounter (Signed)
Talked with patient last evening. 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

## 2016-11-05 NOTE — Progress Notes (Signed)
Patient ID: Thomas David, male   DOB: April 30, 1935, 81 y.o.   MRN: [id number] [facility] Progress Note:   2 Days Post-Op  Subjective: Mental status is clear.  Seen in the evening.  [place] [facility].   Objective: Vital signs in last 24 hours: Temp:  [97.4 F (36.3 C)-98.1 F (36.7 C)] 97.9 F (36.6 C) (10/04 1334) Pulse Rate:  [54-68] 68 (10/04 1334) Resp:  [16-20] 16 (10/04 1334) BP: (119-145)/(60-67) 129/61 (10/04 1334) SpO2:  [94 %-96 %] 96 % (10/04 1334)  Intake/Output from previous day: 10/03 0701 - 10/04 0700 In: 3342 [P.O.:660; I.V.:2357; IV Piggyback:100] Out: 2028 [Urine:2025; Drains:3] Intake/Output this shift: Total I/O In: 1786 [P.O.:600; I.V.:961; Other:175; IV Piggyback:50] Out: 1125 [Urine:1125]  Physical Exam: Work of breathing is not labored.  Drains with cloudy drainage.  Serosanguinous drainage in JP  Lab Results:  Results for orders placed or performed during the hospital encounter of 11/03/16 (from the past 48 hour(s))  CBC     Status: Abnormal   Collection Time: 11/04/16  4:24 AM  Result Value Ref Range   WBC 14.5 (H) 3.8 - 10.6 K/uL   RBC 4.11 (L) 4.40 - 5.90 MIL/uL   Hemoglobin 11.1 (L) 13.0 - 18.0 g/dL   HCT 33.7 (L) 40.0 - 52.0 %   MCV 82.0 80.0 - 100.0 fL   MCH 27.0 26.0 - 34.0 pg   MCHC 32.9 32.0 - 36.0 g/dL   RDW 18.3 (H) 11.5 - 14.5 %   Platelets 289 150 - 440 K/uL    Comment: COUNT MAY BE INACCURATE DUE TO FIBRIN CLUMPS.  Comprehensive metabolic panel     Status: Abnormal   Collection Time: 11/04/16  4:24 AM  Result Value Ref Range   Sodium 135 135 - 145 mmol/L   Potassium 4.5 3.5 - 5.1 mmol/L   Chloride 103 101 - 111 mmol/L   CO2 25 22 - 32 mmol/L   Glucose, Bld 206 (H) 65 - 99 mg/dL   BUN 14 6 - 20 mg/dL   Creatinine, Ser 0.97 0.61 - 1.24 mg/dL   Calcium 8.8 (L) 8.9 - 10.3 mg/dL   Total Protein 7.6 6.5 - 8.1 g/dL   Albumin 2.7 (L) 3.5 - 5.0 g/dL   AST 22 15 - 41 U/L   ALT 15  (L) 17 - 63 U/L   Alkaline Phosphatase 129 (H) 38 - 126 U/L   Total Bilirubin 0.5 0.3 - 1.2 mg/dL   GFR calc non Af Amer >60 >60 mL/min   GFR calc Af Amer >60 >60 mL/min    Comment: (NOTE) The eGFR has been calculated using the CKD EPI equation. This calculation has not been validated in all clinical situations. eGFR's persistently <60 mL/min signify possible Chronic Kidney Disease.    Anion gap 7 5 - 15  Glucose, capillary     Status: Abnormal   Collection Time: 11/04/16  4:22 PM  Result Value Ref Range   Glucose-Capillary 215 (H) 65 - 99 mg/dL   Comment 1 Notify RN   Glucose, capillary     Status: Abnormal   Collection Time: 11/04/16  7:40 PM  Result Value Ref Range   Glucose-Capillary 182 (H) 65 - 99 mg/dL  Glucose, capillary     Status: Abnormal   Collection Time: 11/04/16 11:33 PM  Result Value Ref Range   Glucose-Capillary 123 (H) 65 - 99 mg/dL  Glucose, capillary     Status: Abnormal  Collection Time: 11/05/16  3:59 AM  Result Value Ref Range   Glucose-Capillary 129 (H) 65 - 99 mg/dL  Basic metabolic panel     Status: Abnormal   Collection Time: 11/05/16  4:32 AM  Result Value Ref Range   Sodium 139 135 - 145 mmol/L   Potassium 4.1 3.5 - 5.1 mmol/L   Chloride 105 101 - 111 mmol/L   CO2 28 22 - 32 mmol/L   Glucose, Bld 140 (H) 65 - 99 mg/dL   BUN 13 6 - 20 mg/dL   Creatinine, Ser 0.93 0.61 - 1.24 mg/dL   Calcium 8.8 (L) 8.9 - 10.3 mg/dL   GFR calc non Af Amer >60 >60 mL/min   GFR calc Af Amer >60 >60 mL/min    Comment: (NOTE) The eGFR has been calculated using the CKD EPI equation. This calculation has not been validated in all clinical situations. eGFR's persistently <60 mL/min signify possible Chronic Kidney Disease.    Anion gap 6 5 - 15  CBC with Differential/Platelet     Status: Abnormal   Collection Time: 11/05/16  4:32 AM  Result Value Ref Range   WBC 12.6 (H) 3.8 - 10.6 K/uL   RBC 4.16 (L) 4.40 - 5.90 MIL/uL   Hemoglobin 11.0 (L) 13.0 - 18.0 g/dL    HCT 33.9 (L) 40.0 - 52.0 %   MCV 81.3 80.0 - 100.0 fL   MCH 26.4 26.0 - 34.0 pg   MCHC 32.5 32.0 - 36.0 g/dL   RDW 19.0 (H) 11.5 - 14.5 %   Platelets 275 150 - 440 K/uL   Neutrophils Relative % 79 %   Neutro Abs 9.8 (H) 1.4 - 6.5 K/uL   Lymphocytes Relative 13 %   Lymphs Abs 1.7 1.0 - 3.6 K/uL   Monocytes Relative 8 %   Monocytes Absolute 1.0 0.2 - 1.0 K/uL   Eosinophils Relative 0 %   Eosinophils Absolute 0.0 0 - 0.7 K/uL   Basophils Relative 0 %   Basophils Absolute 0.1 0 - 0.1 K/uL  Glucose, capillary     Status: Abnormal   Collection Time: 11/05/16  7:47 AM  Result Value Ref Range   Glucose-Capillary 124 (H) 65 - 99 mg/dL   Comment 1 Notify RN   Glucose, capillary     Status: Abnormal   Collection Time: 11/05/16 11:37 AM  Result Value Ref Range   Glucose-Capillary 163 (H) 65 - 99 mg/dL   Comment 1 Notify RN   Glucose, capillary     Status: Abnormal   Collection Time: 11/05/16  4:32 PM  Result Value Ref Range   Glucose-Capillary 117 (H) 65 - 99 mg/dL    Radiology/Results: No results found.  Anti-infectives: Anti-infectives    Start     Dose/Rate Route Frequency Ordered Stop   11/05/16 2200  amoxicillin-clavulanate (AUGMENTIN) 875-125 MG per tablet 1 tablet     1 tablet Oral Every 12 hours 11/05/16 1831     11/05/16 0000  amoxicillin-clavulanate (AUGMENTIN) 875-125 MG tablet     1 tablet Oral Every 12 hours 11/05/16 1834     11/03/16 1715  piperacillin-tazobactam (ZOSYN) IVPB 3.375 g     3.375 g 12.5 mL/hr over 240 Minutes Intravenous Every 8 hours 11/03/16 1702     11/03/16 1140  ceFAZolin (ANCEF) 2-4 GM/100ML-% IVPB    Comments:  [name]   : cabinet override      11/03/16 1140 11/03/16 1249   11/03/16 0600  ceFAZolin (ANCEF) IVPB 2g/100  mL premix     2 g 200 mL/hr over 30 Minutes Intravenous On call to O.R. 11/02/16 2215 11/03/16 1304      Assessment/Plan: Problem List: Patient Active Problem List   Diagnosis Date Noted  . Fistula of gallbladder  11/03/2016  . Proteinuria 01/06/2016  . Obstructive sleep apnea 10/04/2013  . Prediabetes 08/24/2012  . Pernicious anemia 08/24/2012  . BPH (benign prostatic hyperplasia) 05/26/2012  . Crohn's disease of small and large intestines ([place]) 06/23/2011  . Gout 06/23/2011  . Essential hypertension 06/23/2011  . Hyperlipemia 06/23/2011  . GERD (gastroesophageal reflux disease) 06/23/2011    Will check CBC and CMET in AM.  Hopeful discharge in AM.   2 Days Post-Op    LOS: 2 days   Matt B. Hassell Done, MD, [facility] [phone] beeper [phone]  11/05/2016 6:36 PM

## 2016-11-06 DIAGNOSIS — M6281 Muscle weakness (generalized): Secondary | ICD-10-CM | POA: Diagnosis not present

## 2016-11-06 DIAGNOSIS — K823 Fistula of gallbladder: Secondary | ICD-10-CM | POA: Diagnosis not present

## 2016-11-06 DIAGNOSIS — N400 Enlarged prostate without lower urinary tract symptoms: Secondary | ICD-10-CM | POA: Diagnosis not present

## 2016-11-06 DIAGNOSIS — Z7982 Long term (current) use of aspirin: Secondary | ICD-10-CM | POA: Diagnosis not present

## 2016-11-06 DIAGNOSIS — K810 Acute cholecystitis: Secondary | ICD-10-CM | POA: Diagnosis not present

## 2016-11-06 DIAGNOSIS — K5090 Crohn's disease, unspecified, without complications: Secondary | ICD-10-CM | POA: Diagnosis not present

## 2016-11-06 DIAGNOSIS — R262 Difficulty in walking, not elsewhere classified: Secondary | ICD-10-CM | POA: Diagnosis not present

## 2016-11-06 DIAGNOSIS — E785 Hyperlipidemia, unspecified: Secondary | ICD-10-CM | POA: Diagnosis not present

## 2016-11-06 DIAGNOSIS — I10 Essential (primary) hypertension: Secondary | ICD-10-CM | POA: Diagnosis not present

## 2016-11-06 DIAGNOSIS — K219 Gastro-esophageal reflux disease without esophagitis: Secondary | ICD-10-CM | POA: Diagnosis not present

## 2016-11-06 DIAGNOSIS — Z48815 Encounter for surgical aftercare following surgery on the digestive system: Secondary | ICD-10-CM | POA: Diagnosis not present

## 2016-11-06 DIAGNOSIS — M109 Gout, unspecified: Secondary | ICD-10-CM | POA: Diagnosis not present

## 2016-11-06 LAB — CBC
HEMATOCRIT: 36.8 % — AB (ref 40.0–52.0)
Hemoglobin: 11.9 g/dL — ABNORMAL LOW (ref 13.0–18.0)
MCH: 26.9 pg (ref 26.0–34.0)
MCHC: 32.5 g/dL (ref 32.0–36.0)
MCV: 82.9 fL (ref 80.0–100.0)
PLATELETS: 291 10*3/uL (ref 150–440)
RBC: 4.43 MIL/uL (ref 4.40–5.90)
RDW: 19.2 % — AB (ref 11.5–14.5)
WBC: 13.8 10*3/uL — AB (ref 3.8–10.6)

## 2016-11-06 LAB — GLUCOSE, CAPILLARY
GLUCOSE-CAPILLARY: 123 mg/dL — AB (ref 65–99)
GLUCOSE-CAPILLARY: 135 mg/dL — AB (ref 65–99)
GLUCOSE-CAPILLARY: 194 mg/dL — AB (ref 65–99)
Glucose-Capillary: 122 mg/dL — ABNORMAL HIGH (ref 65–99)

## 2016-11-06 LAB — COMPREHENSIVE METABOLIC PANEL
ALBUMIN: 3 g/dL — AB (ref 3.5–5.0)
ALT: 14 U/L — ABNORMAL LOW (ref 17–63)
ANION GAP: 8 (ref 5–15)
AST: 15 U/L (ref 15–41)
Alkaline Phosphatase: 120 U/L (ref 38–126)
BILIRUBIN TOTAL: 0.5 mg/dL (ref 0.3–1.2)
BUN: 12 mg/dL (ref 6–20)
CHLORIDE: 101 mmol/L (ref 101–111)
CO2: 29 mmol/L (ref 22–32)
Calcium: 9.5 mg/dL (ref 8.9–10.3)
Creatinine, Ser: 1.01 mg/dL (ref 0.61–1.24)
GFR calc Af Amer: >60 mL/min (ref >60)
GFR calc non Af Amer: >60 mL/min (ref >60)
GLUCOSE: 126 mg/dL — AB (ref 65–99)
POTASSIUM: 4.8 mmol/L (ref 3.5–5.1)
SODIUM: 138 mmol/L (ref 135–145)
TOTAL PROTEIN: 7.8 g/dL (ref 6.5–8.1)

## 2016-11-06 MED ORDER — INSULIN ASPART 100 UNIT/ML ~~LOC~~ SOLN
0.0000 [IU] | Freq: Three times a day (TID) | SUBCUTANEOUS | Status: DC
  Administered 2016-11-06: 3 [IU] via SUBCUTANEOUS
  Filled 2016-11-06: qty 1

## 2016-11-06 NOTE — Progress Notes (Signed)
Dr Hassell Done called to inform me he would be in [place] at [facility] for most of the day but provided a contact number.  Orders received for met c and cbc

## 2016-11-06 NOTE — Progress Notes (Signed)
Report called earlier to Janeann Merl at [facility]

## 2016-11-06 NOTE — NC FL2 (Signed)
[place] LEVEL OF CARE SCREENING TOOL     IDENTIFICATION  Patient Name: Thomas David Birthdate: 1935/07/11 Sex: male Admission Date (Current Location): 11/03/2016  [place] and [place] Number:  Engineering geologist and Address:  [facility], [address], [place], [place] [address]      Provider Number: [id number]  Attending Physician Name and Address:  Johnathan Hausen, MD  Relative Name and Phone Number:       Current Level of Care: Hospital Recommended Level of Care: [place] Prior Approval Number:    Date Approved/Denied:   PASRR Number:    Discharge Plan: SNF    Current Diagnoses: Patient Active Problem List   Diagnosis Date Noted  . Fistula of gallbladder 11/03/2016  . Proteinuria 01/06/2016  . Obstructive sleep apnea 10/04/2013  . Prediabetes 08/24/2012  . Pernicious anemia 08/24/2012  . BPH (benign prostatic hyperplasia) 05/26/2012  . Crohn's disease of small and large intestines ([name]) 06/23/2011  . Gout 06/23/2011  . Essential hypertension 06/23/2011  . Hyperlipemia 06/23/2011  . GERD (gastroesophageal reflux disease) 06/23/2011    Orientation RESPIRATION BLADDER Height & Weight     Self, Time, Situation, Place  Normal Continent Weight: 220 lb (99.8 kg) Height:  5' 9"  (175.3 cm)  BEHAVIORAL SYMPTOMS/MOOD NEUROLOGICAL BOWEL NUTRITION STATUS   (none)  (none) Incontinent Diet (regular)  AMBULATORY STATUS COMMUNICATION OF NEEDS Skin   Supervision Verbally Surgical wounds (blake drain in the right upper quad)                       Personal Care Assistance Level of Assistance  Bathing, Dressing Bathing Assistance: Limited assistance   Dressing Assistance: Limited assistance     Functional Limitations Info   (none)          SPECIAL CARE FACTORS FREQUENCY                       Contractures Contractures Info: Not present    Additional Factors Info  Code Status, Allergies  Code Status Info: full Allergies Info: none           Current Medications (11/06/2016):  This is the current hospital active medication list Current Facility-Administered Medications  Medication Dose Route Frequency Provider Last Rate Last Dose  . amLODipine (NORVASC) tablet 5 mg  5 mg Oral Daily Johnathan Hausen, MD   5 mg at 11/06/16 1036  . amoxicillin-clavulanate (AUGMENTIN) 875-125 MG per tablet 1 tablet  1 tablet Oral Q12H Johnathan Hausen, MD   1 tablet at 11/06/16 1036  . dextrose 5 % and 0.45 % NaCl with KCl 20 mEq/L infusion   Intravenous Continuous Johnathan Hausen, MD 10 mL/hr at 11/05/16 1907    . fluticasone (FLONASE) 50 MCG/ACT nasal spray 1 spray  1 spray Each Nare Daily PRN Johnathan Hausen, MD      . heparin injection 5,000 Units  5,000 Units Subcutaneous Q8H Johnathan Hausen, MD   5,000 Units at 11/06/16 1219  . hydrALAZINE (APRESOLINE) injection 10 mg  10 mg Intravenous Q2H PRN Johnathan Hausen, MD      . HYDROcodone-acetaminophen (NORCO/VICODIN) 5-325 MG per tablet 1-2 tablet  1-2 tablet Oral Q4H PRN Johnathan Hausen, MD   2 tablet at 11/06/16 [phone]  . insulin aspart (novoLOG) injection 0-15 Units  0-15 Units Subcutaneous TID AC & HS Johnathan Hausen, MD   3 Units at 11/06/16 1206  . losartan (COZAAR) tablet 100 mg  100 mg Oral Daily Johnathan Hausen, MD   100 mg at 11/06/16 1036  . mesalamine (PENTASA) CR capsule 500 mg  500 mg Oral QID Johnathan Hausen, MD   500 mg at 11/06/16 1036  . metoprolol tartrate (LOPRESSOR) tablet 100 mg  100 mg Oral QPM Johnathan Hausen, MD   100 mg at 11/05/16 1745  . morphine 2 MG/ML injection 1 mg  1 mg Intravenous Q1H PRN Johnathan Hausen, MD      . ondansetron (ZOFRAN-ODT) disintegrating tablet 4 mg  4 mg Oral Q6H PRN Johnathan Hausen, MD       Or  . ondansetron [facility]) injection 4 mg  4 mg Intravenous Q6H PRN Johnathan Hausen, MD      . pantoprazole (PROTONIX) injection 40 mg  40 mg Intravenous Candy Sledge, MD   40 mg at 11/05/16 2035  .  polyethylene glycol (MIRALAX / GLYCOLAX) packet 17 g  17 g Oral Daily Johnathan Hausen, MD   17 g at 11/06/16 1036     Discharge Medications: Please see discharge summary for a list of discharge medications.  Relevant Imaging Results:  Relevant Lab Results:   Additional Information    Shela Leff, LCSW

## 2016-11-06 NOTE — Progress Notes (Signed)
Discharge instructions reviewed with the patient and family.  Patient has the packet for [facility] place along with a rx for norco.  Sent out via wheelchair to car

## 2016-11-06 NOTE — Discharge Summary (Signed)
Physician Discharge Summary  Patient ID: Thomas David MRN: [id number] DOB/AGE: 1935/06/26 81 y.o.  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  Kept in hospital for observation and IV antibiotics for 2 days then ready for discharge.  Consults: none  Significant Diagnostic Studies: none    Discharge Exam: Blood pressure 131/61, pulse 78, temperature 98 F (36.7 C), temperature source Oral, resp. rate 16, height 5' 9"  (1.753 m), weight 99.8 kg (220 lb), SpO2 96 %. Incisions and drains in place  Disposition: 01-Home or Self Care  Discharge Instructions    Diet - low sodium heart healthy    Complete by:  As directed    Discharge instructions    Complete by:  As directed    Keep cholecystostomy to bag drainage JP-recharge as needed and note character of the drainage and amount   Discharge wound care:    Complete by:  As directed    May change dressings around tubes as necessary.  Keep cholecystostomy from tethering or being pulled on excessively   Increase activity slowly    Complete by:  As directed      Allergies as of 11/06/2016   No Known Allergies     Medication List    TAKE these medications   allopurinol 300 MG tablet Commonly known as:  ZYLOPRIM Take 1 tablet (300 mg total) by mouth every morning.   amLODipine 5 MG tablet Commonly known as:  NORVASC Take 1 tablet (5 mg total) by mouth daily.   amoxicillin-clavulanate 875-125 MG tablet Commonly known as:  AUGMENTIN Take 1 tablet by mouth every 12 (twelve) hours.   aspirin EC 81 MG tablet Take 81 mg by mouth at bedtime.   budesonide 3 MG 24 hr  capsule Commonly known as:  ENTOCORT EC Take 1 capsule (3 mg total) by mouth daily.   ferrous sulfate 325 (65 FE) MG tablet Take 1 tablet (325 mg total) by mouth 2 (two) times daily.   fluticasone 50 MCG/ACT nasal spray Commonly known as:  FLONASE USE 1 SPRAY INTO BOTH  NOSTRILS DAILY. What changed:  how much to take  how to take this  when to take this  reasons to take this  additional instructions   HYDROcodone-acetaminophen 5-325 MG tablet Commonly known as:  NORCO/VICODIN Take 1-2 tablets by mouth every 4 (four) hours as needed for moderate pain.   losartan 100 MG tablet Commonly known as:  COZAAR Take 1 tablet (100 mg total) by mouth daily.   mesalamine 500 MG CR capsule Commonly known as:  PENTASA Take 1 capsule (500 mg total) by mouth 4 (four) times daily. What changed:  how much to take  when to take this   metoprolol tartrate 50 MG tablet Commonly known as:  LOPRESSOR Take 1 tablet (50 mg total) by mouth 2 (two) times daily. What changed:  how much to take  when to take this  additional instructions   multivitamin with minerals Tabs tablet Take 2 tablets by mouth daily.   omeprazole 20 MG capsule Commonly known as:  PRILOSEC TAKE 1 CAPSULE BY MOUTH  DAILY   OVER THE COUNTER MEDICATION Take 30 mLs by mouth at bedtime. Takes 2-3 times week Shea Evans  Juice.   oxybutynin 5 MG 24 hr tablet Commonly known as:  DITROPAN-XL TAKE 1 TABLET BY MOUTH AT  BEDTIME   PHILLIPS COLON HEALTH PO Take 1 tablet by mouth daily with supper.   simvastatin 20 MG tablet Commonly known as:  ZOCOR Take 1 tablet (20 mg total) by mouth daily.   vitamin B-12 1000 MCG tablet Commonly known as:  CYANOCOBALAMIN Take 1,000 mcg by mouth 2 (two) times daily.            Discharge Care Instructions        Start     Ordered   11/06/16 0000  Discharge wound care:    Comments:  May change dressings around tubes as necessary.  Keep cholecystostomy from tethering or  being pulled on excessively   11/06/16 1217     Follow-up Information    Johnathan Hausen, MD Follow up.   Specialty:  General Surgery Contact information: [place] [address] [phone]           Signed: Pedro Earls 11/06/2016, 12:19 PM

## 2016-11-06 NOTE — Clinical Social Work Note (Signed)
Clinical Social Work Assessment  Patient Details  Name: Thomas David MRN: [id number] Date of Birth: 1935-05-07  Date of referral:  11/06/16               Reason for consult:  Facility Placement                Permission sought to share information with:    Permission granted to share information::     Name::        Agency::     Relationship::     Contact Information:     Housing/Transportation Living arrangements for the past 2 months:  [place] of Information:  Facility Patient Interpreter Needed:  None Criminal Activity/Legal Involvement Pertinent to Current Situation/Hospitalization:  No - Comment as needed Significant Relationships:  None Lives with:  Facility Resident Do you feel safe going back to the place where you live?  Yes Need for family participation in patient care:  No (Coment)  Care giving concerns:  Patient resides in independent living at [facility].   Social Worker assessment / plan:  CSW informed today that patient is from independent living and [facility] states that patient is going to come to their skilled side. Patient's daughter is going to transport. Discharge information sent to [facility]. Patient and daughter in agreement.  Employment status:  Retired Nurse, adult PT Recommendations:  Not assessed at this time Information / Referral to community resources:     Patient/Family's Response to care:  Patient and daughter in agreement with going to skilled.  Patient/Family's Understanding of and Emotional Response to Diagnosis, Current Treatment, and Prognosis:  Patient is happy to discharge today.  Emotional Assessment Appearance:    Attitude/Demeanor/Rapport:    Affect (typically observed):    Orientation:    Alcohol / Substance use:    Psych involvement (Current and /or in the community):     Discharge Needs  Concerns to be addressed:    Readmission within the last 30  days:  No Current discharge risk:  None Barriers to Discharge:  No Barriers Identified   Shela Leff, LCSW 11/06/2016, 12:54 PM

## 2016-11-06 NOTE — Clinical Social Work Placement (Signed)
   CLINICAL SOCIAL WORK PLACEMENT  NOTE  Date:  11/06/2016  Patient Details  Name: Thomas David MRN: [id number] Date of Birth: 03-Oct-1935  Clinical Social Work is seeking post-discharge placement for this patient at the [place] level of care (*CSW will initial, date and re-position this form in  chart as items are completed):   (NA)   Patient/family provided with [place] Work Department's list of facilities offering this level of care within the geographic area requested by the patient (or if unable, by the patient's family).   (NA)   Patient/family informed of their freedom to choose among providers that offer the needed level of care, that participate in Medicare, Medicaid or managed care program needed by the patient, have an available bed and are willing to accept the patient.      Patient/family informed of [place]'s ownership interest in [facility] and [facility], as well as of the fact that they are under no obligation to receive care at these facilities.  PASRR submitted to EDS on 11/06/16     PASRR number received on 11/06/16     Existing PASRR number confirmed on       FL2 transmitted to all facilities in geographic area requested by pt/family on 11/06/16     FL2 transmitted to all facilities within larger geographic area on       Patient informed that his/her managed care company has contracts with or will negotiate with certain facilities, including the following:         (NA)   Patient/family informed of bed offers received.  Patient chooses bed at  [facility])     Physician recommends and patient chooses bed at  [facility])    Patient to be transferred to  [facility]) on 11/06/16.  Patient to be transferred to facility by  (daughter)     Patient family notified on 11/06/16 of transfer.  Name of family member notified:   (daughter)     PHYSICIAN       Additional Comment:     _______________________________________________ Shela Leff, LCSW 11/06/2016, 12:58 PM

## 2016-11-06 NOTE — Care Management Important Message (Signed)
Important Message  Patient Details  Name: Thomas David MRN: [id number] Date of Birth: [birth year]   Medicare Important Message Given:  Yes    Beverly Sessions, RN 11/06/2016, 12:03 PM

## 2016-11-09 ENCOUNTER — Other Ambulatory Visit: Payer: Self-pay

## 2016-11-09 MED ORDER — HYDROCODONE-ACETAMINOPHEN 5-325 MG PO TABS: 1.0000 | ORAL_TABLET | ORAL | 0 refills | Status: DC | PRN

## 2016-11-09 NOTE — Telephone Encounter (Signed)
Rx sent to [facility] phone : [phone] , fax : [phone]

## 2016-11-10 ENCOUNTER — Other Ambulatory Visit: Payer: Self-pay

## 2016-11-10 NOTE — Patient Outreach (Signed)
[facility]) Care Management  11/10/2016  Thomas David Sep 22, 1935 [id number]   EMMI: General Discharge Referral Date: 11/09/2016 Referral Source: EMMI Referral Reason: Who reached Caregiver. New prescriptions-" I don't know"  Day # 1   Outreach attempt # 1.   Spoke with daughter who states patient is in the [employer] at this time.  She acknowledges getting the phone calls and that she cannot answer the questions.  Advised her that patient is not appropriate for calls.  She verbalized understanding.      Plan: RN CM will notify care management assistant to end [place] calls and case closure.  Jone Baseman, RN, MSN [facility] Care Management Care Management Telephonic Coordinator Direct Line [phone] Toll Free: [phone]  Fax: [phone]

## 2016-11-11 ENCOUNTER — Ambulatory Visit: Payer: Medicare Other

## 2016-11-19 ENCOUNTER — Other Ambulatory Visit: Payer: Self-pay | Admitting: Surgery

## 2016-11-19 DIAGNOSIS — K8010 Calculus of gallbladder with chronic cholecystitis without obstruction: Secondary | ICD-10-CM

## 2016-11-25 ENCOUNTER — Ambulatory Visit: Admission: RE | Admit: 2016-11-25 | Payer: Medicare Other | Source: Ambulatory Visit

## 2016-11-26 ENCOUNTER — Ambulatory Visit
Admission: RE | Admit: 2016-11-26 | Discharge: 2016-11-26 | Disposition: A | Discharge status: Home / Self Care | Payer: Medicare Other | Source: Ambulatory Visit | Attending: Surgery | Admitting: Surgery

## 2016-11-26 DIAGNOSIS — Z434 Encounter for attention to other artificial openings of digestive tract: Secondary | ICD-10-CM | POA: Diagnosis not present

## 2016-11-26 DIAGNOSIS — Z4682 Encounter for fitting and adjustment of non-vascular catheter: Secondary | ICD-10-CM | POA: Insufficient documentation

## 2016-11-26 DIAGNOSIS — K8010 Calculus of gallbladder with chronic cholecystitis without obstruction: Secondary | ICD-10-CM

## 2016-11-26 HISTORY — PX: IR CHOLANGIOGRAM EXISTING TUBE: IMG6040

## 2016-11-26 MED ORDER — IOPAMIDOL (ISOVUE-300) INJECTION 61%
30.0000 mL | Freq: Once | INTRAVENOUS | Status: AC | PRN
  Administered 2016-11-26: 16:00:00 20 mL

## 2016-11-26 MED ORDER — SODIUM CHLORIDE 0.9 % IJ SOLN
INTRAMUSCULAR | Status: AC
  Filled 2016-11-26: qty 50

## 2016-11-26 NOTE — OR Nursing (Signed)
No consent needed per Dr Kathlene Cote since procedure just a dye study

## 2016-12-23 DIAGNOSIS — G4733 Obstructive sleep apnea (adult) (pediatric): Secondary | ICD-10-CM | POA: Diagnosis not present

## 2016-12-30 ENCOUNTER — Telehealth (INDEPENDENT_AMBULATORY_CARE_PROVIDER_SITE_OTHER): Payer: Self-pay | Admitting: Internal Medicine

## 2016-12-30 NOTE — Telephone Encounter (Signed)
Forwarded to [place] for review.

## 2016-12-30 NOTE — Telephone Encounter (Signed)
Patient called, stated that he called Dr. Laural Golden last night and left him a message on his cell phone.  He stated that he saw Dr. Hassell Done today and he took a tube out.  He stated that it's been over two months and he wanted to make sure you see the results.  He sees use for the same thing that he did five months ago since it was not the gallbladder.  He will see Dr. Hassell Done again in 3 weeks.  He doesn't have much energy, going to the bathroom once a day and he is taking the Miralax twice a day, things aren't going good.   [phone]

## 2017-01-01 ENCOUNTER — Telehealth (INDEPENDENT_AMBULATORY_CARE_PROVIDER_SITE_OTHER): Payer: Self-pay | Admitting: Internal Medicine

## 2017-01-01 NOTE — Telephone Encounter (Signed)
Patient called, stated that he has not heard back from Dr. Laural Golden and that he wanted an appointment.  I did explain that he's in procedures today and that I do not have any availability with Dr. Laural Golden until June, but I would be happy to schedule him with Deberah Castle, NP, he refused.

## 2017-01-03 ENCOUNTER — Other Ambulatory Visit (INDEPENDENT_AMBULATORY_CARE_PROVIDER_SITE_OTHER): Payer: Self-pay | Admitting: Internal Medicine

## 2017-01-03 MED ORDER — BUDESONIDE 3 MG PO CPEP: 3.0000 mg | ORAL_CAPSULE | Freq: Every day | ORAL | 0 refills | Status: DC

## 2017-01-03 NOTE — Telephone Encounter (Signed)
Talked with patient yesterday. He is still having mild abdominal pain. He is concerned that his Crohn's may be relapsing. He is not had nausea vomiting fever or chills. Treated with budesonide for 6 weeks and plan office visit in January 2019.

## 2017-01-04 NOTE — Telephone Encounter (Signed)
Sent a message to Thomas David for an appointment in January with Dr. Laural Golden

## 2017-02-01 ENCOUNTER — Other Ambulatory Visit: Payer: Self-pay | Admitting: Surgery

## 2017-02-01 DIAGNOSIS — K8010 Calculus of gallbladder with chronic cholecystitis without obstruction: Secondary | ICD-10-CM

## 2017-02-05 ENCOUNTER — Ambulatory Visit
Admission: RE | Admit: 2017-02-05 | Discharge: 2017-02-05 | Disposition: A | Discharge status: Home / Self Care | Payer: Medicare Other | Source: Ambulatory Visit | Attending: Surgery | Admitting: Surgery

## 2017-02-05 DIAGNOSIS — K8010 Calculus of gallbladder with chronic cholecystitis without obstruction: Secondary | ICD-10-CM

## 2017-02-05 DIAGNOSIS — R109 Unspecified abdominal pain: Secondary | ICD-10-CM | POA: Diagnosis not present

## 2017-02-06 ENCOUNTER — Other Ambulatory Visit (INDEPENDENT_AMBULATORY_CARE_PROVIDER_SITE_OTHER): Payer: Self-pay | Admitting: Internal Medicine

## 2017-02-06 ENCOUNTER — Other Ambulatory Visit: Payer: Self-pay | Admitting: Family Medicine

## 2017-02-11 ENCOUNTER — Ambulatory Visit (INDEPENDENT_AMBULATORY_CARE_PROVIDER_SITE_OTHER): Payer: Medicare Other | Admitting: Internal Medicine

## 2017-02-11 ENCOUNTER — Encounter (INDEPENDENT_AMBULATORY_CARE_PROVIDER_SITE_OTHER): Payer: Self-pay | Admitting: Internal Medicine

## 2017-02-11 VITALS — BP 118/68 | HR 68 | Temp 97.8°F | Resp 18 | Ht 69.0 in | Wt 205.2 lb

## 2017-02-11 DIAGNOSIS — K5000 Crohn's disease of small intestine without complications: Secondary | ICD-10-CM

## 2017-02-11 DIAGNOSIS — K811 Chronic cholecystitis: Secondary | ICD-10-CM | POA: Diagnosis not present

## 2017-02-11 DIAGNOSIS — K5900 Constipation, unspecified: Secondary | ICD-10-CM | POA: Diagnosis not present

## 2017-02-11 MED ORDER — BISACODYL 10 MG RE SUPP: 10.0000 mg | Freq: Every day | RECTAL | 0 refills | Status: AC | PRN

## 2017-02-11 NOTE — Patient Instructions (Signed)
Can use Dulcolax suppository daily if needed. Please call with progress report few weeks after after surgery.

## 2017-02-11 NOTE — Progress Notes (Signed)
Presenting complaint;  Right-sided abdominal pain. Known gallbladder problems. Follow-up for Crohn's disease.  Database and subjective:  Patient is 82 year old Caucasian male who has several year history of ileocolonic Crohn's disease with remote right hemicolectomy who was last seen on 09/08/2016 following acute symptoms of abdominal pain and diarrhea.  He was felt to have gastroenteritis but did not respond to empiric antibiotic therapy and was begun on budesonide prior to his office visit.   When he was seen in the office he was feeling much better.  He was complaining of being constipated.  He had lost 11 pounds in 4-5 months.  He was quite upset because his wife was diagnosed with metastatic pancreatic carcinoma and was told that she had 7 days to live. The patient's exam was pertinent for a mass in right mid abdomen.  He was not particularly tender.  Abdominal pelvic CT was obtained and revealed markedly distended gallbladder with wall thickening and pericholecystic interstitial thickening findings consistent with chronic cholecystitis.  The study also reveals small sliding hiatal hernia right adrenal lesion felt to be consistent with adenoma as well as reactive adenopathy and renal lesions felt to be cysts. Patient was referred to Dr. Johnathan Hausen. Surgery was delayed because of his wife's illness.  He was finally able to undergo surgery on 11/03/2016. 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  Because of extensive adhesions and inflammation and possibility of fistula Dr. Johnathan Hausen decided to do cholecystostomy to treat acute symptoms and bring him back for elective surgery.  He had cholangiography through the cholecystostomy tube was pulled out in October 2018.  Patient does not feel well.  He has lost 18 pounds since his last visit.  He saw Dr. Johnathan Hausen earlier today and is scheduled for cholecystectomy in the near future.  He has  periumbilical and right upper quadrant abdominal pain every day but it is mild.  He has daily heartburn despite taking omeprazole.  He denies vomiting.  He is now constipated.  He is having bowel movement daily every other day or every 3 days.  He is using MiraLAX which helps.  He denies melena or rectal bleeding.  He is still having scant drainage from the site where cholecystostomy tube was placed. He has finished budesonide and wonders if he should go back on it.   Current Medications: Outpatient Encounter Medications as of 02/11/2017  Medication Sig  . allopurinol (ZYLOPRIM) 300 MG tablet Take 1 tablet (300 mg total) by mouth every morning.  Marland Kitchen amLODipine (NORVASC) 5 MG tablet Take 1 tablet (5 mg total) by mouth daily.  Marland Kitchen amoxicillin-clavulanate (AUGMENTIN) 875-125 MG tablet Take 1 tablet by mouth every 12 (twelve) hours.  Marland Kitchen aspirin EC 81 MG tablet Take 81 mg by mouth at bedtime.  . fluticasone (FLONASE) 50 MCG/ACT nasal spray USE 1 SPRAY INTO BOTH  NOSTRILS DAILY  . losartan (COZAAR) 100 MG tablet Take 1 tablet (100 mg total) by mouth daily.  . mesalamine (PENTASA) 500 MG CR capsule Take 1 capsule (500 mg total) by mouth 4 (four) times daily. (Patient taking differently: Take 1,000 mg by mouth 2 (two) times daily. )  . metoprolol (LOPRESSOR) 50 MG tablet Take 1 tablet (50 mg total) by mouth 2 (two) times daily. (Patient taking differently: Take 100 mg by mouth every evening. Verified it is tartrate and takes 2 tablets at bedtime)  . Multiple Vitamin (MULTIVITAMIN WITH MINERALS) TABS tablet Take 2 tablets by mouth daily.  Marland Kitchen  omeprazole (PRILOSEC) 20 MG capsule TAKE 1 CAPSULE BY MOUTH  DAILY  . oxybutynin (DITROPAN-XL) 5 MG 24 hr tablet TAKE 1 TABLET BY MOUTH AT  BEDTIME  . Probiotic Product (PHILLIPS COLON HEALTH PO) Take 1 tablet by mouth daily with supper.   . simvastatin (ZOCOR) 20 MG tablet Take 1 tablet (20 mg total) by mouth daily.  . vitamin B-12 (CYANOCOBALAMIN) 1000 MCG tablet Take  1,000 mcg by mouth 2 (two) times daily.  . budesonide (ENTOCORT EC) 3 MG 24 hr capsule TAKE 1 CAPSULE BY MOUTH  DAILY (Patient not taking: Reported on 02/11/2017)  . [DISCONTINUED] ferrous sulfate 325 (65 FE) MG tablet Take 1 tablet (325 mg total) by mouth 2 (two) times daily. (Patient not taking: Reported on 10/29/2016)  . [DISCONTINUED] HYDROcodone-acetaminophen (NORCO/VICODIN) 5-325 MG tablet Take 1-2 tablets by mouth every 4 (four) hours as needed for moderate pain. (Patient not taking: Reported on 02/11/2017)  . [DISCONTINUED] OVER THE COUNTER MEDICATION Take 30 mLs by mouth at bedtime. Takes 2-3 times week Xango Juice.   No facility-administered encounter medications on file as of 02/11/2017.      Objective: Blood pressure 118/68, pulse 68, temperature 97.8 F (36.6 C), temperature source Oral, resp. rate 18, height 5' 9"  (1.753 m), weight 205 lb 3.2 oz (93.1 kg). Patient is alert and in no acute distress. Conjunctiva is pink. Sclera is nonicteric Oropharyngeal mucosa is normal. No neck masses or thyromegaly noted. Cardiac exam with regular rhythm normal S1 and S2. No murmur or gallop noted. Lungs are clear to auscultation. Abdomen is full.  He has dressing in right upper quadrant at the site of cholecystostomy tube.  Erythema noted the skin around the dressing.  It is dry.  He has mild tenderness below the right costal margin.  No organomegaly or masses. No LE edema or clubbing noted.  Labs/studies Results: Single contrast barium enema from 02/05/2017 reviewed. Diverticula noted at sigmoid colon without stricture.  Evidence of right hemicolectomy;there is some opacification of small bowel proximal to anastomosis and it is not dilated or strictured.  CT images from 09/17/2016 also reviewed with the patient.   Assessment:  #1.  Crohn's disease.  Patient has long-standing history of ileocolonic Crohn's disease.  He was recently treated for presumed relapse with budesonide.  He is now off  budesonide.  Prior episodes of relapse have been associated with diarrhea but he is now constipated.  I do not believe he has active Crohn's disease and he does not need to be treated again.  #2.  GERD.  PPI therapy is not working anymore.  This is most likely because of gallbladder disease and GERD symptoms should improve once gallbladder has been removed.  If not PPI may have to be changed.  #3.  Chronic cholecystitis.  He presented about 4 months ago with right-sided abdominal mass which turned out to be due to distended gallbladder.  Suspect he had empyema even though he was not very sick.  He was noted to have extensive inflammation and had adhesions therefore he was treated with cholecystostomy.  He saw Dr. Johnathan Hausen earlier today and will undergo cholecystectomy near future.  He may also require resection of bowel because of extensive adhesions.   Plan:  Continue Pentasa at current dose. Patient can use Dulcolax suppository on as-needed basis. He will continue MiraLAX daily. The patient will call with progress report few weeks after surgery and return for office visit in 6 months.

## 2017-02-12 ENCOUNTER — Encounter (HOSPITAL_COMMUNITY): Payer: Self-pay | Admitting: *Deleted

## 2017-02-12 ENCOUNTER — Other Ambulatory Visit: Payer: Self-pay

## 2017-02-12 ENCOUNTER — Ambulatory Visit: Payer: Self-pay | Admitting: Surgery

## 2017-02-12 NOTE — H&P (Signed)
Chief Complaint:  Mucinous drainage from gallbladder- cutaneous tract  History of Present Illness:  Thomas David is an 82 y.o. male underwent laparoscopic cholecystectomy and [facility].  He has a history of Crohn's disease and bowel was stuck to the medial part of his gallbladder which was tremendously inflamed and a cholecystostomy tube was placed.  This has continued to drain mucus and not closed.  He is brought back now for lap possible open cholecystectomy  Past Medical History:  Diagnosis Date  . Arthritis   . Chronic lower back pain   . Crohn's disease ([place])   . Difficult intubation   . GERD (gastroesophageal reflux disease)   . Gout   . History of kidney stones 40 years ago  . Hypercholesteremia   . Hypertension   . Prediabetes     Past Surgical History:  Procedure Laterality Date  . CHOLECYSTECTOMY N/A 11/03/2016   Procedure: LAPAROSCOPIC CHOLECYSTostomy with tube placement ;  Surgeon: Johnathan Hausen, MD;  Location: [facility] ORS;  Service: General;  Laterality: N/A;  . COLECTOMY  1973   "part of large intestines" (05/04/2012)  . COLON RESECTION    . COLONOSCOPY    . COLONOSCOPY N/A 05/23/2014   Procedure: COLONOSCOPY;  Surgeon: Rogene Houston, MD;  [facility];  Service: Endoscopy;  Laterality: N/A;  1200  . ESOPHAGEAL DILATION N/A 05/23/2014   Procedure: ESOPHAGEAL DILATION;  Surgeon: Rogene Houston, MD;  [facility];  Service: Endoscopy;  Laterality: N/A;  . ESOPHAGOGASTRODUODENOSCOPY N/A 03/16/2014   Procedure: esophageal dilation ;  Surgeon: Rogene Houston, MD;  [facility];  Service: Endoscopy;  Laterality: N/A;  . ESOPHAGOGASTRODUODENOSCOPY N/A 05/23/2014   Procedure: ESOPHAGOGASTRODUODENOSCOPY (EGD);  Surgeon: Rogene Houston, MD;  [facility];  Service: Endoscopy;  Laterality: N/A;  . [place]  . IR CHOLANGIOGRAM EXISTING TUBE  11/26/2016  . KIDNEY STONE SURGERY  1970's  . PARTIAL COLECTOMY  ~ 40 years ago   some of large and small  . PROSTATE SURGERY    . THYROIDECTOMY Right 05/04/2012   Procedure: RIGHT HEMI THYROIDECTOMY;  Surgeon: Ascencion Dike, MD;  Location: [facility];  Service: ENT;  Laterality: Right;  . THYROIDECTOMY    . TONSILLECTOMY AND ADENOIDECTOMY     as a child  . TRANSURETHRAL PROSTATECTOMY WITH GYRUS INSTRUMENTS N/A 07/11/2012   Procedure: TRANSURETHRAL PROSTATECTOMY WITH GYRUS INSTRUMENTS;  Surgeon: Claybon Jabs, MD;  Location: [facility];  Service: Urology;  Laterality: N/A;  . UPPER GASTROINTESTINAL ENDOSCOPY      Current Outpatient Medications  Medication Sig Dispense Refill  . acetaminophen (TYLENOL) 500 MG tablet Take 1,000 mg by mouth 2 (two) times daily as needed for moderate pain or headache.    . allopurinol (ZYLOPRIM) 300 MG tablet Take 1 tablet (300 mg total) by mouth every morning. 90 tablet 3  . amLODipine (NORVASC) 5 MG tablet Take 1 tablet (5 mg total) by mouth daily. 90 tablet 3  . amoxicillin-clavulanate (AUGMENTIN) 875-125 MG tablet Take 1 tablet by mouth every 12 (twelve) hours. (Patient not taking: Reported on 02/12/2017) 20 tablet 1  . aspirin EC 81 MG tablet Take 81 mg by mouth at bedtime.    . bisacodyl (DULCOLAX) 10 MG suppository Place 1 suppository (10 mg total) rectally daily as needed for moderate constipation. 12 suppository 0  . budesonide (ENTOCORT EC) 3 MG 24 hr capsule TAKE 1 CAPSULE BY MOUTH  DAILY (Patient not taking: Reported on  02/12/2017) 90 capsule 0  . fluticasone (FLONASE) 50 MCG/ACT nasal spray USE 1 SPRAY INTO BOTH  NOSTRILS DAILY (Patient taking differently: USE 1 SPRAY INTO BOTH  NOSTRILS AT NIGHT AS NEEDED FOR CONGESTION) 32 g 0  . Histamine Dihydrochloride (AUSTRALIAN DREAM ARTHRITIS EX) Apply 1 application topically daily as needed (back pain).    Marland Kitchen losartan (COZAAR) 100 MG tablet Take 1 tablet (100 mg total) by mouth daily. 90 tablet 3  . mesalamine (PENTASA) 500 MG CR capsule Take 1 capsule (500 mg total) by mouth 4 (four) times daily.  (Patient taking differently: Take 1,000 mg by mouth 2 (two) times daily. ) 360 capsule 3  . metoprolol (LOPRESSOR) 50 MG tablet Take 1 tablet (50 mg total) by mouth 2 (two) times daily. (Patient taking differently: Take 50 mg by mouth daily. ) 180 tablet 3  . Multiple Vitamin (MULTIVITAMIN WITH MINERALS) TABS tablet Take 2 tablets by mouth daily.    Marland Kitchen omeprazole (PRILOSEC) 20 MG capsule TAKE 1 CAPSULE BY MOUTH  DAILY 90 capsule 3  . oxybutynin (DITROPAN-XL) 5 MG 24 hr tablet TAKE 1 TABLET BY MOUTH AT  BEDTIME 90 tablet 1  . Probiotic Product (PHILLIPS COLON HEALTH PO) Take 1 tablet by mouth daily with supper.     . simvastatin (ZOCOR) 20 MG tablet Take 1 tablet (20 mg total) by mouth daily. 90 tablet 3  . vitamin B-12 (CYANOCOBALAMIN) 1000 MCG tablet Take 1,000 mcg by mouth 2 (two) times daily.     No current facility-administered medications for this visit.    Patient has no known allergies. Family History  Problem Relation Age of Onset  . Diabetes Mother   . Stroke Father   . Healthy Daughter   . Healthy Son   . Heart disease Sister    Social History:   reports that  has never smoked. he has never used smokeless tobacco. He reports that he does not drink alcohol or use drugs.   REVIEW OF SYSTEMS : Negative except for Crohn's disease  Physical Exam:   There were no vitals taken for this visit. There is no height or weight on file to calculate BMI.  Gen:  WDWN white male NAD  Neurological: Alert and oriented to person, place, and time. Motor and sensory function is grossly intact  Head: Normocephalic and atraumatic.  Eyes: Conjunctivae are normal. Pupils are equal, round, and reactive to light. No scleral icterus.  Neck: Normal range of motion. Neck supple. No tracheal deviation or thyromegaly present.  Cardiovascular:  SR without murmurs or gallops.  No carotid bruits Breast:  Not examined Respiratory: Effort normal.  No respiratory distress. No chest wall tenderness. Breath  sounds normal.  No wheezes, rales or rhonchi.  Abdomen:  Drainage site in the right upper quadrant with a previous right paramedian incision present from his old open procedure for Crohn's GU:  unremarkable Musculoskeletal: Normal range of motion. Extremities are nontender. No cyanosis, edema or clubbing noted Lymphadenopathy: No cervical, preauricular, postauricular or axillary adenopathy is present Skin: Skin is warm and dry. No rash noted. No diaphoresis. No erythema. No pallor. Pscyh: Normal mood and affect. Behavior is normal. Judgment and thought content normal.   LABORATORY RESULTS: No results found for this or any previous visit (from the past 48 hour(s)).   RADIOLOGY RESULTS: No results found.  Problem List: Patient Active Problem List   Diagnosis Date Noted  . Fistula of gallbladder 11/03/2016  . Proteinuria 01/06/2016  . Obstructive sleep apnea 10/04/2013  .  Prediabetes 08/24/2012  . Pernicious anemia 08/24/2012  . BPH (benign prostatic hyperplasia) 05/26/2012  . Crohn's disease of small and large intestines ([place]) 06/23/2011  . Gout 06/23/2011  . Essential hypertension 06/23/2011  . Hyperlipemia 06/23/2011  . GERD (gastroesophageal reflux disease) 06/23/2011    Assessment & Plan: Chronic cholecystitis with drainage from cholecystostomy site for laparoscopic possible open cholecystectomy    Matt B. Hassell Done, MD, [facility] [phone] beeper [phone]  02/12/2017 1:07 PM

## 2017-02-12 NOTE — H&P (View-Only) (Signed)
Chief Complaint:  Mucinous drainage from gallbladder- cutaneous tract  History of Present Illness:  Thomas David is an 82 y.o. male underwent laparoscopic cholecystectomy and [facility].  He has a history of Crohn's disease and bowel was stuck to the medial part of his gallbladder which was tremendously inflamed and a cholecystostomy tube was placed.  This has continued to drain mucus and not closed.  He is brought back now for lap possible open cholecystectomy  Past Medical History:  Diagnosis Date  . Arthritis   . Chronic lower back pain   . Crohn's disease ([place])   . Difficult intubation   . GERD (gastroesophageal reflux disease)   . Gout   . History of kidney stones 40 years ago  . Hypercholesteremia   . Hypertension   . Prediabetes     Past Surgical History:  Procedure Laterality Date  . CHOLECYSTECTOMY N/A 11/03/2016   Procedure: LAPAROSCOPIC CHOLECYSTostomy with tube placement ;  Surgeon: Johnathan Hausen, MD;  Location: [facility] ORS;  Service: General;  Laterality: N/A;  . COLECTOMY  1973   "part of large intestines" (05/04/2012)  . COLON RESECTION    . COLONOSCOPY    . COLONOSCOPY N/A 05/23/2014   Procedure: COLONOSCOPY;  Surgeon: Rogene Houston, MD;  [facility];  Service: Endoscopy;  Laterality: N/A;  1200  . ESOPHAGEAL DILATION N/A 05/23/2014   Procedure: ESOPHAGEAL DILATION;  Surgeon: Rogene Houston, MD;  [facility];  Service: Endoscopy;  Laterality: N/A;  . ESOPHAGOGASTRODUODENOSCOPY N/A 03/16/2014   Procedure: esophageal dilation ;  Surgeon: Rogene Houston, MD;  [facility];  Service: Endoscopy;  Laterality: N/A;  . ESOPHAGOGASTRODUODENOSCOPY N/A 05/23/2014   Procedure: ESOPHAGOGASTRODUODENOSCOPY (EGD);  Surgeon: Rogene Houston, MD;  [facility];  Service: Endoscopy;  Laterality: N/A;  . [place]  . IR CHOLANGIOGRAM EXISTING TUBE  11/26/2016  . KIDNEY STONE SURGERY  1970's  . PARTIAL COLECTOMY  ~ 40 years ago   some of large and small  . PROSTATE SURGERY    . THYROIDECTOMY Right 05/04/2012   Procedure: RIGHT HEMI THYROIDECTOMY;  Surgeon: Ascencion Dike, MD;  Location: [place];  Service: ENT;  Laterality: Right;  . THYROIDECTOMY    . TONSILLECTOMY AND ADENOIDECTOMY     as a child  . TRANSURETHRAL PROSTATECTOMY WITH GYRUS INSTRUMENTS N/A 07/11/2012   Procedure: TRANSURETHRAL PROSTATECTOMY WITH GYRUS INSTRUMENTS;  Surgeon: Claybon Jabs, MD;  Location: [facility];  Service: Urology;  Laterality: N/A;  . UPPER GASTROINTESTINAL ENDOSCOPY      Current Outpatient Medications  Medication Sig Dispense Refill  . acetaminophen (TYLENOL) 500 MG tablet Take 1,000 mg by mouth 2 (two) times daily as needed for moderate pain or headache.    . allopurinol (ZYLOPRIM) 300 MG tablet Take 1 tablet (300 mg total) by mouth every morning. 90 tablet 3  . amLODipine (NORVASC) 5 MG tablet Take 1 tablet (5 mg total) by mouth daily. 90 tablet 3  . amoxicillin-clavulanate (AUGMENTIN) 875-125 MG tablet Take 1 tablet by mouth every 12 (twelve) hours. (Patient not taking: Reported on 02/12/2017) 20 tablet 1  . aspirin EC 81 MG tablet Take 81 mg by mouth at bedtime.    . bisacodyl (DULCOLAX) 10 MG suppository Place 1 suppository (10 mg total) rectally daily as needed for moderate constipation. 12 suppository 0  . budesonide (ENTOCORT EC) 3 MG 24 hr capsule TAKE 1 CAPSULE BY MOUTH  DAILY (Patient not taking: Reported on  02/12/2017) 90 capsule 0  . fluticasone (FLONASE) 50 MCG/ACT nasal spray USE 1 SPRAY INTO BOTH  NOSTRILS DAILY (Patient taking differently: USE 1 SPRAY INTO BOTH  NOSTRILS AT NIGHT AS NEEDED FOR CONGESTION) 32 g 0  . Histamine Dihydrochloride (AUSTRALIAN DREAM ARTHRITIS EX) Apply 1 application topically daily as needed (back pain).    Marland Kitchen losartan (COZAAR) 100 MG tablet Take 1 tablet (100 mg total) by mouth daily. 90 tablet 3  . mesalamine (PENTASA) 500 MG CR capsule Take 1 capsule (500 mg total) by mouth 4 (four) times daily.  (Patient taking differently: Take 1,000 mg by mouth 2 (two) times daily. ) 360 capsule 3  . metoprolol (LOPRESSOR) 50 MG tablet Take 1 tablet (50 mg total) by mouth 2 (two) times daily. (Patient taking differently: Take 50 mg by mouth daily. ) 180 tablet 3  . Multiple Vitamin (MULTIVITAMIN WITH MINERALS) TABS tablet Take 2 tablets by mouth daily.    Marland Kitchen omeprazole (PRILOSEC) 20 MG capsule TAKE 1 CAPSULE BY MOUTH  DAILY 90 capsule 3  . oxybutynin (DITROPAN-XL) 5 MG 24 hr tablet TAKE 1 TABLET BY MOUTH AT  BEDTIME 90 tablet 1  . Probiotic Product (PHILLIPS COLON HEALTH PO) Take 1 tablet by mouth daily with supper.     . simvastatin (ZOCOR) 20 MG tablet Take 1 tablet (20 mg total) by mouth daily. 90 tablet 3  . vitamin B-12 (CYANOCOBALAMIN) 1000 MCG tablet Take 1,000 mcg by mouth 2 (two) times daily.     No current facility-administered medications for this visit.    Patient has no known allergies. Family History  Problem Relation Age of Onset  . Diabetes Mother   . Stroke Father   . Healthy Daughter   . Healthy Son   . Heart disease Sister    Social History:   reports that  has never smoked. he has never used smokeless tobacco. He reports that he does not drink alcohol or use drugs.   REVIEW OF SYSTEMS : Negative except for Crohn's disease  Physical Exam:   There were no vitals taken for this visit. There is no height or weight on file to calculate BMI.  Gen:  WDWN white male NAD  Neurological: Alert and oriented to person, place, and time. Motor and sensory function is grossly intact  Head: Normocephalic and atraumatic.  Eyes: Conjunctivae are normal. Pupils are equal, round, and reactive to light. No scleral icterus.  Neck: Normal range of motion. Neck supple. No tracheal deviation or thyromegaly present.  Cardiovascular:  SR without murmurs or gallops.  No carotid bruits Breast:  Not examined Respiratory: Effort normal.  No respiratory distress. No chest wall tenderness. Breath  sounds normal.  No wheezes, rales or rhonchi.  Abdomen:  Drainage site in the right upper quadrant with a previous right paramedian incision present from his old open procedure for Crohn's GU:  unremarkable Musculoskeletal: Normal range of motion. Extremities are nontender. No cyanosis, edema or clubbing noted Lymphadenopathy: No cervical, preauricular, postauricular or axillary adenopathy is present Skin: Skin is warm and dry. No rash noted. No diaphoresis. No erythema. No pallor. Pscyh: Normal mood and affect. Behavior is normal. Judgment and thought content normal.   LABORATORY RESULTS: No results found for this or any previous visit (from the past 48 hour(s)).   RADIOLOGY RESULTS: No results found.  Problem List: Patient Active Problem List   Diagnosis Date Noted  . Fistula of gallbladder 11/03/2016  . Proteinuria 01/06/2016  . Obstructive sleep apnea 10/04/2013  .  Prediabetes 08/24/2012  . Pernicious anemia 08/24/2012  . BPH (benign prostatic hyperplasia) 05/26/2012  . Crohn's disease of small and large intestines ([place]) 06/23/2011  . Gout 06/23/2011  . Essential hypertension 06/23/2011  . Hyperlipemia 06/23/2011  . GERD (gastroesophageal reflux disease) 06/23/2011    Assessment & Plan: Chronic cholecystitis with drainage from cholecystostomy site for laparoscopic possible open cholecystectomy    Matt B. Hassell Done, MD, [facility] [phone] beeper [phone]  02/12/2017 1:07 PM

## 2017-02-15 ENCOUNTER — Encounter (HOSPITAL_COMMUNITY)
Admission: AD | Disposition: A | Discharge status: Skilled Nursing Facility | Payer: Self-pay | Source: Ambulatory Visit | Attending: Surgery

## 2017-02-15 ENCOUNTER — Ambulatory Visit (HOSPITAL_COMMUNITY): Payer: Medicare Other | Admitting: Certified Registered Nurse Anesthetist

## 2017-02-15 ENCOUNTER — Encounter (HOSPITAL_COMMUNITY): Payer: Self-pay

## 2017-02-15 ENCOUNTER — Other Ambulatory Visit: Payer: Self-pay

## 2017-02-15 ENCOUNTER — Inpatient Hospital Stay (HOSPITAL_COMMUNITY)
Admission: AD | Admit: 2017-02-15 | Discharge: 2017-02-19 | DRG: 415 | Disposition: A | Discharge status: Skilled Nursing Facility | Payer: Medicare Other | Source: Ambulatory Visit | Attending: Surgery | Admitting: Surgery

## 2017-02-15 ENCOUNTER — Ambulatory Visit (HOSPITAL_COMMUNITY): Payer: Medicare Other

## 2017-02-15 ENCOUNTER — Ambulatory Visit: Payer: Self-pay | Admitting: Surgery

## 2017-02-15 DIAGNOSIS — Z4803 Encounter for change or removal of drains: Secondary | ICD-10-CM | POA: Diagnosis not present

## 2017-02-15 DIAGNOSIS — Z9049 Acquired absence of other specified parts of digestive tract: Secondary | ICD-10-CM

## 2017-02-15 DIAGNOSIS — G8929 Other chronic pain: Secondary | ICD-10-CM | POA: Diagnosis present

## 2017-02-15 DIAGNOSIS — Z419 Encounter for procedure for purposes other than remedying health state, unspecified: Secondary | ICD-10-CM

## 2017-02-15 DIAGNOSIS — C23 Malignant neoplasm of gallbladder: Principal | ICD-10-CM | POA: Diagnosis present

## 2017-02-15 DIAGNOSIS — Z7951 Long term (current) use of inhaled steroids: Secondary | ICD-10-CM | POA: Diagnosis not present

## 2017-02-15 DIAGNOSIS — K660 Peritoneal adhesions (postprocedural) (postinfection): Secondary | ICD-10-CM | POA: Diagnosis present

## 2017-02-15 DIAGNOSIS — M549 Dorsalgia, unspecified: Secondary | ICD-10-CM | POA: Diagnosis not present

## 2017-02-15 DIAGNOSIS — M109 Gout, unspecified: Secondary | ICD-10-CM | POA: Diagnosis not present

## 2017-02-15 DIAGNOSIS — E7800 Pure hypercholesterolemia, unspecified: Secondary | ICD-10-CM | POA: Diagnosis present

## 2017-02-15 DIAGNOSIS — C801 Malignant (primary) neoplasm, unspecified: Secondary | ICD-10-CM | POA: Diagnosis present

## 2017-02-15 DIAGNOSIS — Z7982 Long term (current) use of aspirin: Secondary | ICD-10-CM

## 2017-02-15 DIAGNOSIS — E785 Hyperlipidemia, unspecified: Secondary | ICD-10-CM | POA: Diagnosis not present

## 2017-02-15 DIAGNOSIS — Z87442 Personal history of urinary calculi: Secondary | ICD-10-CM

## 2017-02-15 DIAGNOSIS — Z483 Aftercare following surgery for neoplasm: Secondary | ICD-10-CM | POA: Diagnosis not present

## 2017-02-15 DIAGNOSIS — K219 Gastro-esophageal reflux disease without esophagitis: Secondary | ICD-10-CM | POA: Diagnosis present

## 2017-02-15 DIAGNOSIS — R7303 Prediabetes: Secondary | ICD-10-CM | POA: Diagnosis not present

## 2017-02-15 DIAGNOSIS — K811 Chronic cholecystitis: Secondary | ICD-10-CM | POA: Diagnosis not present

## 2017-02-15 DIAGNOSIS — G4733 Obstructive sleep apnea (adult) (pediatric): Secondary | ICD-10-CM | POA: Diagnosis present

## 2017-02-15 DIAGNOSIS — Z833 Family history of diabetes mellitus: Secondary | ICD-10-CM | POA: Diagnosis not present

## 2017-02-15 DIAGNOSIS — M1990 Unspecified osteoarthritis, unspecified site: Secondary | ICD-10-CM | POA: Diagnosis present

## 2017-02-15 DIAGNOSIS — Z6829 Body mass index (BMI) 29.0-29.9, adult: Secondary | ICD-10-CM | POA: Diagnosis not present

## 2017-02-15 DIAGNOSIS — R109 Unspecified abdominal pain: Secondary | ICD-10-CM | POA: Diagnosis not present

## 2017-02-15 DIAGNOSIS — Z823 Family history of stroke: Secondary | ICD-10-CM

## 2017-02-15 DIAGNOSIS — N400 Enlarged prostate without lower urinary tract symptoms: Secondary | ICD-10-CM | POA: Diagnosis not present

## 2017-02-15 DIAGNOSIS — Z8249 Family history of ischemic heart disease and other diseases of the circulatory system: Secondary | ICD-10-CM

## 2017-02-15 DIAGNOSIS — E669 Obesity, unspecified: Secondary | ICD-10-CM | POA: Diagnosis present

## 2017-02-15 DIAGNOSIS — K5090 Crohn's disease, unspecified, without complications: Secondary | ICD-10-CM | POA: Diagnosis present

## 2017-02-15 DIAGNOSIS — K3189 Other diseases of stomach and duodenum: Secondary | ICD-10-CM | POA: Diagnosis not present

## 2017-02-15 DIAGNOSIS — I10 Essential (primary) hypertension: Secondary | ICD-10-CM | POA: Diagnosis not present

## 2017-02-15 DIAGNOSIS — M6281 Muscle weakness (generalized): Secondary | ICD-10-CM | POA: Diagnosis not present

## 2017-02-15 HISTORY — DX: Sleep apnea, unspecified: G47.30

## 2017-02-15 HISTORY — DX: Depression, unspecified: F32.A

## 2017-02-15 HISTORY — DX: Anxiety disorder, unspecified: F41.9

## 2017-02-15 HISTORY — PX: CHOLECYSTECTOMY: SHX55

## 2017-02-15 HISTORY — DX: Major depressive disorder, single episode, unspecified: F32.9

## 2017-02-15 LAB — CBC
HCT: 35.7 % — ABNORMAL LOW (ref 39.0–52.0)
HEMATOCRIT: 38.6 % — AB (ref 39.0–52.0)
Hemoglobin: 12.6 g/dL — ABNORMAL LOW (ref 13.0–17.0)
Hemoglobin: 11.6 g/dL — ABNORMAL LOW (ref 13.0–17.0)
MCH: 27.6 pg (ref 26.0–34.0)
MCH: 27.4 pg (ref 26.0–34.0)
MCHC: 32.6 g/dL (ref 30.0–36.0)
MCHC: 32.5 g/dL (ref 30.0–36.0)
MCV: 84.6 fL (ref 78.0–100.0)
MCV: 84.4 fL (ref 78.0–100.0)
PLATELETS: 245 10*3/uL (ref 150–400)
Platelets: 300 10*3/uL (ref 150–400)
RBC: 4.56 MIL/uL (ref 4.22–5.81)
RBC: 4.23 MIL/uL (ref 4.22–5.81)
RDW: 14.9 % (ref 11.5–15.5)
RDW: 15.1 % (ref 11.5–15.5)
WBC: 12.2 10*3/uL — AB (ref 4.0–10.5)
WBC: 9.7 10*3/uL (ref 4.0–10.5)

## 2017-02-15 LAB — CREATININE, SERUM
Creatinine, Ser: 1.15 mg/dL (ref 0.61–1.24)
GFR calc Af Amer: >60 mL/min (ref >60)
GFR calc non Af Amer: 59 mL/min — ABNORMAL LOW (ref >60)

## 2017-02-15 LAB — BASIC METABOLIC PANEL
ANION GAP: 9 (ref 5–15)
BUN: 15 mg/dL (ref 6–20)
CALCIUM: 9.3 mg/dL (ref 8.9–10.3)
CO2: 26 mmol/L (ref 22–32)
CREATININE: 0.89 mg/dL (ref 0.61–1.24)
Chloride: 101 mmol/L (ref 101–111)
GFR calc Af Amer: >60 mL/min (ref >60)
Glucose, Bld: 159 mg/dL — ABNORMAL HIGH (ref 65–99)
Potassium: 4.2 mmol/L (ref 3.5–5.1)
SODIUM: 136 mmol/L (ref 135–145)

## 2017-02-15 LAB — ABO/RH: ABO/RH(D): O POS

## 2017-02-15 LAB — TYPE AND SCREEN
ABO/RH(D): O POS
ANTIBODY SCREEN: NEGATIVE

## 2017-02-15 SURGERY — LAPAROSCOPIC CHOLECYSTECTOMY
Anesthesia: General

## 2017-02-15 MED ORDER — ONDANSETRON HCL 4 MG/2ML IJ SOLN
INTRAMUSCULAR | Status: DC | PRN
  Administered 2017-02-15: 4 mg via INTRAVENOUS

## 2017-02-15 MED ORDER — MORPHINE SULFATE (PF) 2 MG/ML IV SOLN: 1.0000 mg | INTRAVENOUS | Status: DC | PRN

## 2017-02-15 MED ORDER — HEPARIN SODIUM (PORCINE) 5000 UNIT/ML IJ SOLN
5000.0000 [IU] | Freq: Three times a day (TID) | INTRAMUSCULAR | Status: DC
  Administered 2017-02-15 – 2017-02-19 (×10): 5000 [IU] via SUBCUTANEOUS
  Filled 2017-02-15 (×10): qty 1

## 2017-02-15 MED ORDER — PHENYLEPHRINE 40 MCG/ML (10ML) SYRINGE FOR IV PUSH (FOR BLOOD PRESSURE SUPPORT)
PREFILLED_SYRINGE | INTRAVENOUS | Status: AC
  Filled 2017-02-15: qty 10

## 2017-02-15 MED ORDER — PROMETHAZINE HCL 25 MG/ML IJ SOLN: 6.2500 mg | INTRAMUSCULAR | Status: DC | PRN

## 2017-02-15 MED ORDER — DEXAMETHASONE SODIUM PHOSPHATE 4 MG/ML IJ SOLN
INTRAMUSCULAR | Status: DC | PRN
  Administered 2017-02-15: 10 mg via INTRAVENOUS

## 2017-02-15 MED ORDER — LACTATED RINGERS IV SOLN
INTRAVENOUS | Status: DC | PRN
  Administered 2017-02-15 (×2): via INTRAVENOUS

## 2017-02-15 MED ORDER — PHENYLEPHRINE 40 MCG/ML (10ML) SYRINGE FOR IV PUSH (FOR BLOOD PRESSURE SUPPORT)
PREFILLED_SYRINGE | INTRAVENOUS | Status: DC | PRN
  Administered 2017-02-15: 40 ug via INTRAVENOUS
  Administered 2017-02-15: 80 ug via INTRAVENOUS
  Administered 2017-02-15: 40 ug via INTRAVENOUS
  Administered 2017-02-15 (×5): 80 ug via INTRAVENOUS
  Administered 2017-02-15 (×2): 40 ug via INTRAVENOUS
  Administered 2017-02-15: 80 ug via INTRAVENOUS

## 2017-02-15 MED ORDER — ALLOPURINOL 300 MG PO TABS
300.0000 mg | ORAL_TABLET | Freq: Every morning | ORAL | Status: DC
  Administered 2017-02-16 – 2017-02-19 (×3): 300 mg via ORAL
  Filled 2017-02-15 (×3): qty 1

## 2017-02-15 MED ORDER — FENTANYL CITRATE (PF) 250 MCG/5ML IJ SOLN
INTRAMUSCULAR | Status: AC
  Filled 2017-02-15: qty 5

## 2017-02-15 MED ORDER — PROPOFOL 10 MG/ML IV BOLUS
INTRAVENOUS | Status: DC | PRN
  Administered 2017-02-15: 150 mg via INTRAVENOUS

## 2017-02-15 MED ORDER — ACETAMINOPHEN 500 MG PO TABS: 1000.0000 mg | ORAL_TABLET | ORAL | Status: DC

## 2017-02-15 MED ORDER — CHLORHEXIDINE GLUCONATE CLOTH 2 % EX PADS: 6.0000 | MEDICATED_PAD | Freq: Once | CUTANEOUS | Status: DC

## 2017-02-15 MED ORDER — CEFOTETAN DISODIUM-DEXTROSE 2-2.08 GM-%(50ML) IV SOLR
2.0000 g | INTRAVENOUS | Status: AC
  Administered 2017-02-15: 2 g via INTRAVENOUS
  Filled 2017-02-15: qty 50

## 2017-02-15 MED ORDER — DEXAMETHASONE SODIUM PHOSPHATE 10 MG/ML IJ SOLN
INTRAMUSCULAR | Status: AC
  Filled 2017-02-15: qty 1

## 2017-02-15 MED ORDER — SODIUM CHLORIDE 0.9 % IJ SOLN
INTRAMUSCULAR | Status: DC | PRN
  Administered 2017-02-15: 20 mL

## 2017-02-15 MED ORDER — SUGAMMADEX SODIUM 200 MG/2ML IV SOLN
INTRAVENOUS | Status: DC | PRN
  Administered 2017-02-15: 200 mg via INTRAVENOUS

## 2017-02-15 MED ORDER — FENTANYL CITRATE (PF) 100 MCG/2ML IJ SOLN
INTRAMUSCULAR | Status: DC | PRN
  Administered 2017-02-15 (×3): 50 ug via INTRAVENOUS

## 2017-02-15 MED ORDER — 0.9 % SODIUM CHLORIDE (POUR BTL) OPTIME
TOPICAL | Status: DC | PRN
  Administered 2017-02-15: 1000 mL

## 2017-02-15 MED ORDER — ONDANSETRON HCL 4 MG/2ML IJ SOLN: 4.0000 mg | Freq: Four times a day (QID) | INTRAMUSCULAR | Status: DC | PRN

## 2017-02-15 MED ORDER — LOSARTAN POTASSIUM 50 MG PO TABS
100.0000 mg | ORAL_TABLET | Freq: Every day | ORAL | Status: DC
  Administered 2017-02-15 – 2017-02-19 (×4): 100 mg via ORAL
  Filled 2017-02-15 (×4): qty 2

## 2017-02-15 MED ORDER — CELECOXIB 200 MG PO CAPS: 200.0000 mg | ORAL_CAPSULE | ORAL | Status: DC

## 2017-02-15 MED ORDER — HYDROCODONE-ACETAMINOPHEN 5-325 MG PO TABS
1.0000 | ORAL_TABLET | ORAL | Status: DC | PRN
  Administered 2017-02-15 – 2017-02-17 (×5): 2 via ORAL
  Administered 2017-02-18 (×2): 1 via ORAL
  Administered 2017-02-18 (×2): 2 via ORAL
  Administered 2017-02-19: 1 via ORAL
  Filled 2017-02-15 (×7): qty 2
  Filled 2017-02-15 (×2): qty 1
  Filled 2017-02-15: qty 2

## 2017-02-15 MED ORDER — LIDOCAINE 2% (20 MG/ML) 5 ML SYRINGE
INTRAMUSCULAR | Status: AC
  Filled 2017-02-15: qty 10

## 2017-02-15 MED ORDER — CELECOXIB 200 MG PO CAPS
200.0000 mg | ORAL_CAPSULE | ORAL | Status: AC
  Administered 2017-02-15: 200 mg via ORAL
  Filled 2017-02-15: qty 1

## 2017-02-15 MED ORDER — ROCURONIUM BROMIDE 50 MG/5ML IV SOSY
PREFILLED_SYRINGE | INTRAVENOUS | Status: DC | PRN
  Administered 2017-02-15: 50 mg via INTRAVENOUS
  Administered 2017-02-15 (×2): 10 mg via INTRAVENOUS

## 2017-02-15 MED ORDER — ONDANSETRON 4 MG PO TBDP: 4.0000 mg | ORAL_TABLET | Freq: Four times a day (QID) | ORAL | Status: DC | PRN

## 2017-02-15 MED ORDER — PROPOFOL 10 MG/ML IV BOLUS
INTRAVENOUS | Status: AC
  Filled 2017-02-15: qty 20

## 2017-02-15 MED ORDER — KCL IN DEXTROSE-NACL 20-5-0.45 MEQ/L-%-% IV SOLN
INTRAVENOUS | Status: DC
  Administered 2017-02-15: 1000 mL via INTRAVENOUS
  Administered 2017-02-16: 10:00:00 via INTRAVENOUS
  Administered 2017-02-16: 1000 mL via INTRAVENOUS
  Administered 2017-02-16 – 2017-02-18 (×3): via INTRAVENOUS
  Filled 2017-02-15 (×6): qty 1000

## 2017-02-15 MED ORDER — FLUTICASONE PROPIONATE 50 MCG/ACT NA SUSP
1.0000 | Freq: Every day | NASAL | Status: DC
  Administered 2017-02-15: 1 via NASAL
  Filled 2017-02-15: qty 16

## 2017-02-15 MED ORDER — METOPROLOL TARTRATE 5 MG/5ML IV SOLN: 5.0000 mg | Freq: Four times a day (QID) | INTRAVENOUS | Status: DC | PRN

## 2017-02-15 MED ORDER — IOPAMIDOL (ISOVUE-300) INJECTION 61%
INTRAVENOUS | Status: AC
  Filled 2017-02-15: qty 50

## 2017-02-15 MED ORDER — METOPROLOL TARTRATE 50 MG PO TABS
50.0000 mg | ORAL_TABLET | Freq: Two times a day (BID) | ORAL | Status: DC
  Administered 2017-02-15 – 2017-02-19 (×7): 50 mg via ORAL
  Filled 2017-02-15 (×7): qty 1

## 2017-02-15 MED ORDER — METOPROLOL TARTRATE 5 MG/5ML IV SOLN
INTRAVENOUS | Status: AC
  Filled 2017-02-15: qty 5

## 2017-02-15 MED ORDER — MEPERIDINE HCL 50 MG/ML IJ SOLN: 6.2500 mg | INTRAMUSCULAR | Status: DC | PRN

## 2017-02-15 MED ORDER — HYDROMORPHONE HCL 1 MG/ML IJ SOLN: 0.2500 mg | INTRAMUSCULAR | Status: DC | PRN

## 2017-02-15 MED ORDER — SODIUM CHLORIDE 0.9 % IJ SOLN
INTRAMUSCULAR | Status: AC
  Filled 2017-02-15: qty 20

## 2017-02-15 MED ORDER — LACTATED RINGERS IR SOLN
Status: DC | PRN
  Administered 2017-02-15: 1000 mL

## 2017-02-15 MED ORDER — PANTOPRAZOLE SODIUM 40 MG IV SOLR
40.0000 mg | Freq: Every day | INTRAVENOUS | Status: DC
  Administered 2017-02-15 – 2017-02-18 (×4): 40 mg via INTRAVENOUS
  Filled 2017-02-15 (×4): qty 40

## 2017-02-15 MED ORDER — ROCURONIUM BROMIDE 50 MG/5ML IV SOSY
PREFILLED_SYRINGE | INTRAVENOUS | Status: AC
  Filled 2017-02-15: qty 15

## 2017-02-15 MED ORDER — CEFOTETAN DISODIUM-DEXTROSE 2-2.08 GM-%(50ML) IV SOLR: 2.0000 g | INTRAVENOUS | Status: DC

## 2017-02-15 MED ORDER — LIDOCAINE 2% (20 MG/ML) 5 ML SYRINGE
INTRAMUSCULAR | Status: DC | PRN
  Administered 2017-02-15: 1.5 mg/kg/h via INTRAVENOUS

## 2017-02-15 MED ORDER — ONDANSETRON HCL 4 MG/2ML IJ SOLN
INTRAMUSCULAR | Status: AC
  Filled 2017-02-15: qty 2

## 2017-02-15 MED ORDER — ACETAMINOPHEN 500 MG PO TABS
1000.0000 mg | ORAL_TABLET | ORAL | Status: AC
  Administered 2017-02-15: 1000 mg via ORAL
  Filled 2017-02-15: qty 2

## 2017-02-15 MED ORDER — LIDOCAINE 2% (20 MG/ML) 5 ML SYRINGE
INTRAMUSCULAR | Status: DC | PRN
  Administered 2017-02-15 (×2): 50 mg via INTRAVENOUS

## 2017-02-15 MED ORDER — BUPIVACAINE LIPOSOME 1.3 % IJ SUSP
20.0000 mL | Freq: Once | INTRAMUSCULAR | Status: AC
  Administered 2017-02-15: 20 mL
  Filled 2017-02-15: qty 20

## 2017-02-15 MED ORDER — HEPARIN SODIUM (PORCINE) 5000 UNIT/ML IJ SOLN: 5000.0000 [IU] | Freq: Once | INTRAMUSCULAR | Status: DC

## 2017-02-15 MED ORDER — DEXTROSE 5 % IV SOLN
2.0000 g | Freq: Two times a day (BID) | INTRAVENOUS | Status: AC
  Administered 2017-02-15: 2 g via INTRAVENOUS
  Filled 2017-02-15: qty 2

## 2017-02-15 MED ORDER — GABAPENTIN 300 MG PO CAPS
300.0000 mg | ORAL_CAPSULE | ORAL | Status: AC
  Administered 2017-02-15: 300 mg via ORAL
  Filled 2017-02-15: qty 1

## 2017-02-15 MED ORDER — AMLODIPINE BESYLATE 5 MG PO TABS
5.0000 mg | ORAL_TABLET | Freq: Every day | ORAL | Status: DC
  Administered 2017-02-17 – 2017-02-19 (×2): 5 mg via ORAL
  Filled 2017-02-15 (×3): qty 1

## 2017-02-15 MED ORDER — METOPROLOL TARTRATE 5 MG/5ML IV SOLN
INTRAVENOUS | Status: DC | PRN
  Administered 2017-02-15: 2 mg via INTRAVENOUS

## 2017-02-15 MED ORDER — GABAPENTIN 300 MG PO CAPS: 300.0000 mg | ORAL_CAPSULE | ORAL | Status: DC

## 2017-02-15 MED ORDER — HEPARIN SODIUM (PORCINE) 5000 UNIT/ML IJ SOLN
5000.0000 [IU] | Freq: Once | INTRAMUSCULAR | Status: AC
  Administered 2017-02-15: 5000 [IU] via SUBCUTANEOUS
  Filled 2017-02-15: qty 1

## 2017-02-15 SURGICAL SUPPLY — 51 items
ADH SKN CLS APL DERMABOND .7 (GAUZE/BANDAGES/DRESSINGS) ×1
APL SKNCLS STERI-STRIP NONHPOA (GAUZE/BANDAGES/DRESSINGS)
APPLICATOR COTTON TIP 6IN STRL (MISCELLANEOUS) ×6 IMPLANT
APPLIER CLIP ROT 10 11.4 M/L (STAPLE) ×3
APR CLP MED LRG 11.4X10 (STAPLE) ×1
BAG SPEC RTRVL 10 TROC 200 (ENDOMECHANICALS)
BENZOIN TINCTURE PRP APPL 2/3 (GAUZE/BANDAGES/DRESSINGS) IMPLANT
BINDER ABD UNIV 12 45-62 (WOUND CARE) IMPLANT
BINDER ABDOMINAL 46IN 62IN (WOUND CARE) ×3
CABLE HIGH FREQUENCY MONO STRZ (ELECTRODE) ×3 IMPLANT
CATH REDDICK CHOLANGI 4FR 50CM (CATHETERS) ×3 IMPLANT
CLIP APPLIE ROT 10 11.4 M/L (STAPLE) ×1 IMPLANT
CLOSURE WOUND 1/2 X4 (GAUZE/BANDAGES/DRESSINGS)
COVER MAYO STAND STRL (DRAPES) ×3 IMPLANT
COVER SURGICAL LIGHT HANDLE (MISCELLANEOUS) ×3 IMPLANT
DECANTER SPIKE VIAL GLASS SM (MISCELLANEOUS) ×3 IMPLANT
DERMABOND ADVANCED (GAUZE/BANDAGES/DRESSINGS) ×2
DERMABOND ADVANCED .7 DNX12 (GAUZE/BANDAGES/DRESSINGS) ×1 IMPLANT
DRAIN CHANNEL 19F RND (DRAIN) ×2 IMPLANT
DRAPE C-ARM 42X120 X-RAY (DRAPES) ×3 IMPLANT
ELECT PENCIL ROCKER SW 15FT (MISCELLANEOUS) ×2 IMPLANT
ELECT REM PT RETURN 15FT ADLT (MISCELLANEOUS) ×3 IMPLANT
EVACUATOR SILICONE 100CC (DRAIN) ×2 IMPLANT
GAUZE SPONGE 4X4 12PLY STRL (GAUZE/BANDAGES/DRESSINGS) ×2 IMPLANT
GLOVE BIOGEL M 8.0 STRL (GLOVE) ×3 IMPLANT
GOWN STRL REUS W/TWL XL LVL3 (GOWN DISPOSABLE) ×9 IMPLANT
HEMOSTAT SURGICEL 4X8 (HEMOSTASIS) IMPLANT
IV CATH 14GX2 1/4 (CATHETERS) ×3 IMPLANT
KIT BASIN OR (CUSTOM PROCEDURE TRAY) ×3 IMPLANT
L-HOOK LAP DISP 36CM (ELECTROSURGICAL)
LHOOK LAP DISP 36CM (ELECTROSURGICAL) IMPLANT
PAD TELFA 2X3 NADH STRL (GAUZE/BANDAGES/DRESSINGS) ×2 IMPLANT
POUCH RETRIEVAL ECOSAC 10 (ENDOMECHANICALS) IMPLANT
POUCH RETRIEVAL ECOSAC 10MM (ENDOMECHANICALS)
SCISSORS LAP 5X45 EPIX DISP (ENDOMECHANICALS) ×3 IMPLANT
SET IRRIG TUBING LAPAROSCOPIC (IRRIGATION / IRRIGATOR) ×3 IMPLANT
SHEARS HARMONIC ACE PLUS 45CM (MISCELLANEOUS) ×2 IMPLANT
SLEEVE XCEL OPT CAN 5 100 (ENDOMECHANICALS) ×3 IMPLANT
STRIP CLOSURE SKIN 1/2X4 (GAUZE/BANDAGES/DRESSINGS) IMPLANT
SUT ETHILON 2 0 PS N (SUTURE) ×2 IMPLANT
SUT MNCRL AB 4-0 PS2 18 (SUTURE) ×3 IMPLANT
SUT PDS AB 1 CTX 36 (SUTURE) ×6 IMPLANT
SYR 20CC LL (SYRINGE) ×3 IMPLANT
TAPE CLOTH SURG 4X10 WHT LF (GAUZE/BANDAGES/DRESSINGS) ×2 IMPLANT
TOWEL OR 17X26 10 PK STRL BLUE (TOWEL DISPOSABLE) ×3 IMPLANT
TRAY LAPAROSCOPIC (CUSTOM PROCEDURE TRAY) ×3 IMPLANT
TROCAR BLADELESS OPT 5 100 (ENDOMECHANICALS) ×3 IMPLANT
TROCAR XCEL BLUNT TIP 100MML (ENDOMECHANICALS) IMPLANT
TROCAR XCEL NON-BLD 11X100MML (ENDOMECHANICALS) ×3 IMPLANT
TUBING INSUF HEATED (TUBING) ×3 IMPLANT
YANKAUER SUCT BULB TIP 10FT TU (MISCELLANEOUS) ×2 IMPLANT

## 2017-02-15 NOTE — Anesthesia Procedure Notes (Signed)
Procedure Name: Intubation Date/Time: 02/15/2017 1:55 PM Performed by: Deliah Boston, CRNA Pre-anesthesia Checklist: Patient identified, Emergency Drugs available, Suction available and Patient being monitored Patient Re-evaluated:Patient Re-evaluated prior to induction Oxygen Delivery Method: Circle system utilized Preoxygenation: Pre-oxygenation with 100% oxygen Induction Type: IV induction Ventilation: Mask ventilation without difficulty Laryngoscope Size: Glidescope and 4 Grade View: Grade II Tube type: Oral Tube size: 7.5 mm Number of attempts: 1 Airway Equipment and Method: Stylet and Oral airway Placement Confirmation: ETT inserted through vocal cords under direct vision,  positive ETCO2 and breath sounds checked- equal and bilateral Secured at: 23 cm Tube secured with: Tape Dental Injury: Teeth and Oropharynx as per pre-operative assessment  Difficulty Due To: Difficulty was anticipated and Difficult Airway- due to anterior larynx

## 2017-02-15 NOTE — Anesthesia Preprocedure Evaluation (Signed)
Anesthesia Evaluation  Patient identified by MRN, date of birth, ID band Patient awake    Reviewed: Allergy & Precautions, H&P , NPO status , Patient's Chart, lab work & pertinent test results, reviewed documented beta blocker date and time   History of Anesthesia Complications (+) DIFFICULT AIRWAY  Airway Mallampati: II  TM Distance: >3 FB Neck ROM: Full    Dental  (+) Dental Advisory Given, Missing   Pulmonary neg pulmonary ROS, sleep apnea ,    breath sounds clear to auscultation       Cardiovascular hypertension, Pt. on medications and Pt. on home beta blockers  Rhythm:Regular Rate:Normal     Neuro/Psych PSYCHIATRIC DISORDERS Anxiety Depression negative neurological ROS     GI/Hepatic Neg liver ROS, GERD  ,  Endo/Other  negative endocrine ROS  Renal/GU Renal disease     Musculoskeletal  (+) Arthritis ,   Abdominal (+) + obese,   Peds  Hematology  (+) anemia ,   Anesthesia Other Findings   Reproductive/Obstetrics                             Anesthesia Physical  Anesthesia Plan  ASA: III  Anesthesia Plan: General   Post-op Pain Management:    Induction: Intravenous  PONV Risk Score and Plan: 4 or greater and Ondansetron, Dexamethasone and Treatment may vary due to age or medical condition  Airway Management Planned: Oral ETT  Additional Equipment:   Intra-op Plan:   Post-operative Plan: Extubation in OR  Informed Consent: I have reviewed the patients History and Physical, chart, labs and discussed the procedure including the risks, benefits and alternatives for the proposed anesthesia with the patient or authorized representative who has indicated his/her understanding and acceptance.   Dental advisory given  Plan Discussed with: CRNA  Anesthesia Plan Comments:         Anesthesia Quick Evaluation

## 2017-02-15 NOTE — Interval H&P Note (Signed)
History and Physical Interval Note:  02/15/2017 1:07 PM  Thomas David  has presented today for surgery, with the diagnosis of [place]  The various methods of treatment have been discussed with the patient and family. After consideration of risks, benefits and other options for treatment, the patient has consented to  Procedure(s): LAPAROSCOPIC ASSISTED CHOLECYSTECTOMY, POSSIBLE OPEN, POSSIBLE BOWEL RESECTION (N/A) as a surgical intervention .  The patient's history has been reviewed, patient examined, no change in status, stable for surgery.  I have reviewed the patient's chart and labs.  Questions were answered to the patient's satisfaction.     Pedro Earls

## 2017-02-15 NOTE — Transfer of Care (Signed)
Immediate Anesthesia Transfer of Care Note  Patient: Thomas David  Procedure(s) Performed: LAPAROSCOPIC SUBTOTAL CHOLECYSTECTOMY, BIOPSY OF GALLBLADDER (N/A )  Patient Location: PACU  Anesthesia Type:General  Level of Consciousness: drowsy and patient cooperative  Airway & Oxygen Therapy: Patient Spontanous Breathing and Patient connected to face mask oxygen  Post-op Assessment: Report given to RN and Post -op Vital signs reviewed and stable  Post vital signs: Reviewed and stable  Last Vitals:  Vitals:   02/15/17 1137  BP: 134/66  Pulse: 82  Resp: 18  Temp: 36.6 C  SpO2: 100%    Last Pain:  Vitals:   02/15/17 1137  TempSrc: Oral         Complications: No apparent anesthesia complications

## 2017-02-15 NOTE — Op Note (Signed)
Surgeon: Kaylyn Lim, MD, FACS  Asst:  Armandina Gemma, MD, FACS  Anes:  general  Preop Dx: Chronic cholecystitis severe with possible bowel fistula Postop Dx: Adenocarcinoma invading infundibulum of gallbladder producing obstruction  Procedure: Laparoscopy, open subtotal cholecystectomy with biopsy of gallbladder mucosa in the neck and ablation of the mucosa with Argon beam coagulator Location Surgery: [facility] OR 2 Complications: none  EBL:   30 cc  Drains: 19 Blake drain in the right upper quadrant  Description of Procedure:  The patient was taken to OR 2 .  After anesthesia was administered and the patient was prepped a timeout was performed.  Access achieved with a 5 mm Optiview through the left upper quadrant.  This was followed by second trocar placed toward the midline more inferiorly.  The cholecystostomy tube had [place] effectively with omentum I took this down bluntly and transected the tract to the skin.  I then used blunt dissection to dissect around the gallbladder and again reaching the impasse near the infundibulum where the bowel seem to be stuck to the duodenum or small bowel.  The anatomy was very obscured.  Elected at this point to open and created a's Coker type subcostal incision excising the old tract with my incision.  Upon entering the abdomen the gallbladder was been decompressed as it it contained a lot of of clear mucinous white bile.  We took down some of the small bowel adhesions from her previous Crohn's surgery to give [place] a little more freedom and then began dissecting the gallbladder.  However as I indicated down near the infundibulum seem to be pretty well fused to the bowel.  Elected at that point to take it from the top down for about a centimeter and then I excised the dome of the gallbladder.  Once I had visualization I went down to where the gallbladder should go into the cystic duct and I could not pass anything reliably.  I could I try to get a reticulocyte catheter  and there but nothing would stay.  I fell down there and it was very hard and woody but I did not see any stone material at this point.  I went ahead and the the area of the infundibulum where it seemed to be going down there was some friable-looking material and I cut some of that away with sharp dissection and sent it for frozen section.  Sometime later the path came back as adenocarcinoma.    In the meantime I removed that large dome using a harmonic scalpel and then I got the argon beam coagulator out and ablated the mucosa of the inside of the gallbladder.  With the path report indicating adenocarcinoma felt like this may be something from the bowel that is growing into the gallbladder although there were little nodules about on the peritoneum about 2 mm in diameter there were consistent with possible implants.  [place] drain was inserted through the lower trocar on the right and placed in the gallbladder bed and cut to fit.  The incision was closed in 2 layers with running #1 PDS.  The wounds were injected with Exparel and closed with 4-0 Monocryl.  The patient tolerated the procedure well was taken recovery in satisfactory condition.  The patient tolerated the procedure well and was taken to the PACU in stable condition.     Matt B. Hassell Done, [place], [facility], [place]

## 2017-02-15 NOTE — Anesthesia Postprocedure Evaluation (Signed)
Anesthesia Post Note  Patient: Thomas David  Procedure(s) Performed: LAPAROSCOPIC SUBTOTAL CHOLECYSTECTOMY, BIOPSY OF GALLBLADDER (N/A )     Patient location during evaluation: PACU Anesthesia Type: General Level of consciousness: sedated and patient cooperative Pain management: pain level controlled Vital Signs Assessment: post-procedure vital signs reviewed and stable Respiratory status: spontaneous breathing Cardiovascular status: stable Anesthetic complications: no    Last Vitals:  Vitals:   02/15/17 1715 02/15/17 1730  BP: (!) 154/77 (!) 151/76  Pulse: 72 69  Resp: 12 13  Temp:    SpO2: 99% 100%    Last Pain:  Vitals:   02/15/17 1654  TempSrc:   PainSc: 0-No pain                 Nolon Nations

## 2017-02-15 NOTE — Progress Notes (Signed)
Pt refused CPAP qhs.  Pt states that he doesn't usually wear his CPAP when he comes to the hospital overnight.  Education provided to Pt but still refuses.  Pt encouraged to contact RT if he changes his mind.

## 2017-02-16 ENCOUNTER — Encounter (HOSPITAL_COMMUNITY): Payer: Self-pay | Admitting: Surgery

## 2017-02-16 LAB — COMPREHENSIVE METABOLIC PANEL
ALBUMIN: 2.7 g/dL — AB (ref 3.5–5.0)
ALT: 44 U/L (ref 17–63)
AST: 40 U/L (ref 15–41)
Alkaline Phosphatase: 588 U/L — ABNORMAL HIGH (ref 38–126)
Anion gap: 9 (ref 5–15)
BUN: 18 mg/dL (ref 6–20)
CHLORIDE: 100 mmol/L — AB (ref 101–111)
CO2: 24 mmol/L (ref 22–32)
CREATININE: 1.18 mg/dL (ref 0.61–1.24)
Calcium: 8.6 mg/dL — ABNORMAL LOW (ref 8.9–10.3)
GFR calc Af Amer: >60 mL/min (ref >60)
GFR, EST NON AFRICAN AMERICAN: 57 mL/min — AB (ref >60)
GLUCOSE: 257 mg/dL — AB (ref 65–99)
POTASSIUM: 4.6 mmol/L (ref 3.5–5.1)
Sodium: 133 mmol/L — ABNORMAL LOW (ref 135–145)
Total Bilirubin: 0.4 mg/dL (ref 0.3–1.2)
Total Protein: 6.8 g/dL (ref 6.5–8.1)

## 2017-02-16 LAB — CBC
HCT: 33.4 % — ABNORMAL LOW (ref 39.0–52.0)
Hemoglobin: 10.8 g/dL — ABNORMAL LOW (ref 13.0–17.0)
MCH: 27.5 pg (ref 26.0–34.0)
MCHC: 32.3 g/dL (ref 30.0–36.0)
MCV: 85 fL (ref 78.0–100.0)
PLATELETS: 232 10*3/uL (ref 150–400)
RBC: 3.93 MIL/uL — AB (ref 4.22–5.81)
RDW: 15.4 % (ref 11.5–15.5)
WBC: 11.8 10*3/uL — AB (ref 4.0–10.5)

## 2017-02-16 MED ORDER — FLUTICASONE PROPIONATE 50 MCG/ACT NA SUSP
1.0000 | Freq: Every day | NASAL | Status: DC
  Administered 2017-02-16 – 2017-02-18 (×3): 1 via NASAL

## 2017-02-16 NOTE — Progress Notes (Signed)
Pt refused CPAP qhs.  Pt encouraged to contact RT if he changes her mind.

## 2017-02-16 NOTE — Progress Notes (Signed)
Patient ID: Thomas David, male   DOB: 1935-07-05, 82 y.o.   MRN: [id number] [facility] Surgery Progress Note:   1 Day Post-Op  Subjective: Mental status is clear.  More upbeat Objective: Vital signs in last 24 hours: Temp:  [97.7 F (36.5 C)-99.2 F (37.3 C)] 98.6 F (37 C) (01/15 1400) Pulse Rate:  [62-93] 69 (01/15 1400) Resp:  [12-16] 16 (01/15 1400) BP: (126-159)/(66-78) 126/66 (01/15 1400) SpO2:  [96 %-100 %] 99 % (01/15 1400)  Intake/Output from previous day: 01/14 0701 - 01/15 0700 In: 3872.5 [P.O.:1200; I.V.:2622.5; IV Piggyback:50] Out: 665 [Urine:455; Drains:160; Blood:50] Intake/Output this shift: Total I/O In: 701.3 [P.O.:425; I.V.:256.3; Other:20] Out: 60 [Urine:950; Drains:38]  Physical Exam: Work of breathing is normal.  JP dark bloody-clot  Lab Results:  Results for orders placed or performed during the hospital encounter of 02/15/17 (from the past 48 hour(s))  Type and screen All Cardiac and thoracic surgeries, spinal fusions, myomectomies, craniotomies, colon & liver resections, total joint revisions, same day c-section with placenta previa or accreta.     Status: None   Collection Time: 02/15/17 11:44 AM  Result Value Ref Range   ABO/RH(D) O POS    Antibody Screen NEG    Sample Expiration 02/18/2017   CBC     Status: Abnormal   Collection Time: 02/15/17 11:44 AM  Result Value Ref Range   WBC 12.2 (H) 4.0 - 10.5 K/uL   RBC 4.56 4.22 - 5.81 MIL/uL   Hemoglobin 12.6 (L) 13.0 - 17.0 g/dL   HCT 38.6 (L) 39.0 - 52.0 %   MCV 84.6 78.0 - 100.0 fL   MCH 27.6 26.0 - 34.0 pg   MCHC 32.6 30.0 - 36.0 g/dL   RDW 14.9 11.5 - 15.5 %   Platelets 300 150 - 400 K/uL  Basic metabolic panel     Status: Abnormal   Collection Time: 02/15/17 11:44 AM  Result Value Ref Range   Sodium 136 135 - 145 mmol/L   Potassium 4.2 3.5 - 5.1 mmol/L   Chloride 101 101 - 111 mmol/L   CO2 26 22 - 32 mmol/L   Glucose, Bld 159 (H) 65 - 99 mg/dL   BUN 15 6 - 20 mg/dL   Creatinine, Ser 0.89 0.61 - 1.24 mg/dL   Calcium 9.3 8.9 - 10.3 mg/dL   GFR calc non Af Amer >60 >60 mL/min   GFR calc Af Amer >60 >60 mL/min    Comment: (NOTE) The eGFR has been calculated using the CKD EPI equation. This calculation has not been validated in all clinical situations. eGFR's persistently <60 mL/min signify possible Chronic Kidney Disease.    Anion gap 9 5 - 15  ABO/Rh     Status: None   Collection Time: 02/15/17 11:44 AM  Result Value Ref Range   ABO/RH(D) O POS   CBC     Status: Abnormal   Collection Time: 02/15/17  6:32 PM  Result Value Ref Range   WBC 9.7 4.0 - 10.5 K/uL   RBC 4.23 4.22 - 5.81 MIL/uL   Hemoglobin 11.6 (L) 13.0 - 17.0 g/dL   HCT 35.7 (L) 39.0 - 52.0 %   MCV 84.4 78.0 - 100.0 fL   MCH 27.4 26.0 - 34.0 pg   MCHC 32.5 30.0 - 36.0 g/dL   RDW 15.1 11.5 - 15.5 %   Platelets 245 150 - 400 K/uL  Creatinine, serum     Status: Abnormal   Collection Time: 02/15/17  6:32 PM  Result Value Ref Range   Creatinine, Ser 1.15 0.61 - 1.24 mg/dL   GFR calc non Af Amer 59 (L) >60 mL/min   GFR calc Af Amer >60 >60 mL/min    Comment: (NOTE) The eGFR has been calculated using the CKD EPI equation. This calculation has not been validated in all clinical situations. eGFR's persistently <60 mL/min signify possible Chronic Kidney Disease.   CBC     Status: Abnormal   Collection Time: 02/16/17  4:38 AM  Result Value Ref Range   WBC 11.8 (H) 4.0 - 10.5 K/uL   RBC 3.93 (L) 4.22 - 5.81 MIL/uL   Hemoglobin 10.8 (L) 13.0 - 17.0 g/dL   HCT 33.4 (L) 39.0 - 52.0 %   MCV 85.0 78.0 - 100.0 fL   MCH 27.5 26.0 - 34.0 pg   MCHC 32.3 30.0 - 36.0 g/dL   RDW 15.4 11.5 - 15.5 %   Platelets 232 150 - 400 K/uL  Comprehensive metabolic panel     Status: Abnormal   Collection Time: 02/16/17  4:38 AM  Result Value Ref Range   Sodium 133 (L) 135 - 145 mmol/L   Potassium 4.6 3.5 - 5.1 mmol/L   Chloride 100 (L) 101 - 111 mmol/L   CO2 24 22 - 32 mmol/L   Glucose, Bld 257 (H) 65  - 99 mg/dL   BUN 18 6 - 20 mg/dL   Creatinine, Ser 1.18 0.61 - 1.24 mg/dL   Calcium 8.6 (L) 8.9 - 10.3 mg/dL   Total Protein 6.8 6.5 - 8.1 g/dL   Albumin 2.7 (L) 3.5 - 5.0 g/dL   AST 40 15 - 41 U/L   ALT 44 17 - 63 U/L   Alkaline Phosphatase 588 (H) 38 - 126 U/L   Total Bilirubin 0.4 0.3 - 1.2 mg/dL   GFR calc non Af Amer 57 (L) >60 mL/min   GFR calc Af Amer >60 >60 mL/min    Comment: (NOTE) The eGFR has been calculated using the CKD EPI equation. This calculation has not been validated in all clinical situations. eGFR's persistently <60 mL/min signify possible Chronic Kidney Disease.    Anion gap 9 5 - 15    Radiology/Results: No results found.  Anti-infectives: Anti-infectives (From admission, onward)   Start     Dose/Rate Route Frequency Ordered Stop   02/15/17 2200  cefoTEtan (CEFOTAN) 2 g in dextrose 5 % 50 mL IVPB     2 g 100 mL/hr over 30 Minutes Intravenous Every 12 hours 02/15/17 1817 02/15/17 2147   02/15/17 1145  cefoTEtan in Dextrose 5% (CEFOTAN) IVPB 2 g  Status:  Discontinued     2 g Intravenous On call to O.R. 02/15/17 1133 02/15/17 1141   02/15/17 1130  cefoTEtan in Dextrose 5% (CEFOTAN) IVPB 2 g     2 g Intravenous On call to O.R. 02/15/17 1130 02/15/17 1358      Assessment/Plan: Problem List: Patient Active Problem List   Diagnosis Date Noted  . Carcinoma ([place]) 02/15/2017  . Fistula of gallbladder 11/03/2016  . Proteinuria 01/06/2016  . Obstructive sleep apnea 10/04/2013  . Prediabetes 08/24/2012  . Pernicious anemia 08/24/2012  . BPH (benign prostatic hyperplasia) 05/26/2012  . Crohn's disease of small and large intestines ([place]) 06/23/2011  . Gout 06/23/2011  . Essential hypertension 06/23/2011  . Hyperlipemia 06/23/2011  . GERD (gastroesophageal reflux disease) 06/23/2011    Awaiting final path report and will discuss with oncology 1 Day Post-Op    LOS: 1  day   Matt B. Hassell Done, MD, [facility] [phone]  beeper [phone]  02/16/2017 4:49 PM

## 2017-02-17 NOTE — Progress Notes (Signed)
Pt complaining of feeling worse today than yesterday.  Complaining of pain in left quadrant and lower part of abdomen.  Wonders if a bowel movement would help him feel better. Will try prune juice to see if that helps.

## 2017-02-17 NOTE — Consult Note (Signed)
Subjective:   HPI  The patient is a 82 year old male who underwent a open subtotal cholecystectomy with biopsy of gallbladder mucosa and ablation of the rest of the gallbladder 2 days ago. Biopsy came back adenocarcinoma invading infundibulum of gallbladder. Dr. Hassell Done requested GI consult specifically for EGD to determine if there was a duodenal or other upper GI lesion.  Patient has a history of Crohn's disease.  Review of Systems No chest pain or shortness of breath  Past Medical History:  Diagnosis Date  . Anxiety   . Arthritis   . Chronic lower back pain   . Crohn's disease ([place])   . Depression   . Difficult intubation    patient denies on 02/12/17  . GERD (gastroesophageal reflux disease)   . Gout   . History of kidney stones 40 years ago  . Hypercholesteremia   . Hypertension   . Prediabetes   . Sleep apnea    cpap    Past Surgical History:  Procedure Laterality Date  . CHOLECYSTECTOMY N/A 11/03/2016   Procedure: LAPAROSCOPIC CHOLECYSTostomy with tube placement ;  Surgeon: Johnathan Hausen, MD;  Location: [facility];  Service: General;  Laterality: N/A;  . CHOLECYSTECTOMY N/A 02/15/2017   Procedure: LAPAROSCOPIC SUBTOTAL CHOLECYSTECTOMY, BIOPSY OF GALLBLADDER;  Surgeon: Johnathan Hausen, MD;  Location: [facility];  Service: General;  Laterality: N/A;  . COLECTOMY  1973   "part of large intestines" (05/04/2012)  . COLON RESECTION    . COLONOSCOPY    . COLONOSCOPY N/A 05/23/2014   Procedure: COLONOSCOPY;  Surgeon: Rogene Houston, MD;  [facility];  Service: Endoscopy;  Laterality: N/A;  1200  . ESOPHAGEAL DILATION N/A 05/23/2014   Procedure: ESOPHAGEAL DILATION;  Surgeon: Rogene Houston, MD;  [facility];  Service: Endoscopy;  Laterality: N/A;  . ESOPHAGOGASTRODUODENOSCOPY N/A 03/16/2014   Procedure: esophageal dilation ;  Surgeon: Rogene Houston, MD;  [facility];  Service: Endoscopy;  Laterality: N/A;  . ESOPHAGOGASTRODUODENOSCOPY N/A  05/23/2014   Procedure: ESOPHAGOGASTRODUODENOSCOPY (EGD);  Surgeon: Rogene Houston, MD;  [facility];  Service: Endoscopy;  Laterality: N/A;  . [place]  . IR CHOLANGIOGRAM EXISTING TUBE  11/26/2016  . KIDNEY STONE SURGERY  1970's  . PARTIAL COLECTOMY  ~ 40 years ago   some of large and small  . PROSTATE SURGERY    . THYROIDECTOMY Right 05/04/2012   Procedure: RIGHT HEMI THYROIDECTOMY;  Surgeon: Ascencion Dike, MD;  Location: [facility];  Service: ENT;  Laterality: Right;  . THYROIDECTOMY    . TONSILLECTOMY AND ADENOIDECTOMY     as a child  . TRANSURETHRAL PROSTATECTOMY WITH GYRUS INSTRUMENTS N/A 07/11/2012   Procedure: TRANSURETHRAL PROSTATECTOMY WITH GYRUS INSTRUMENTS;  Surgeon: Claybon Jabs, MD;  Location: [facility];  Service: Urology;  Laterality: N/A;  . UPPER GASTROINTESTINAL ENDOSCOPY     Social History   Socioeconomic History  . Marital status: Widowed    Spouse name: Not on file  . Number of children: Not on file  . Years of education: Not on file  . Highest education level: Not on file  Social Needs  . Financial resource strain: Not on file  . Food insecurity - worry: Not on file  . Food insecurity - inability: Not on file  . Transportation needs - medical: Not on file  . Transportation needs - non-medical: Not on file  Occupational History  . Not on file  Tobacco Use  . Smoking status:  Never Smoker  . Smokeless tobacco: Never Used  Substance and Sexual Activity  . Alcohol use: No    Alcohol/week: 0.0 oz  . Drug use: No  . Sexual activity: No  Other Topics Concern  . Not on file  Social History Narrative   ** Merged History Encounter **       family history includes Diabetes in his mother; Healthy in his daughter and son; Heart disease in his sister; Stroke in his father.  Current Facility-Administered Medications:  .  allopurinol (ZYLOPRIM) tablet 300 mg, 300 mg, Oral, q morning - 10a, Johnathan Hausen, MD, 300 mg at 02/17/17 0953 .   amLODipine (NORVASC) tablet 5 mg, 5 mg, Oral, Daily, Johnathan Hausen, MD, 5 mg at 02/17/17 0953 .  dextrose 5 % and 0.45 % NaCl with KCl 20 mEq/L infusion, , Intravenous, Continuous, Johnathan Hausen, MD, Last Rate: 75 mL/hr at 02/16/17 2351, 1,000 mL at 02/16/17 2351 .  fluticasone (FLONASE) 50 MCG/ACT nasal spray 1 spray, 1 spray, Each Nare, QHS, Green, Terri L, RPH, 1 spray at 02/16/17 2156 .  heparin injection 5,000 Units, 5,000 Units, Subcutaneous, Q8H, Johnathan Hausen, MD, 5,000 Units at 02/17/17 [phone] .  HYDROcodone-acetaminophen (NORCO/VICODIN) 5-325 MG per tablet 1-2 tablet, 1-2 tablet, Oral, Q4H PRN, Johnathan Hausen, MD, 2 tablet at 02/17/17 [phone] .  losartan (COZAAR) tablet 100 mg, 100 mg, Oral, Daily, Johnathan Hausen, MD, 100 mg at 02/17/17 0953 .  metoprolol tartrate (LOPRESSOR) injection 5 mg, 5 mg, Intravenous, Q6H PRN, Johnathan Hausen, MD .  metoprolol tartrate (LOPRESSOR) tablet 50 mg, 50 mg, Oral, BID, Johnathan Hausen, MD, 50 mg at 02/17/17 0953 .  morphine 2 MG/ML injection 1 mg, 1 mg, Intravenous, Q1H PRN, Johnathan Hausen, MD .  ondansetron (ZOFRAN-ODT) disintegrating tablet 4 mg, 4 mg, Oral, Q6H PRN **OR** ondansetron (ZOFRAN) injection 4 mg, 4 mg, Intravenous, Q6H PRN, Johnathan Hausen, MD .  pantoprazole (PROTONIX) injection 40 mg, 40 mg, Intravenous, Candy Sledge, MD, 40 mg at 02/16/17 2147 No Known Allergies   Objective:     BP 131/64 (BP Location: Left Arm)   Pulse 63   Temp 97.6 F (36.4 C) (Oral)   Resp 16   Ht 5' 9"  (1.753 m)   Wt 89.8 kg (198 lb)   SpO2 99%   BMI 29.24 kg/m   No distress  Heart regular rhythm no murmurs  Lungs clear  Abdomen: There is an abdominal binder on but no significant pain on palpation  Laboratory No components found for: D1    Assessment:     Adenocarcinoma invading infundibulum of gallbladder      Plan:     EGD to evaluate upper GI tract

## 2017-02-17 NOTE — Progress Notes (Signed)
Pt has declined use if CPAP during hospital stay.  RT to monitor and assess as needed.

## 2017-02-17 NOTE — Clinical Social Work Note (Signed)
Clinical Social Work Assessment  Patient Details  Name: Thomas David MRN: [id number] Date of Birth: [birth year]  Date of referral:  02/17/17               Reason for consult:  Facility Placement                Permission sought to share information with:  Chartered certified accountant granted to share information::  Yes, Verbal Permission Granted  Name::        Agency::  [facility] in [place], [place].   Relationship::     Contact Information:     Housing/Transportation Living arrangements for the past 2 months:  [place] of Information:  Patient Patient Interpreter Needed:  None Criminal Activity/Legal Involvement Pertinent to Current Situation/Hospitalization:  No - Comment as needed Significant Relationships:  Adult Children, Other Family Members Lives with:  Facility Resident Do you feel safe going back to the place where you live?  Yes Need for family participation in patient care:  Yes (Comment)  Care giving concerns:  Patient admitted for Mucinous drainage from gallbladder-cutaneous tract.  Patient ambulates well without assistance, the patient will discharge back to retirement community.    Social Worker assessment / plan:  CSW met with the patient at bedside, explain role and reason for visit- to assist with discharge planning back to the facility.The patient reports the facility is owned by [facility] but is managed by [employer]. The patient reports he has lived at the retirement community for 3 years.  Patient reports he is independent and able to complete his own ADL's. Patient reports no difficulty walking. He reports walking several times around the nurses station.  Patient reports his spouse died 10-22-2016 but his adult children and other relatives are involved in his care.   Patient provided contact information for the admission coordinator -Eulis Foster [phone]. CSW left voicemail.    Plan: Assist with discharge  back to [facility].  Employment status:  Retired Nurse, adult PT Recommendations:  Not assessed at this time Information / Referral to community resources:     Patient/Family's Response to care:  Patient appreciative of CSW visit and help with discharge back to retirement community.   Patient/Family's Understanding of and Emotional Response to Diagnosis, Current Treatment, and Prognosis: Patient knowledgeable about his diagnosis and current treatment plan. Patient underwent Biopsy and is learning more about the results.   Emotional Assessment Appearance:  Developmentally appropriate Attitude/Demeanor/Rapport:    Affect (typically observed):  Accepting, Pleasant Orientation:  Oriented to Self, Oriented to Place, Oriented to  Time, Oriented to Situation Alcohol / Substance use:  Not Applicable Psych involvement (Current and /or in the community):  No (Comment)  Discharge Needs  Concerns to be addressed:    Readmission within the last 30 days:    Current discharge risk:  None Barriers to Discharge:  Continued Medical Work up   [employer], LCSW 02/17/2017, 1:46 PM

## 2017-02-17 NOTE — Progress Notes (Signed)
Patient ID: Thomas David, male   DOB: Nov 29, 1935, 82 y.o.   MRN: [id number] [facility] Progress Note:   2 Days Post-Op  Subjective: Mental status is clear.  Feeling pretty good. Objective: Vital signs in last 24 hours: Temp:  [97.6 F (36.4 C)-98.6 F (37 C)] 97.6 F (36.4 C) (01/16 0505) Pulse Rate:  [60-69] 63 (01/16 0505) Resp:  [14-16] 16 (01/16 0505) BP: (126-131)/(51-70) 131/64 (01/16 0505) SpO2:  [97 %-99 %] 99 % (01/16 0505)  Intake/Output from previous day: 01/15 0701 - 01/16 0700 In: 2611.3 [P.O.:1135; I.V.:1456.3] Out: 2304 [Urine:2200; Drains:104] Intake/Output this shift: Total I/O In: 240 [P.O.:240] Out: 225 [Urine:225]  Physical Exam: Work of breathing is normal.  Path pending.  JP old blood  Lab Results:  Results for orders placed or performed during the hospital encounter of 02/15/17 (from the past 48 hour(s))  Type and screen All Cardiac and thoracic surgeries, spinal fusions, myomectomies, craniotomies, colon & liver resections, total joint revisions, same day c-section with placenta previa or accreta.     Status: None   Collection Time: 02/15/17 11:44 AM  Result Value Ref Range   ABO/RH(D) O POS    Antibody Screen NEG    Sample Expiration 02/18/2017   CBC     Status: Abnormal   Collection Time: 02/15/17 11:44 AM  Result Value Ref Range   WBC 12.2 (H) 4.0 - 10.5 K/uL   RBC 4.56 4.22 - 5.81 MIL/uL   Hemoglobin 12.6 (L) 13.0 - 17.0 g/dL   HCT 38.6 (L) 39.0 - 52.0 %   MCV 84.6 78.0 - 100.0 fL   MCH 27.6 26.0 - 34.0 pg   MCHC 32.6 30.0 - 36.0 g/dL   RDW 14.9 11.5 - 15.5 %   Platelets 300 150 - 400 K/uL  Basic metabolic panel     Status: Abnormal   Collection Time: 02/15/17 11:44 AM  Result Value Ref Range   Sodium 136 135 - 145 mmol/L   Potassium 4.2 3.5 - 5.1 mmol/L   Chloride 101 101 - 111 mmol/L   CO2 26 22 - 32 mmol/L   Glucose, Bld 159 (H) 65 - 99 mg/dL   BUN 15 6 - 20 mg/dL   Creatinine, Ser 0.89 0.61 - 1.24 mg/dL   Calcium 9.3  8.9 - 10.3 mg/dL   GFR calc non Af Amer >60 >60 mL/min   GFR calc Af Amer >60 >60 mL/min    Comment: (NOTE) The eGFR has been calculated using the CKD EPI equation. This calculation has not been validated in all clinical situations. eGFR's persistently <60 mL/min signify possible Chronic Kidney Disease.    Anion gap 9 5 - 15  ABO/Rh     Status: None   Collection Time: 02/15/17 11:44 AM  Result Value Ref Range   ABO/RH(D) O POS   CBC     Status: Abnormal   Collection Time: 02/15/17  6:32 PM  Result Value Ref Range   WBC 9.7 4.0 - 10.5 K/uL   RBC 4.23 4.22 - 5.81 MIL/uL   Hemoglobin 11.6 (L) 13.0 - 17.0 g/dL   HCT 35.7 (L) 39.0 - 52.0 %   MCV 84.4 78.0 - 100.0 fL   MCH 27.4 26.0 - 34.0 pg   MCHC 32.5 30.0 - 36.0 g/dL   RDW 15.1 11.5 - 15.5 %   Platelets 245 150 - 400 K/uL  Creatinine, serum     Status: Abnormal   Collection Time: 02/15/17  6:32 PM  Result Value Ref Range   Creatinine, Ser 1.15 0.61 - 1.24 mg/dL   GFR calc non Af Amer 59 (L) >60 mL/min   GFR calc Af Amer >60 >60 mL/min    Comment: (NOTE) The eGFR has been calculated using the CKD EPI equation. This calculation has not been validated in all clinical situations. eGFR's persistently <60 mL/min signify possible Chronic Kidney Disease.   CBC     Status: Abnormal   Collection Time: 02/16/17  4:38 AM  Result Value Ref Range   WBC 11.8 (H) 4.0 - 10.5 K/uL   RBC 3.93 (L) 4.22 - 5.81 MIL/uL   Hemoglobin 10.8 (L) 13.0 - 17.0 g/dL   HCT 33.4 (L) 39.0 - 52.0 %   MCV 85.0 78.0 - 100.0 fL   MCH 27.5 26.0 - 34.0 pg   MCHC 32.3 30.0 - 36.0 g/dL   RDW 15.4 11.5 - 15.5 %   Platelets 232 150 - 400 K/uL  Comprehensive metabolic panel     Status: Abnormal   Collection Time: 02/16/17  4:38 AM  Result Value Ref Range   Sodium 133 (L) 135 - 145 mmol/L   Potassium 4.6 3.5 - 5.1 mmol/L   Chloride 100 (L) 101 - 111 mmol/L   CO2 24 22 - 32 mmol/L   Glucose, Bld 257 (H) 65 - 99 mg/dL   BUN 18 6 - 20 mg/dL   Creatinine, Ser  1.18 0.61 - 1.24 mg/dL   Calcium 8.6 (L) 8.9 - 10.3 mg/dL   Total Protein 6.8 6.5 - 8.1 g/dL   Albumin 2.7 (L) 3.5 - 5.0 g/dL   AST 40 15 - 41 U/L   ALT 44 17 - 63 U/L   Alkaline Phosphatase 588 (H) 38 - 126 U/L   Total Bilirubin 0.4 0.3 - 1.2 mg/dL   GFR calc non Af Amer 57 (L) >60 mL/min   GFR calc Af Amer >60 >60 mL/min    Comment: (NOTE) The eGFR has been calculated using the CKD EPI equation. This calculation has not been validated in all clinical situations. eGFR's persistently <60 mL/min signify possible Chronic Kidney Disease.    Anion gap 9 5 - 15    Radiology/Results: No results found.  Anti-infectives: Anti-infectives (From admission, onward)   Start     Dose/Rate Route Frequency Ordered Stop   02/15/17 2200  cefoTEtan (CEFOTAN) 2 g in dextrose 5 % 50 mL IVPB     2 g 100 mL/hr over 30 Minutes Intravenous Every 12 hours 02/15/17 1817 02/15/17 2147   02/15/17 1145  cefoTEtan in Dextrose 5% (CEFOTAN) IVPB 2 g  Status:  Discontinued     2 g Intravenous On call to O.R. 02/15/17 1133 02/15/17 1141   02/15/17 1130  cefoTEtan in Dextrose 5% (CEFOTAN) IVPB 2 g     2 g Intravenous On call to O.R. 02/15/17 1130 02/15/17 1358      Assessment/Plan: Problem List: Patient Active Problem List   Diagnosis Date Noted  . Carcinoma ([place]) 02/15/2017  . Fistula of gallbladder 11/03/2016  . Proteinuria 01/06/2016  . Obstructive sleep apnea 10/04/2013  . Prediabetes 08/24/2012  . Pernicious anemia 08/24/2012  . BPH (benign prostatic hyperplasia) 05/26/2012  . Crohn's disease of small and large intestines ([place]) 06/23/2011  . Gout 06/23/2011  . Essential hypertension 06/23/2011  . Hyperlipemia 06/23/2011  . GERD (gastroesophageal reflux disease) 06/23/2011    Will request GI to do upper endo to rule out duodenal cancer growing in to the gallbladder.  2 Days Post-Op    LOS: 2 days   Matt B. Hassell Done, MD, [facility] [phone]  beeper [phone]  02/17/2017 10:02 AM

## 2017-02-18 ENCOUNTER — Encounter (HOSPITAL_COMMUNITY): Payer: Self-pay | Admitting: *Deleted

## 2017-02-18 ENCOUNTER — Encounter (HOSPITAL_COMMUNITY)
Admission: AD | Disposition: A | Discharge status: Skilled Nursing Facility | Payer: Self-pay | Source: Ambulatory Visit | Attending: Surgery

## 2017-02-18 ENCOUNTER — Inpatient Hospital Stay (HOSPITAL_COMMUNITY): Payer: Medicare Other | Admitting: Anesthesiology

## 2017-02-18 ENCOUNTER — Encounter
Admission: RE | Admit: 2017-02-18 | Discharge: 2017-02-18 | Disposition: A | Discharge status: Home / Self Care | Payer: Medicare Other | Source: Ambulatory Visit | Attending: Internal Medicine | Admitting: Internal Medicine

## 2017-02-18 HISTORY — PX: ESOPHAGOGASTRODUODENOSCOPY (EGD) WITH PROPOFOL: SHX5813

## 2017-02-18 SURGERY — ESOPHAGOGASTRODUODENOSCOPY (EGD) WITH PROPOFOL
Anesthesia: General

## 2017-02-18 MED ORDER — LIDOCAINE 2% (20 MG/ML) 5 ML SYRINGE
INTRAMUSCULAR | Status: DC | PRN
  Administered 2017-02-18: 100 mg via INTRAVENOUS

## 2017-02-18 MED ORDER — LACTATED RINGERS IV SOLN
INTRAVENOUS | Status: DC
  Administered 2017-02-18 (×2): via INTRAVENOUS

## 2017-02-18 MED ORDER — SODIUM CHLORIDE 0.9 % IV SOLN: INTRAVENOUS | Status: DC

## 2017-02-18 MED ORDER — PROPOFOL 10 MG/ML IV BOLUS
INTRAVENOUS | Status: DC | PRN
  Administered 2017-02-18: 50 mg via INTRAVENOUS

## 2017-02-18 MED ORDER — PROPOFOL 10 MG/ML IV BOLUS
INTRAVENOUS | Status: AC
  Filled 2017-02-18: qty 40

## 2017-02-18 MED ORDER — PROPOFOL 500 MG/50ML IV EMUL
INTRAVENOUS | Status: DC | PRN
  Administered 2017-02-18: 100 ug/kg/min via INTRAVENOUS

## 2017-02-18 SURGICAL SUPPLY — 15 items

## 2017-02-18 NOTE — Op Note (Signed)
[facility] Patient Name: Thomas David Procedure Date: 02/18/2017 MRN: [id number] Attending MD: Wonda Horner , MD Date of Birth: Nov 26, 1935 CSN: [id number] Age: 82 Admit Type: Inpatient Procedure:                Upper GI endoscopy Indications:              Abdominal pain Providers:                Wonda Horner, MD, Carmie End, RN, Danford Bad, Technician Referring MD:              Medicines:                Propofol per Anesthesia Complications:            No immediate complications. Estimated Blood Loss:     Estimated blood loss: none. Procedure:                Pre-Anesthesia Assessment:                           - Prior to the procedure, a History and Physical                            was performed, and patient medications and                            allergies were reviewed. The patient's tolerance of                            previous anesthesia was also reviewed. The risks                            and benefits of the procedure and the sedation                            options and risks were discussed with the patient.                            All questions were answered, and informed consent                            was obtained. Prior Anticoagulants: The patient has                            taken no previous anticoagulant or antiplatelet                            agents. ASA Grade Assessment: III - A patient with                            severe systemic disease. After reviewing the risks  and benefits, the patient was deemed in                            satisfactory condition to undergo the procedure.                           After obtaining informed consent, the endoscope was                            passed under direct vision. Throughout the                            procedure, the patient's blood pressure, pulse, and                            oxygen saturations were monitored  continuously. The                            [id number] ([id number]) scope was introduced through the                            mouth, and advanced to the second part of duodenum.                            The upper GI endoscopy was accomplished without                            difficulty. The patient tolerated the procedure                            well. Scope In: Scope Out: Findings:      The examined esophagus was normal.      Diffuse moderately erythematous mucosa was found in the stomach.      The examined duodenum was normal. Papilla of vater looks fine.       Peripapilary diverticulum. Impression:               - Normal esophagus.                           - Erythematous mucosa in the stomach.                           - Normal examined duodenum.                           - No specimens collected. Moderate Sedation:      . Recommendation:           - Resume regular diet.                           - As per surgery. Will sign off. CAll if needed.                           - Continue present medications. Procedure Code(s):        ---  Professional ---                           [phone], Esophagogastroduodenoscopy, flexible,                            transoral; diagnostic, including collection of                            specimen(s) by brushing or washing, when performed                            (separate procedure) Diagnosis Code(s):        --- Professional ---                           K31.89, Other diseases of stomach and duodenum                           R10.9, Unspecified abdominal pain CPT copyright 2016 American Medical Association. All rights reserved. The codes documented in this report are preliminary and upon coder review may  be revised to meet current compliance requirements. Wonda Horner, MD 02/18/2017 2:00:55 PM This report has been signed electronically. Number of Addenda: 0

## 2017-02-18 NOTE — H&P (Signed)
The patient was in the endoscopy unit today for EGD.  Physical:  No distress  Heart regular rhythm  Lungs clear.  Abdomen soft and nontender  Impression: History of gallbladder carcinoma. Rule out upper GI tract lesion  Plan EGD

## 2017-02-18 NOTE — NC FL2 (Signed)
[place] MEDICAID FL2 LEVEL OF CARE SCREENING TOOL     IDENTIFICATION  Patient Name: Thomas David Birthdate: [birth year] Sex: male Admission Date (Current Location): 02/15/2017  [facility] and [place] Number:  Herbalist and Address:  [facility],  [place] [address], [place]      Provider Number: [id number]  Attending Physician Name and Address:  Johnathan Hausen, MD  Relative Name and Phone Number:       Current Level of Care: Hospital Recommended Level of Care:   Prior Approval Number:    Date Approved/Denied:   PASRR Number:    Discharge Plan:      Current Diagnoses: Patient Active Problem List   Diagnosis Date Noted  . Carcinoma ([place]) 02/15/2017  . Fistula of gallbladder 11/03/2016  . Proteinuria 01/06/2016  . Obstructive sleep apnea 10/04/2013  . Prediabetes 08/24/2012  . Pernicious anemia 08/24/2012  . BPH (benign prostatic hyperplasia) 05/26/2012  . Crohn's disease of small and large intestines ([place]) 06/23/2011  . Gout 06/23/2011  . Essential hypertension 06/23/2011  . Hyperlipemia 06/23/2011  . GERD (gastroesophageal reflux disease) 06/23/2011    Orientation RESPIRATION BLADDER Height & Weight     Self, Time, Situation, Place  Normal Continent Weight: 198 lb (89.8 kg) Height:  5' 9"  (175.3 cm)  BEHAVIORAL SYMPTOMS/MOOD NEUROLOGICAL BOWEL NUTRITION STATUS      Continent Diet(See Discharge Summary)  AMBULATORY STATUS COMMUNICATION OF NEEDS Skin     Verbally Other (Comment)(Abdomen Surgical Incision)                       Personal Care Assistance Level of Assistance  Bathing, Feeding, Dressing Bathing Assistance: Independent Feeding assistance: Independent Dressing Assistance: Independent     Functional Limitations Info  Sight, Hearing, Speech Sight Info: Impaired Hearing Info: Adequate Speech Info: Adequate    SPECIAL CARE FACTORS FREQUENCY  PT (By licensed PT)     PT Frequency: 5x/week               Contractures Contractures Info: Not present    Additional Factors Info  Code Status, Allergies Code Status Info: Fullcode Allergies Info: Allergies: No Known Allergies           Current Medications (02/18/2017):  This is the current hospital active medication list Current Facility-Administered Medications  Medication Dose Route Frequency Provider Last Rate Last Dose  . allopurinol (ZYLOPRIM) tablet 300 mg  300 mg Oral q morning - 10a Johnathan Hausen, MD   300 mg at 02/17/17 2956  . amLODipine (NORVASC) tablet 5 mg  5 mg Oral Daily Johnathan Hausen, MD   5 mg at 02/17/17 0953  . dextrose 5 % and 0.45 % NaCl with KCl 20 mEq/L infusion   Intravenous Continuous Johnathan Hausen, MD 75 mL/hr at 02/18/17 0049    . fluticasone (FLONASE) 50 MCG/ACT nasal spray 1 spray  1 spray Each Nare QHS Minda Ditto, RPH   1 spray at 02/17/17 2139  . heparin injection 5,000 Units  5,000 Units Subcutaneous Q8H Johnathan Hausen, MD   5,000 Units at 02/18/17 [phone]  . HYDROcodone-acetaminophen (NORCO/VICODIN) 5-325 MG per tablet 1-2 tablet  1-2 tablet Oral Q4H PRN Johnathan Hausen, MD   1 tablet at 02/18/17 [phone]  . losartan (COZAAR) tablet 100 mg  100 mg Oral Daily Johnathan Hausen, MD   100 mg at 02/17/17 0953  . metoprolol tartrate (LOPRESSOR) injection 5 mg  5 mg Intravenous Q6H PRN Johnathan Hausen, MD      .  metoprolol tartrate (LOPRESSOR) tablet 50 mg  50 mg Oral BID Johnathan Hausen, MD   50 mg at 02/17/17 2139  . morphine 2 MG/ML injection 1 mg  1 mg Intravenous Q1H PRN Johnathan Hausen, MD      . ondansetron (ZOFRAN-ODT) disintegrating tablet 4 mg  4 mg Oral Q6H PRN Johnathan Hausen, MD       Or  . ondansetron [facility]) injection 4 mg  4 mg Intravenous Q6H PRN Johnathan Hausen, MD      . pantoprazole (PROTONIX) injection 40 mg  40 mg Intravenous QHS Johnathan Hausen, MD   40 mg at 02/17/17 2139     Discharge Medications: Please see discharge summary for a list of discharge medications.  Relevant Imaging  Results:  Relevant Lab Results:   Additional Information ssn:[phone]  Lia Hopping, LCSW

## 2017-02-18 NOTE — Transfer of Care (Signed)
Immediate Anesthesia Transfer of Care Note  Patient: Thomas David  Procedure(s) Performed: ESOPHAGOGASTRODUODENOSCOPY (EGD) WITH PROPOFOL (N/A )  Patient Location: PACU and Endoscopy Unit  Anesthesia Type:MAC  Level of Consciousness: awake, alert  and oriented  Airway & Oxygen Therapy: Patient Spontanous Breathing  Post-op Assessment: Report given to RN and Post -op Vital signs reviewed and stable  Post vital signs: Reviewed and stable  Last Vitals:  Vitals:   02/18/17 1245 02/18/17 1401  BP: (!) 187/94 140/75  Pulse: 89 87  Resp: 17 17  Temp: 36.7 C   SpO2: 97% 97%    Last Pain:  Vitals:   02/18/17 1401  TempSrc: Oral  PainSc:       Patients Stated Pain Goal: 2 (28/63/81 7711)  Complications: No apparent anesthesia complications

## 2017-02-18 NOTE — Anesthesia Preprocedure Evaluation (Signed)
Anesthesia Evaluation  Patient identified by MRN, date of birth, ID band Patient awake    Reviewed: Allergy & Precautions, H&P , NPO status , Patient's Chart, lab work & pertinent test results, reviewed documented beta blocker date and time   History of Anesthesia Complications (+) DIFFICULT AIRWAY  Airway Mallampati: III  TM Distance: >3 FB Neck ROM: Full    Dental  (+) Dental Advisory Given, Missing, Caps   Pulmonary neg pulmonary ROS, sleep apnea and Continuous Positive Airway Pressure Ventilation ,    Pulmonary exam normal breath sounds clear to auscultation       Cardiovascular hypertension, Pt. on medications and Pt. on home beta blockers Normal cardiovascular exam Rhythm:Regular Rate:Normal     Neuro/Psych PSYCHIATRIC DISORDERS Anxiety Depression negative neurological ROS     GI/Hepatic Neg liver ROS, GERD  Medicated and Controlled,GB Ca Crohn's disease   Endo/Other  diabetes, Type 2Hyperlipidemia  Renal/GU Renal diseaseHx/o renal caclculi  negative genitourinary   Musculoskeletal  (+) Arthritis , Osteoarthritis,    Abdominal (+) + obese,   Peds  Hematology  (+) anemia ,   Anesthesia Other Findings   Reproductive/Obstetrics                             Anesthesia Physical  Anesthesia Plan  ASA: III  Anesthesia Plan: General   Post-op Pain Management:    Induction: Intravenous  PONV Risk Score and Plan: 2 and Ondansetron, Treatment may vary due to age or medical condition, Propofol infusion and Midazolam  Airway Management Planned: Natural Airway and Nasal Cannula  Additional Equipment:   Intra-op Plan:   Post-operative Plan:   Informed Consent: I have reviewed the patients History and Physical, chart, labs and discussed the procedure including the risks, benefits and alternatives for the proposed anesthesia with the patient or authorized representative who has  indicated his/her understanding and acceptance.   Dental advisory given  Plan Discussed with: CRNA, Anesthesiologist and Surgeon  Anesthesia Plan Comments:         Anesthesia Quick Evaluation

## 2017-02-18 NOTE — Progress Notes (Signed)
Pt has declined use of CPAP during hospital stay.  RT to monitor and assess as needed.  

## 2017-02-18 NOTE — Progress Notes (Deleted)
Discharge planning, spoke with patient and son at beside. Chose [facility] for [facility] services, RN, PT, OT to eval and treat. Contacted [facility] for referral. Has a RW and elevated toilets with arms at home. Unsure when she will d/c, [facility] to follow. [phone]

## 2017-02-18 NOTE — Progress Notes (Addendum)
Patient ID: DEMON VOLANTE, male   DOB: [birth year], 82 y.o.   MRN: [id number] [facility] Progress Note:   3 Days Post-Op  Subjective: Mental status is clear.   Objective: Vital signs in last 24 hours: Temp:  [97.5 F (36.4 C)-97.7 F (36.5 C)] 97.7 F (36.5 C) (01/17 0646) Pulse Rate:  [61-64] 63 (01/17 0646) Resp:  [16-17] 16 (01/17 0646) BP: (130-172)/(71-78) 172/78 (01/17 0646) SpO2:  [90 %-98 %] 90 % (01/17 0646)  Intake/Output from previous day: 01/16 0701 - 01/17 0700 In: 2055 [P.O.:480; I.V.:1575] Out: 2245 [Urine:2175; Drains:70] Intake/Output this shift: Total I/O In: -  Out: 30 [Drains:30]  Physical Exam: Work of breathing is normal.  JP minimal drainage.    Lab Results:  No results found for this or any previous visit (from the past 48 hour(s)).  Radiology/Results: No results found.  Anti-infectives: Anti-infectives (From admission, onward)   Start     Dose/Rate Route Frequency Ordered Stop   02/15/17 2200  cefoTEtan (CEFOTAN) 2 g in dextrose 5 % 50 mL IVPB     2 g 100 mL/hr over 30 Minutes Intravenous Every 12 hours 02/15/17 1817 02/15/17 2147   02/15/17 1145  cefoTEtan in Dextrose 5% (CEFOTAN) IVPB 2 g  Status:  Discontinued     2 g Intravenous On call to O.R. 02/15/17 1133 02/15/17 1141   02/15/17 1130  cefoTEtan in Dextrose 5% (CEFOTAN) IVPB 2 g     2 g Intravenous On call to O.R. 02/15/17 1130 02/15/17 1358      Assessment/Plan: Problem List: Patient Active Problem List   Diagnosis Date Noted  . Carcinoma ([place]) 02/15/2017  . Fistula of gallbladder 11/03/2016  . Proteinuria 01/06/2016  . Obstructive sleep apnea 10/04/2013  . Prediabetes 08/24/2012  . Pernicious anemia 08/24/2012  . BPH (benign prostatic hyperplasia) 05/26/2012  . Crohn's disease of small and large intestines ([place]) 06/23/2011  . Gout 06/23/2011  . Essential hypertension 06/23/2011  . Hyperlipemia 06/23/2011  . GERD (gastroesophageal reflux disease) 06/23/2011     Path shows carcinoma of the gallbladder.  Upper endoscopy to see if growing into duodenum.  Plan return to [facility] in [place] tomorrow.  Will seek Med Onc at [facility]   3 Days Post-Op    LOS: 3 days   Matt B. Hassell Done, MD, [facility] [phone] beeper [phone]  02/18/2017 8:19 AM

## 2017-02-18 NOTE — Anesthesia Postprocedure Evaluation (Signed)
Anesthesia Post Note  Patient: Thomas David  Procedure(s) Performed: ESOPHAGOGASTRODUODENOSCOPY (EGD) WITH PROPOFOL (N/A )     Patient location during evaluation: PACU Anesthesia Type: General Level of consciousness: awake and alert Pain management: pain level controlled Vital Signs Assessment: post-procedure vital signs reviewed and stable Respiratory status: spontaneous breathing, nonlabored ventilation and respiratory function stable Cardiovascular status: stable and blood pressure returned to baseline Postop Assessment: no apparent nausea or vomiting Anesthetic complications: no    Last Vitals:  Vitals:   02/18/17 1245 02/18/17 1401  BP: (!) 187/94 140/75  Pulse: 89 87  Resp: 17 17  Temp: 36.7 C 36.4 C  SpO2: 97% 97%    Last Pain:  Vitals:   02/18/17 1401  TempSrc: Oral  PainSc:                  Kalman Nylen A.

## 2017-02-18 NOTE — Care Management Important Message (Signed)
Important Message  Patient Details  Name: [name] MRN: [id number] Date of Birth: 08-31-1935   Medicare Important Message Given:  Yes    Kerin Salen 02/18/2017, 11:02 AMImportant Message  Patient Details  Name: [name] MRN: [id number] Date of Birth: [birth year]   Medicare Important Message Given:  Yes    Kerin Salen 02/18/2017, 11:02 AM

## 2017-02-19 ENCOUNTER — Encounter (HOSPITAL_COMMUNITY): Payer: Self-pay | Admitting: Gastroenterology

## 2017-02-19 DIAGNOSIS — E785 Hyperlipidemia, unspecified: Secondary | ICD-10-CM | POA: Diagnosis not present

## 2017-02-19 DIAGNOSIS — M109 Gout, unspecified: Secondary | ICD-10-CM | POA: Diagnosis not present

## 2017-02-19 DIAGNOSIS — N400 Enlarged prostate without lower urinary tract symptoms: Secondary | ICD-10-CM | POA: Diagnosis not present

## 2017-02-19 DIAGNOSIS — Z483 Aftercare following surgery for neoplasm: Secondary | ICD-10-CM | POA: Diagnosis not present

## 2017-02-19 DIAGNOSIS — I10 Essential (primary) hypertension: Secondary | ICD-10-CM | POA: Diagnosis not present

## 2017-02-19 DIAGNOSIS — Z7982 Long term (current) use of aspirin: Secondary | ICD-10-CM | POA: Diagnosis not present

## 2017-02-19 DIAGNOSIS — Z4803 Encounter for change or removal of drains: Secondary | ICD-10-CM | POA: Diagnosis not present

## 2017-02-19 DIAGNOSIS — M6281 Muscle weakness (generalized): Secondary | ICD-10-CM | POA: Diagnosis not present

## 2017-02-19 DIAGNOSIS — C23 Malignant neoplasm of gallbladder: Secondary | ICD-10-CM | POA: Diagnosis present

## 2017-02-19 DIAGNOSIS — K5090 Crohn's disease, unspecified, without complications: Secondary | ICD-10-CM | POA: Diagnosis not present

## 2017-02-19 DIAGNOSIS — K219 Gastro-esophageal reflux disease without esophagitis: Secondary | ICD-10-CM | POA: Diagnosis not present

## 2017-02-19 MED ORDER — HYDROCODONE-ACETAMINOPHEN 5-325 MG PO TABS: 1.0000 | ORAL_TABLET | ORAL | 0 refills | Status: DC | PRN

## 2017-02-19 NOTE — Discharge Summary (Signed)
Physician Discharge Summary  Patient ID: Thomas David MRN: [id number] DOB/AGE: [birth year] 80 y.o.  Admit date: 02/15/2017 Discharge date: 02/19/2017  Admission Diagnoses:  Chronic cholecystitis  Discharge Diagnoses:  Gallbladder cancer  Principal Problem:   Gallbladder cancer [facility])   Surgery:  Laparoscopy, partial cholecystectomy; biopsy of hard mass in the neck of gallbladder consistent with gallbladder cancer  Discharged Condition: stable  Hospital Course:   Had surgery with placement of JP drain.  Endoscopy by Dr. Penelope Coop did not show invasion of the duodenum.  He is ready for discharge and arrangements are pending for med oncology consult at [place]  Significant Diagnostic Studies: upper endo with findings above    Discharge Exam: Blood pressure (!) 166/78, pulse 64, temperature 97.8 F (36.6 C), temperature source Oral, resp. rate 18, height 5' 9"  (1.753 m), weight 89.8 kg (198 lb), SpO2 98 %. JP with serosanguinous drainage  Disposition: 03-Skilled Nursing Facility  Discharge Instructions    Call MD for:  persistant nausea and vomiting   Complete by:  As directed    Diet - low sodium heart healthy   Complete by:  As directed    Discharge instructions   Complete by:  As directed    Drain and recharge JP daily May advance diet to regular   Increase activity slowly   Complete by:  As directed      Allergies as of 02/19/2017   No Known Allergies     Medication List    TAKE these medications   acetaminophen 500 MG tablet Commonly known as:  TYLENOL Take 1,000 mg by mouth 2 (two) times daily as needed for moderate pain or headache.   allopurinol 300 MG tablet Commonly known as:  ZYLOPRIM Take 1 tablet (300 mg total) by mouth every morning.   amLODipine 5 MG tablet Commonly known as:  NORVASC Take 1 tablet (5 mg total) by mouth daily.   amoxicillin-clavulanate 875-125 MG tablet Commonly known as:  AUGMENTIN Take 1 tablet by mouth every  12 (twelve) hours.   aspirin EC 81 MG tablet Take 81 mg by mouth at bedtime.   AUSTRALIAN DREAM ARTHRITIS EX Apply 1 application topically daily as needed (back pain).   bisacodyl 10 MG suppository Commonly known as:  DULCOLAX Place 1 suppository (10 mg total) rectally daily as needed for moderate constipation.   budesonide 3 MG 24 hr capsule Commonly known as:  ENTOCORT EC TAKE 1 CAPSULE BY MOUTH  DAILY   fluticasone 50 MCG/ACT nasal spray Commonly known as:  FLONASE USE 1 SPRAY INTO BOTH  NOSTRILS DAILY What changed:  See the new instructions.   HYDROcodone-acetaminophen 5-325 MG tablet Commonly known as:  NORCO/VICODIN Take 1-2 tablets by mouth every 4 (four) hours as needed for moderate pain.   losartan 100 MG tablet Commonly known as:  COZAAR Take 1 tablet (100 mg total) by mouth daily.   mesalamine 500 MG CR capsule Commonly known as:  PENTASA Take 1 capsule (500 mg total) by mouth 4 (four) times daily. What changed:    how much to take  when to take this   metoprolol tartrate 50 MG tablet Commonly known as:  LOPRESSOR Take 1 tablet (50 mg total) by mouth 2 (two) times daily. What changed:  when to take this   multivitamin with minerals Tabs tablet Take 2 tablets by mouth daily.   omeprazole 20 MG capsule Commonly known as:  PRILOSEC TAKE 1 CAPSULE BY MOUTH  DAILY  oxybutynin 5 MG 24 hr tablet Commonly known as:  DITROPAN-XL TAKE 1 TABLET BY MOUTH AT  BEDTIME   PHILLIPS COLON HEALTH PO Take 1 tablet by mouth daily with supper.   simvastatin 20 MG tablet Commonly known as:  ZOCOR Take 1 tablet (20 mg total) by mouth daily.   vitamin B-12 1000 MCG tablet Commonly known as:  CYANOCOBALAMIN Take 1,000 mcg by mouth 2 (two) times daily.       Contact information for follow-up providers    Leone Haven, MD. Schedule an appointment as soon as possible for a visit in 1 week(s).   Specialty:  Family Medicine Contact information: [address] [place] [address] [phone]        Johnathan Hausen, MD. Schedule an appointment as soon as possible for a visit in 1 week(s).   Specialty:  General Surgery Contact information: [place] [address] [phone]            Contact information for after-discharge care    Destination    [facility] Follow up.   Service:  Skilled Nursing Contact information: [address] [place] [phone]                  Signed: Pedro Earls 02/19/2017, 7:33 AM

## 2017-02-19 NOTE — Discharge Instructions (Signed)
JP Drain Totals °· Bring this sheet to all of your post-operative appointments while you have your drains. °· Please measure your drains by CC's or ML's. °· Make sure you drain and measure your JP Drains 3 times per day. °· At the end of each day, add up totals for the left side and add up totals for the right side. °   ( 9 am )     ( 3 pm )        ( 9 pm )                °Date L  R  L  R  L  R  Total L/R  °               °               °               °               °               °               °               °               °               °               °               °               ° °

## 2017-02-19 NOTE — Clinical Social Work Placement (Signed)
D/C Summary Sent Patient will transport via [facility].   CLINICAL SOCIAL WORK PLACEMENT  NOTE  Date:  02/19/2017  Patient Details  Name: Thomas David MRN: [id number] Date of Birth: January 08, 1936  Clinical Social Work is seeking post-discharge placement for this patient at the [place] level of care (*CSW will initial, date and re-position this form in  chart as items are completed):  Yes   Patient/family provided with [place] Work Department's list of facilities offering this level of care within the geographic area requested by the patient (or if unable, by the patient's family).  Yes   Patient/family informed of their freedom to choose among providers that offer the needed level of care, that participate in Medicare, Medicaid or managed care program needed by the patient, have an available bed and are willing to accept the patient.  Yes   Patient/family informed of [facility]'s ownership interest in [facility] and [facility], as well as of the fact that they are under no obligation to receive care at these facilities.  PASRR submitted to EDS on       PASRR number received on       Existing PASRR number confirmed on       FL2 transmitted to all facilities in geographic area requested by pt/family on       FL2 transmitted to all facilities within larger geographic area on       Patient informed that his/her managed care company has contracts with or will negotiate with certain facilities, including the following:  [place]     Yes   Patient/family informed of bed offers received.  Patient chooses bed at [facility]     Physician recommends and patient chooses bed at      Patient to be transferred to [facility] on 02/19/17.  Patient to be transferred to facility by [facility]     Patient family notified on   of transfer.  Name of family member notified:  Patient to notify Adult Children      PHYSICIAN       Additional  Comment:    _______________________________________________ Lia Hopping, [place] 02/19/2017, 9:59 AM

## 2017-02-19 NOTE — Progress Notes (Signed)
Chaplain provided support around progression of illness.  Thomas David's spouse died in October 21, 2022 of cancer.  Speaks with chaplain about thoughts around end of life, goals and values.  Tearful at times, he described not wishing aggressive treatment.  He is hopeful to have continued conversation with medical team as he discovers next steps.  He is christian and values prayer.  Well supported by family and chaplain at retirement facility.  He expresses grief around potentially not seeing his 88.68 year old great grandson grow up.

## 2017-02-22 ENCOUNTER — Telehealth: Payer: Self-pay

## 2017-02-22 NOTE — Telephone Encounter (Signed)
You can place him on the schedule on 1/31 at noon.  Thank you.

## 2017-02-22 NOTE — Telephone Encounter (Signed)
Patient need HFU appointment has one to establish care 2/19 but HFU needs sooner appt. I se no opening at this time.

## 2017-02-22 NOTE — Telephone Encounter (Signed)
Copied from [place] [phone]. Topic: General - Other >> Feb 22, 2017  9:20 AM Thomas David wrote: Reason for CRM: Patient called b/c he was advised to come in sooner to see his PCP after his surgeries. He will be new to seeing Dr. Caryl Bis transferring from [place].   He confirmed that he is in skilled nursing at [facility] at [place] and can be discharged from it as early as today. He says he's able and mobile and has already been home.   I spoke to [employer] LPN. RE getting in him for sooner appt w/ Caryl Bis and she advised me to send a CRM and she'd follow up with him. Patient says he will stay at [facility] until Wednesday.

## 2017-02-23 NOTE — Telephone Encounter (Signed)
Pt returned call - please call [phone]

## 2017-02-23 NOTE — Telephone Encounter (Signed)
Second attempt for TCM made today left message for patient to please call office.

## 2017-02-23 NOTE — Telephone Encounter (Signed)
Please advise 

## 2017-02-24 NOTE — Telephone Encounter (Signed)
When I went to make this appointment that you had given that slot was taken I scheduled at 1230 if this is not ok let me know where to place patient he really needs to be seen with in 14 days of the 18 th which was his discharge.

## 2017-02-24 NOTE — Telephone Encounter (Addendum)
Transition Care Management Follow-up Telephone Call  How have you been since you were released from the hospital? Feeling better since out of the hospital , still very weak.   Do you understand why you were in the hospital? yes   Do you understand the discharge instrcutions? yes  Items Reviewed:  Medications reviewed: yes  Allergies reviewed: yes  Dietary changes reviewed: yes  Referrals reviewed: yes   Functional Questionnaire:   Activities of Daily Living (ADLs):   He states they are independent in the following: ambulation, bathing and hygiene, feeding, continence, grooming, toileting and dressing States they require assistance with the following: No assistance needed    Any transportation issues/concerns?: no   Any patient concerns? Recent DX of cancer of gall bladder has questions .    Confirmed importance and date/time of follow-up visits scheduled: yes   Confirmed with patient if condition begins to worsen call PCP or go to the ER.  Patient was given the Call-a-Nurse line [phone]: yes

## 2017-02-28 NOTE — Progress Notes (Signed)
[place]  [phone]) [phone] [phone]  ID: Thomas David OB: May 01, 1935  MR#: [id number]  TDS#:[id number]  Patient Care Team: Leone Haven, MD as PCP - General (Family Medicine) Kathyrn Drown, MD (Family Medicine) Clent Jacks, RN as Registered Nurse  CHIEF COMPLAINT: Adenocarcinoma of gallbladder.  INTERVAL HISTORY: Patient is a 82 year old male who is having intermittent abdominal pain for several months in 2018.  This was thought to be related to his gallbladder and he underwent surgery in October, but did not remove the gallbladder secondary to a significant amount of inflammation.  A drain was placed at that time.  Patient eventually underwent cholecystectomy in December 2018 is revealed to have adenocarcinoma.  He was also noted to have positive margins with disease on his serosal surface.  Currently, he continues to have intermittent pain but otherwise feels well.  He has no neurologic complaints.  He denies any recent fevers or illnesses.  He has a poor appetite, but denies weight loss.  He has intermittent nausea, but denies any vomiting, constipation, or diarrhea.  He has no urinary complaints.  Patient otherwise feels well and offers no further specific complaints.  REVIEW OF SYSTEMS:   Review of Systems  Constitutional: Positive for malaise/fatigue. Negative for fever and weight loss.  Respiratory: Negative.  Negative for cough and shortness of breath.   Cardiovascular: Negative for chest pain and leg swelling.  Gastrointestinal: Positive for abdominal pain and nausea.  Genitourinary: Negative for dysuria.  Musculoskeletal: Negative.   Skin: Negative.  Negative for rash.  Neurological: Positive for weakness.  Psychiatric/Behavioral: The patient is nervous/anxious.     As per HPI. Otherwise, a complete review of systems is negative.  PAST MEDICAL HISTORY: Past Medical History:  Diagnosis Date  . Anxiety   . Arthritis   .  Chronic lower back pain   . Crohn's disease ([place])   . Depression   . Difficult intubation    patient denies on 02/12/17  . GERD (gastroesophageal reflux disease)   . Gout   . History of kidney stones 40 years ago  . Hypercholesteremia   . Hypertension   . Prediabetes   . Sleep apnea    cpap     PAST SURGICAL HISTORY: Past Surgical History:  Procedure Laterality Date  . CHOLECYSTECTOMY N/A 11/03/2016   Procedure: LAPAROSCOPIC CHOLECYSTostomy with tube placement ;  Surgeon: Johnathan Hausen, MD;  Location: [facility];  Service: General;  Laterality: N/A;  . CHOLECYSTECTOMY N/A 02/15/2017   Procedure: LAPAROSCOPIC SUBTOTAL CHOLECYSTECTOMY, BIOPSY OF GALLBLADDER;  Surgeon: Johnathan Hausen, MD;  Location: [facility];  Service: General;  Laterality: N/A;  . COLECTOMY  1973   "part of large intestines" (05/04/2012)  . COLON RESECTION    . COLONOSCOPY    . COLONOSCOPY N/A 05/23/2014   Procedure: COLONOSCOPY;  Surgeon: Rogene Houston, MD;  [facility];  Service: Endoscopy;  Laterality: N/A;  1200  . ESOPHAGEAL DILATION N/A 05/23/2014   Procedure: ESOPHAGEAL DILATION;  Surgeon: Rogene Houston, MD;  [facility];  Service: Endoscopy;  Laterality: N/A;  . ESOPHAGOGASTRODUODENOSCOPY N/A 03/16/2014   Procedure: esophageal dilation ;  Surgeon: Rogene Houston, MD;  [facility];  Service: Endoscopy;  Laterality: N/A;  . ESOPHAGOGASTRODUODENOSCOPY N/A 05/23/2014   Procedure: ESOPHAGOGASTRODUODENOSCOPY (EGD);  Surgeon: Rogene Houston, MD;  [facility];  Service: Endoscopy;  Laterality: N/A;  . ESOPHAGOGASTRODUODENOSCOPY (EGD) WITH PROPOFOL N/A 02/18/2017   Procedure: ESOPHAGOGASTRODUODENOSCOPY (EGD)  WITH PROPOFOL;  Surgeon: Wonda Horner, MD;  Location: [facility];  Service: Endoscopy;  Laterality: N/A;  . [place]  . IR CHOLANGIOGRAM EXISTING TUBE  11/26/2016  . KIDNEY STONE SURGERY  1970's  . PARTIAL COLECTOMY  ~ 40 years ago   some of large  and small  . PROSTATE SURGERY    . THYROIDECTOMY Right 05/04/2012   Procedure: RIGHT HEMI THYROIDECTOMY;  Surgeon: Ascencion Dike, MD;  Location: [facility];  Service: ENT;  Laterality: Right;  . THYROIDECTOMY    . TONSILLECTOMY AND ADENOIDECTOMY     as a child  . TRANSURETHRAL PROSTATECTOMY WITH GYRUS INSTRUMENTS N/A 07/11/2012   Procedure: TRANSURETHRAL PROSTATECTOMY WITH GYRUS INSTRUMENTS;  Surgeon: Claybon Jabs, MD;  Location: [facility];  Service: Urology;  Laterality: N/A;  . UPPER GASTROINTESTINAL ENDOSCOPY      FAMILY HISTORY: Family History  Problem Relation Age of Onset  . Diabetes Mother   . Stroke Father   . Healthy Daughter   . Healthy Son   . Heart disease Sister     ADVANCED DIRECTIVES (Y/N):  N  HEALTH MAINTENANCE: Social History   Tobacco Use  . Smoking status: Never Smoker  . Smokeless tobacco: Never Used  Substance Use Topics  . Alcohol use: No    Alcohol/week: 0.0 oz  . Drug use: No     Colonoscopy:  PAP:  Bone density:  Lipid panel:  No Known Allergies  Current Outpatient Medications  Medication Sig Dispense Refill  . acetaminophen (TYLENOL) 500 MG tablet Take 1,000 mg by mouth 2 (two) times daily as needed for moderate pain or headache.    . allopurinol (ZYLOPRIM) 300 MG tablet Take 1 tablet (300 mg total) by mouth every morning. 90 tablet 3  . amLODipine (NORVASC) 5 MG tablet Take 1 tablet (5 mg total) by mouth daily. 90 tablet 3  . aspirin EC 81 MG tablet Take 81 mg by mouth at bedtime.    . fluticasone (FLONASE) 50 MCG/ACT nasal spray USE 1 SPRAY INTO BOTH  NOSTRILS DAILY (Patient taking differently: USE 1 SPRAY INTO BOTH  NOSTRILS AT NIGHT AS NEEDED FOR CONGESTION) 32 g 0  . losartan (COZAAR) 100 MG tablet Take 1 tablet (100 mg total) by mouth daily. 90 tablet 3  . mesalamine (PENTASA) 500 MG CR capsule Take 1 capsule (500 mg total) by mouth 4 (four) times daily. (Patient taking differently: Take 1,000 mg by mouth 2 (two) times daily. ) 360 capsule 3  .  metoprolol (LOPRESSOR) 50 MG tablet Take 1 tablet (50 mg total) by mouth 2 (two) times daily. (Patient taking differently: Take 50 mg by mouth daily. ) 180 tablet 3  . Multiple Vitamin (MULTIVITAMIN WITH MINERALS) TABS tablet Take 2 tablets by mouth daily.    Marland Kitchen omeprazole (PRILOSEC) 20 MG capsule TAKE 1 CAPSULE BY MOUTH  DAILY 90 capsule 3  . oxybutynin (DITROPAN-XL) 5 MG 24 hr tablet TAKE 1 TABLET BY MOUTH AT  BEDTIME 90 tablet 1  . Probiotic Product (PHILLIPS COLON HEALTH PO) Take 1 tablet by mouth daily with supper.     . simvastatin (ZOCOR) 20 MG tablet Take 1 tablet (20 mg total) by mouth daily. 90 tablet 3  . vitamin B-12 (CYANOCOBALAMIN) 1000 MCG tablet Take 1,000 mcg by mouth 2 (two) times daily.    Marland Kitchen amoxicillin-clavulanate (AUGMENTIN) 875-125 MG tablet Take 1 tablet by mouth every 12 (twelve) hours. (Patient not taking: Reported on 02/12/2017) 20 tablet 1  .  bisacodyl (DULCOLAX) 10 MG suppository Place 1 suppository (10 mg total) rectally daily as needed for moderate constipation. (Patient not taking: Reported on 03/02/2017) 12 suppository 0  . budesonide (ENTOCORT EC) 3 MG 24 hr capsule TAKE 1 CAPSULE BY MOUTH  DAILY (Patient not taking: Reported on 02/12/2017) 90 capsule 0  . Histamine Dihydrochloride (AUSTRALIAN DREAM ARTHRITIS EX) Apply 1 application topically daily as needed (back pain).    Marland Kitchen HYDROcodone-acetaminophen (NORCO/VICODIN) 5-325 MG tablet Take 1-2 tablets by mouth every 4 (four) hours as needed for moderate pain. (Patient not taking: Reported on 03/02/2017) 30 tablet 0  . lidocaine-prilocaine (EMLA) cream Apply to affected area once 30 g 3  . ondansetron (ZOFRAN) 8 MG tablet Take 1 tablet (8 mg total) by mouth 2 (two) times daily as needed. 30 tablet 2  . prochlorperazine (COMPAZINE) 10 MG tablet Take 1 tablet (10 mg total) by mouth every 6 (six) hours as needed (Nausea or vomiting). 60 tablet 2   No current facility-administered medications for this visit.      OBJECTIVE: Vitals:   03/02/17 1350  BP: 96/61  Pulse: 94  Resp: 18  Temp: (!) 96.7 F (35.9 C)     Body mass index is 29.46 kg/m.    ECOG FS:1 - Symptomatic but completely ambulatory  General: Well-developed, well-nourished, no acute distress. Eyes: Pink conjunctiva, anicteric sclera. HEENT: Normocephalic, moist mucous membranes, clear oropharnyx. Lungs: Clear to auscultation bilaterally. Heart: Regular rate and rhythm. No rubs, murmurs, or gallops. Abdomen: Soft, nontender, nondistended. No organomegaly noted, normoactive bowel sounds. Musculoskeletal: No edema, cyanosis, or clubbing. Neuro: Alert, answering all questions appropriately. Cranial nerves grossly intact. Skin: No rashes or petechiae noted. Psych: Normal affect. Lymphatics: No cervical, calvicular, axillary or inguinal LAD.   LAB RESULTS:  Lab Results  Component Value Date   NA 133 (L) 02/16/2017   K 4.6 02/16/2017   CL 100 (L) 02/16/2017   CO2 24 02/16/2017   GLUCOSE 257 (H) 02/16/2017   BUN 18 02/16/2017   CREATININE 1.18 02/16/2017   CALCIUM 8.6 (L) 02/16/2017   PROT 6.8 02/16/2017   ALBUMIN 2.7 (L) 02/16/2017   AST 40 02/16/2017   ALT 44 02/16/2017   ALKPHOS 588 (H) 02/16/2017   BILITOT 0.4 02/16/2017   GFRNONAA 57 (L) 02/16/2017   GFRAA >60 02/16/2017    Lab Results  Component Value Date   WBC 11.8 (H) 02/16/2017   NEUTROABS 9.8 (H) 11/05/2016   HGB 10.8 (L) 02/16/2017   HCT 33.4 (L) 02/16/2017   MCV 85.0 02/16/2017   PLT 232 02/16/2017     STUDIES: Dg Colon W/cm - Wo/w Kub  Result Date: 02/05/2017 CLINICAL DATA:  Abdominal pain. Remote history of Crohn's disease and bowel surgery. EXAM: SINGLE COLUMN BARIUM ENEMA TECHNIQUE: Initial scout AP supine abdominal image obtained to insure adequate colon cleansing. Barium was introduced into the colon in a retrograde fashion and refluxed from the rectum to the cecum. Spot images of the colon followed by overhead radiographs were obtained.  FLUOROSCOPY TIME:  Fluoroscopy Time:  2 minutes and 42 seconds Radiation Exposure Index (if provided by the fluoroscopic device): 469 mGy Number of Acquired Spot Images: 9 COMPARISON:  CT scan 09/17/2016 FINDINGS: Moderate sigmoid colon diverticulosis. No fixed filling defects to suggest large polyps or masses. No constrictive lesion or extrinsic compression. Surgical changes from a partial right colectomy with ileocolonic anastomosis near the hepatic flexure. Reflux into the small bowel is noted. IMPRESSION: Unremarkable single column barium enema study. No worrisome  colonic lesions. Electronically Signed   By: Marijo Sanes M.D.   On: 02/05/2017 09:51    ASSESSMENT: Adenocarcinoma of gallbladder.  PLAN:    1. Adenocarcinoma of gallbladder: At least stage IIIA.  Will get a PET scan in mid February to complete the staging workup.  Patient will benefit from palliative chemotherapy using gemcitabine and cisplatin on day 1 with gemcitabine only on days 8 and 15.  This will be a 28-day cycle.  Prior to initiating treatment patient will require port placement.  Patient has a preplanned beach trip in February, therefore he will return to clinic on March 30, 2017 to initiate cycle 1, day 1 of treatment. 2.  Poor appetite: Consider dietary referral in the future. 3.  Hyperglycemia: Monitor closely as patient will be getting dexamethasone as one of his premedications.  Patient expressed understanding and was in agreement with this plan. He also understands that He can call clinic at any time with any questions, concerns, or complaints.   Cancer Staging Adenocarcinoma of gallbladder [facility]) Staging form: Gallbladder, AJCC 8th Edition - Clinical stage from 03/03/2017: Stage IIIA (cT3, cN0, cM0) - Signed by Lloyd Huger, MD on 03/03/2017   Lloyd Huger, MD   03/03/2017 10:00 AM

## 2017-03-01 ENCOUNTER — Ambulatory Visit (INDEPENDENT_AMBULATORY_CARE_PROVIDER_SITE_OTHER): Payer: Medicare Other | Admitting: Internal Medicine

## 2017-03-01 ENCOUNTER — Other Ambulatory Visit: Payer: Self-pay | Admitting: Surgery

## 2017-03-01 DIAGNOSIS — C23 Malignant neoplasm of gallbladder: Secondary | ICD-10-CM

## 2017-03-02 ENCOUNTER — Encounter (INDEPENDENT_AMBULATORY_CARE_PROVIDER_SITE_OTHER): Payer: Self-pay

## 2017-03-02 ENCOUNTER — Encounter: Payer: Self-pay | Admitting: Oncology

## 2017-03-02 ENCOUNTER — Inpatient Hospital Stay: Payer: Medicare Other | Attending: Oncology | Admitting: Oncology

## 2017-03-02 VITALS — BP 96/61 | HR 94 | Temp 96.7°F | Resp 18 | Wt 199.5 lb

## 2017-03-02 DIAGNOSIS — C23 Malignant neoplasm of gallbladder: Secondary | ICD-10-CM | POA: Diagnosis not present

## 2017-03-02 DIAGNOSIS — I10 Essential (primary) hypertension: Secondary | ICD-10-CM | POA: Insufficient documentation

## 2017-03-02 DIAGNOSIS — M129 Arthropathy, unspecified: Secondary | ICD-10-CM | POA: Insufficient documentation

## 2017-03-02 DIAGNOSIS — M109 Gout, unspecified: Secondary | ICD-10-CM | POA: Diagnosis not present

## 2017-03-02 DIAGNOSIS — Z8719 Personal history of other diseases of the digestive system: Secondary | ICD-10-CM | POA: Diagnosis not present

## 2017-03-02 DIAGNOSIS — R630 Anorexia: Secondary | ICD-10-CM

## 2017-03-02 DIAGNOSIS — Z9049 Acquired absence of other specified parts of digestive tract: Secondary | ICD-10-CM

## 2017-03-02 DIAGNOSIS — F329 Major depressive disorder, single episode, unspecified: Secondary | ICD-10-CM

## 2017-03-02 DIAGNOSIS — Z79899 Other long term (current) drug therapy: Secondary | ICD-10-CM | POA: Insufficient documentation

## 2017-03-02 DIAGNOSIS — Z7982 Long term (current) use of aspirin: Secondary | ICD-10-CM | POA: Diagnosis not present

## 2017-03-02 DIAGNOSIS — R739 Hyperglycemia, unspecified: Secondary | ICD-10-CM

## 2017-03-02 DIAGNOSIS — R5383 Other fatigue: Secondary | ICD-10-CM | POA: Insufficient documentation

## 2017-03-02 DIAGNOSIS — Z7189 Other specified counseling: Secondary | ICD-10-CM

## 2017-03-02 DIAGNOSIS — R531 Weakness: Secondary | ICD-10-CM | POA: Diagnosis not present

## 2017-03-02 DIAGNOSIS — K5090 Crohn's disease, unspecified, without complications: Secondary | ICD-10-CM

## 2017-03-02 DIAGNOSIS — Z87442 Personal history of urinary calculi: Secondary | ICD-10-CM | POA: Diagnosis not present

## 2017-03-02 DIAGNOSIS — F419 Anxiety disorder, unspecified: Secondary | ICD-10-CM | POA: Diagnosis not present

## 2017-03-02 DIAGNOSIS — G4730 Sleep apnea, unspecified: Secondary | ICD-10-CM

## 2017-03-02 DIAGNOSIS — K219 Gastro-esophageal reflux disease without esophagitis: Secondary | ICD-10-CM | POA: Insufficient documentation

## 2017-03-02 DIAGNOSIS — E7800 Pure hypercholesterolemia, unspecified: Secondary | ICD-10-CM | POA: Diagnosis not present

## 2017-03-02 DIAGNOSIS — M549 Dorsalgia, unspecified: Secondary | ICD-10-CM | POA: Insufficient documentation

## 2017-03-02 NOTE — Progress Notes (Signed)
Met with Thomas David along with [name] during consult. Introduced Therapist, nutritional and provided contact information for any future needs. All questions answered. Oncology Nurse Navigator Documentation  Navigator Location: [facility] (03/02/17 1400)   )Navigator Encounter Type: Initial MedOnc (03/02/17 1400)                     Patient Visit Type: MedOnc;Initial (03/02/17 1400)   Barriers/Navigation Needs: Coordination of Care;Education (03/02/17 1400)   Interventions: Coordination of Care (03/02/17 1400)            Acuity: Level 2 (03/02/17 1400)         Time Spent with Patient: 30 (03/02/17 1400)

## 2017-03-02 NOTE — Progress Notes (Signed)
START OFF PATHWAY REGIMEN - [Other Dx]   [id number]:Gemcitabine + Cisplatin every 28 days:   A cycle is every 28 days:     Gemcitabine      Cisplatin   **Always confirm dose/schedule in your pharmacy ordering system**    Patient Characteristics: Intent of Therapy: Non-Curative / Palliative Intent, Discussed with Patient

## 2017-03-03 DIAGNOSIS — Z7189 Other specified counseling: Secondary | ICD-10-CM | POA: Insufficient documentation

## 2017-03-03 MED ORDER — PROCHLORPERAZINE MALEATE 10 MG PO TABS: 10.0000 mg | ORAL_TABLET | Freq: Four times a day (QID) | ORAL | 2 refills | Status: DC | PRN

## 2017-03-03 MED ORDER — LIDOCAINE-PRILOCAINE 2.5-2.5 % EX CREA: TOPICAL_CREAM | CUTANEOUS | 3 refills | Status: DC

## 2017-03-03 MED ORDER — ONDANSETRON HCL 8 MG PO TABS: 8.0000 mg | ORAL_TABLET | Freq: Two times a day (BID) | ORAL | 2 refills | Status: DC | PRN

## 2017-03-04 ENCOUNTER — Encounter: Payer: Self-pay | Admitting: Family Medicine

## 2017-03-04 ENCOUNTER — Inpatient Hospital Stay: Payer: Medicare Other

## 2017-03-04 ENCOUNTER — Ambulatory Visit (INDEPENDENT_AMBULATORY_CARE_PROVIDER_SITE_OTHER): Payer: Medicare Other | Admitting: Family Medicine

## 2017-03-04 VITALS — BP 120/66 | HR 81 | Temp 97.6°F | Wt 198.6 lb

## 2017-03-04 DIAGNOSIS — K219 Gastro-esophageal reflux disease without esophagitis: Secondary | ICD-10-CM | POA: Diagnosis not present

## 2017-03-04 DIAGNOSIS — R7309 Other abnormal glucose: Secondary | ICD-10-CM

## 2017-03-04 DIAGNOSIS — I10 Essential (primary) hypertension: Secondary | ICD-10-CM

## 2017-03-04 DIAGNOSIS — K5900 Constipation, unspecified: Secondary | ICD-10-CM

## 2017-03-04 DIAGNOSIS — E119 Type 2 diabetes mellitus without complications: Secondary | ICD-10-CM

## 2017-03-04 DIAGNOSIS — C23 Malignant neoplasm of gallbladder: Secondary | ICD-10-CM

## 2017-03-04 LAB — POCT GLYCOSYLATED HEMOGLOBIN (HGB A1C): Hemoglobin A1C: 8.1

## 2017-03-04 NOTE — Assessment & Plan Note (Signed)
A1c elevated today.  Most recent sugar on fasting testing was in the 200s.  I discussed this diagnosis with the patient.  Patient opted to defer any treatment or management of this currently until he gets through his chemotherapy.  I feel this is reasonable.  If he changes his mind he will let us know.  Reassess after treatment for gallbladder cancer.

## 2017-03-04 NOTE — Patient Instructions (Addendum)
Nice to see you. Please keep follow-up with oncology. Please use the Prilosec 30 minutes before breakfast and 30 minutes before dinner and see if that makes a difference with your reflux.

## 2017-03-04 NOTE — Assessment & Plan Note (Addendum)
Patient recently underwent surgery for this and is going to start chemotherapy soon after port placement.  He will follow with oncology.  I did discuss trying to limit the amount of Tylenol he took when taking the hydrocodone discussed that alternating these doses should be okay.

## 2017-03-04 NOTE — Assessment & Plan Note (Signed)
Well-controlled.  Continue current regimen. 

## 2017-03-04 NOTE — Progress Notes (Signed)
Tommi Rumps, MD Phone: [phone]  Thomas David is a 82 y.o. male who presents today for follow-up.  Patient recently diagnosed with gallbladder cancer.  He underwent drain placement at first given inflammation and then subsequently underwent cholecystectomy which revealed the cancer.  He saw oncology and they are planning to have a port placed and do chemotherapy.  He is meeting with a nutritionist as well through oncology to help with his nutrition. He has Crohn's disease as well.  Intermittent constipation.  Taking Tylenol and hydrocodone alternating for discomfort.  Bowel movements every 2 days.  He notes he has been dealing with all these issues fairly well.  No depression or SI.  Blood pressure is well controlled today.  He is taking amlodipine, losartan, metoprolol.  No chest pain or shortness of breath.  He continues to use his CPAP at least 4 hours nightly.  Wakes up well rested most days.  He has had reflux for some time now.  Mostly burps a lot now.  Omeprazole is not quite as beneficial as it was.  Social History   Tobacco Use  Smoking Status Never Smoker  Smokeless Tobacco Never Used     ROS see history of present illness  Objective  Physical Exam Vitals:   03/04/17 0802  BP: 120/66  Pulse: 81  Temp: 97.6 F (36.4 C)  SpO2: 98%    BP Readings from Last 3 Encounters:  03/04/17 120/66  03/02/17 96/61  02/19/17 (!) 166/78   Wt Readings from Last 3 Encounters:  03/04/17 198 lb 9.6 oz (90.1 kg)  03/02/17 199 lb 8 oz (90.5 kg)  02/15/17 198 lb (89.8 kg)    Physical Exam  Constitutional: No distress.  Cardiovascular: Normal rate, regular rhythm and normal heart sounds.  Pulmonary/Chest: Effort normal and breath sounds normal.  Abdominal: Soft. Bowel sounds are normal. He exhibits no distension. There is no tenderness. There is no rebound and no guarding.  Well-healing scars from his recent surgeries noted  Musculoskeletal: He exhibits no edema.    Neurological: He is alert. Gait normal.  Skin: Skin is warm and dry. He is not diaphoretic.     Assessment/Plan: Please see individual problem list.  Essential hypertension Well-controlled.  Continue current regimen.  Diabetes ([place]) A1c elevated today.  Most recent sugar on fasting testing was in the 200s.  I discussed this diagnosis with the patient.  Patient opted to defer any treatment or management of this currently until he gets through his chemotherapy.  I feel this is reasonable.  If he changes his mind he will let us know.  Reassess after treatment for gallbladder cancer.  Adenocarcinoma of gallbladder [facility]) Patient recently underwent surgery for this and is going to start chemotherapy soon after port placement.  He will follow with oncology.  I did discuss trying to limit the amount of Tylenol he took when taking the hydrocodone discussed that alternating these doses should be okay.  GERD (gastroesophageal reflux disease) GERD symptoms did not improve significantly after his cholecystectomy.  I discussed switching his medications though he is concerned about pricing.  He does have omeprazole at home and he will try 20 mg in the morning before breakfast and 20 mg at night before dinner.  If not improving could try an alternative medication.  Constipation Bowel movements every other day.  Patient will monitor this.  MiraLAX if needed.   Orders Placed This Encounter  Procedures  . POCT HgB A1C    No orders of the  defined types were placed in this encounter.    Tommi Rumps, MD [place]

## 2017-03-04 NOTE — Progress Notes (Signed)
Nutrition Assessment   Reason for Assessment:   Weight loss, no taste  ASSESSMENT:  82 year old male with adenocarcinoma of gallbladder.  Past medical history of cholecystectomy in December 2018 with positive margins, crohns disease, depression, GERD, HTN, prediabetes  Met with patient in clinic today.  Reports that he has a poor appetite and foods don't taste good.  Reports that he lives at [facility] [place].  Reports that for breakfast he usually eats egg with grits or oatmeal, sometimes waffles or cereal.  For lunch has been going to dinning room (1 meal per day is included) and usually eats 1/2 of bowl of soup. For supper may have sandwich and jello.  Reports he has been eating fruit pops or ice pops which helps his dry mouth.  Does report that he has been drinking 2 ensure per day.  Tearful at times during meeting especially when telling me about losing his wife to cancer last year.    Reports has trouble with constipation but usually has BM every 2 days.    Nutrition Focused Physical Exam: deferred  Medications: pentasa, vit B 12  Labs: reviewed  Anthropometrics:   Height: 69 inches Weight: 198 lb 9.6 UBW: 220s (noted in 10/18) BMI: 29  10% weight loss in the last 3 months, significant   Estimated Energy Needs  Kcals: 2250-2700 calories/d Protein: 108-135 g/d Fluid: 2.2 L/d  NUTRITION DIAGNOSIS: Malnutrition related to death of wife, cancer and cancer related treatment (surgery), no taste as evidenced by 10% weight loss in the last 3 months and eating < 75% of energy needs for > 1 month   MALNUTRITION DIAGNOSIS: Patient meets criteria for severe malnutrition in chronic illness as evidenced by 10% weight loss in 3 months and eating < 75% of energy needs for > 1 month    INTERVENTION:   Discussed ways to increase calories and protein, fact sheet given Discussed oral nutrition supplements and samples and coupons given.  Encouraged higher calorie shake.    Recommend liberalizing diet vs restricting carbohydrates due to elevated blood glucose due to weight loss and decreased appetite. Taste changes discussed and fact sheet given on strategies to help with taste change.    MONITORING, EVALUATION, GOAL: weight trends, intake   NEXT VISIT: Feb 28  Nona Gracey B. Zenia Resides, [place], [place] Registered Dietitian [phone] (pager)

## 2017-03-04 NOTE — Assessment & Plan Note (Signed)
GERD symptoms did not improve significantly after his cholecystectomy.  I discussed switching his medications though he is concerned about pricing.  He does have omeprazole at home and he will try 20 mg in the morning before breakfast and 20 mg at night before dinner.  If not improving could try an alternative medication.

## 2017-03-04 NOTE — Assessment & Plan Note (Signed)
Bowel movements every other day.  Patient will monitor this.  MiraLAX if needed.

## 2017-03-05 ENCOUNTER — Ambulatory Visit
Admission: RE | Admit: 2017-03-05 | Discharge: 2017-03-05 | Disposition: A | Discharge status: Home / Self Care | Payer: Medicare Other | Source: Ambulatory Visit | Attending: Surgery | Admitting: Surgery

## 2017-03-05 DIAGNOSIS — C23 Malignant neoplasm of gallbladder: Secondary | ICD-10-CM

## 2017-03-05 MED ORDER — IOPAMIDOL (ISOVUE-300) INJECTION 61%
100.0000 mL | Freq: Once | INTRAVENOUS | Status: AC | PRN
  Administered 2017-03-05: 100 mL via INTRAVENOUS

## 2017-03-08 ENCOUNTER — Other Ambulatory Visit: Payer: Self-pay | Admitting: *Deleted

## 2017-03-08 DIAGNOSIS — C23 Malignant neoplasm of gallbladder: Secondary | ICD-10-CM

## 2017-03-08 MED ORDER — PROCHLORPERAZINE MALEATE 10 MG PO TABS: 10.0000 mg | ORAL_TABLET | Freq: Four times a day (QID) | ORAL | 3 refills | Status: DC | PRN

## 2017-03-08 MED ORDER — LIDOCAINE-PRILOCAINE 2.5-2.5 % EX CREA: TOPICAL_CREAM | CUTANEOUS | 3 refills | Status: DC

## 2017-03-08 MED ORDER — ONDANSETRON HCL 8 MG PO TABS
8.0000 mg | ORAL_TABLET | Freq: Two times a day (BID) | ORAL | 3 refills | Status: DC
Start: 2017-03-08 — End: 2017-06-03

## 2017-03-09 ENCOUNTER — Other Ambulatory Visit: Payer: Self-pay | Admitting: *Deleted

## 2017-03-09 ENCOUNTER — Other Ambulatory Visit (INDEPENDENT_AMBULATORY_CARE_PROVIDER_SITE_OTHER): Payer: Self-pay | Admitting: Vascular Surgery

## 2017-03-09 DIAGNOSIS — C23 Malignant neoplasm of gallbladder: Secondary | ICD-10-CM

## 2017-03-09 MED ORDER — LIDOCAINE-PRILOCAINE 2.5-2.5 % EX CREA: TOPICAL_CREAM | CUTANEOUS | 3 refills | Status: DC

## 2017-03-09 NOTE — Patient Instructions (Signed)
Cisplatin injection What is this medicine? CISPLATIN (SIS pla tin) is a chemotherapy drug. It targets fast dividing cells, like cancer cells, and causes these cells to die. This medicine is used to treat many types of cancer like bladder, ovarian, and testicular cancers. This medicine may be used for other purposes; ask your health care provider or pharmacist if you have questions. COMMON BRAND NAME(S): Platinol, Platinol -AQ What should I tell my health care provider before I take this medicine? They need to know if you have any of these conditions: -blood disorders -hearing problems -kidney disease -recent or ongoing radiation therapy -an unusual or allergic reaction to cisplatin, carboplatin, other chemotherapy, other medicines, foods, dyes, or preservatives -pregnant or trying to get pregnant -breast-feeding How should I use this medicine? This drug is given as an infusion into a vein. It is administered in a hospital or clinic by a specially trained health care professional. Talk to your pediatrician regarding the use of this medicine in children. Special care may be needed. Overdosage: If you think you have taken too much of this medicine contact a poison control center or emergency room at once. NOTE: This medicine is only for you. Do not share this medicine with others. What if I miss a dose? It is important not to miss a dose. Call your doctor or health care professional if you are unable to keep an appointment. What may interact with this medicine? -dofetilide -foscarnet -medicines for seizures -medicines to increase blood counts like filgrastim, pegfilgrastim, sargramostim -probenecid -pyridoxine used with altretamine -rituximab -some antibiotics like amikacin, gentamicin, neomycin, polymyxin B, streptomycin, tobramycin -sulfinpyrazone -vaccines -zalcitabine Talk to your doctor or health care professional before taking any of these  medicines: -acetaminophen -aspirin -ibuprofen -ketoprofen -naproxen This list may not describe all possible interactions. Give your health care provider a list of all the medicines, herbs, non-prescription drugs, or dietary supplements you use. Also tell them if you smoke, drink alcohol, or use illegal drugs. Some items may interact with your medicine. What should I watch for while using this medicine? Your condition will be monitored carefully while you are receiving this medicine. You will need important blood work done while you are taking this medicine. This drug may make you feel generally unwell. This is not uncommon, as chemotherapy can affect healthy cells as well as cancer cells. Report any side effects. Continue your course of treatment even though you feel ill unless your doctor tells you to stop. In some cases, you may be given additional medicines to help with side effects. Follow all directions for their use. Call your doctor or health care professional for advice if you get a fever, chills or sore throat, or other symptoms of a cold or flu. Do not treat yourself. This drug decreases your body's ability to fight infections. Try to avoid being around people who are sick. This medicine may increase your risk to bruise or bleed. Call your doctor or health care professional if you notice any unusual bleeding. Be careful brushing and flossing your teeth or using a toothpick because you may get an infection or bleed more easily. If you have any dental work done, tell your dentist you are receiving this medicine. Avoid taking products that contain aspirin, acetaminophen, ibuprofen, naproxen, or ketoprofen unless instructed by your doctor. These medicines may hide a fever. Do not become pregnant while taking this medicine. Women should inform their doctor if they wish to become pregnant or think they might be pregnant. There is a  potential for serious side effects to an unborn child. Talk to  your health care professional or pharmacist for more information. Do not breast-feed an infant while taking this medicine. Drink fluids as directed while you are taking this medicine. This will help protect your kidneys. Call your doctor or health care professional if you get diarrhea. Do not treat yourself. What side effects may I notice from receiving this medicine? Side effects that you should report to your doctor or health care professional as soon as possible: -allergic reactions like skin rash, itching or hives, swelling of the face, lips, or tongue -signs of infection - fever or chills, cough, sore throat, pain or difficulty passing urine -signs of decreased platelets or bleeding - bruising, pinpoint red spots on the skin, black, tarry stools, nosebleeds -signs of decreased red blood cells - unusually weak or tired, fainting spells, lightheadedness -breathing problems -changes in hearing -gout pain -low blood counts - This drug may decrease the number of white blood cells, red blood cells and platelets. You may be at increased risk for infections and bleeding. -nausea and vomiting -pain, swelling, redness or irritation at the injection site -pain, tingling, numbness in the hands or feet -problems with balance, movement -trouble passing urine or change in the amount of urine Side effects that usually do not require medical attention (report to your doctor or health care professional if they continue or are bothersome): -changes in vision -loss of appetite -metallic taste in the mouth or changes in taste This list may not describe all possible side effects. Call your doctor for medical advice about side effects. You may report side effects to FDA at 1-800-FDA-1088. Where should I keep my medicine? This drug is given in a hospital or clinic and will not be stored at home. NOTE: This sheet is a summary. It may not cover all possible information. If you have questions about this medicine,  talk to your doctor, pharmacist, or health care provider.  2018 Elsevier/Gold Standard (2007-04-26 14:40:54) Gemcitabine injection What is this medicine? GEMCITABINE (jem SIT a been) is a chemotherapy drug. This medicine is used to treat many types of cancer like breast cancer, lung cancer, pancreatic cancer, and ovarian cancer. This medicine may be used for other purposes; ask your health care provider or pharmacist if you have questions. COMMON BRAND NAME(S): Gemzar What should I tell my health care provider before I take this medicine? They need to know if you have any of these conditions: -blood disorders -infection -kidney disease -liver disease -recent or ongoing radiation therapy -an unusual or allergic reaction to gemcitabine, other chemotherapy, other medicines, foods, dyes, or preservatives -pregnant or trying to get pregnant -breast-feeding How should I use this medicine? This drug is given as an infusion into a vein. It is administered in a hospital or clinic by a specially trained health care professional. Talk to your pediatrician regarding the use of this medicine in children. Special care may be needed. Overdosage: If you think you have taken too much of this medicine contact a poison control center or emergency room at once. NOTE: This medicine is only for you. Do not share this medicine with others. What if I miss a dose? It is important not to miss your dose. Call your doctor or health care professional if you are unable to keep an appointment. What may interact with this medicine? -medicines to increase blood counts like filgrastim, pegfilgrastim, sargramostim -some other chemotherapy drugs like cisplatin -vaccines Talk to your doctor or health  care professional before taking any of these medicines: -acetaminophen -aspirin -ibuprofen -ketoprofen -naproxen This list may not describe all possible interactions. Give your health care provider a list of all the  medicines, herbs, non-prescription drugs, or dietary supplements you use. Also tell them if you smoke, drink alcohol, or use illegal drugs. Some items may interact with your medicine. What should I watch for while using this medicine? Visit your doctor for checks on your progress. This drug may make you feel generally unwell. This is not uncommon, as chemotherapy can affect healthy cells as well as cancer cells. Report any side effects. Continue your course of treatment even though you feel ill unless your doctor tells you to stop. In some cases, you may be given additional medicines to help with side effects. Follow all directions for their use. Call your doctor or health care professional for advice if you get a fever, chills or sore throat, or other symptoms of a cold or flu. Do not treat yourself. This drug decreases your body's ability to fight infections. Try to avoid being around people who are sick. This medicine may increase your risk to bruise or bleed. Call your doctor or health care professional if you notice any unusual bleeding. Be careful brushing and flossing your teeth or using a toothpick because you may get an infection or bleed more easily. If you have any dental work done, tell your dentist you are receiving this medicine. Avoid taking products that contain aspirin, acetaminophen, ibuprofen, naproxen, or ketoprofen unless instructed by your doctor. These medicines may hide a fever. Women should inform their doctor if they wish to become pregnant or think they might be pregnant. There is a potential for serious side effects to an unborn child. Talk to your health care professional or pharmacist for more information. Do not breast-feed an infant while taking this medicine. What side effects may I notice from receiving this medicine? Side effects that you should report to your doctor or health care professional as soon as possible: -allergic reactions like skin rash, itching or hives,  swelling of the face, lips, or tongue -low blood counts - this medicine may decrease the number of white blood cells, red blood cells and platelets. You may be at increased risk for infections and bleeding. -signs of infection - fever or chills, cough, sore throat, pain or difficulty passing urine -signs of decreased platelets or bleeding - bruising, pinpoint red spots on the skin, black, tarry stools, blood in the urine -signs of decreased red blood cells - unusually weak or tired, fainting spells, lightheadedness -breathing problems -chest pain -mouth sores -nausea and vomiting -pain, swelling, redness at site where injected -pain, tingling, numbness in the hands or feet -stomach pain -swelling of ankles, feet, hands -unusual bleeding Side effects that usually do not require medical attention (report to your doctor or health care professional if they continue or are bothersome): -constipation -diarrhea -hair loss -loss of appetite -stomach upset This list may not describe all possible side effects. Call your doctor for medical advice about side effects. You may report side effects to FDA at 1-800-FDA-1088. Where should I keep my medicine? This drug is given in a hospital or clinic and will not be stored at home. NOTE: This sheet is a summary. It may not cover all possible information. If you have questions about this medicine, talk to your doctor, pharmacist, or health care provider.  2018 Elsevier/Gold Standard (2007-05-31 18:45:54)

## 2017-03-10 ENCOUNTER — Inpatient Hospital Stay: Payer: Medicare Other | Attending: Oncology

## 2017-03-10 DIAGNOSIS — R630 Anorexia: Secondary | ICD-10-CM | POA: Insufficient documentation

## 2017-03-10 DIAGNOSIS — E279 Disorder of adrenal gland, unspecified: Secondary | ICD-10-CM | POA: Insufficient documentation

## 2017-03-10 DIAGNOSIS — K219 Gastro-esophageal reflux disease without esophagitis: Secondary | ICD-10-CM | POA: Insufficient documentation

## 2017-03-10 DIAGNOSIS — Z5111 Encounter for antineoplastic chemotherapy: Secondary | ICD-10-CM | POA: Insufficient documentation

## 2017-03-10 DIAGNOSIS — Z9049 Acquired absence of other specified parts of digestive tract: Secondary | ICD-10-CM | POA: Insufficient documentation

## 2017-03-10 DIAGNOSIS — M129 Arthropathy, unspecified: Secondary | ICD-10-CM | POA: Insufficient documentation

## 2017-03-10 DIAGNOSIS — M549 Dorsalgia, unspecified: Secondary | ICD-10-CM | POA: Insufficient documentation

## 2017-03-10 DIAGNOSIS — Z79899 Other long term (current) drug therapy: Secondary | ICD-10-CM | POA: Insufficient documentation

## 2017-03-10 DIAGNOSIS — N433 Hydrocele, unspecified: Secondary | ICD-10-CM | POA: Insufficient documentation

## 2017-03-10 DIAGNOSIS — G4730 Sleep apnea, unspecified: Secondary | ICD-10-CM | POA: Insufficient documentation

## 2017-03-10 DIAGNOSIS — I10 Essential (primary) hypertension: Secondary | ICD-10-CM | POA: Insufficient documentation

## 2017-03-10 DIAGNOSIS — I2510 Atherosclerotic heart disease of native coronary artery without angina pectoris: Secondary | ICD-10-CM | POA: Insufficient documentation

## 2017-03-10 DIAGNOSIS — C786 Secondary malignant neoplasm of retroperitoneum and peritoneum: Secondary | ICD-10-CM | POA: Insufficient documentation

## 2017-03-10 DIAGNOSIS — Z87442 Personal history of urinary calculi: Secondary | ICD-10-CM | POA: Insufficient documentation

## 2017-03-10 DIAGNOSIS — J90 Pleural effusion, not elsewhere classified: Secondary | ICD-10-CM | POA: Insufficient documentation

## 2017-03-10 DIAGNOSIS — I700 Atherosclerosis of aorta: Secondary | ICD-10-CM | POA: Insufficient documentation

## 2017-03-10 DIAGNOSIS — N400 Enlarged prostate without lower urinary tract symptoms: Secondary | ICD-10-CM | POA: Insufficient documentation

## 2017-03-10 DIAGNOSIS — F329 Major depressive disorder, single episode, unspecified: Secondary | ICD-10-CM | POA: Insufficient documentation

## 2017-03-10 DIAGNOSIS — C787 Secondary malignant neoplasm of liver and intrahepatic bile duct: Secondary | ICD-10-CM | POA: Insufficient documentation

## 2017-03-10 DIAGNOSIS — R739 Hyperglycemia, unspecified: Secondary | ICD-10-CM | POA: Insufficient documentation

## 2017-03-10 DIAGNOSIS — N281 Cyst of kidney, acquired: Secondary | ICD-10-CM | POA: Insufficient documentation

## 2017-03-10 DIAGNOSIS — G8929 Other chronic pain: Secondary | ICD-10-CM | POA: Insufficient documentation

## 2017-03-10 DIAGNOSIS — R188 Other ascites: Secondary | ICD-10-CM | POA: Insufficient documentation

## 2017-03-10 DIAGNOSIS — K4020 Bilateral inguinal hernia, without obstruction or gangrene, not specified as recurrent: Secondary | ICD-10-CM | POA: Insufficient documentation

## 2017-03-10 DIAGNOSIS — F419 Anxiety disorder, unspecified: Secondary | ICD-10-CM | POA: Insufficient documentation

## 2017-03-10 DIAGNOSIS — C23 Malignant neoplasm of gallbladder: Secondary | ICD-10-CM | POA: Insufficient documentation

## 2017-03-10 DIAGNOSIS — E7800 Pure hypercholesterolemia, unspecified: Secondary | ICD-10-CM | POA: Insufficient documentation

## 2017-03-15 ENCOUNTER — Telehealth: Payer: Self-pay

## 2017-03-15 NOTE — Telephone Encounter (Signed)
Received call from son, Thomas David. Thomas David has given permission for his care to be discussed with his son. We spoke at length regarding his treatment plan, side effects, nutrition, symptom management. Oncology Nurse Navigator Documentation  Navigator Location: [facility] (03/15/17 1100)   )Navigator Encounter Type: Telephone (03/15/17 1100) Telephone: Incoming Call;Symptom Mgt;Diagnostic Results;Education;Patient Update (03/15/17 1100)                                                  Time Spent with Patient: 60 (03/15/17 1100)

## 2017-03-16 MED ORDER — CEFAZOLIN SODIUM-DEXTROSE 2-4 GM/100ML-% IV SOLN
2.0000 g | Freq: Once | INTRAVENOUS | Status: AC
  Administered 2017-03-17: 2 g via INTRAVENOUS

## 2017-03-17 ENCOUNTER — Ambulatory Visit: Payer: Medicare Other | Admitting: Family Medicine

## 2017-03-17 ENCOUNTER — Encounter: Payer: Self-pay | Admitting: Registered Nurse

## 2017-03-17 ENCOUNTER — Encounter: Payer: Self-pay | Admitting: Vascular Surgery

## 2017-03-17 ENCOUNTER — Encounter
Admission: RE | Disposition: A | Discharge status: Home / Self Care | Payer: Self-pay | Source: Ambulatory Visit | Attending: Vascular Surgery

## 2017-03-17 ENCOUNTER — Ambulatory Visit
Admission: RE | Admit: 2017-03-17 | Discharge: 2017-03-17 | Disposition: A | Discharge status: Home / Self Care | Payer: Medicare Other | Source: Ambulatory Visit | Attending: Vascular Surgery | Admitting: Vascular Surgery

## 2017-03-17 DIAGNOSIS — Z833 Family history of diabetes mellitus: Secondary | ICD-10-CM | POA: Insufficient documentation

## 2017-03-17 DIAGNOSIS — R7303 Prediabetes: Secondary | ICD-10-CM | POA: Insufficient documentation

## 2017-03-17 DIAGNOSIS — Z9049 Acquired absence of other specified parts of digestive tract: Secondary | ICD-10-CM | POA: Insufficient documentation

## 2017-03-17 DIAGNOSIS — C23 Malignant neoplasm of gallbladder: Secondary | ICD-10-CM | POA: Insufficient documentation

## 2017-03-17 DIAGNOSIS — E7800 Pure hypercholesterolemia, unspecified: Secondary | ICD-10-CM | POA: Insufficient documentation

## 2017-03-17 DIAGNOSIS — Z79899 Other long term (current) drug therapy: Secondary | ICD-10-CM | POA: Insufficient documentation

## 2017-03-17 DIAGNOSIS — Z9889 Other specified postprocedural states: Secondary | ICD-10-CM | POA: Insufficient documentation

## 2017-03-17 DIAGNOSIS — Z823 Family history of stroke: Secondary | ICD-10-CM | POA: Diagnosis not present

## 2017-03-17 DIAGNOSIS — M109 Gout, unspecified: Secondary | ICD-10-CM | POA: Insufficient documentation

## 2017-03-17 DIAGNOSIS — M1990 Unspecified osteoarthritis, unspecified site: Secondary | ICD-10-CM | POA: Diagnosis not present

## 2017-03-17 DIAGNOSIS — I10 Essential (primary) hypertension: Secondary | ICD-10-CM | POA: Diagnosis not present

## 2017-03-17 DIAGNOSIS — G4730 Sleep apnea, unspecified: Secondary | ICD-10-CM | POA: Diagnosis not present

## 2017-03-17 DIAGNOSIS — Z87442 Personal history of urinary calculi: Secondary | ICD-10-CM | POA: Insufficient documentation

## 2017-03-17 DIAGNOSIS — Z8249 Family history of ischemic heart disease and other diseases of the circulatory system: Secondary | ICD-10-CM | POA: Diagnosis not present

## 2017-03-17 DIAGNOSIS — K5090 Crohn's disease, unspecified, without complications: Secondary | ICD-10-CM | POA: Insufficient documentation

## 2017-03-17 DIAGNOSIS — K219 Gastro-esophageal reflux disease without esophagitis: Secondary | ICD-10-CM | POA: Diagnosis not present

## 2017-03-17 DIAGNOSIS — Z7982 Long term (current) use of aspirin: Secondary | ICD-10-CM | POA: Insufficient documentation

## 2017-03-17 HISTORY — PX: PORTA CATH INSERTION: CATH118285

## 2017-03-17 SURGERY — PORTA CATH INSERTION
Anesthesia: Moderate Sedation

## 2017-03-17 MED ORDER — HYDROMORPHONE HCL 1 MG/ML IJ SOLN: 1.0000 mg | Freq: Once | INTRAMUSCULAR | Status: DC | PRN

## 2017-03-17 MED ORDER — MIDAZOLAM HCL 2 MG/2ML IJ SOLN
INTRAMUSCULAR | Status: AC
  Filled 2017-03-17: qty 2

## 2017-03-17 MED ORDER — FENTANYL CITRATE (PF) 100 MCG/2ML IJ SOLN
INTRAMUSCULAR | Status: AC
  Filled 2017-03-17: qty 2

## 2017-03-17 MED ORDER — HEPARIN (PORCINE) IN NACL 2-0.9 UNIT/ML-% IJ SOLN
INTRAMUSCULAR | Status: AC
  Filled 2017-03-17: qty 500

## 2017-03-17 MED ORDER — CEFAZOLIN SODIUM-DEXTROSE 2-4 GM/100ML-% IV SOLN
INTRAVENOUS | Status: AC
  Filled 2017-03-17: qty 100

## 2017-03-17 MED ORDER — GENTAMICIN SULFATE 40 MG/ML IJ SOLN
Freq: Once | INTRAMUSCULAR | Status: DC
  Filled 2017-03-17: qty 2

## 2017-03-17 MED ORDER — MIDAZOLAM HCL 2 MG/2ML IJ SOLN
INTRAMUSCULAR | Status: DC | PRN
  Administered 2017-03-17: 2 mg via INTRAVENOUS
  Administered 2017-03-17: 0.5 mg via INTRAVENOUS
  Administered 2017-03-17: 1 mg via INTRAVENOUS

## 2017-03-17 MED ORDER — LIDOCAINE-EPINEPHRINE (PF) 1 %-1:200000 IJ SOLN
INTRAMUSCULAR | Status: AC
  Filled 2017-03-17: qty 30

## 2017-03-17 MED ORDER — FENTANYL CITRATE (PF) 100 MCG/2ML IJ SOLN
INTRAMUSCULAR | Status: DC | PRN
  Administered 2017-03-17: 25 ug via INTRAVENOUS
  Administered 2017-03-17: 50 ug via INTRAVENOUS
  Administered 2017-03-17: 25 ug via INTRAVENOUS

## 2017-03-17 MED ORDER — SODIUM CHLORIDE 0.9 % IV SOLN
INTRAVENOUS | Status: DC
  Administered 2017-03-17: 10:00:00 via INTRAVENOUS

## 2017-03-17 MED ORDER — ONDANSETRON HCL 4 MG/2ML IJ SOLN: 4.0000 mg | Freq: Four times a day (QID) | INTRAMUSCULAR | Status: DC | PRN

## 2017-03-17 SURGICAL SUPPLY — 11 items
ADH SKN CLS APL DERMABOND .7 (GAUZE/BANDAGES/DRESSINGS) ×1
DERMABOND ADVANCED (GAUZE/BANDAGES/DRESSINGS) ×2
DERMABOND ADVANCED .7 DNX12 (GAUZE/BANDAGES/DRESSINGS) IMPLANT
DRAPE INCISE IOBAN 66X45 STRL (DRAPES) ×3 IMPLANT
KIT PORT POWER 8FR ISP CVUE (Miscellaneous) ×2 IMPLANT
PACK ANGIOGRAPHY (CUSTOM PROCEDURE TRAY) ×3 IMPLANT
SET INTRO CAPELLA COAXIAL (SET/KITS/TRAYS/PACK) ×2 IMPLANT
SUT MNCRL AB 4-0 PS2 18 (SUTURE) ×3 IMPLANT
SUT PROLENE 0 CT 1 30 (SUTURE) ×3 IMPLANT
SUTURE VIC 3-0 (SUTURE) ×3 IMPLANT
TOWEL OR 17X26 4PK STRL BLUE (TOWEL DISPOSABLE) ×2 IMPLANT

## 2017-03-17 NOTE — Progress Notes (Signed)
Pt resting at this time post procedure, vitals stable. Denies complaints. Discharge instructions given with questions answered.friend at bedside.

## 2017-03-17 NOTE — Discharge Instructions (Signed)
Tunneled Catheter Insertion, Care After Refer to this sheet in the next few weeks. These instructions provide you with information about caring for yourself after your procedure. Your health care provider may also give you more specific instructions. Your treatment has been planned according to current medical practices, but problems sometimes occur. Call your health care provider if you have any problems or questions after your procedure. What can I expect after the procedure? After the procedure, it is common to have:  Some mild redness, swelling, and pain around your catheter site.  A small amount of blood or clear fluid coming from your incisions.  Follow these instructions at home: Incision care  Check your incision areas every day for signs of infection. Check for: ? More redness, swelling, or pain. ? More fluid or blood. ? Warmth. ? Pus or a bad smell.  Follow instructions from your health care provider about how to take care of your incisions. Make sure you: ? Wash your hands with soap and water before you change your bandages (dressings). If soap and water are not available, use hand sanitizer. ? Change your dressings as told by your health care provider. Wash the area around your incisions with a germ-killing (antiseptic) solution when you change your dressing, as told by your health care provider. ? Leave stitches (sutures), skin glue, or adhesive strips in place. These skin closures may need to stay in place for 2 weeks or longer. If adhesive strip edges start to loosen and curl up, you may trim the loose edges. Do not remove adhesive strips completely unless your health care provider tells you to do that. Catheter Care   Wash your hands with soap and water before and after caring for your catheter. If soap and water are not available, use hand sanitizer.  Keep your catheter site and your dressings clean and dry.  Apply an antibiotic ointment to your catheter site as told by  your health care provider.  Flush your catheter as told by your health care provider. This helps prevent it from becoming clogged.  Do not open the caps on the ends of the catheter.  Do not pull on your catheter.  If your catheter is in your arm: ? Avoid wearing tight clothes or tight jewelry on your arm that has the catheter. ? Do not sleep with your head on the arm that has the catheter. ? Do not allow your blood pressure to be taken on the arm that has the catheter. ? Do not allow your blood to be drawn from the arm that has the catheter, except through the catheter itself. Medicines  Take over-the-counter and prescription medicines only as told by your health care provider.  If you were prescribed an antibiotic medicine, take it as told by your health care provider. Do not stop taking the antibiotic even if you start to feel better. Activity  Return to your normal activities as told by your health care provider. Ask your health care provider what activities are safe for you.  Do not lift anything that is heavier than 10 lb (4.5 kg) for 3 weeks or as long as told by your health care provider. Driving  Do not drive until your health care provider approves.  Do not drive or operate heavy machinery while taking prescription pain medicine. General instructions  Follow your health care provider's specific instructions for the type of catheter that you have.  Do not take baths, swim, or use a hot tub until your health  care provider approves.  Follow instructions from your health care provider about eating or drinking restrictions.  Wear compression stockings as told by your health care provider. These stockings help to prevent blood clots and reduce swelling in your legs.  Keep all follow-up visits as told by your health care provider. This is important. Contact a health care provider if:  You have more fluid or blood coming from your incisions.  You have more redness,  swelling, or pain at your incisions or around the area where your catheter is inserted.  Your incisions feel warm to the touch.  You feel unusually weak.  You feel nauseous.  Your catheter is not working properly.  You have blood or fluid draining from your catheter.  You are unable to flush your catheter. Get help right away if:  Your catheter breaks.  A hole develops in your catheter.  Your catheter comes loose or gets pulled completely out. If this happens, press on your catheter site firmly with your hand or a clean cloth until you get medical help.  Your catheter becomes blocked.  You have swelling in your arm, shoulder, neck, or face.  You develop chest pain.  You have difficulty breathing.  You feel dizzy or light-headed.  You have pus or a bad smell coming from your incisions.  You have a fever.  You develop bleeding from your catheter or your insertion site, and your bleeding does not stop. This information is not intended to replace advice given to you by your health care provider. Make sure you discuss any questions you have with your health care provider. Document Released: 01/06/2012 Document Revised: 09/22/2015 Document Reviewed: 10/15/2014 Elsevier Interactive Patient Education  [employer].

## 2017-03-17 NOTE — H&P (Signed)
[facility] History & Physical Update  The patient was interviewed and re-examined.  The patient's previous History and Physical has been reviewed and is unchanged.  There is no change in the plan of care. We plan to proceed with the scheduled procedure.  Hortencia Pilar, MD  03/17/2017, 10:29 AM

## 2017-03-17 NOTE — Op Note (Signed)
OPERATIVE NOTE   PROCEDURE: 1. Placement of a right IJ Infuse-a-Port  PRE-OPERATIVE DIAGNOSIS: Adenocarcinoma of the gallbladder  POST-OPERATIVE DIAGNOSIS: Same  SURGEON: Katha Cabal M.D.  ANESTHESIA: Conscious sedation was administered under my direct supervision by the interventional radiology RN. IV Versed plus fentanyl were utilized. Continuous ECG, pulse oximetry and blood pressure was monitored throughout the entire procedure. Conscious sedation was for a total of 21 minutes.  ESTIMATED BLOOD LOSS: Minimal   FINDING(S): 1.  Patent vein  SPECIMEN(S): None  INDICATIONS:   Thomas David is a 82 y.o. male who presents with adenocarcinoma of the gallbladder.  He will require chemotherapy and therefore appropriate intravenous access.  Risks and benefits of Infuse-a-Port of been explained to the patient all questions answered patient is agreed to proceed.  DESCRIPTION: After obtaining full informed written consent, the patient was brought back to the special procedure suite and placed in the supine position. The patient's right neck and chest wall are prepped and draped in sterile fashion. Appropriate timeout was called.  Ultrasound is placed in a sterile sleeve, ultrasound is utilized to avoid vascular injury as well as secondary to lack of appropriate landmarks. The right internal jugular vein is identified. It is echolucent and homogeneous as well as easily compressible indicating patency. An image is recorded for the permanent record.  Access to the vein with a micropuncture needle is done under direct ultrasound visualization.  1% lidocaine is infiltrated into the soft tissue at the base of the neck as well as on the chest wall.  Under direct ultrasound visualization a micro-needle is inserted into the vein followed by the micro-wire. Micro-sheath was then advanced and a J wire is inserted without difficulty under fluoroscopic guidance. A small counterincision was created at  the wire insertion site. A transverse incision is created 2 fingerbreadths below the scapula and a pocket is fashioned using both blunt and sharp dissection. The pocket is tested for appropriate size with the hub of the Infuse-a-Port. The tunneling device is then used to pull the intravascular portion of the catheter from the pocket to the neck counterincision.  Dilator and peel-away sheath were then inserted over the wire and the wire is removed. Catheter is then advanced into the venous system without difficulty. Peel-away sheath was then removed.  Catheter is then positioned under fluoroscopic guidance at the atrial caval junction. It is then transected connected to the hub and the hope is slipped into the subcutaneous pocket on the chest wall. The hub was then accessed percutaneously and aspirates easily and flushes well and is flushed with 30 cc of heparinized saline. The pocket incision is then closed in layers using interrupted 3-0 Vicryl for the subcutaneous tissues and 4-0 Monocryl subcuticular for skin closure. Dermabond is applied. The neck counterincision was closed with 4-0 Monocryl subcuticular and Dermabond as well.  The patient tolerated the procedure well and there were no immediate complications.  COMPLICATIONS: None  CONDITION: Unchanged  Katha Cabal M.D.  [facility]in and vascular Office: [phone]   03/17/2017, 11:11 AM

## 2017-03-19 ENCOUNTER — Telehealth: Payer: Self-pay

## 2017-03-19 ENCOUNTER — Ambulatory Visit
Admission: RE | Admit: 2017-03-19 | Discharge: 2017-03-19 | Disposition: A | Discharge status: Home / Self Care | Payer: Medicare Other | Source: Ambulatory Visit | Attending: Oncology | Admitting: Oncology

## 2017-03-19 DIAGNOSIS — J90 Pleural effusion, not elsewhere classified: Secondary | ICD-10-CM | POA: Insufficient documentation

## 2017-03-19 DIAGNOSIS — I2510 Atherosclerotic heart disease of native coronary artery without angina pectoris: Secondary | ICD-10-CM | POA: Insufficient documentation

## 2017-03-19 DIAGNOSIS — R188 Other ascites: Secondary | ICD-10-CM | POA: Insufficient documentation

## 2017-03-19 DIAGNOSIS — C23 Malignant neoplasm of gallbladder: Secondary | ICD-10-CM | POA: Insufficient documentation

## 2017-03-19 DIAGNOSIS — C787 Secondary malignant neoplasm of liver and intrahepatic bile duct: Secondary | ICD-10-CM | POA: Diagnosis not present

## 2017-03-19 DIAGNOSIS — N400 Enlarged prostate without lower urinary tract symptoms: Secondary | ICD-10-CM | POA: Diagnosis not present

## 2017-03-19 DIAGNOSIS — C7971 Secondary malignant neoplasm of right adrenal gland: Secondary | ICD-10-CM | POA: Diagnosis not present

## 2017-03-19 DIAGNOSIS — R918 Other nonspecific abnormal finding of lung field: Secondary | ICD-10-CM | POA: Diagnosis not present

## 2017-03-19 DIAGNOSIS — N281 Cyst of kidney, acquired: Secondary | ICD-10-CM | POA: Diagnosis not present

## 2017-03-19 DIAGNOSIS — C7972 Secondary malignant neoplasm of left adrenal gland: Secondary | ICD-10-CM | POA: Insufficient documentation

## 2017-03-19 DIAGNOSIS — K4020 Bilateral inguinal hernia, without obstruction or gangrene, not specified as recurrent: Secondary | ICD-10-CM | POA: Diagnosis not present

## 2017-03-19 DIAGNOSIS — N433 Hydrocele, unspecified: Secondary | ICD-10-CM | POA: Diagnosis not present

## 2017-03-19 DIAGNOSIS — J9811 Atelectasis: Secondary | ICD-10-CM | POA: Insufficient documentation

## 2017-03-19 DIAGNOSIS — C786 Secondary malignant neoplasm of retroperitoneum and peritoneum: Secondary | ICD-10-CM | POA: Insufficient documentation

## 2017-03-19 LAB — GLUCOSE, CAPILLARY: Glucose-Capillary: 142 mg/dL — ABNORMAL HIGH (ref 65–99)

## 2017-03-19 MED ORDER — FLUDEOXYGLUCOSE F - 18 (FDG) INJECTION
12.6200 | Freq: Once | INTRAVENOUS | Status: AC | PRN
  Administered 2017-03-19: 12.62 via INTRAVENOUS

## 2017-03-19 NOTE — Telephone Encounter (Signed)
Voicemail left with Mr. Endicott to return call. We would like to arrange for a pre treatment appointment with [name]. Mr. Bordas has reported he continues with a poor appetite and is not eating very well. He is being seen by dietician currently.  Oncology Nurse Navigator Documentation  Navigator Location: [facility] (03/19/17 1000)   )Navigator Encounter Type: Telephone (03/19/17 1000) Telephone: Outgoing Call;Patient Update (03/19/17 1000)                                                  Time Spent with Patient: 15 (03/19/17 1000)

## 2017-03-19 NOTE — Telephone Encounter (Signed)
Notified Thomas David of pre treatment appointment 2/19 at 1015 with read back performed. Oncology Nurse Navigator Documentation  Navigator Location: [facility] (03/19/17 1100)   )Navigator Encounter Type: Telephone (03/19/17 1100) Telephone: Outgoing Call (03/19/17 1100)                                                  Time Spent with Patient: 15 (03/19/17 1100)

## 2017-03-21 NOTE — Progress Notes (Signed)
[facility]  [phone] Fax:([phone]  ID: Thomas David OB: Feb 27, 1935  MR#: [id number]  XNT#:[id number]  Patient Care Team: Leone Haven, MD as PCP - General (Family Medicine) Kathyrn Drown, MD (Family Medicine) Clent Jacks, RN as Registered Nurse  CHIEF COMPLAINT: Adenocarcinoma of gallbladder.  INTERVAL HISTORY: Patient returns to clinic today for further evaluation.  He continues to have a decreased performance status, but this has mildly improved.  He has a poor appetite, but states he "forces himself to eat".  He continues to live alone and complete all of his ADLs.  He continues to have intermittent abdominal pain.  He has no neurologic complaints.  He denies any recent fevers or illnesses. He has intermittent nausea, but denies any vomiting, constipation, or diarrhea.  He has no urinary complaints.  Patient otherwise feels well and offers no further specific complaints.  REVIEW OF SYSTEMS:   Review of Systems  Constitutional: Positive for malaise/fatigue. Negative for fever and weight loss.  Respiratory: Negative.  Negative for cough and shortness of breath.   Cardiovascular: Negative for chest pain and leg swelling.  Gastrointestinal: Positive for abdominal pain and nausea.  Genitourinary: Negative for dysuria.  Musculoskeletal: Negative.   Skin: Negative.  Negative for rash.  Neurological: Positive for weakness.  Psychiatric/Behavioral: The patient is nervous/anxious.     As per HPI. Otherwise, a complete review of systems is negative.  PAST MEDICAL HISTORY: Past Medical History:  Diagnosis Date  . Anxiety   . Arthritis   . Chronic lower back pain   . Crohn's disease ([place])   . Depression   . Difficult intubation    patient denies on 02/12/17  . GERD (gastroesophageal reflux disease)   . Gout   . History of kidney stones 40 years ago  . Hypercholesteremia   . Hypertension   . Prediabetes   . Sleep apnea    cpap      PAST SURGICAL HISTORY: Past Surgical History:  Procedure Laterality Date  . CHOLECYSTECTOMY N/A 11/03/2016   Procedure: LAPAROSCOPIC CHOLECYSTostomy with tube placement ;  Surgeon: Johnathan Hausen, MD;  Location: [facility];  Service: General;  Laterality: N/A;  . CHOLECYSTECTOMY N/A 02/15/2017   Procedure: LAPAROSCOPIC SUBTOTAL CHOLECYSTECTOMY, BIOPSY OF GALLBLADDER;  Surgeon: Johnathan Hausen, MD;  Location: [facility];  Service: General;  Laterality: N/A;  . COLECTOMY  1973   "part of large intestines" (05/04/2012)  . COLON RESECTION    . COLONOSCOPY    . COLONOSCOPY N/A 05/23/2014   Procedure: COLONOSCOPY;  Surgeon: Rogene Houston, MD;  [facility];  Service: Endoscopy;  Laterality: N/A;  1200  . ESOPHAGEAL DILATION N/A 05/23/2014   Procedure: ESOPHAGEAL DILATION;  Surgeon: Rogene Houston, MD;  [facility];  Service: Endoscopy;  Laterality: N/A;  . ESOPHAGOGASTRODUODENOSCOPY N/A 03/16/2014   Procedure: esophageal dilation ;  Surgeon: Rogene Houston, MD;  [facility];  Service: Endoscopy;  Laterality: N/A;  . ESOPHAGOGASTRODUODENOSCOPY N/A 05/23/2014   Procedure: ESOPHAGOGASTRODUODENOSCOPY (EGD);  Surgeon: Rogene Houston, MD;  [facility];  Service: Endoscopy;  Laterality: N/A;  . ESOPHAGOGASTRODUODENOSCOPY (EGD) WITH PROPOFOL N/A 02/18/2017   Procedure: ESOPHAGOGASTRODUODENOSCOPY (EGD) WITH PROPOFOL;  Surgeon: Wonda Horner, MD;  Location: [facility];  Service: Endoscopy;  Laterality: N/A;  . [place]  . IR CHOLANGIOGRAM EXISTING TUBE  11/26/2016  . KIDNEY STONE SURGERY  1970's  . PARTIAL COLECTOMY  ~ 40 years ago  some of large and small  . PORTA CATH INSERTION N/A 03/17/2017   Procedure: PORTA CATH INSERTION;  Surgeon: Katha Cabal, MD;  Location: [facility];  Service: Cardiovascular;  Laterality: N/A;  . PROSTATE SURGERY    . THYROIDECTOMY Right 05/04/2012   Procedure: RIGHT HEMI THYROIDECTOMY;  Surgeon:  Ascencion Dike, MD;  Location: [facility];  Service: ENT;  Laterality: Right;  . THYROIDECTOMY    . TONSILLECTOMY AND ADENOIDECTOMY     as a child  . TRANSURETHRAL PROSTATECTOMY WITH GYRUS INSTRUMENTS N/A 07/11/2012   Procedure: TRANSURETHRAL PROSTATECTOMY WITH GYRUS INSTRUMENTS;  Surgeon: Claybon Jabs, MD;  Location: [facility];  Service: Urology;  Laterality: N/A;  . UPPER GASTROINTESTINAL ENDOSCOPY      FAMILY HISTORY: Family History  Problem Relation Age of Onset  . Diabetes Mother   . Stroke Father   . Healthy Daughter   . Healthy Son   . Heart disease Sister     ADVANCED DIRECTIVES (Y/N):  N  HEALTH MAINTENANCE: Social History   Tobacco Use  . Smoking status: Never Smoker  . Smokeless tobacco: Never Used  Substance Use Topics  . Alcohol use: No    Alcohol/week: 0.0 oz  . Drug use: No     Colonoscopy:  PAP:  Bone density:  Lipid panel:  No Known Allergies  Current Outpatient Medications  Medication Sig Dispense Refill  . acetaminophen (TYLENOL) 500 MG tablet Take 1,000 mg by mouth every 4 (four) hours as needed for moderate pain or headache.     . allopurinol (ZYLOPRIM) 300 MG tablet Take 1 tablet (300 mg total) by mouth every morning. 90 tablet 3  . amLODipine (NORVASC) 5 MG tablet Take 1 tablet (5 mg total) by mouth daily. (Patient taking differently: Take 5 mg by mouth every evening. ) 90 tablet 3  . aspirin EC 81 MG tablet Take 81 mg by mouth at bedtime.    . fluticasone (FLONASE) 50 MCG/ACT nasal spray USE 1 SPRAY INTO BOTH  NOSTRILS DAILY (Patient taking differently: USE 1 SPRAY INTO BOTH  NOSTRILS AT NIGHT) 32 g 0  . HYDROcodone-acetaminophen (NORCO/VICODIN) 5-325 MG tablet Take 1-2 tablets by mouth every 4 (four) hours as needed for moderate pain. 30 tablet 0  . losartan (COZAAR) 100 MG tablet Take 1 tablet (100 mg total) by mouth daily. 90 tablet 3  . mesalamine (PENTASA) 500 MG CR capsule Take 1 capsule (500 mg total) by mouth 4 (four) times daily. (Patient taking  differently: Take 1,000 mg by mouth 2 (two) times daily. ) 360 capsule 3  . metoprolol (LOPRESSOR) 50 MG tablet Take 1 tablet (50 mg total) by mouth 2 (two) times daily. (Patient taking differently: Take 50 mg by mouth 2 (two) times daily. ) 180 tablet 3  . Multiple Vitamin (MULTIVITAMIN WITH MINERALS) TABS tablet Take 2 tablets by mouth daily.    Marland Kitchen omeprazole (PRILOSEC) 20 MG capsule TAKE 1 CAPSULE BY MOUTH  DAILY (Patient taking differently: TAKE 1 CAPSULE BY MOUTH  TWICE DAILY) 90 capsule 3  . Probiotic Product (PHILLIPS COLON HEALTH PO) Take 1 tablet by mouth daily with supper.     . simvastatin (ZOCOR) 20 MG tablet Take 1 tablet (20 mg total) by mouth daily. (Patient taking differently: Take 20 mg by mouth at bedtime. ) 90 tablet 3  . vitamin B-12 (CYANOCOBALAMIN) 1000 MCG tablet Take 1,000 mcg by mouth 2 (two) times daily.    . bisacodyl (DULCOLAX) 10 MG suppository Place  1 suppository (10 mg total) rectally daily as needed for moderate constipation. (Patient not taking: Reported on 03/23/2017) 12 suppository 0  . Histamine Dihydrochloride (AUSTRALIAN DREAM ARTHRITIS EX) Apply 1 application topically 2 (two) times daily as needed (back pain).     Marland Kitchen lidocaine-prilocaine (EMLA) cream Apply to port 1 hours prior to chemotherapy appointment. Cover with plastic wrap. (Patient not taking: Reported on 03/17/2017) 30 g 3  . ondansetron (ZOFRAN) 8 MG tablet Take 1 tablet (8 mg total) by mouth 2 (two) times daily. (Patient not taking: Reported on 03/23/2017) 30 tablet 3  . oxybutynin (DITROPAN-XL) 5 MG 24 hr tablet TAKE 1 TABLET BY MOUTH AT  BEDTIME (Patient not taking: Reported on 03/23/2017) 90 tablet 1  . prochlorperazine (COMPAZINE) 10 MG tablet Take 1 tablet (10 mg total) by mouth every 6 (six) hours as needed for nausea or vomiting. (Patient not taking: Reported on 03/23/2017) 30 tablet 3   No current facility-administered medications for this visit.     OBJECTIVE: Vitals:   03/23/17 1021  BP:  129/73  Pulse: 94  Resp: 18  Temp: (!) 95.9 F (35.5 C)     Body mass index is 29.02 kg/m.    ECOG FS:1 - Symptomatic but completely ambulatory  General: Well-developed, well-nourished, no acute distress. Eyes: Pink conjunctiva, anicteric sclera. Lungs: Clear to auscultation bilaterally. Heart: Regular rate and rhythm. No rubs, murmurs, or gallops. Abdomen: Well-healed surgical scar.  Soft, nontender, nondistended. No organomegaly noted, normoactive bowel sounds. Musculoskeletal: No edema, cyanosis, or clubbing. Neuro: Alert, answering all questions appropriately. Cranial nerves grossly intact. Skin: No rashes or petechiae noted. Psych: Normal affect.  LAB RESULTS:  Lab Results  Component Value Date   NA 133 (L) 02/16/2017   K 4.6 02/16/2017   CL 100 (L) 02/16/2017   CO2 24 02/16/2017   GLUCOSE 257 (H) 02/16/2017   BUN 18 02/16/2017   CREATININE 1.18 02/16/2017   CALCIUM 8.6 (L) 02/16/2017   PROT 6.8 02/16/2017   ALBUMIN 2.7 (L) 02/16/2017   AST 40 02/16/2017   ALT 44 02/16/2017   ALKPHOS 588 (H) 02/16/2017   BILITOT 0.4 02/16/2017   GFRNONAA 57 (L) 02/16/2017   GFRAA >60 02/16/2017    Lab Results  Component Value Date   WBC 11.8 (H) 02/16/2017   NEUTROABS 9.8 (H) 11/05/2016   HGB 10.8 (L) 02/16/2017   HCT 33.4 (L) 02/16/2017   MCV 85.0 02/16/2017   PLT 232 02/16/2017     STUDIES: Ct Abdomen Pelvis W Contrast  Result Date: 03/06/2017 CLINICAL DATA:  Gallbladder cancer, status post subtotal cholecystectomy EXAM: CT ABDOMEN AND PELVIS WITH CONTRAST TECHNIQUE: Multidetector CT imaging of the abdomen and pelvis was performed using the standard protocol following bolus administration of intravenous contrast. CONTRAST:  127m ISOVUE-300 IOPAMIDOL (ISOVUE-300) INJECTION 61% COMPARISON:  CT abdomen/pelvis dated 09/17/2016 FINDINGS: Lower chest: Linear scarring/atelectasis at the lung bases. Hepatobiliary: Heterogeneous appearance of the liver with suspected multiple  hypoenhancing metastases, at least 8-10 in number. For example, there is a 12 mm lesion in segment 4A (series 3/image 19) and an 11 mm lesion inferiorly in segment 6 (series 3/image 37). Status post subtotal cholecystectomy with residual gallbladder parenchyma inferiorly in the gallbladder fossa (series 3/image 32). Mild abnormal soft tissue in the porta hepatis, including a discrete 13 mm short axis node (series 3/image 34). Mild periportal edema. Pancreas: Within normal limits. Spleen: Within normal limits. Adrenals/Urinary Tract: 4.2 x 5.3 cm right adrenal mass (series 3/image 26), previously 3.3 x 4.1  cm, compatible with metastatic disease given interval change. 2.5 x 1.8 cm left adrenal metastasis (series 3/image 33), new. Right renal cysts, including a dominant 9.7 cm right upper pole simple cyst (series 3/image 32) and a 2.0 cm hemorrhagic cyst in the posterior interpolar region (series 3/image 41), grossly unchanged. Left renal cysts, including a dominant 3.9 cm posterior left upper pole simple cyst (series 3/image 36) and a 13 mm hemorrhagic cyst in the anterior left lower kidney (series 3/image 49), unchanged. No hydronephrosis. Bladder is mildly thick-walled anteriorly although underdistended. Stomach/Bowel: Stomach is notable for a tiny hiatal hernia. Status post ileocecal resection. No evidence of bowel obstruction. Moderate colonic stool burden. Suspected adherence of a loop of right to the residual gallbladder in the right upper abdomen (coronal image 37), with associated abnormal soft tissue in this region. Vascular/Lymphatic: No evidence of abdominal aortic aneurysm. Small upper abdominal lymph nodes, including the 13 mm short axis node in the porta hepatis (noted above) and an 11 mm portacaval node (series 3/image 34). Additional subcentimeter nodes in the hepatic duodenal ligament. These are all worrisome for nodal metastases. Additionally, there is mild peritoneal studding in the anterior right  mid abdomen (series 3/image 41), worrisome for peritoneal disease. Reproductive: Prostatomegaly. Other: Trace pelvic fluid. Small to moderate bilateral inguinal hernias, left greater than right. Musculoskeletal: Degenerative changes of the visualized thoracolumbar spine. IMPRESSION: Status post subtotal cholecystectomy. Direct tumor involvement of a loop of right colon in the right upper abdomen. Additional mild abnormal soft tissue in the porta hepatis. Multifocal hepatic metastases. Right adrenal metastasis, increased.  Left adrenal metastasis, new. Suspected peritoneal disease and upper abdominal nodal metastases, as described above. Trace pelvic ascites. Electronically Signed   By: Julian Hy M.D.   On: 03/06/2017 08:14   Nm Pet Image Initial (pi) Skull Base To Thigh  Result Date: 03/19/2017 CLINICAL DATA:  Subsequent treatment strategy for gallbladder adenocarcinoma. Prior subtotal cholecystectomy with biopsies of the gallbladder. EXAM: NUCLEAR MEDICINE PET SKULL BASE TO THIGH TECHNIQUE: 0.6 mCi F-18 FDG was injected intravenously. Full-ring PET imaging was performed from the skull base to thigh after the radiotracer. CT data was obtained and used for attenuation correction and anatomic localization. FASTING BLOOD GLUCOSE:  Value: 142 mg/dl COMPARISON:  CT abdomen 03/05/2017 FINDINGS: NECK No hypermetabolic lymph nodes in the neck. Bilateral carotid atherosclerotic calcification. Slight effacement of the right piriform sinus may be incidental, there is only low-grade activity in this vicinity with maximum SUV 3.5. CHEST Right pericardial node 0.9 cm in short axis on image 126/3, maximum SUV 1.8. There is a band of density in the right upper lobe measuring up to 2.1 cm in thickness medially, with maximum SUV 1.9. Mild atelectasis is present in both lower lobes as well. A total of 5 pulmonary nodules are present in the right upper lobe and right middle lobe. One of the largest is a 7 by 6 mm right  upper lobe nodule on image 82/3. No definite accentuated metabolic activity but these lesions are below sensitive PET-CT size thresholds. Coronary, aortic arch, and branch vessel atherosclerotic vascular disease. Trace right pleural effusion. ABDOMEN/PELVIS Some of the previously identified liver lesions are hypermetabolic compatible with metastatic lesions to the liver. An index lesion anteriorly in segment 4 of the liver measures about 2.5 by 1.7 cm and has a maximum SUV of 6.0. Most of the lesions in the liver are smaller and because of their small size have a variable degree of conspicuity with respect to metabolic activity,  but I suspect that they are tender more lesions in the liver. Fluid density structure in the gallbladder fossa likely reflecting residual gallbladder, with abnormal hypermetabolic soft tissue density tracking along the anterior margin of the gallbladder fossa and anterior peritoneal margin compatible with omental spread of metastatic disease, maximum SUV in this vicinity 6.4. The lower margin of the cystic lesion has accentuated metabolic activity, maximum SUV 3.9. Mixed density right adrenal mass measures 4.8 by 3.9 cm on image 147/3 and has significant hypermetabolic portions, maximum SUV 8.1 Left adrenal mass measures 2.4 by 1.8 cm on image 161/3 and is hypermetabolic with maximum SUV 7.8. Small hypermetabolic peripancreatic lymph nodes are present along with some accentuated activity along the pancreaticoduodenal groove and in the stranding along the porta hepatis, making it difficult to exclude adenopathy or tumor infiltration. An index peripancreatic node measuring about 1.1 cm in diameter on image 159/3 has a maximum SUV of 5.3. Bilateral photopenic renal cysts. The 2.0 cm complex exophytic lesion from the right mid kidney posteriorly on image 174/3 appears photopenic and accordingly is likely a complex cyst. Low-grade accentuated metabolic activity in a left periaortic lymph nodes  including a 0.7 cm node on image 177/3 with maximum SUV 3.8. Small foci of nodularity along the anterior omentum are observed with faintly accentuated metabolic activity suspicious for additional early omental tumor deposition. A small mesenteric deposit measuring 0.8 by 1.5 cm on image 194/3 has maximum SUV of 2.9. Small amount of right-sided ascites in the paracolic gutter and central upper pelvic mesentery. Moderate prostatomegaly without significant prostate hypermetabolic activity. Bilateral inguinal hernias containing adipose tissue, larger on the left than the right. Small bilateral scrotal hydroceles. Aortoiliac atherosclerotic vascular disease. SKELETON No focal hypermetabolic activity to suggest skeletal metastasis. IMPRESSION: 1. Hypermetabolic metastatic disease in the liver, bilateral adrenal glands, and omentum/peritoneum. There is omental caking of tumor just below the gallbladder fossa and omental nodularity elsewhere. Low-grade abnormal hypermetabolic activity in lymph nodes along the right gastric, porta hepatis, peripancreatic, and periaortic chains compatible with malignancy. There is some low-grade activity in a pericardial lymph node as well. 2. Multiple small lung nodules in the right lung are for the most part below sensitive PET-CT size thresholds. There is a band of atelectasis in the right upper lobe with mild hypermetabolic activity (maximum SUV 1.9), and underlying metastatic pulmonary nodule associated with atelectasis is not readily excluded. 3. Other imaging findings of potential clinical significance: Aortic Atherosclerosis (ICD10-I70.0). Trace right pleural effusion. Coronary atherosclerosis. Photopenic bilateral renal cysts are likely benign, including the complex cyst of the right mid kidney posteriorly. Small amount of ascites. Moderate prostatomegaly. Bilateral inguinal hernias containing adipose tissue. Bilateral scrotal hydroceles. Electronically Signed   By: Van Clines M.D.   On: 03/19/2017 11:51    ASSESSMENT: Adenocarcinoma of gallbladder.  PLAN:    1. Adenocarcinoma of gallbladder: PET scan results reviewed independently and report as above confirming stage IV disease with omental and liver metastasis.  Despite patient's decreased performance status, he wishes to attempt palliative chemotherapy using gemcitabine and cisplatin on day 1 with gemcitabine only on days 8 and 15.  This will be a 28-day cycle.  Patient has had port placement.  Return to clinic as previously scheduled on March 30, 2017 to initiate cycle 1, day 1 of treatment. 2.  Poor appetite: Patient has a dietary appointment next week.   3.  Hyperglycemia: Monitor closely as patient will be getting dexamethasone as one of his premedications. 4.  Pain: Patient  was given a prescription for Vicodin today.  Approximately 30 minutes was spent in discussion of which greater than 50% was consultation.  Patient expressed understanding and was in agreement with this plan. He also understands that He can call clinic at any time with any questions, concerns, or complaints.   Cancer Staging Adenocarcinoma of gallbladder [facility]) Staging form: Gallbladder, AJCC 8th Edition - Clinical stage from 03/03/2017: Stage IVB (cT3, cN0, cM1) - Signed by Lloyd Huger, MD on 03/23/2017   Lloyd Huger, MD   03/23/2017 12:54 PM

## 2017-03-23 ENCOUNTER — Inpatient Hospital Stay (HOSPITAL_BASED_OUTPATIENT_CLINIC_OR_DEPARTMENT_OTHER): Payer: Medicare Other | Admitting: Oncology

## 2017-03-23 ENCOUNTER — Other Ambulatory Visit: Payer: Self-pay

## 2017-03-23 VITALS — BP 129/73 | HR 94 | Temp 95.9°F | Resp 18 | Wt 196.5 lb

## 2017-03-23 DIAGNOSIS — Z5111 Encounter for antineoplastic chemotherapy: Secondary | ICD-10-CM | POA: Diagnosis not present

## 2017-03-23 DIAGNOSIS — K4020 Bilateral inguinal hernia, without obstruction or gangrene, not specified as recurrent: Secondary | ICD-10-CM | POA: Diagnosis not present

## 2017-03-23 DIAGNOSIS — M129 Arthropathy, unspecified: Secondary | ICD-10-CM

## 2017-03-23 DIAGNOSIS — C23 Malignant neoplasm of gallbladder: Secondary | ICD-10-CM | POA: Diagnosis not present

## 2017-03-23 DIAGNOSIS — N281 Cyst of kidney, acquired: Secondary | ICD-10-CM

## 2017-03-23 DIAGNOSIS — G8929 Other chronic pain: Secondary | ICD-10-CM

## 2017-03-23 DIAGNOSIS — R630 Anorexia: Secondary | ICD-10-CM

## 2017-03-23 DIAGNOSIS — R739 Hyperglycemia, unspecified: Secondary | ICD-10-CM | POA: Diagnosis not present

## 2017-03-23 DIAGNOSIS — R188 Other ascites: Secondary | ICD-10-CM

## 2017-03-23 DIAGNOSIS — E279 Disorder of adrenal gland, unspecified: Secondary | ICD-10-CM

## 2017-03-23 DIAGNOSIS — I700 Atherosclerosis of aorta: Secondary | ICD-10-CM

## 2017-03-23 DIAGNOSIS — M549 Dorsalgia, unspecified: Secondary | ICD-10-CM | POA: Diagnosis not present

## 2017-03-23 DIAGNOSIS — N400 Enlarged prostate without lower urinary tract symptoms: Secondary | ICD-10-CM | POA: Diagnosis not present

## 2017-03-23 DIAGNOSIS — I2510 Atherosclerotic heart disease of native coronary artery without angina pectoris: Secondary | ICD-10-CM

## 2017-03-23 DIAGNOSIS — K219 Gastro-esophageal reflux disease without esophagitis: Secondary | ICD-10-CM

## 2017-03-23 DIAGNOSIS — N433 Hydrocele, unspecified: Secondary | ICD-10-CM | POA: Diagnosis not present

## 2017-03-23 DIAGNOSIS — G4730 Sleep apnea, unspecified: Secondary | ICD-10-CM

## 2017-03-23 DIAGNOSIS — I10 Essential (primary) hypertension: Secondary | ICD-10-CM

## 2017-03-23 DIAGNOSIS — E7800 Pure hypercholesterolemia, unspecified: Secondary | ICD-10-CM | POA: Diagnosis not present

## 2017-03-23 DIAGNOSIS — J90 Pleural effusion, not elsewhere classified: Secondary | ICD-10-CM

## 2017-03-23 DIAGNOSIS — Z9049 Acquired absence of other specified parts of digestive tract: Secondary | ICD-10-CM

## 2017-03-23 DIAGNOSIS — Z79899 Other long term (current) drug therapy: Secondary | ICD-10-CM

## 2017-03-23 DIAGNOSIS — F329 Major depressive disorder, single episode, unspecified: Secondary | ICD-10-CM

## 2017-03-23 DIAGNOSIS — C787 Secondary malignant neoplasm of liver and intrahepatic bile duct: Secondary | ICD-10-CM

## 2017-03-23 DIAGNOSIS — Z87442 Personal history of urinary calculi: Secondary | ICD-10-CM

## 2017-03-23 DIAGNOSIS — C786 Secondary malignant neoplasm of retroperitoneum and peritoneum: Secondary | ICD-10-CM | POA: Diagnosis not present

## 2017-03-23 DIAGNOSIS — F419 Anxiety disorder, unspecified: Secondary | ICD-10-CM

## 2017-03-23 MED ORDER — HYDROCODONE-ACETAMINOPHEN 5-325 MG PO TABS: 1.0000 | ORAL_TABLET | ORAL | 0 refills | Status: DC | PRN

## 2017-03-23 NOTE — Progress Notes (Signed)
Here for follow up. Lost wife last Aug-feeling sad w no appetite . Having soft formed stools x 2 day-up to 8 x BM yesterday. Given counseling info

## 2017-03-23 NOTE — Progress Notes (Signed)
Met with Mr. Nickles pre treatment. He is alone today. His son Lanny Hurst, is coming in from [place] for his first chemotherapy treatment next week. Mr. Zeoli reports his weakness his still present but is improving slowly. He continues with no appetite, stating food does not have any taste. He last lost 3lb since the end of January. He is making himself  eat and is drinking ensure/boost. He is completely all of his ADL's and also does some light shopping. His gait is steady and he denies being unstable. He lives at [facility] and has a Chartered loss adjuster for service" plan. He also has a necklace he wears that can alert staff if he needs anyone. He has the option to go to a higher level of service but will have to pay out of pocket. He states he does feel like he needs a higher level of service at this time and would like to see how he does with his first treatment. We reviewed his appointments for next week as well as his medications. He knows to call with any questions or concerns. I asked if he would like for me to update Lanny Hurst on treatment plan and he does. I will call Lanny Hurst and update him. Oncology Nurse Navigator Documentation  Navigator Location: [facility]Med Onc (03/23/17 1100)   )Navigator Encounter Type: Follow-up Appt (03/23/17 1100)                     Patient Visit Type: MedOnc (03/23/17 1100) Treatment Phase: Pre-Tx/Tx Discussion (03/23/17 1100)                            Time Spent with Patient: 30 (03/23/17 1100)

## 2017-03-27 ENCOUNTER — Other Ambulatory Visit: Payer: Self-pay | Admitting: Family Medicine

## 2017-03-28 NOTE — Progress Notes (Signed)
[place]  [phone]) [phone] [phone]) [phone]  ID: Thomas David OB: 1936/01/29  MR#: [id number]  ZHG#:[id number]  Patient Care Team: Leone Haven, MD as PCP - General (Family Medicine) Kathyrn Drown, MD (Family Medicine) Clent Jacks, RN as Registered Nurse  CHIEF COMPLAINT: Adenocarcinoma of gallbladder.  INTERVAL HISTORY: Patient returns to clinic today for further evaluation and consideration of cycle 1, day 1 of cisplatin and gemcitabine.  He continues to have a decreased performance status, but this continues to improve.  He states his appetite has slightly improved.  He continues to live alone and complete all of his ADLs.  He continues to have intermittent abdominal pain.  He has no neurologic complaints.  He denies any recent fevers or illnesses. He has intermittent nausea, but denies any vomiting, constipation, or diarrhea.  He has no urinary complaints.  Patient otherwise feels well and offers no further specific complaints.  REVIEW OF SYSTEMS:   Review of Systems  Constitutional: Positive for malaise/fatigue. Negative for fever and weight loss.  Respiratory: Negative.  Negative for cough and shortness of breath.   Cardiovascular: Negative for chest pain and leg swelling.  Gastrointestinal: Positive for abdominal pain and nausea.  Genitourinary: Negative for dysuria.  Musculoskeletal: Negative.   Skin: Negative.  Negative for rash.  Neurological: Positive for weakness.  Psychiatric/Behavioral: The patient is nervous/anxious.     As per HPI. Otherwise, a complete review of systems is negative.  PAST MEDICAL HISTORY: Past Medical History:  Diagnosis Date  . Anxiety   . Arthritis   . Chronic lower back pain   . Crohn's disease ([place])   . Depression   . Difficult intubation    patient denies on 02/12/17  . GERD (gastroesophageal reflux disease)   . Gout   . History of kidney stones 40 years ago  . Hypercholesteremia   .  Hypertension   . Prediabetes   . Sleep apnea    cpap     PAST SURGICAL HISTORY: Past Surgical History:  Procedure Laterality Date  . CHOLECYSTECTOMY N/A 11/03/2016   Procedure: LAPAROSCOPIC CHOLECYSTostomy with tube placement ;  Surgeon: Johnathan Hausen, MD;  Location: [facility];  Service: General;  Laterality: N/A;  . CHOLECYSTECTOMY N/A 02/15/2017   Procedure: LAPAROSCOPIC SUBTOTAL CHOLECYSTECTOMY, BIOPSY OF GALLBLADDER;  Surgeon: Johnathan Hausen, MD;  Location: [facility];  Service: General;  Laterality: N/A;  . COLECTOMY  1973   "part of large intestines" (05/04/2012)  . COLON RESECTION    . COLONOSCOPY    . COLONOSCOPY N/A 05/23/2014   Procedure: COLONOSCOPY;  Surgeon: Rogene Houston, MD;  [facility];  Service: Endoscopy;  Laterality: N/A;  1200  . ESOPHAGEAL DILATION N/A 05/23/2014   Procedure: ESOPHAGEAL DILATION;  Surgeon: Rogene Houston, MD;  [facility];  Service: Endoscopy;  Laterality: N/A;  . ESOPHAGOGASTRODUODENOSCOPY N/A 03/16/2014   Procedure: esophageal dilation ;  Surgeon: Rogene Houston, MD;  [facility];  Service: Endoscopy;  Laterality: N/A;  . ESOPHAGOGASTRODUODENOSCOPY N/A 05/23/2014   Procedure: ESOPHAGOGASTRODUODENOSCOPY (EGD);  Surgeon: Rogene Houston, MD;  [facility];  Service: Endoscopy;  Laterality: N/A;  . ESOPHAGOGASTRODUODENOSCOPY (EGD) WITH PROPOFOL N/A 02/18/2017   Procedure: ESOPHAGOGASTRODUODENOSCOPY (EGD) WITH PROPOFOL;  Surgeon: Wonda Horner, MD;  Location: [facility];  Service: Endoscopy;  Laterality: N/A;  . [place]  . IR CHOLANGIOGRAM EXISTING TUBE  11/26/2016  . KIDNEY STONE SURGERY  1970's  . PARTIAL COLECTOMY  ~  40 years ago   some of large and small  . PORTA CATH INSERTION N/A 03/17/2017   Procedure: PORTA CATH INSERTION;  Surgeon: Katha Cabal, MD;  Location: [facility];  Service: Cardiovascular;  Laterality: N/A;  . PROSTATE SURGERY    . THYROIDECTOMY Right  05/04/2012   Procedure: RIGHT HEMI THYROIDECTOMY;  Surgeon: Ascencion Dike, MD;  Location: [facility];  Service: ENT;  Laterality: Right;  . THYROIDECTOMY    . TONSILLECTOMY AND ADENOIDECTOMY     as a child  . TRANSURETHRAL PROSTATECTOMY WITH GYRUS INSTRUMENTS N/A 07/11/2012   Procedure: TRANSURETHRAL PROSTATECTOMY WITH GYRUS INSTRUMENTS;  Surgeon: Claybon Jabs, MD;  Location: [facility];  Service: Urology;  Laterality: N/A;  . UPPER GASTROINTESTINAL ENDOSCOPY      FAMILY HISTORY: Family History  Problem Relation Age of Onset  . Diabetes Mother   . Stroke Father   . Healthy Daughter   . Healthy Son   . Heart disease Sister     ADVANCED DIRECTIVES (Y/N):  N  HEALTH MAINTENANCE: Social History   Tobacco Use  . Smoking status: Never Smoker  . Smokeless tobacco: Never Used  Substance Use Topics  . Alcohol use: No    Alcohol/week: 0.0 oz  . Drug use: No     Colonoscopy:  PAP:  Bone density:  Lipid panel:  No Known Allergies  Current Outpatient Medications  Medication Sig Dispense Refill  . aspirin EC 81 MG tablet Take 81 mg by mouth at bedtime.    . fluticasone (FLONASE) 50 MCG/ACT nasal spray USE 1 SPRAY INTO BOTH  NOSTRILS DAILY (Patient taking differently: USE 1 SPRAY INTO BOTH  NOSTRILS AT NIGHT) 32 g 0  . HYDROcodone-acetaminophen (NORCO/VICODIN) 5-325 MG tablet Take 1-2 tablets by mouth every 4 (four) hours as needed for moderate pain. 30 tablet 0  . lidocaine-prilocaine (EMLA) cream Apply to port 1 hours prior to chemotherapy appointment. Cover with plastic wrap. 30 g 3  . mesalamine (PENTASA) 500 MG CR capsule Take 1 capsule (500 mg total) by mouth 4 (four) times daily. (Patient taking differently: Take 1,000 mg by mouth 2 (two) times daily. ) 360 capsule 3  . Multiple Vitamin (MULTIVITAMIN WITH MINERALS) TABS tablet Take 2 tablets by mouth daily.    . Probiotic Product (PHILLIPS COLON HEALTH PO) Take 1 tablet by mouth daily with supper.     . vitamin B-12 (CYANOCOBALAMIN) 1000  MCG tablet Take 1,000 mcg by mouth 2 (two) times daily.    Marland Kitchen acetaminophen (TYLENOL) 500 MG tablet Take 1,000 mg by mouth every 4 (four) hours as needed for moderate pain or headache.     . allopurinol (ZYLOPRIM) 300 MG tablet Take 1 tablet (300 mg total) by mouth every morning. 90 tablet 3  . amLODipine (NORVASC) 5 MG tablet TAKE 1 TABLET BY MOUTH  DAILY 90 tablet 3  . bisacodyl (DULCOLAX) 10 MG suppository Place 1 suppository (10 mg total) rectally daily as needed for moderate constipation. (Patient not taking: Reported on 03/23/2017) 12 suppository 0  . fentaNYL (DURAGESIC - DOSED MCG/HR) 25 MCG/HR patch Place 1 patch (25 mcg total) onto the skin every 3 (three) days. 10 patch 0  . Histamine Dihydrochloride (AUSTRALIAN DREAM ARTHRITIS EX) Apply 1 application topically 2 (two) times daily as needed (back pain).     Marland Kitchen losartan (COZAAR) 100 MG tablet Take 1 tablet (100 mg total) by mouth daily. 90 tablet 3  . megestrol (MEGACE) 40 MG tablet Take 1 tablet (  40 mg total) by mouth daily. 30 tablet 2  . metoprolol tartrate (LOPRESSOR) 50 MG tablet Take 1 tablet (50 mg total) by mouth 2 (two) times daily. 180 tablet 3  . omeprazole (PRILOSEC) 20 MG capsule Take 1 capsule (20 mg total) by mouth daily. 90 capsule 3  . ondansetron (ZOFRAN) 8 MG tablet Take 1 tablet (8 mg total) by mouth 2 (two) times daily. (Patient not taking: Reported on 03/23/2017) 30 tablet 3  . oxybutynin (DITROPAN-XL) 5 MG 24 hr tablet Take 1 tablet (5 mg total) by mouth at bedtime. 90 tablet 1  . prochlorperazine (COMPAZINE) 10 MG tablet Take 1 tablet (10 mg total) by mouth every 6 (six) hours as needed for nausea or vomiting. (Patient not taking: Reported on 03/23/2017) 30 tablet 3  . simvastatin (ZOCOR) 20 MG tablet Take 1 tablet (20 mg total) by mouth daily. 90 tablet 3   No current facility-administered medications for this visit.     OBJECTIVE: Vitals:   03/30/17 0832  BP: 126/73  Pulse: 78  Resp: 18  Temp: (!) 94.9 F (34.9  C)     Body mass index is 29.05 kg/m.    ECOG FS:1 - Symptomatic but completely ambulatory  General: Well-developed, well-nourished, no acute distress. Eyes: Pink conjunctiva, anicteric sclera. Lungs: Clear to auscultation bilaterally. Heart: Regular rate and rhythm. No rubs, murmurs, or gallops. Abdomen: Well-healed surgical scar.  Soft, nontender, nondistended. No organomegaly noted, normoactive bowel sounds. Musculoskeletal: No edema, cyanosis, or clubbing. Neuro: Alert, answering all questions appropriately. Cranial nerves grossly intact. Skin: No rashes or petechiae noted. Psych: Normal affect.  LAB RESULTS:  Lab Results  Component Value Date   NA 128 (L) 03/30/2017   K 4.2 03/30/2017   CL 93 (L) 03/30/2017   CO2 26 03/30/2017   GLUCOSE 227 (H) 03/30/2017   BUN 13 03/30/2017   CREATININE 0.72 03/30/2017   CALCIUM 8.9 03/30/2017   PROT 7.7 03/30/2017   ALBUMIN 3.3 (L) 03/30/2017   AST 65 (H) 03/30/2017   ALT 68 (H) 03/30/2017   ALKPHOS 905 (H) 03/30/2017   BILITOT 0.7 03/30/2017   GFRNONAA >60 03/30/2017   GFRAA >60 03/30/2017    Lab Results  Component Value Date   WBC 11.8 (H) 03/30/2017   NEUTROABS 9.5 (H) 03/30/2017   HGB 11.9 (L) 03/30/2017   HCT 35.5 (L) 03/30/2017   MCV 85.4 03/30/2017   PLT 320 03/30/2017     STUDIES: Ct Abdomen Pelvis W Contrast  Result Date: 03/06/2017 CLINICAL DATA:  Gallbladder cancer, status post subtotal cholecystectomy EXAM: CT ABDOMEN AND PELVIS WITH CONTRAST TECHNIQUE: Multidetector CT imaging of the abdomen and pelvis was performed using the standard protocol following bolus administration of intravenous contrast. CONTRAST:  160m ISOVUE-300 IOPAMIDOL (ISOVUE-300) INJECTION 61% COMPARISON:  CT abdomen/pelvis dated 09/17/2016 FINDINGS: Lower chest: Linear scarring/atelectasis at the lung bases. Hepatobiliary: Heterogeneous appearance of the liver with suspected multiple hypoenhancing metastases, at least 8-10 in number. For  example, there is a 12 mm lesion in segment 4A (series 3/image 19) and an 11 mm lesion inferiorly in segment 6 (series 3/image 37). Status post subtotal cholecystectomy with residual gallbladder parenchyma inferiorly in the gallbladder fossa (series 3/image 32). Mild abnormal soft tissue in the porta hepatis, including a discrete 13 mm short axis node (series 3/image 34). Mild periportal edema. Pancreas: Within normal limits. Spleen: Within normal limits. Adrenals/Urinary Tract: 4.2 x 5.3 cm right adrenal mass (series 3/image 26), previously 3.3 x 4.1 cm, compatible with metastatic disease  given interval change. 2.5 x 1.8 cm left adrenal metastasis (series 3/image 33), new. Right renal cysts, including a dominant 9.7 cm right upper pole simple cyst (series 3/image 32) and a 2.0 cm hemorrhagic cyst in the posterior interpolar region (series 3/image 41), grossly unchanged. Left renal cysts, including a dominant 3.9 cm posterior left upper pole simple cyst (series 3/image 36) and a 13 mm hemorrhagic cyst in the anterior left lower kidney (series 3/image 49), unchanged. No hydronephrosis. Bladder is mildly thick-walled anteriorly although underdistended. Stomach/Bowel: Stomach is notable for a tiny hiatal hernia. Status post ileocecal resection. No evidence of bowel obstruction. Moderate colonic stool burden. Suspected adherence of a loop of right to the residual gallbladder in the right upper abdomen (coronal image 37), with associated abnormal soft tissue in this region. Vascular/Lymphatic: No evidence of abdominal aortic aneurysm. Small upper abdominal lymph nodes, including the 13 mm short axis node in the porta hepatis (noted above) and an 11 mm portacaval node (series 3/image 34). Additional subcentimeter nodes in the hepatic duodenal ligament. These are all worrisome for nodal metastases. Additionally, there is mild peritoneal studding in the anterior right mid abdomen (series 3/image 41), worrisome for  peritoneal disease. Reproductive: Prostatomegaly. Other: Trace pelvic fluid. Small to moderate bilateral inguinal hernias, left greater than right. Musculoskeletal: Degenerative changes of the visualized thoracolumbar spine. IMPRESSION: Status post subtotal cholecystectomy. Direct tumor involvement of a loop of right colon in the right upper abdomen. Additional mild abnormal soft tissue in the porta hepatis. Multifocal hepatic metastases. Right adrenal metastasis, increased.  Left adrenal metastasis, new. Suspected peritoneal disease and upper abdominal nodal metastases, as described above. Trace pelvic ascites. Electronically Signed   By: Julian Hy M.D.   On: 03/06/2017 08:14   Nm Pet Image Initial (pi) Skull Base To Thigh  Result Date: 03/19/2017 CLINICAL DATA:  Subsequent treatment strategy for gallbladder adenocarcinoma. Prior subtotal cholecystectomy with biopsies of the gallbladder. EXAM: NUCLEAR MEDICINE PET SKULL BASE TO THIGH TECHNIQUE: 0.6 mCi F-18 FDG was injected intravenously. Full-ring PET imaging was performed from the skull base to thigh after the radiotracer. CT data was obtained and used for attenuation correction and anatomic localization. FASTING BLOOD GLUCOSE:  Value: 142 mg/dl COMPARISON:  CT abdomen 03/05/2017 FINDINGS: NECK No hypermetabolic lymph nodes in the neck. Bilateral carotid atherosclerotic calcification. Slight effacement of the right piriform sinus may be incidental, there is only low-grade activity in this vicinity with maximum SUV 3.5. CHEST Right pericardial node 0.9 cm in short axis on image 126/3, maximum SUV 1.8. There is a band of density in the right upper lobe measuring up to 2.1 cm in thickness medially, with maximum SUV 1.9. Mild atelectasis is present in both lower lobes as well. A total of 5 pulmonary nodules are present in the right upper lobe and right middle lobe. One of the largest is a 7 by 6 mm right upper lobe nodule on image 82/3. No definite  accentuated metabolic activity but these lesions are below sensitive PET-CT size thresholds. Coronary, aortic arch, and branch vessel atherosclerotic vascular disease. Trace right pleural effusion. ABDOMEN/PELVIS Some of the previously identified liver lesions are hypermetabolic compatible with metastatic lesions to the liver. An index lesion anteriorly in segment 4 of the liver measures about 2.5 by 1.7 cm and has a maximum SUV of 6.0. Most of the lesions in the liver are smaller and because of their small size have a variable degree of conspicuity with respect to metabolic activity, but I suspect that they  are tender more lesions in the liver. Fluid density structure in the gallbladder fossa likely reflecting residual gallbladder, with abnormal hypermetabolic soft tissue density tracking along the anterior margin of the gallbladder fossa and anterior peritoneal margin compatible with omental spread of metastatic disease, maximum SUV in this vicinity 6.4. The lower margin of the cystic lesion has accentuated metabolic activity, maximum SUV 3.9. Mixed density right adrenal mass measures 4.8 by 3.9 cm on image 147/3 and has significant hypermetabolic portions, maximum SUV 8.1 Left adrenal mass measures 2.4 by 1.8 cm on image 161/3 and is hypermetabolic with maximum SUV 7.8. Small hypermetabolic peripancreatic lymph nodes are present along with some accentuated activity along the pancreaticoduodenal groove and in the stranding along the porta hepatis, making it difficult to exclude adenopathy or tumor infiltration. An index peripancreatic node measuring about 1.1 cm in diameter on image 159/3 has a maximum SUV of 5.3. Bilateral photopenic renal cysts. The 2.0 cm complex exophytic lesion from the right mid kidney posteriorly on image 174/3 appears photopenic and accordingly is likely a complex cyst. Low-grade accentuated metabolic activity in a left periaortic lymph nodes including a 0.7 cm node on image 177/3 with  maximum SUV 3.8. Small foci of nodularity along the anterior omentum are observed with faintly accentuated metabolic activity suspicious for additional early omental tumor deposition. A small mesenteric deposit measuring 0.8 by 1.5 cm on image 194/3 has maximum SUV of 2.9. Small amount of right-sided ascites in the paracolic gutter and central upper pelvic mesentery. Moderate prostatomegaly without significant prostate hypermetabolic activity. Bilateral inguinal hernias containing adipose tissue, larger on the left than the right. Small bilateral scrotal hydroceles. Aortoiliac atherosclerotic vascular disease. SKELETON No focal hypermetabolic activity to suggest skeletal metastasis. IMPRESSION: 1. Hypermetabolic metastatic disease in the liver, bilateral adrenal glands, and omentum/peritoneum. There is omental caking of tumor just below the gallbladder fossa and omental nodularity elsewhere. Low-grade abnormal hypermetabolic activity in lymph nodes along the right gastric, porta hepatis, peripancreatic, and periaortic chains compatible with malignancy. There is some low-grade activity in a pericardial lymph node as well. 2. Multiple small lung nodules in the right lung are for the most part below sensitive PET-CT size thresholds. There is a band of atelectasis in the right upper lobe with mild hypermetabolic activity (maximum SUV 1.9), and underlying metastatic pulmonary nodule associated with atelectasis is not readily excluded. 3. Other imaging findings of potential clinical significance: Aortic Atherosclerosis (ICD10-I70.0). Trace right pleural effusion. Coronary atherosclerosis. Photopenic bilateral renal cysts are likely benign, including the complex cyst of the right mid kidney posteriorly. Small amount of ascites. Moderate prostatomegaly. Bilateral inguinal hernias containing adipose tissue. Bilateral scrotal hydroceles. Electronically Signed   By: Van Clines M.D.   On: 03/19/2017 11:51     ASSESSMENT: Adenocarcinoma of gallbladder.  PLAN:    1. Adenocarcinoma of gallbladder: PET scan results reviewed independently and report as above confirming stage IV disease with omental and liver metastasis.  Despite patient's decreased performance status, he wishes to attempt palliative chemotherapy using gemcitabine and cisplatin on day 1 with gemcitabine only on days 8 and 15.  This will be a 28-day cycle.  Patient has had port placement.  Proceed with cycle 1, day 1 today.  Return to clinic in 1 week for consideration of cycle 1, day 8. 2.  Poor appetite: Appreciate dietary input.  Patient was also given a prescription for Megace.   3.  Hyperglycemia: Monitor closely as patient will be getting dexamethasone as one of his premedications. 4.  Pain: Continue Vicodin.  Patient was given a prescription for 25 mcg fentanyl patch.  Approximately 30 minutes was spent in discussion of which greater than 50% was consultation.  Patient expressed understanding and was in agreement with this plan. He also understands that He can call clinic at any time with any questions, concerns, or complaints.   Cancer Staging Adenocarcinoma of gallbladder [facility]) Staging form: Gallbladder, AJCC 8th Edition - Clinical stage from 03/03/2017: Stage IVB (cT3, cN0, cM1) - Signed by Lloyd Huger, MD on 03/23/2017   Lloyd Huger, MD   04/02/2017 9:25 AM

## 2017-03-30 ENCOUNTER — Other Ambulatory Visit: Payer: Self-pay

## 2017-03-30 ENCOUNTER — Inpatient Hospital Stay (HOSPITAL_BASED_OUTPATIENT_CLINIC_OR_DEPARTMENT_OTHER): Payer: Medicare Other | Admitting: Oncology

## 2017-03-30 ENCOUNTER — Inpatient Hospital Stay: Payer: Medicare Other

## 2017-03-30 VITALS — BP 126/73 | HR 78 | Temp 94.9°F | Resp 18 | Wt 196.7 lb

## 2017-03-30 VITALS — BP 136/77 | HR 80

## 2017-03-30 DIAGNOSIS — C23 Malignant neoplasm of gallbladder: Secondary | ICD-10-CM

## 2017-03-30 DIAGNOSIS — N281 Cyst of kidney, acquired: Secondary | ICD-10-CM | POA: Diagnosis not present

## 2017-03-30 DIAGNOSIS — M129 Arthropathy, unspecified: Secondary | ICD-10-CM | POA: Diagnosis not present

## 2017-03-30 DIAGNOSIS — N433 Hydrocele, unspecified: Secondary | ICD-10-CM | POA: Diagnosis not present

## 2017-03-30 DIAGNOSIS — R188 Other ascites: Secondary | ICD-10-CM | POA: Diagnosis not present

## 2017-03-30 DIAGNOSIS — I2510 Atherosclerotic heart disease of native coronary artery without angina pectoris: Secondary | ICD-10-CM

## 2017-03-30 DIAGNOSIS — R630 Anorexia: Secondary | ICD-10-CM

## 2017-03-30 DIAGNOSIS — M549 Dorsalgia, unspecified: Secondary | ICD-10-CM | POA: Diagnosis not present

## 2017-03-30 DIAGNOSIS — G8929 Other chronic pain: Secondary | ICD-10-CM

## 2017-03-30 DIAGNOSIS — N400 Enlarged prostate without lower urinary tract symptoms: Secondary | ICD-10-CM | POA: Diagnosis not present

## 2017-03-30 DIAGNOSIS — I700 Atherosclerosis of aorta: Secondary | ICD-10-CM | POA: Diagnosis not present

## 2017-03-30 DIAGNOSIS — Z5111 Encounter for antineoplastic chemotherapy: Secondary | ICD-10-CM

## 2017-03-30 DIAGNOSIS — C787 Secondary malignant neoplasm of liver and intrahepatic bile duct: Secondary | ICD-10-CM | POA: Diagnosis not present

## 2017-03-30 DIAGNOSIS — R739 Hyperglycemia, unspecified: Secondary | ICD-10-CM

## 2017-03-30 DIAGNOSIS — E7800 Pure hypercholesterolemia, unspecified: Secondary | ICD-10-CM | POA: Diagnosis not present

## 2017-03-30 DIAGNOSIS — K4020 Bilateral inguinal hernia, without obstruction or gangrene, not specified as recurrent: Secondary | ICD-10-CM | POA: Diagnosis not present

## 2017-03-30 DIAGNOSIS — Z79899 Other long term (current) drug therapy: Secondary | ICD-10-CM

## 2017-03-30 DIAGNOSIS — I10 Essential (primary) hypertension: Secondary | ICD-10-CM | POA: Diagnosis not present

## 2017-03-30 DIAGNOSIS — J90 Pleural effusion, not elsewhere classified: Secondary | ICD-10-CM

## 2017-03-30 DIAGNOSIS — G4730 Sleep apnea, unspecified: Secondary | ICD-10-CM

## 2017-03-30 DIAGNOSIS — Z87442 Personal history of urinary calculi: Secondary | ICD-10-CM

## 2017-03-30 DIAGNOSIS — E279 Disorder of adrenal gland, unspecified: Secondary | ICD-10-CM

## 2017-03-30 DIAGNOSIS — F419 Anxiety disorder, unspecified: Secondary | ICD-10-CM

## 2017-03-30 DIAGNOSIS — K219 Gastro-esophageal reflux disease without esophagitis: Secondary | ICD-10-CM | POA: Diagnosis not present

## 2017-03-30 DIAGNOSIS — F329 Major depressive disorder, single episode, unspecified: Secondary | ICD-10-CM

## 2017-03-30 DIAGNOSIS — C786 Secondary malignant neoplasm of retroperitoneum and peritoneum: Secondary | ICD-10-CM

## 2017-03-30 DIAGNOSIS — Z9049 Acquired absence of other specified parts of digestive tract: Secondary | ICD-10-CM

## 2017-03-30 LAB — CBC WITH DIFFERENTIAL/PLATELET
Basophils Absolute: 0.1 10*3/uL (ref 0–0.1)
Basophils Relative: 1 %
EOS ABS: 0.1 10*3/uL (ref 0–0.7)
Eosinophils Relative: 1 %
HCT: 35.5 % — ABNORMAL LOW (ref 40.0–52.0)
HEMOGLOBIN: 11.9 g/dL — AB (ref 13.0–18.0)
LYMPHS ABS: 1 10*3/uL (ref 1.0–3.6)
Lymphocytes Relative: 8 %
MCH: 28.5 pg (ref 26.0–34.0)
MCHC: 33.4 g/dL (ref 32.0–36.0)
MCV: 85.4 fL (ref 80.0–100.0)
MONO ABS: 1.2 10*3/uL — AB (ref 0.2–1.0)
MONOS PCT: 10 %
NEUTROS PCT: 80 %
Neutro Abs: 9.5 10*3/uL — ABNORMAL HIGH (ref 1.4–6.5)
Platelets: 320 10*3/uL (ref 150–440)
RBC: 4.16 MIL/uL — ABNORMAL LOW (ref 4.40–5.90)
RDW: 16.6 % — ABNORMAL HIGH (ref 11.5–14.5)
WBC: 11.8 10*3/uL — ABNORMAL HIGH (ref 3.8–10.6)

## 2017-03-30 LAB — COMPREHENSIVE METABOLIC PANEL
ALBUMIN: 3.3 g/dL — AB (ref 3.5–5.0)
ALT: 68 U/L — AB (ref 17–63)
AST: 65 U/L — AB (ref 15–41)
Alkaline Phosphatase: 905 U/L — ABNORMAL HIGH (ref 38–126)
Anion gap: 9 (ref 5–15)
BUN: 13 mg/dL (ref 6–20)
CO2: 26 mmol/L (ref 22–32)
CREATININE: 0.72 mg/dL (ref 0.61–1.24)
Calcium: 8.9 mg/dL (ref 8.9–10.3)
Chloride: 93 mmol/L — ABNORMAL LOW (ref 101–111)
GFR calc non Af Amer: >60 mL/min (ref >60)
GLUCOSE: 227 mg/dL — AB (ref 65–99)
Potassium: 4.2 mmol/L (ref 3.5–5.1)
SODIUM: 128 mmol/L — AB (ref 135–145)
Total Bilirubin: 0.7 mg/dL (ref 0.3–1.2)
Total Protein: 7.7 g/dL (ref 6.5–8.1)

## 2017-03-30 MED ORDER — FOSAPREPITANT DIMEGLUMINE INJECTION 150 MG
Freq: Once | INTRAVENOUS | Status: AC
  Administered 2017-03-30: 12:00:00 via INTRAVENOUS
  Filled 2017-03-30: qty 5

## 2017-03-30 MED ORDER — MEGESTROL ACETATE 40 MG PO TABS
40.0000 mg | ORAL_TABLET | Freq: Every day | ORAL | 2 refills | Status: DC
Start: 2017-03-30 — End: 2017-05-18

## 2017-03-30 MED ORDER — POTASSIUM CHLORIDE 2 MEQ/ML IV SOLN
Freq: Once | INTRAVENOUS | Status: AC
  Administered 2017-03-30: 10:00:00 via INTRAVENOUS
  Filled 2017-03-30: qty 1000

## 2017-03-30 MED ORDER — HEPARIN SOD (PORK) LOCK FLUSH 100 UNIT/ML IV SOLN
500.0000 [IU] | Freq: Once | INTRAVENOUS | Status: AC | PRN
  Administered 2017-03-30: 500 [IU]
  Filled 2017-03-30 (×2): qty 5

## 2017-03-30 MED ORDER — SODIUM CHLORIDE 0.9 % IV SOLN
2000.0000 mg | Freq: Once | INTRAVENOUS | Status: AC
  Administered 2017-03-30: 2000 mg via INTRAVENOUS
  Filled 2017-03-30: qty 52.6

## 2017-03-30 MED ORDER — PALONOSETRON HCL INJECTION 0.25 MG/5ML
0.2500 mg | Freq: Once | INTRAVENOUS | Status: AC
  Administered 2017-03-30: 0.25 mg via INTRAVENOUS
  Filled 2017-03-30: qty 5

## 2017-03-30 MED ORDER — SODIUM CHLORIDE 0.9 % IV SOLN
70.0000 mg/m2 | Freq: Once | INTRAVENOUS | Status: AC
  Administered 2017-03-30: 147 mg via INTRAVENOUS
  Filled 2017-03-30: qty 147

## 2017-03-30 MED ORDER — SODIUM CHLORIDE 0.9 % IV SOLN
Freq: Once | INTRAVENOUS | Status: AC
  Administered 2017-03-30: 10:00:00 via INTRAVENOUS
  Filled 2017-03-30: qty 1000

## 2017-03-30 MED ORDER — SODIUM CHLORIDE 0.9% FLUSH
10.0000 mL | INTRAVENOUS | Status: DC | PRN
Start: 2017-03-30 — End: 2017-03-30
  Filled 2017-03-30: qty 10

## 2017-03-30 MED ORDER — FENTANYL 25 MCG/HR TD PT72: 25.0000 ug | MEDICATED_PATCH | TRANSDERMAL | 0 refills | Status: DC

## 2017-03-30 NOTE — Progress Notes (Signed)
Pt tolerated infusion well. Pt stable at discharge. 

## 2017-03-30 NOTE — Progress Notes (Signed)
Here for follow up. Stated having mid abd pain-intermittent,wakes him up 4 x ea night -stated hydrocodone helps.  In pain he stated this am-grimacing w eyes closed will report to Dr Grayland Ormond

## 2017-03-30 NOTE — Progress Notes (Signed)
Educated further on Duragesic patch. He is having increased pain today. Having episodes of feeling "hot and sweaty". He is afebrile and feels moist and cool. Son, Lanny Hurst, and his daughter in law are with him today for his first treatment. He has been evaluated by [name] this am. Oncology Nurse Navigator Documentation  Navigator Location: [facility] (03/30/17 0900)   )Navigator Encounter Type: Treatment (03/30/17 0900)                     Patient Visit Type: MedOnc (03/30/17 0900) Treatment Phase: First Chemo Tx (03/30/17 0900) Barriers/Navigation Needs: Education (03/30/17 0900)                          Time Spent with Patient: 30 (03/30/17 0900)

## 2017-03-31 ENCOUNTER — Other Ambulatory Visit: Payer: Self-pay

## 2017-03-31 LAB — CANCER ANTIGEN 19-9: CA 19-9: 12775 U/mL — ABNORMAL HIGH (ref 0–35)

## 2017-03-31 NOTE — Telephone Encounter (Signed)
Last filled 05/18/16 by Dr.Cook last OV 03/04/17

## 2017-03-31 NOTE — Telephone Encounter (Signed)
Last OV 03/04/17 last filled by Dr.Cook Omeprazole 03/30/16 90 3rf Simvastatin 03/09/16 90 3rf Lopressor 03/09/16 180 3rf Ditropan 07/29/16 90 1rf Zyloprim 03/09/16 90 3rf Cozaar 03/09/16 90 3rf

## 2017-04-01 ENCOUNTER — Inpatient Hospital Stay: Payer: Medicare Other

## 2017-04-01 MED ORDER — SIMVASTATIN 20 MG PO TABS: 20.0000 mg | ORAL_TABLET | Freq: Every day | ORAL | 3 refills | Status: DC

## 2017-04-01 MED ORDER — LOSARTAN POTASSIUM 100 MG PO TABS: 100.0000 mg | ORAL_TABLET | Freq: Every day | ORAL | 3 refills | Status: DC

## 2017-04-01 MED ORDER — OMEPRAZOLE 20 MG PO CPDR: 20.0000 mg | DELAYED_RELEASE_CAPSULE | Freq: Every day | ORAL | 3 refills | Status: DC

## 2017-04-01 MED ORDER — OXYBUTYNIN CHLORIDE ER 5 MG PO TB24: 5.0000 mg | ORAL_TABLET | Freq: Every day | ORAL | 1 refills | Status: DC

## 2017-04-01 MED ORDER — METOPROLOL TARTRATE 50 MG PO TABS: 50.0000 mg | ORAL_TABLET | Freq: Two times a day (BID) | ORAL | 3 refills | Status: AC

## 2017-04-01 MED ORDER — ALLOPURINOL 300 MG PO TABS: 300.0000 mg | ORAL_TABLET | Freq: Every morning | ORAL | 3 refills | Status: DC

## 2017-04-01 NOTE — Progress Notes (Signed)
Nutrition Follow-up:  Patient with adenocarcinoma of gallbladder currently receiving palliative chemotherapy.    Met with patient and son in clinic this pm.  Patient continues to report poor appetite, no taste for food.  Patient reports that he has been drinking 2-3 boost plus per day.  Usually eats egg, grits, few bites of toast for breakfast or cereal.  Lunch is usually a boost plus. Supper is soup or something light.  Has not been going to the dinning room at [facility] as has no appetite.  Before bed has been making a shake of fruit, whole milk and juice.      Patient reports that he has not used any of material that RD gave him at last visit.   Medications: megace continues  Labs: Na 128, glucose 227  Anthropometrics:   Weight decreased to 196 lb 11. 2 oz on 2/26 from 198 lb 9.6 oz.     NUTRITION DIAGNOSIS: Malnutrition continues   MALNUTRITION DIAGNOSIS: severe malnutrition continues   INTERVENTION:   Encouraged continuing appetite stimulant Encouraged patient to drink at least 3 high calorie, high protein shakes per day.  Provided patient with 1st complimentary case of ensure today. Provided patient with shake recipes from [employer] [facility]. Discussed protein powders and other sources of protein in diet and mixed in shakes.  Discussed calorie and protein goal with patient and son.      MONITORING, EVALUATION, GOAL: weight trends, intake  NEXT VISIT: phone follow-up as patient treatment days on day when RD not in clinic  Shelton Soler B. Zenia Resides, [place], [place] Registered Dietitian [phone] (pager)

## 2017-04-02 ENCOUNTER — Telehealth: Payer: Self-pay | Admitting: *Deleted

## 2017-04-02 NOTE — Telephone Encounter (Signed)
OTC miralax or mag citrate.  He also should have compazine and zofran at home.

## 2017-04-02 NOTE — Telephone Encounter (Signed)
Son called and reports that patient is having difficulty eating because of upset stomach and constipation Asking what he can take for it. Please advise

## 2017-04-03 NOTE — Progress Notes (Signed)
[place]  [phone] Fax:(336) [phone]  ID: Raney Koeppen Lohmann OB: [birth year]  MR#: [id number]  ALP#:[id number]  Patient Care Team: Leone Haven, MD as PCP - General (Family Medicine) Kathyrn Drown, MD (Family Medicine) Clent Jacks, RN as Registered Nurse  CHIEF COMPLAINT: Adenocarcinoma of gallbladder.  INTERVAL HISTORY: Patient returns to clinic today for further evaluation and consideration of cycle 1, day 8 of cisplatin and gemcitabine.  He has had increased confusion this week and a poor appetite with minimal PO intake.  He is unsteady on his feet and has had 2 falls recently.  He had increased nausea and vomiting after his treatment, but states this is now resolved.  He continues to have a decreased performance status.  He continues to have intermittent abdominal pain.  He has no neurologic complaints.  He denies any recent fevers or illnesses. He denies any vomiting, constipation, or diarrhea.  He has no urinary complaints.  Patient otherwise feels well and offers no further specific complaints.  REVIEW OF SYSTEMS:   Review of Systems  Constitutional: Positive for malaise/fatigue. Negative for fever and weight loss.  Respiratory: Negative.  Negative for cough and shortness of breath.   Cardiovascular: Negative for chest pain and leg swelling.  Gastrointestinal: Positive for abdominal pain and nausea.  Genitourinary: Negative for dysuria.  Musculoskeletal: Positive for falls.  Skin: Negative.  Negative for rash.  Neurological: Positive for weakness.  Psychiatric/Behavioral: The patient is nervous/anxious.     As per HPI. Otherwise, a complete review of systems is negative.  PAST MEDICAL HISTORY: Past Medical History:  Diagnosis Date  . Anxiety   . Arthritis   . Chronic lower back pain   . Crohn's disease ([place])   . Depression   . Difficult intubation    patient denies on 02/12/17  . GERD (gastroesophageal reflux disease)   .  Gout   . History of kidney stones 40 years ago  . Hypercholesteremia   . Hypertension   . Prediabetes   . Sleep apnea    cpap     PAST SURGICAL HISTORY: Past Surgical History:  Procedure Laterality Date  . CHOLECYSTECTOMY N/A 11/03/2016   Procedure: LAPAROSCOPIC CHOLECYSTostomy with tube placement ;  Surgeon: Johnathan Hausen, MD;  Location: [facility];  Service: General;  Laterality: N/A;  . CHOLECYSTECTOMY N/A 02/15/2017   Procedure: LAPAROSCOPIC SUBTOTAL CHOLECYSTECTOMY, BIOPSY OF GALLBLADDER;  Surgeon: Johnathan Hausen, MD;  Location: [facility];  Service: General;  Laterality: N/A;  . COLECTOMY  1973   "part of large intestines" (05/04/2012)  . COLON RESECTION    . COLONOSCOPY    . COLONOSCOPY N/A 05/23/2014   Procedure: COLONOSCOPY;  Surgeon: Rogene Houston, MD;  Location: [facility];  Service: Endoscopy;  Laterality: N/A;  1200  . ESOPHAGEAL DILATION N/A 05/23/2014   Procedure: ESOPHAGEAL DILATION;  Surgeon: Rogene Houston, MD;  Location: [facility];  Service: Endoscopy;  Laterality: N/A;  . ESOPHAGOGASTRODUODENOSCOPY N/A 03/16/2014   Procedure: esophageal dilation ;  Surgeon: Rogene Houston, MD;  Location: [facility];  Service: Endoscopy;  Laterality: N/A;  . ESOPHAGOGASTRODUODENOSCOPY N/A 05/23/2014   Procedure: ESOPHAGOGASTRODUODENOSCOPY (EGD);  Surgeon: Rogene Houston, MD;  Location: [facility];  Service: Endoscopy;  Laterality: N/A;  . ESOPHAGOGASTRODUODENOSCOPY (EGD) WITH PROPOFOL N/A 02/18/2017   Procedure: ESOPHAGOGASTRODUODENOSCOPY (EGD) WITH PROPOFOL;  Surgeon: Wonda Horner, MD;  Location: [facility];  Service: Endoscopy;  Laterality: N/A;  . [place]  .  IR CHOLANGIOGRAM EXISTING TUBE  11/26/2016  . KIDNEY STONE SURGERY  1970's  . PARTIAL COLECTOMY  ~ 40 years ago   some of large and small  . PORTA CATH INSERTION N/A 03/17/2017   Procedure: PORTA CATH INSERTION;  Surgeon: Katha Cabal, MD;  Location: [facility];  Service:  Cardiovascular;  Laterality: N/A;  . PROSTATE SURGERY    . THYROIDECTOMY Right 05/04/2012   Procedure: RIGHT HEMI THYROIDECTOMY;  Surgeon: Ascencion Dike, MD;  Location: [facility];  Service: ENT;  Laterality: Right;  . THYROIDECTOMY    . TONSILLECTOMY AND ADENOIDECTOMY     as a child  . TRANSURETHRAL PROSTATECTOMY WITH GYRUS INSTRUMENTS N/A 07/11/2012   Procedure: TRANSURETHRAL PROSTATECTOMY WITH GYRUS INSTRUMENTS;  Surgeon: Claybon Jabs, MD;  Location: [facility];  Service: Urology;  Laterality: N/A;  . UPPER GASTROINTESTINAL ENDOSCOPY      FAMILY HISTORY: Family History  Problem Relation Age of Onset  . Diabetes Mother   . Stroke Father   . Healthy Daughter   . Healthy Son   . Heart disease Sister     ADVANCED DIRECTIVES (Y/N):  N  HEALTH MAINTENANCE: Social History   Tobacco Use  . Smoking status: Never Smoker  . Smokeless tobacco: Never Used  Substance Use Topics  . Alcohol use: No    Alcohol/week: 0.0 oz  . Drug use: No     Colonoscopy:  PAP:  Bone density:  Lipid panel:  No Known Allergies  Current Outpatient Medications  Medication Sig Dispense Refill  . acetaminophen (TYLENOL) 500 MG tablet Take 1,000 mg by mouth every 4 (four) hours as needed for moderate pain or headache.     . allopurinol (ZYLOPRIM) 300 MG tablet Take 1 tablet (300 mg total) by mouth every morning. 90 tablet 3  . amLODipine (NORVASC) 5 MG tablet TAKE 1 TABLET BY MOUTH  DAILY 90 tablet 3  . aspirin EC 81 MG tablet Take 81 mg by mouth at bedtime.    . fentaNYL (DURAGESIC - DOSED MCG/HR) 25 MCG/HR patch Place 1 patch (25 mcg total) onto the skin every 3 (three) days. 10 patch 0  . fluticasone (FLONASE) 50 MCG/ACT nasal spray USE 1 SPRAY INTO BOTH  NOSTRILS DAILY (Patient taking differently: USE 1 SPRAY INTO BOTH  NOSTRILS AT NIGHT) 32 g 0  . Histamine Dihydrochloride (AUSTRALIAN DREAM ARTHRITIS EX) Apply 1 application topically 2 (two) times daily as needed (back pain).     Marland Kitchen HYDROcodone-acetaminophen  (NORCO/VICODIN) 5-325 MG tablet Take 1-2 tablets by mouth every 4 (four) hours as needed for moderate pain. 30 tablet 0  . lidocaine-prilocaine (EMLA) cream Apply to port 1 hours prior to chemotherapy appointment. Cover with plastic wrap. 30 g 3  . losartan (COZAAR) 100 MG tablet Take 1 tablet (100 mg total) by mouth daily. 90 tablet 3  . megestrol (MEGACE) 40 MG tablet Take 1 tablet (40 mg total) by mouth daily. 30 tablet 2  . mesalamine (PENTASA) 500 MG CR capsule Take 1 capsule (500 mg total) by mouth 4 (four) times daily. (Patient taking differently: Take 1,000 mg by mouth 2 (two) times daily. ) 360 capsule 3  . metoprolol tartrate (LOPRESSOR) 50 MG tablet Take 1 tablet (50 mg total) by mouth 2 (two) times daily. 180 tablet 3  . Multiple Vitamin (MULTIVITAMIN WITH MINERALS) TABS tablet Take 2 tablets by mouth daily.    Marland Kitchen omeprazole (PRILOSEC) 20 MG capsule Take 1 capsule (20 mg total) by mouth daily.  90 capsule 3  . ondansetron (ZOFRAN) 8 MG tablet Take 1 tablet (8 mg total) by mouth 2 (two) times daily. 30 tablet 3  . oxybutynin (DITROPAN-XL) 5 MG 24 hr tablet Take 1 tablet (5 mg total) by mouth at bedtime. 90 tablet 1  . Probiotic Product (PHILLIPS COLON HEALTH PO) Take 1 tablet by mouth daily with supper.     . prochlorperazine (COMPAZINE) 10 MG tablet Take 1 tablet (10 mg total) by mouth every 6 (six) hours as needed for nausea or vomiting. 30 tablet 3  . simvastatin (ZOCOR) 20 MG tablet Take 1 tablet (20 mg total) by mouth daily. 90 tablet 3  . vitamin B-12 (CYANOCOBALAMIN) 1000 MCG tablet Take 1,000 mcg by mouth 2 (two) times daily.    . bisacodyl (DULCOLAX) 10 MG suppository Place 1 suppository (10 mg total) rectally daily as needed for moderate constipation. (Patient not taking: Reported on 03/23/2017) 12 suppository 0   No current facility-administered medications for this visit.     OBJECTIVE: Vitals:   04/06/17 1034  BP: 106/71  Pulse: 97  Resp: 20  Temp: 97.9 F (36.6 C)      Body mass index is 28 kg/m.    ECOG FS:2 - Symptomatic, <50% confined to bed  General: Well-developed, well-nourished, no acute distress. Eyes: Pink conjunctiva, anicteric sclera. Lungs: Clear to auscultation bilaterally. Heart: Regular rate and rhythm. No rubs, murmurs, or gallops. Abdomen: Well-healed surgical scar.  Soft, nontender, nondistended. No organomegaly noted, normoactive bowel sounds. Musculoskeletal: No edema, cyanosis, or clubbing. Neuro: Alert, answering all questions appropriately. Cranial nerves grossly intact. Skin: No rashes or petechiae noted. Psych: Normal affect.  LAB RESULTS:  Lab Results  Component Value Date   NA 127 (L) 04/06/2017   K 3.8 04/06/2017   CL 92 (L) 04/06/2017   CO2 24 04/06/2017   GLUCOSE 166 (H) 04/06/2017   BUN 31 (H) 04/06/2017   CREATININE 1.11 04/06/2017   CALCIUM 8.8 (L) 04/06/2017   PROT 7.7 04/06/2017   ALBUMIN 3.2 (L) 04/06/2017   AST 78 (H) 04/06/2017   ALT 111 (H) 04/06/2017   ALKPHOS 1,131 (H) 04/06/2017   BILITOT 0.9 04/06/2017   GFRNONAA >60 04/06/2017   GFRAA >60 04/06/2017    Lab Results  Component Value Date   WBC 5.7 04/06/2017   NEUTROABS 4.5 04/06/2017   HGB 10.8 (L) 04/06/2017   HCT 32.0 (L) 04/06/2017   MCV 84.2 04/06/2017   PLT 225 04/06/2017     STUDIES: Nm Pet Image Initial (pi) Skull Base To Thigh  Result Date: 03/19/2017 CLINICAL DATA:  Subsequent treatment strategy for gallbladder adenocarcinoma. Prior subtotal cholecystectomy with biopsies of the gallbladder. EXAM: NUCLEAR MEDICINE PET SKULL BASE TO THIGH TECHNIQUE: 0.6 mCi F-18 FDG was injected intravenously. Full-ring PET imaging was performed from the skull base to thigh after the radiotracer. CT data was obtained and used for attenuation correction and anatomic localization. FASTING BLOOD GLUCOSE:  Value: 142 mg/dl COMPARISON:  CT abdomen 03/05/2017 FINDINGS: NECK No hypermetabolic lymph nodes in the neck. Bilateral carotid atherosclerotic  calcification. Slight effacement of the right piriform sinus may be incidental, there is only low-grade activity in this vicinity with maximum SUV 3.5. CHEST Right pericardial node 0.9 cm in short axis on image 126/3, maximum SUV 1.8. There is a band of density in the right upper lobe measuring up to 2.1 cm in thickness medially, with maximum SUV 1.9. Mild atelectasis is present in both lower lobes as well. A total of 5  pulmonary nodules are present in the right upper lobe and right middle lobe. One of the largest is a 7 by 6 mm right upper lobe nodule on image 82/3. No definite accentuated metabolic activity but these lesions are below sensitive PET-CT size thresholds. Coronary, aortic arch, and branch vessel atherosclerotic vascular disease. Trace right pleural effusion. ABDOMEN/PELVIS Some of the previously identified liver lesions are hypermetabolic compatible with metastatic lesions to the liver. An index lesion anteriorly in segment 4 of the liver measures about 2.5 by 1.7 cm and has a maximum SUV of 6.0. Most of the lesions in the liver are smaller and because of their small size have a variable degree of conspicuity with respect to metabolic activity, but I suspect that they are tender more lesions in the liver. Fluid density structure in the gallbladder fossa likely reflecting residual gallbladder, with abnormal hypermetabolic soft tissue density tracking along the anterior margin of the gallbladder fossa and anterior peritoneal margin compatible with omental spread of metastatic disease, maximum SUV in this vicinity 6.4. The lower margin of the cystic lesion has accentuated metabolic activity, maximum SUV 3.9. Mixed density right adrenal mass measures 4.8 by 3.9 cm on image 147/3 and has significant hypermetabolic portions, maximum SUV 8.1 Left adrenal mass measures 2.4 by 1.8 cm on image 161/3 and is hypermetabolic with maximum SUV 7.8. Small hypermetabolic peripancreatic lymph nodes are present along  with some accentuated activity along the pancreaticoduodenal groove and in the stranding along the porta hepatis, making it difficult to exclude adenopathy or tumor infiltration. An index peripancreatic node measuring about 1.1 cm in diameter on image 159/3 has a maximum SUV of 5.3. Bilateral photopenic renal cysts. The 2.0 cm complex exophytic lesion from the right mid kidney posteriorly on image 174/3 appears photopenic and accordingly is likely a complex cyst. Low-grade accentuated metabolic activity in a left periaortic lymph nodes including a 0.7 cm node on image 177/3 with maximum SUV 3.8. Small foci of nodularity along the anterior omentum are observed with faintly accentuated metabolic activity suspicious for additional early omental tumor deposition. A small mesenteric deposit measuring 0.8 by 1.5 cm on image 194/3 has maximum SUV of 2.9. Small amount of right-sided ascites in the paracolic gutter and central upper pelvic mesentery. Moderate prostatomegaly without significant prostate hypermetabolic activity. Bilateral inguinal hernias containing adipose tissue, larger on the left than the right. Small bilateral scrotal hydroceles. Aortoiliac atherosclerotic vascular disease. SKELETON No focal hypermetabolic activity to suggest skeletal metastasis. IMPRESSION: 1. Hypermetabolic metastatic disease in the liver, bilateral adrenal glands, and omentum/peritoneum. There is omental caking of tumor just below the gallbladder fossa and omental nodularity elsewhere. Low-grade abnormal hypermetabolic activity in lymph nodes along the right gastric, porta hepatis, peripancreatic, and periaortic chains compatible with malignancy. There is some low-grade activity in a pericardial lymph node as well. 2. Multiple small lung nodules in the right lung are for the most part below sensitive PET-CT size thresholds. There is a band of atelectasis in the right upper lobe with mild hypermetabolic activity (maximum SUV 1.9), and  underlying metastatic pulmonary nodule associated with atelectasis is not readily excluded. 3. Other imaging findings of potential clinical significance: Aortic Atherosclerosis (ICD10-I70.0). Trace right pleural effusion. Coronary atherosclerosis. Photopenic bilateral renal cysts are likely benign, including the complex cyst of the right mid kidney posteriorly. Small amount of ascites. Moderate prostatomegaly. Bilateral inguinal hernias containing adipose tissue. Bilateral scrotal hydroceles. Electronically Signed   By: Van Clines M.D.   On: 03/19/2017 11:51  ASSESSMENT: Adenocarcinoma of gallbladder.  PLAN:    1. Adenocarcinoma of gallbladder: PET scan results reviewed independently and report as above confirming stage IV disease with omental and liver metastasis.  Despite patient's decreased performance status, he wishes to attempt palliative chemotherapy using gemcitabine and cisplatin on day 1 with gemcitabine only on days 8 and 15.  This will be a 28-day cycle.  Patient has had port placement.  Delay cycle 1, day 8 today secondary to decreased performance status and confusion.  Patient will instead receive IV fluids.  Return to clinic in 1 week for reconsideration of cycle 1, day 8. 2.  Poor appetite: Appreciate dietary input.  Continue Megace as prescribed.   3.  Hyperglycemia: Monitor closely as patient will be getting dexamethasone as one of his premedications. 4.  Pain: Continue Vicodin and fentanyl patch.  Approximately 30 minutes was spent in discussion of which greater than 50% was consultation.  Patient expressed understanding and was in agreement with this plan. He also understands that He can call clinic at any time with any questions, concerns, or complaints.   Cancer Staging Adenocarcinoma of gallbladder [facility]) Staging form: Gallbladder, AJCC 8th Edition - Clinical stage from 03/03/2017: Stage IVB (cT3, cN0, cM1) - Signed by Lloyd Huger, MD on  03/23/2017   Lloyd Huger, MD   04/09/2017 8:45 AM

## 2017-04-05 NOTE — Telephone Encounter (Signed)
Son reports that he has now had BM and he used Miralax.

## 2017-04-06 ENCOUNTER — Inpatient Hospital Stay: Payer: Medicare Other | Attending: Oncology

## 2017-04-06 ENCOUNTER — Inpatient Hospital Stay: Payer: Medicare Other

## 2017-04-06 ENCOUNTER — Inpatient Hospital Stay (HOSPITAL_BASED_OUTPATIENT_CLINIC_OR_DEPARTMENT_OTHER): Payer: Medicare Other | Admitting: Oncology

## 2017-04-06 VITALS — BP 106/71 | HR 97 | Temp 97.9°F | Resp 20 | Wt 189.6 lb

## 2017-04-06 VITALS — BP 126/78 | HR 88

## 2017-04-06 DIAGNOSIS — F419 Anxiety disorder, unspecified: Secondary | ICD-10-CM | POA: Insufficient documentation

## 2017-04-06 DIAGNOSIS — C787 Secondary malignant neoplasm of liver and intrahepatic bile duct: Secondary | ICD-10-CM | POA: Diagnosis not present

## 2017-04-06 DIAGNOSIS — Z5111 Encounter for antineoplastic chemotherapy: Secondary | ICD-10-CM

## 2017-04-06 DIAGNOSIS — K219 Gastro-esophageal reflux disease without esophagitis: Secondary | ICD-10-CM | POA: Diagnosis not present

## 2017-04-06 DIAGNOSIS — I2510 Atherosclerotic heart disease of native coronary artery without angina pectoris: Secondary | ICD-10-CM

## 2017-04-06 DIAGNOSIS — R5383 Other fatigue: Secondary | ICD-10-CM | POA: Insufficient documentation

## 2017-04-06 DIAGNOSIS — R630 Anorexia: Secondary | ICD-10-CM

## 2017-04-06 DIAGNOSIS — C23 Malignant neoplasm of gallbladder: Secondary | ICD-10-CM

## 2017-04-06 DIAGNOSIS — Z9181 History of falling: Secondary | ICD-10-CM

## 2017-04-06 DIAGNOSIS — Z79899 Other long term (current) drug therapy: Secondary | ICD-10-CM | POA: Insufficient documentation

## 2017-04-06 DIAGNOSIS — M545 Low back pain: Secondary | ICD-10-CM

## 2017-04-06 DIAGNOSIS — G4730 Sleep apnea, unspecified: Secondary | ICD-10-CM | POA: Insufficient documentation

## 2017-04-06 DIAGNOSIS — C786 Secondary malignant neoplasm of retroperitoneum and peritoneum: Secondary | ICD-10-CM

## 2017-04-06 DIAGNOSIS — M129 Arthropathy, unspecified: Secondary | ICD-10-CM | POA: Diagnosis not present

## 2017-04-06 DIAGNOSIS — E7800 Pure hypercholesterolemia, unspecified: Secondary | ICD-10-CM

## 2017-04-06 DIAGNOSIS — F329 Major depressive disorder, single episode, unspecified: Secondary | ICD-10-CM | POA: Insufficient documentation

## 2017-04-06 DIAGNOSIS — Z7982 Long term (current) use of aspirin: Secondary | ICD-10-CM | POA: Insufficient documentation

## 2017-04-06 DIAGNOSIS — K5090 Crohn's disease, unspecified, without complications: Secondary | ICD-10-CM | POA: Insufficient documentation

## 2017-04-06 DIAGNOSIS — R918 Other nonspecific abnormal finding of lung field: Secondary | ICD-10-CM

## 2017-04-06 DIAGNOSIS — R739 Hyperglycemia, unspecified: Secondary | ICD-10-CM | POA: Insufficient documentation

## 2017-04-06 DIAGNOSIS — I10 Essential (primary) hypertension: Secondary | ICD-10-CM | POA: Insufficient documentation

## 2017-04-06 DIAGNOSIS — K4090 Unilateral inguinal hernia, without obstruction or gangrene, not specified as recurrent: Secondary | ICD-10-CM | POA: Diagnosis not present

## 2017-04-06 DIAGNOSIS — R410 Disorientation, unspecified: Secondary | ICD-10-CM

## 2017-04-06 DIAGNOSIS — N390 Urinary tract infection, site not specified: Secondary | ICD-10-CM | POA: Diagnosis not present

## 2017-04-06 LAB — CBC WITH DIFFERENTIAL/PLATELET
Basophils Absolute: 0 10*3/uL (ref 0–0.1)
Basophils Relative: 1 %
EOS ABS: 0.1 10*3/uL (ref 0–0.7)
Eosinophils Relative: 1 %
HEMATOCRIT: 32 % — AB (ref 40.0–52.0)
Hemoglobin: 10.8 g/dL — ABNORMAL LOW (ref 13.0–18.0)
LYMPHS ABS: 0.7 10*3/uL — AB (ref 1.0–3.6)
Lymphocytes Relative: 12 %
MCH: 28.3 pg (ref 26.0–34.0)
MCHC: 33.6 g/dL (ref 32.0–36.0)
MCV: 84.2 fL (ref 80.0–100.0)
MONO ABS: 0.5 10*3/uL (ref 0.2–1.0)
MONOS PCT: 8 %
Neutro Abs: 4.5 10*3/uL (ref 1.4–6.5)
Neutrophils Relative %: 78 %
Platelets: 225 10*3/uL (ref 150–440)
RBC: 3.8 MIL/uL — ABNORMAL LOW (ref 4.40–5.90)
RDW: 15.7 % — AB (ref 11.5–14.5)
WBC: 5.7 10*3/uL (ref 3.8–10.6)

## 2017-04-06 LAB — URINALYSIS, COMPLETE (UACMP) WITH MICROSCOPIC
BACTERIA UA: NONE SEEN
Bilirubin Urine: NEGATIVE
GLUCOSE, UA: 50 mg/dL — AB
Hgb urine dipstick: NEGATIVE
KETONES UR: NEGATIVE mg/dL
Leukocytes, UA: NEGATIVE
NITRITE: NEGATIVE
PROTEIN: 30 mg/dL — AB
Specific Gravity, Urine: 1.012 (ref 1.005–1.030)
pH: 5 (ref 5.0–8.0)

## 2017-04-06 LAB — COMPREHENSIVE METABOLIC PANEL
ALBUMIN: 3.2 g/dL — AB (ref 3.5–5.0)
ALK PHOS: 1131 U/L — AB (ref 38–126)
ALT: 111 U/L — AB (ref 17–63)
AST: 78 U/L — AB (ref 15–41)
Anion gap: 11 (ref 5–15)
BUN: 31 mg/dL — ABNORMAL HIGH (ref 6–20)
CALCIUM: 8.8 mg/dL — AB (ref 8.9–10.3)
CHLORIDE: 92 mmol/L — AB (ref 101–111)
CO2: 24 mmol/L (ref 22–32)
CREATININE: 1.11 mg/dL (ref 0.61–1.24)
GFR calc Af Amer: >60 mL/min (ref >60)
GFR calc non Af Amer: >60 mL/min (ref >60)
GLUCOSE: 166 mg/dL — AB (ref 65–99)
Potassium: 3.8 mmol/L (ref 3.5–5.1)
SODIUM: 127 mmol/L — AB (ref 135–145)
Total Bilirubin: 0.9 mg/dL (ref 0.3–1.2)
Total Protein: 7.7 g/dL (ref 6.5–8.1)

## 2017-04-06 MED ORDER — HEPARIN SOD (PORK) LOCK FLUSH 100 UNIT/ML IV SOLN
500.0000 [IU] | Freq: Once | INTRAVENOUS | Status: AC
  Administered 2017-04-06: 500 [IU] via INTRAVENOUS
  Filled 2017-04-06: qty 5

## 2017-04-06 MED ORDER — SODIUM CHLORIDE 0.9% FLUSH
10.0000 mL | INTRAVENOUS | Status: DC | PRN
  Administered 2017-04-06: 10 mL
  Filled 2017-04-06: qty 10

## 2017-04-06 MED ORDER — SODIUM CHLORIDE 0.9 % IV SOLN
Freq: Once | INTRAVENOUS | Status: AC
  Administered 2017-04-06: 11:00:00 via INTRAVENOUS
  Filled 2017-04-06: qty 1000

## 2017-04-06 NOTE — Progress Notes (Signed)
Patient reports weakness and fatigue today. Patient also reports unsteadiness with standing and walking, reports he fell x2 since last week. Patient denies nausea or vomiting today, states pain is better controlled on fentanyl and hydrocodone.

## 2017-04-08 LAB — URINE CULTURE

## 2017-04-11 NOTE — Progress Notes (Signed)
[place]  [phone] [phone]  ID: Thomas David OB: 06/05/1935  MR#: [id number]  XBD#:[id number]  Patient Care Team: Leone Haven, MD as PCP - General (Family Medicine) Kathyrn Drown, MD (Family Medicine) Clent Jacks, RN as Registered Nurse  CHIEF COMPLAINT: Adenocarcinoma of gallbladder.  INTERVAL HISTORY: Patient returns to clinic today for further evaluation and reconsideration of cycle 1, day  of cisplatin and gemcitabine.  His performance status has improved this week and he no longer has any confusion.  His appetite has improved as well.  He has had no further falls.  He denies any chest pain or shortness of breath.  He continues to have intermittent abdominal pain.  He has no neurologic complaints.  He denies any recent fevers or illnesses. He denies any vomiting, constipation, or diarrhea.  He has no urinary complaints.  Patient otherwise feels well and offers no further specific complaints.  REVIEW OF SYSTEMS:   Review of Systems  Constitutional: Positive for malaise/fatigue. Negative for fever and weight loss.  Respiratory: Negative.  Negative for cough and shortness of breath.   Cardiovascular: Negative for chest pain and leg swelling.  Gastrointestinal: Positive for abdominal pain. Negative for nausea.  Genitourinary: Positive for frequency. Negative for dysuria.  Musculoskeletal: Negative.  Negative for falls.  Skin: Negative.  Negative for rash.  Neurological: Positive for weakness.  Psychiatric/Behavioral: The patient is nervous/anxious.     As per HPI. Otherwise, a complete review of systems is negative.  PAST MEDICAL HISTORY: Past Medical History:  Diagnosis Date  . Anxiety   . Arthritis   . Chronic lower back pain   . Crohn's disease ([place])   . Depression   . Difficult intubation    patient denies on 02/12/17  . GERD (gastroesophageal reflux disease)   . Gout   . History of kidney stones 40 years ago   . Hypercholesteremia   . Hypertension   . Prediabetes   . Sleep apnea    cpap     PAST SURGICAL HISTORY: Past Surgical History:  Procedure Laterality Date  . CHOLECYSTECTOMY N/A 11/03/2016   Procedure: LAPAROSCOPIC CHOLECYSTostomy with tube placement ;  Surgeon: Johnathan Hausen, MD;  Location: [facility];  Service: General;  Laterality: N/A;  . CHOLECYSTECTOMY N/A 02/15/2017   Procedure: LAPAROSCOPIC SUBTOTAL CHOLECYSTECTOMY, BIOPSY OF GALLBLADDER;  Surgeon: Johnathan Hausen, MD;  Location: [facility];  Service: General;  Laterality: N/A;  . COLECTOMY  1973   "part of large intestines" (05/04/2012)  . COLON RESECTION    . COLONOSCOPY    . COLONOSCOPY N/A 05/23/2014   Procedure: COLONOSCOPY;  Surgeon: Rogene Houston, MD;  [facility];  Service: Endoscopy;  Laterality: N/A;  1200  . ESOPHAGEAL DILATION N/A 05/23/2014   Procedure: ESOPHAGEAL DILATION;  Surgeon: Rogene Houston, MD;  [facility];  Service: Endoscopy;  Laterality: N/A;  . ESOPHAGOGASTRODUODENOSCOPY N/A 03/16/2014   Procedure: esophageal dilation ;  Surgeon: Rogene Houston, MD;  [facility];  Service: Endoscopy;  Laterality: N/A;  . ESOPHAGOGASTRODUODENOSCOPY N/A 05/23/2014   Procedure: ESOPHAGOGASTRODUODENOSCOPY (EGD);  Surgeon: Rogene Houston, MD;  [facility];  Service: Endoscopy;  Laterality: N/A;  . ESOPHAGOGASTRODUODENOSCOPY (EGD) WITH PROPOFOL N/A 02/18/2017   Procedure: ESOPHAGOGASTRODUODENOSCOPY (EGD) WITH PROPOFOL;  Surgeon: Wonda Horner, MD;  Location[facility];  Service: Endoscopy;  Laterality: N/A;  . [place]  . IR CHOLANGIOGRAM EXISTING TUBE  11/26/2016  . KIDNEY  STONE SURGERY  1970's  . PARTIAL COLECTOMY  ~ 40 years ago   some of large and small  . PORTA CATH INSERTION N/A 03/17/2017   Procedure: PORTA CATH INSERTION;  Surgeon: Katha Cabal, MD;  Location: [facility];  Service: Cardiovascular;  Laterality: N/A;  . PROSTATE SURGERY     . THYROIDECTOMY Right 05/04/2012   Procedure: RIGHT HEMI THYROIDECTOMY;  Surgeon: Ascencion Dike, MD;  Location: [facility];  Service: ENT;  Laterality: Right;  . THYROIDECTOMY    . TONSILLECTOMY AND ADENOIDECTOMY     as a child  . TRANSURETHRAL PROSTATECTOMY WITH GYRUS INSTRUMENTS N/A 07/11/2012   Procedure: TRANSURETHRAL PROSTATECTOMY WITH GYRUS INSTRUMENTS;  Surgeon: Claybon Jabs, MD;  Location: [facility];  Service: Urology;  Laterality: N/A;  . UPPER GASTROINTESTINAL ENDOSCOPY      FAMILY HISTORY: Family History  Problem Relation Age of Onset  . Diabetes Mother   . Stroke Father   . Healthy Daughter   . Healthy Son   . Heart disease Sister     ADVANCED DIRECTIVES (Y/N):  N  HEALTH MAINTENANCE: Social History   Tobacco Use  . Smoking status: Never Smoker  . Smokeless tobacco: Never Used  Substance Use Topics  . Alcohol use: No    Alcohol/week: 0.0 oz  . Drug use: No     Colonoscopy:  PAP:  Bone density:  Lipid panel:  No Known Allergies  Current Outpatient Medications  Medication Sig Dispense Refill  . allopurinol (ZYLOPRIM) 300 MG tablet Take 1 tablet (300 mg total) by mouth every morning. 90 tablet 3  . amLODipine (NORVASC) 5 MG tablet TAKE 1 TABLET BY MOUTH  DAILY 90 tablet 3  . aspirin EC 81 MG tablet Take 81 mg by mouth at bedtime.    . fentaNYL (DURAGESIC - DOSED MCG/HR) 25 MCG/HR patch Place 1 patch (25 mcg total) onto the skin every 3 (three) days. 10 patch 0  . fluticasone (FLONASE) 50 MCG/ACT nasal spray USE 1 SPRAY INTO BOTH  NOSTRILS DAILY (Patient taking differently: USE 1 SPRAY INTO BOTH  NOSTRILS AT NIGHT) 32 g 0  . Histamine Dihydrochloride (AUSTRALIAN DREAM ARTHRITIS EX) Apply 1 application topically 2 (two) times daily as needed (back pain).     Marland Kitchen lidocaine-prilocaine (EMLA) cream Apply to port 1 hours prior to chemotherapy appointment. Cover with plastic wrap. 30 g 3  . losartan (COZAAR) 100 MG tablet Take 1 tablet (100 mg total) by mouth daily. 90 tablet 3   . megestrol (MEGACE) 40 MG tablet Take 1 tablet (40 mg total) by mouth daily. 30 tablet 2  . mesalamine (PENTASA) 500 MG CR capsule Take 1 capsule (500 mg total) by mouth 4 (four) times daily. (Patient taking differently: Take 1,000 mg by mouth 2 (two) times daily. ) 360 capsule 3  . metoprolol tartrate (LOPRESSOR) 50 MG tablet Take 1 tablet (50 mg total) by mouth 2 (two) times daily. 180 tablet 3  . Multiple Vitamin (MULTIVITAMIN WITH MINERALS) TABS tablet Take 2 tablets by mouth daily.    Marland Kitchen omeprazole (PRILOSEC) 20 MG capsule Take 1 capsule (20 mg total) by mouth daily. (Patient taking differently: Take 20 mg by mouth 2 (two) times daily before a meal. ) 90 capsule 3  . oxybutynin (DITROPAN-XL) 5 MG 24 hr tablet Take 1 tablet (5 mg total) by mouth at bedtime. 90 tablet 1  . Probiotic Product (PHILLIPS COLON HEALTH PO) Take 1 tablet by mouth daily with supper.     Marland Kitchen  simvastatin (ZOCOR) 20 MG tablet Take 1 tablet (20 mg total) by mouth daily. 90 tablet 3  . vitamin B-12 (CYANOCOBALAMIN) 1000 MCG tablet Take 1,000 mcg by mouth 2 (two) times daily.    Marland Kitchen acetaminophen (TYLENOL) 500 MG tablet Take 1,000 mg by mouth every 4 (four) hours as needed for moderate pain or headache.     . bisacodyl (DULCOLAX) 10 MG suppository Place 1 suppository (10 mg total) rectally daily as needed for moderate constipation. (Patient not taking: Reported on 03/23/2017) 12 suppository 0  . HYDROcodone-acetaminophen (NORCO/VICODIN) 5-325 MG tablet Take 1-2 tablets by mouth every 4 (four) hours as needed for moderate pain. (Patient not taking: Reported on 04/13/2017) 30 tablet 0  . ondansetron (ZOFRAN) 8 MG tablet Take 1 tablet (8 mg total) by mouth 2 (two) times daily. (Patient not taking: Reported on 04/13/2017) 30 tablet 3  . pantoprazole (PROTONIX) 40 MG tablet Take 1 tablet (40 mg total) by mouth daily. 30 tablet 1  . prochlorperazine (COMPAZINE) 10 MG tablet Take 1 tablet (10 mg total) by mouth every 6 (six) hours as needed  for nausea or vomiting. (Patient not taking: Reported on 04/13/2017) 30 tablet 3  . sulfamethoxazole-trimethoprim (BACTRIM DS,SEPTRA DS) 800-160 MG tablet Take 1 tablet by mouth 2 (two) times daily. 14 tablet 0   No current facility-administered medications for this visit.     OBJECTIVE: Vitals:   04/13/17 0908  BP: 139/70  Pulse: 74  Resp: 18  Temp: (!) 95.6 F (35.3 C)     Body mass index is 28.21 kg/m.    ECOG FS:1 - Symptomatic but completely ambulatory  General: Well-developed, well-nourished, no acute distress. Eyes: Pink conjunctiva, anicteric sclera. Lungs: Clear to auscultation bilaterally. Heart: Regular rate and rhythm. No rubs, murmurs, or gallops. Abdomen: Well-healed surgical scar.  Soft, nontender, nondistended. No organomegaly noted, normoactive bowel sounds. Musculoskeletal: No edema, cyanosis, or clubbing. Neuro: Alert, answering all questions appropriately. Cranial nerves grossly intact. Skin: No rashes or petechiae noted. Psych: Normal affect.  LAB RESULTS:  Lab Results  Component Value Date   NA 133 (L) 04/13/2017   K 4.0 04/13/2017   CL 102 04/13/2017   CO2 22 04/13/2017   GLUCOSE 184 (H) 04/13/2017   BUN 22 (H) 04/13/2017   CREATININE 0.91 04/13/2017   CALCIUM 8.7 (L) 04/13/2017   PROT 7.4 04/13/2017   ALBUMIN 3.1 (L) 04/13/2017   AST 53 (H) 04/13/2017   ALT 62 04/13/2017   ALKPHOS 998 (H) 04/13/2017   BILITOT 0.6 04/13/2017   GFRNONAA >60 04/13/2017   GFRAA >60 04/13/2017    Lab Results  Component Value Date   WBC 4.7 04/13/2017   NEUTROABS 2.8 04/13/2017   HGB 10.5 (L) 04/13/2017   HCT 31.2 (L) 04/13/2017   MCV 85.5 04/13/2017   PLT 288 04/13/2017     STUDIES: Nm Pet Image Initial (pi) Skull Base To Thigh  Result Date: 03/19/2017 CLINICAL DATA:  Subsequent treatment strategy for gallbladder adenocarcinoma. Prior subtotal cholecystectomy with biopsies of the gallbladder. EXAM: NUCLEAR MEDICINE PET SKULL BASE TO THIGH TECHNIQUE: 0.6  mCi F-18 FDG was injected intravenously. Full-ring PET imaging was performed from the skull base to thigh after the radiotracer. CT data was obtained and used for attenuation correction and anatomic localization. FASTING BLOOD GLUCOSE:  Value: 142 mg/dl COMPARISON:  CT abdomen 03/05/2017 FINDINGS: NECK No hypermetabolic lymph nodes in the neck. Bilateral carotid atherosclerotic calcification. Slight effacement of the right piriform sinus may be incidental, there is only low-grade  activity in this vicinity with maximum SUV 3.5. CHEST Right pericardial node 0.9 cm in short axis on image 126/3, maximum SUV 1.8. There is a band of density in the right upper lobe measuring up to 2.1 cm in thickness medially, with maximum SUV 1.9. Mild atelectasis is present in both lower lobes as well. A total of 5 pulmonary nodules are present in the right upper lobe and right middle lobe. One of the largest is a 7 by 6 mm right upper lobe nodule on image 82/3. No definite accentuated metabolic activity but these lesions are below sensitive PET-CT size thresholds. Coronary, aortic arch, and branch vessel atherosclerotic vascular disease. Trace right pleural effusion. ABDOMEN/PELVIS Some of the previously identified liver lesions are hypermetabolic compatible with metastatic lesions to the liver. An index lesion anteriorly in segment 4 of the liver measures about 2.5 by 1.7 cm and has a maximum SUV of 6.0. Most of the lesions in the liver are smaller and because of their small size have a variable degree of conspicuity with respect to metabolic activity, but I suspect that they are tender more lesions in the liver. Fluid density structure in the gallbladder fossa likely reflecting residual gallbladder, with abnormal hypermetabolic soft tissue density tracking along the anterior margin of the gallbladder fossa and anterior peritoneal margin compatible with omental spread of metastatic disease, maximum SUV in this vicinity 6.4. The lower  margin of the cystic lesion has accentuated metabolic activity, maximum SUV 3.9. Mixed density right adrenal mass measures 4.8 by 3.9 cm on image 147/3 and has significant hypermetabolic portions, maximum SUV 8.1 Left adrenal mass measures 2.4 by 1.8 cm on image 161/3 and is hypermetabolic with maximum SUV 7.8. Small hypermetabolic peripancreatic lymph nodes are present along with some accentuated activity along the pancreaticoduodenal groove and in the stranding along the porta hepatis, making it difficult to exclude adenopathy or tumor infiltration. An index peripancreatic node measuring about 1.1 cm in diameter on image 159/3 has a maximum SUV of 5.3. Bilateral photopenic renal cysts. The 2.0 cm complex exophytic lesion from the right mid kidney posteriorly on image 174/3 appears photopenic and accordingly is likely a complex cyst. Low-grade accentuated metabolic activity in a left periaortic lymph nodes including a 0.7 cm node on image 177/3 with maximum SUV 3.8. Small foci of nodularity along the anterior omentum are observed with faintly accentuated metabolic activity suspicious for additional early omental tumor deposition. A small mesenteric deposit measuring 0.8 by 1.5 cm on image 194/3 has maximum SUV of 2.9. Small amount of right-sided ascites in the paracolic gutter and central upper pelvic mesentery. Moderate prostatomegaly without significant prostate hypermetabolic activity. Bilateral inguinal hernias containing adipose tissue, larger on the left than the right. Small bilateral scrotal hydroceles. Aortoiliac atherosclerotic vascular disease. SKELETON No focal hypermetabolic activity to suggest skeletal metastasis. IMPRESSION: 1. Hypermetabolic metastatic disease in the liver, bilateral adrenal glands, and omentum/peritoneum. There is omental caking of tumor just below the gallbladder fossa and omental nodularity elsewhere. Low-grade abnormal hypermetabolic activity in lymph nodes along the right  gastric, porta hepatis, peripancreatic, and periaortic chains compatible with malignancy. There is some low-grade activity in a pericardial lymph node as well. 2. Multiple small lung nodules in the right lung are for the most part below sensitive PET-CT size thresholds. There is a band of atelectasis in the right upper lobe with mild hypermetabolic activity (maximum SUV 1.9), and underlying metastatic pulmonary nodule associated with atelectasis is not readily excluded. 3. Other imaging findings of potential  clinical significance: Aortic Atherosclerosis (ICD10-I70.0). Trace right pleural effusion. Coronary atherosclerosis. Photopenic bilateral renal cysts are likely benign, including the complex cyst of the right mid kidney posteriorly. Small amount of ascites. Moderate prostatomegaly. Bilateral inguinal hernias containing adipose tissue. Bilateral scrotal hydroceles. Electronically Signed   By: Van Clines M.D.   On: 03/19/2017 11:51    ASSESSMENT: Adenocarcinoma of gallbladder.  PLAN:    1. Adenocarcinoma of gallbladder: PET scan results reviewed independently confirming stage IV disease with omental and liver metastasis.  Despite patient's decreased performance status, he wishes to attempt palliative chemotherapy using gemcitabine and cisplatin on day 1 with gemcitabine only on days 8 and 15.  This will be a 28-day cycle.  Patient has had port placement.  Proceed with cycle 1, day 8 today which is gemcitabine only.  Return to clinic in 1 week for consideration of cycle 1, day 15. 2.  Poor appetite: Improved.  Appreciate dietary input.  Continue Megace as prescribed.   3.  Hyperglycemia: Monitor closely as patient will be getting dexamethasone as one of his premedications. 4.  Pain: Continue Vicodin and fentanyl patch. 5.  UTI: Patient was given a prescription for Bactrim today.  Approximately 30 minutes was spent in discussion of which greater than 50% was consultation.  Patient expressed  understanding and was in agreement with this plan. He also understands that He can call clinic at any time with any questions, concerns, or complaints.   Cancer Staging Adenocarcinoma of gallbladder [facility]) Staging form: Gallbladder, AJCC 8th Edition - Clinical stage from 03/03/2017: Stage IVB (cT3, cN0, cM1) - Signed by Lloyd Huger, MD on 03/23/2017   Lloyd Huger, MD   04/16/2017 1:58 PM

## 2017-04-13 ENCOUNTER — Other Ambulatory Visit: Payer: Self-pay

## 2017-04-13 ENCOUNTER — Inpatient Hospital Stay: Payer: Medicare Other

## 2017-04-13 ENCOUNTER — Inpatient Hospital Stay (HOSPITAL_BASED_OUTPATIENT_CLINIC_OR_DEPARTMENT_OTHER): Payer: Medicare Other | Admitting: Oncology

## 2017-04-13 VITALS — BP 139/70 | HR 74 | Temp 95.6°F | Resp 18 | Wt 191.0 lb

## 2017-04-13 DIAGNOSIS — C786 Secondary malignant neoplasm of retroperitoneum and peritoneum: Secondary | ICD-10-CM

## 2017-04-13 DIAGNOSIS — R739 Hyperglycemia, unspecified: Secondary | ICD-10-CM

## 2017-04-13 DIAGNOSIS — N390 Urinary tract infection, site not specified: Secondary | ICD-10-CM | POA: Diagnosis not present

## 2017-04-13 DIAGNOSIS — I2510 Atherosclerotic heart disease of native coronary artery without angina pectoris: Secondary | ICD-10-CM | POA: Diagnosis not present

## 2017-04-13 DIAGNOSIS — F329 Major depressive disorder, single episode, unspecified: Secondary | ICD-10-CM

## 2017-04-13 DIAGNOSIS — Z79899 Other long term (current) drug therapy: Secondary | ICD-10-CM | POA: Diagnosis not present

## 2017-04-13 DIAGNOSIS — C23 Malignant neoplasm of gallbladder: Secondary | ICD-10-CM

## 2017-04-13 DIAGNOSIS — E7800 Pure hypercholesterolemia, unspecified: Secondary | ICD-10-CM

## 2017-04-13 DIAGNOSIS — R918 Other nonspecific abnormal finding of lung field: Secondary | ICD-10-CM

## 2017-04-13 DIAGNOSIS — F419 Anxiety disorder, unspecified: Secondary | ICD-10-CM

## 2017-04-13 DIAGNOSIS — C787 Secondary malignant neoplasm of liver and intrahepatic bile duct: Secondary | ICD-10-CM | POA: Diagnosis not present

## 2017-04-13 DIAGNOSIS — G4730 Sleep apnea, unspecified: Secondary | ICD-10-CM | POA: Diagnosis not present

## 2017-04-13 DIAGNOSIS — M129 Arthropathy, unspecified: Secondary | ICD-10-CM | POA: Diagnosis not present

## 2017-04-13 DIAGNOSIS — M545 Low back pain: Secondary | ICD-10-CM

## 2017-04-13 DIAGNOSIS — I10 Essential (primary) hypertension: Secondary | ICD-10-CM | POA: Diagnosis not present

## 2017-04-13 DIAGNOSIS — K4090 Unilateral inguinal hernia, without obstruction or gangrene, not specified as recurrent: Secondary | ICD-10-CM | POA: Diagnosis not present

## 2017-04-13 DIAGNOSIS — R630 Anorexia: Secondary | ICD-10-CM

## 2017-04-13 DIAGNOSIS — Z5111 Encounter for antineoplastic chemotherapy: Secondary | ICD-10-CM | POA: Diagnosis not present

## 2017-04-13 DIAGNOSIS — Z9181 History of falling: Secondary | ICD-10-CM

## 2017-04-13 DIAGNOSIS — Z7982 Long term (current) use of aspirin: Secondary | ICD-10-CM | POA: Diagnosis not present

## 2017-04-13 DIAGNOSIS — K5090 Crohn's disease, unspecified, without complications: Secondary | ICD-10-CM | POA: Diagnosis not present

## 2017-04-13 DIAGNOSIS — R410 Disorientation, unspecified: Secondary | ICD-10-CM | POA: Diagnosis not present

## 2017-04-13 DIAGNOSIS — R5383 Other fatigue: Secondary | ICD-10-CM | POA: Diagnosis not present

## 2017-04-13 DIAGNOSIS — K219 Gastro-esophageal reflux disease without esophagitis: Secondary | ICD-10-CM | POA: Diagnosis not present

## 2017-04-13 LAB — COMPREHENSIVE METABOLIC PANEL
ALT: 62 U/L (ref 17–63)
AST: 53 U/L — AB (ref 15–41)
Albumin: 3.1 g/dL — ABNORMAL LOW (ref 3.5–5.0)
Alkaline Phosphatase: 998 U/L — ABNORMAL HIGH (ref 38–126)
Anion gap: 9 (ref 5–15)
BILIRUBIN TOTAL: 0.6 mg/dL (ref 0.3–1.2)
BUN: 22 mg/dL — AB (ref 6–20)
CO2: 22 mmol/L (ref 22–32)
CREATININE: 0.91 mg/dL (ref 0.61–1.24)
Calcium: 8.7 mg/dL — ABNORMAL LOW (ref 8.9–10.3)
Chloride: 102 mmol/L (ref 101–111)
GFR calc Af Amer: >60 mL/min (ref >60)
Glucose, Bld: 184 mg/dL — ABNORMAL HIGH (ref 65–99)
Potassium: 4 mmol/L (ref 3.5–5.1)
Sodium: 133 mmol/L — ABNORMAL LOW (ref 135–145)
TOTAL PROTEIN: 7.4 g/dL (ref 6.5–8.1)

## 2017-04-13 LAB — CBC WITH DIFFERENTIAL/PLATELET
BASOS ABS: 0 10*3/uL (ref 0–0.1)
BASOS PCT: 1 %
EOS ABS: 0 10*3/uL (ref 0–0.7)
EOS PCT: 1 %
HCT: 31.2 % — ABNORMAL LOW (ref 40.0–52.0)
Hemoglobin: 10.5 g/dL — ABNORMAL LOW (ref 13.0–18.0)
Lymphocytes Relative: 21 %
Lymphs Abs: 1 10*3/uL (ref 1.0–3.6)
MCH: 28.8 pg (ref 26.0–34.0)
MCHC: 33.7 g/dL (ref 32.0–36.0)
MCV: 85.5 fL (ref 80.0–100.0)
Monocytes Absolute: 0.8 10*3/uL (ref 0.2–1.0)
Monocytes Relative: 17 %
Neutro Abs: 2.8 10*3/uL (ref 1.4–6.5)
Neutrophils Relative %: 60 %
Platelets: 288 10*3/uL (ref 150–440)
RBC: 3.66 MIL/uL — AB (ref 4.40–5.90)
RDW: 16.1 % — ABNORMAL HIGH (ref 11.5–14.5)
WBC: 4.7 10*3/uL (ref 3.8–10.6)

## 2017-04-13 MED ORDER — HEPARIN SOD (PORK) LOCK FLUSH 100 UNIT/ML IV SOLN
INTRAVENOUS | Status: AC
  Filled 2017-04-13: qty 5

## 2017-04-13 MED ORDER — FENTANYL 25 MCG/HR TD PT72: 25.0000 ug | MEDICATED_PATCH | TRANSDERMAL | 0 refills | Status: DC

## 2017-04-13 MED ORDER — SULFAMETHOXAZOLE-TRIMETHOPRIM 800-160 MG PO TABS: 1.0000 | ORAL_TABLET | Freq: Two times a day (BID) | ORAL | 0 refills | Status: DC

## 2017-04-13 MED ORDER — GEMCITABINE HCL CHEMO INJECTION 1 GM/26.3ML
2000.0000 mg | Freq: Once | INTRAVENOUS | Status: AC
  Administered 2017-04-13: 2000 mg via INTRAVENOUS
  Filled 2017-04-13: qty 52.6

## 2017-04-13 MED ORDER — PROCHLORPERAZINE MALEATE 10 MG PO TABS
10.0000 mg | ORAL_TABLET | Freq: Once | ORAL | Status: AC
  Administered 2017-04-13: 10 mg via ORAL
  Filled 2017-04-13: qty 1

## 2017-04-13 MED ORDER — HEPARIN SOD (PORK) LOCK FLUSH 100 UNIT/ML IV SOLN
500.0000 [IU] | Freq: Once | INTRAVENOUS | Status: AC | PRN
  Administered 2017-04-13: 500 [IU]

## 2017-04-13 MED ORDER — SODIUM CHLORIDE 0.9 % IV SOLN
Freq: Once | INTRAVENOUS | Status: AC
  Administered 2017-04-13: 10:00:00 via INTRAVENOUS
  Filled 2017-04-13: qty 1000

## 2017-04-13 NOTE — Progress Notes (Signed)
Here for follow up. Stated feeling better overall. Stated megace has improved appetite. Daughter here w pt as support .

## 2017-04-14 ENCOUNTER — Telehealth: Payer: Self-pay | Admitting: Family Medicine

## 2017-04-14 MED ORDER — PANTOPRAZOLE SODIUM 40 MG PO TBEC
40.0000 mg | DELAYED_RELEASE_TABLET | Freq: Every day | ORAL | 1 refills | Status: DC
Start: 2017-04-14 — End: 2017-04-19

## 2017-04-14 NOTE — Telephone Encounter (Signed)
Copied from [place] [phone]. Topic: Quick Communication - See Telephone Encounter >> Apr 14, 2017 11:42 AM Cleaster Corin, NT wrote: CRM for notification. See Telephone encounter for:   04/14/17. Pt. Calling to see if Dr. Caryl Bis could call something else in for the pt. omeprazole (PRILOSEC) 20 MG capsule [[id number]] isnt working. And he is taking 2 a day. Pt. Would like for nurse or doctor to give him a call about this matter   [place] [place] [address] Phone: [phone] Fax: [phone]

## 2017-04-14 NOTE — Telephone Encounter (Signed)
Protonix sent to pharmacy. Please see what symptoms he is having.

## 2017-04-15 NOTE — Telephone Encounter (Signed)
Noted  

## 2017-04-15 NOTE — Telephone Encounter (Signed)
Patient notified, patient states its the same symptoms and increasing to 2 has not been beneficial. Patient will discontinue Prilosec and keep [place] informed if Protonix is helping.

## 2017-04-18 NOTE — Progress Notes (Signed)
[facility]  [phone]) [phone] Fax:(336) [phone]  ID: Thomas David OB: 05-28-1935  MR#: [id number]  GYF#:[id number]  Patient Care Team: Leone Haven, MD as PCP - General (Family Medicine) Kathyrn Drown, MD (Family Medicine) Clent Jacks, RN as Registered Nurse  CHIEF COMPLAINT: Adenocarcinoma of gallbladder.  INTERVAL HISTORY: Patient returns to clinic today for further evaluation and reconsideration of cycle 1, day 15 of cisplatin and gemcitabine.  Gemcitabine only today.  Performance status continues to improve and he is nearly back to his baseline. He has had no further falls.  He denies any chest pain or shortness of breath.  He does not complain of abdominal pain today. He has no neurologic complaints.  He denies any recent fevers or illnesses. He denies any vomiting, constipation, or diarrhea.  He has no urinary complaints.  Patient otherwise feels well and offers no further specific complaints.  REVIEW OF SYSTEMS:   Review of Systems  Constitutional: Positive for malaise/fatigue. Negative for fever and weight loss.  Respiratory: Negative.  Negative for cough and shortness of breath.   Cardiovascular: Negative for chest pain and leg swelling.  Gastrointestinal: Negative.  Negative for abdominal pain, blood in stool, melena and nausea.  Genitourinary: Negative.  Negative for dysuria and frequency.  Musculoskeletal: Negative.  Negative for falls.  Skin: Negative.  Negative for rash.  Neurological: Positive for weakness.  Psychiatric/Behavioral: The patient is nervous/anxious.     As per HPI. Otherwise, a complete review of systems is negative.  PAST MEDICAL HISTORY: Past Medical History:  Diagnosis Date  . Anxiety   . Arthritis   . Chronic lower back pain   . Crohn's disease ([place])   . Depression   . Difficult intubation    patient denies on 02/12/17  . GERD (gastroesophageal reflux disease)   . Gout   . History of kidney stones 40  years ago  . Hypercholesteremia   . Hypertension   . Prediabetes   . Sleep apnea    cpap     PAST SURGICAL HISTORY: Past Surgical History:  Procedure Laterality Date  . CHOLECYSTECTOMY N/A 11/03/2016   Procedure: LAPAROSCOPIC CHOLECYSTostomy with tube placement ;  Surgeon: Johnathan Hausen, MD;  Location: [facility];  Service: General;  Laterality: N/A;  . CHOLECYSTECTOMY N/A 02/15/2017   Procedure: LAPAROSCOPIC SUBTOTAL CHOLECYSTECTOMY, BIOPSY OF GALLBLADDER;  Surgeon: Johnathan Hausen, MD;  Location: [facility];  Service: General;  Laterality: N/A;  . COLECTOMY  1973   "part of large intestines" (05/04/2012)  . COLON RESECTION    . COLONOSCOPY    . COLONOSCOPY N/A 05/23/2014   Procedure: COLONOSCOPY;  Surgeon: Rogene Houston, MD;  Location: [facility];  Service: Endoscopy;  Laterality: N/A;  1200  . ESOPHAGEAL DILATION N/A 05/23/2014   Procedure: ESOPHAGEAL DILATION;  Surgeon: Rogene Houston, MD;  [facility];  Service: Endoscopy;  Laterality: N/A;  . ESOPHAGOGASTRODUODENOSCOPY N/A 03/16/2014   Procedure: esophageal dilation ;  Surgeon: Rogene Houston, MD;  [facility];  Service: Endoscopy;  Laterality: N/A;  . ESOPHAGOGASTRODUODENOSCOPY N/A 05/23/2014   Procedure: ESOPHAGOGASTRODUODENOSCOPY (EGD);  Surgeon: Rogene Houston, MD;  [facility];  Service: Endoscopy;  Laterality: N/A;  . ESOPHAGOGASTRODUODENOSCOPY (EGD) WITH PROPOFOL N/A 02/18/2017   Procedure: ESOPHAGOGASTRODUODENOSCOPY (EGD) WITH PROPOFOL;  Surgeon: Wonda Horner, MD;  Location: [facility];  Service: Endoscopy;  Laterality: N/A;  . [place]  . IR CHOLANGIOGRAM EXISTING TUBE  11/26/2016  .  KIDNEY STONE SURGERY  1970's  . PARTIAL COLECTOMY  ~ 40 years ago   some of large and small  . PORTA CATH INSERTION N/A 03/17/2017   Procedure: PORTA CATH INSERTION;  Surgeon: Katha Cabal, MD;  Location: [facility];  Service: Cardiovascular;  Laterality: N/A;  . PROSTATE  SURGERY    . THYROIDECTOMY Right 05/04/2012   Procedure: RIGHT HEMI THYROIDECTOMY;  Surgeon: Ascencion Dike, MD;  Location: [facility];  Service: ENT;  Laterality: Right;  . THYROIDECTOMY    . TONSILLECTOMY AND ADENOIDECTOMY     as a child  . TRANSURETHRAL PROSTATECTOMY WITH GYRUS INSTRUMENTS N/A 07/11/2012   Procedure: TRANSURETHRAL PROSTATECTOMY WITH GYRUS INSTRUMENTS;  Surgeon: Claybon Jabs, MD;  Location: [facility];  Service: Urology;  Laterality: N/A;  . UPPER GASTROINTESTINAL ENDOSCOPY      FAMILY HISTORY: Family History  Problem Relation Age of Onset  . Diabetes Mother   . Stroke Father   . Healthy Daughter   . Healthy Son   . Heart disease Sister     ADVANCED DIRECTIVES (Y/N):  N  HEALTH MAINTENANCE: Social History   Tobacco Use  . Smoking status: Never Smoker  . Smokeless tobacco: Never Used  Substance Use Topics  . Alcohol use: No    Alcohol/week: 0.0 oz  . Drug use: No     Colonoscopy:  PAP:  Bone density:  Lipid panel:  No Known Allergies  Current Outpatient Medications  Medication Sig Dispense Refill  . acetaminophen (TYLENOL) 500 MG tablet Take 1,000 mg by mouth every 4 (four) hours as needed for moderate pain or headache.     . allopurinol (ZYLOPRIM) 300 MG tablet Take 1 tablet (300 mg total) by mouth every morning. 90 tablet 3  . amLODipine (NORVASC) 5 MG tablet TAKE 1 TABLET BY MOUTH  DAILY 90 tablet 3  . aspirin EC 81 MG tablet Take 81 mg by mouth at bedtime.    . fentaNYL (DURAGESIC - DOSED MCG/HR) 25 MCG/HR patch Place 1 patch (25 mcg total) onto the skin every 3 (three) days. 10 patch 0  . Histamine Dihydrochloride (AUSTRALIAN DREAM ARTHRITIS EX) Apply 1 application topically 2 (two) times daily as needed (back pain).     Marland Kitchen lidocaine-prilocaine (EMLA) cream Apply to port 1 hours prior to chemotherapy appointment. Cover with plastic wrap. 30 g 3  . losartan (COZAAR) 100 MG tablet Take 1 tablet (100 mg total) by mouth daily. 90 tablet 3  . megestrol (MEGACE)  40 MG tablet Take 1 tablet (40 mg total) by mouth daily. 30 tablet 2  . mesalamine (PENTASA) 500 MG CR capsule Take 1 capsule (500 mg total) by mouth 4 (four) times daily. (Patient taking differently: Take 1,000 mg by mouth 2 (two) times daily. ) 360 capsule 3  . metoprolol tartrate (LOPRESSOR) 50 MG tablet Take 1 tablet (50 mg total) by mouth 2 (two) times daily. 180 tablet 3  . Multiple Vitamin (MULTIVITAMIN WITH MINERALS) TABS tablet Take 2 tablets by mouth daily.    Marland Kitchen omeprazole (PRILOSEC) 20 MG capsule Take 1 capsule (20 mg total) by mouth daily. (Patient taking differently: Take 20 mg by mouth 2 (two) times daily before a meal. ) 90 capsule 3  . ondansetron (ZOFRAN) 8 MG tablet Take 1 tablet (8 mg total) by mouth 2 (two) times daily. 30 tablet 3  . oxybutynin (DITROPAN-XL) 5 MG 24 hr tablet Take 1 tablet (5 mg total) by mouth at bedtime. 90 tablet 1  .  pantoprazole (PROTONIX) 40 MG tablet Take 1 tablet (40 mg total) by mouth daily. 30 tablet 1  . Probiotic Product (PHILLIPS COLON HEALTH PO) Take 1 tablet by mouth daily with supper.     . simvastatin (ZOCOR) 20 MG tablet Take 1 tablet (20 mg total) by mouth daily. 90 tablet 3  . vitamin B-12 (CYANOCOBALAMIN) 1000 MCG tablet Take 1,000 mcg by mouth 2 (two) times daily.    . bisacodyl (DULCOLAX) 10 MG suppository Place 1 suppository (10 mg total) rectally daily as needed for moderate constipation. (Patient not taking: Reported on 03/23/2017) 12 suppository 0  . fluticasone (FLONASE) 50 MCG/ACT nasal spray USE 1 SPRAY INTO BOTH  NOSTRILS DAILY (Patient not taking: Reported on 04/20/2017) 32 g 0  . HYDROcodone-acetaminophen (NORCO/VICODIN) 5-325 MG tablet Take 1-2 tablets by mouth every 4 (four) hours as needed for moderate pain. (Patient not taking: Reported on 04/13/2017) 30 tablet 0  . prochlorperazine (COMPAZINE) 10 MG tablet Take 1 tablet (10 mg total) by mouth every 6 (six) hours as needed for nausea or vomiting. (Patient not taking: Reported on  04/13/2017) 30 tablet 3   No current facility-administered medications for this visit.    Facility-Administered Medications Ordered in Other Visits  Medication Dose Route Frequency Provider Last Rate Last Dose  . 0.9 %  sodium chloride infusion   Intravenous Once Lloyd Huger, MD   Stopped at 04/20/17 1200  . gemcitabine (GEMZAR) 2,000 mg in sodium chloride 0.9 % 250 mL chemo infusion  2,000 mg Intravenous Once Lloyd Huger, MD   Stopped at 04/20/17 1153  . [COMPLETED] heparin lock flush 100 unit/mL  500 Units Intracatheter Once PRN Lloyd Huger, MD   500 Units at 04/20/17 1200  . sodium chloride flush (NS) 0.9 % injection 10 mL  10 mL Intracatheter PRN Lloyd Huger, MD        OBJECTIVE: Vitals:   04/20/17 1012  BP: 112/67  Pulse: 84  Resp: 18  Temp: 97.8 F (36.6 C)     Body mass index is 27.82 kg/m.    ECOG FS:1 - Symptomatic but completely ambulatory  General: Well-developed, well-nourished, no acute distress. Eyes: Pink conjunctiva, anicteric sclera. Lungs: Clear to auscultation bilaterally. Heart: Regular rate and rhythm. No rubs, murmurs, or gallops. Abdomen: Well-healed surgical scar.  Soft, nontender, nondistended. No organomegaly noted, normoactive bowel sounds. Musculoskeletal: No edema, cyanosis, or clubbing. Neuro: Alert, answering all questions appropriately. Cranial nerves grossly intact. Skin: No rashes or petechiae noted. Psych: Normal affect.  LAB RESULTS:  Lab Results  Component Value Date   NA 131 (L) 04/20/2017   K 4.3 04/20/2017   CL 102 04/20/2017   CO2 20 (L) 04/20/2017   GLUCOSE 144 (H) 04/20/2017   BUN 22 (H) 04/20/2017   CREATININE 1.08 04/20/2017   CALCIUM 8.9 04/20/2017   PROT 7.6 04/20/2017   ALBUMIN 2.9 (L) 04/20/2017   AST 47 (H) 04/20/2017   ALT 41 04/20/2017   ALKPHOS 983 (H) 04/20/2017   BILITOT 0.3 04/20/2017   GFRNONAA >60 04/20/2017   GFRAA >60 04/20/2017    Lab Results  Component Value Date   WBC  7.2 04/20/2017   NEUTROABS 5.5 04/20/2017   HGB 10.3 (L) 04/20/2017   HCT 30.1 (L) 04/20/2017   MCV 85.5 04/20/2017   PLT 330 04/20/2017     STUDIES: No results found.  ASSESSMENT: Adenocarcinoma of gallbladder.  PLAN:    1. Adenocarcinoma of gallbladder: PET scan results reviewed independently confirming stage  IV disease with omental and liver metastasis.  Despite patient's decreased performance status, he wishes to attempt palliative chemotherapy using gemcitabine and cisplatin on day 1 with gemcitabine only on days 8 and 15.  This will be a 28-day cycle.  Patient has had port placement.  Proceed with cycle 1, day 15 today which is gemcitabine only.  Return to clinic in 2 weeks for consideration of cycle 2, day 1.  Cisplatin has been dose reduced to 40 mg/m given his difficulties with cycle 1.  2.  Poor appetite: Improved.  Appreciate dietary input.  Continue Megace as prescribed.   3.  Hyperglycemia: Monitor closely as patient will be getting dexamethasone as one of his premedications. 4.  Pain: Continue Vicodin and fentanyl patch. 5.  UTI: Resolved.  Approximately 30 minutes was spent in discussion of which greater than 50% was consultation.  Patient expressed understanding and was in agreement with this plan. He also understands that He can call clinic at any time with any questions, concerns, or complaints.   Cancer Staging Adenocarcinoma of gallbladder [facility]) Staging form: Gallbladder, AJCC 8th Edition - Clinical stage from 03/03/2017: Stage IVB (cT3, cN0, cM1) - Signed by Lloyd Huger, MD on 03/23/2017   Lloyd Huger, MD   04/20/2017 11:39 AM

## 2017-04-19 MED ORDER — PANTOPRAZOLE SODIUM 40 MG PO TBEC: 40.0000 mg | DELAYED_RELEASE_TABLET | Freq: Every day | ORAL | 1 refills | Status: DC

## 2017-04-19 NOTE — Addendum Note (Signed)
Addended by: Elpidio Galea T on: 04/19/2017 10:50 AM   Modules accepted: Orders

## 2017-04-19 NOTE — Telephone Encounter (Signed)
Patient is calling because [facility] states they do not have RX. I notified patient it was sent to the wrong pharmacy and went to [facility]. Patient is going to call and see how much it is at [facility].

## 2017-04-19 NOTE — Telephone Encounter (Signed)
Medication has been sent to correct pharmacy.

## 2017-04-20 ENCOUNTER — Inpatient Hospital Stay (HOSPITAL_BASED_OUTPATIENT_CLINIC_OR_DEPARTMENT_OTHER): Payer: Medicare Other | Admitting: Oncology

## 2017-04-20 ENCOUNTER — Inpatient Hospital Stay: Payer: Medicare Other

## 2017-04-20 ENCOUNTER — Encounter: Payer: Self-pay | Admitting: Oncology

## 2017-04-20 VITALS — BP 112/67 | HR 84 | Temp 97.8°F | Resp 18 | Wt 188.4 lb

## 2017-04-20 DIAGNOSIS — K5090 Crohn's disease, unspecified, without complications: Secondary | ICD-10-CM | POA: Diagnosis not present

## 2017-04-20 DIAGNOSIS — R5383 Other fatigue: Secondary | ICD-10-CM | POA: Diagnosis not present

## 2017-04-20 DIAGNOSIS — K219 Gastro-esophageal reflux disease without esophagitis: Secondary | ICD-10-CM

## 2017-04-20 DIAGNOSIS — K4090 Unilateral inguinal hernia, without obstruction or gangrene, not specified as recurrent: Secondary | ICD-10-CM | POA: Diagnosis not present

## 2017-04-20 DIAGNOSIS — Z79899 Other long term (current) drug therapy: Secondary | ICD-10-CM | POA: Diagnosis not present

## 2017-04-20 DIAGNOSIS — C23 Malignant neoplasm of gallbladder: Secondary | ICD-10-CM

## 2017-04-20 DIAGNOSIS — C786 Secondary malignant neoplasm of retroperitoneum and peritoneum: Secondary | ICD-10-CM | POA: Diagnosis not present

## 2017-04-20 DIAGNOSIS — R410 Disorientation, unspecified: Secondary | ICD-10-CM | POA: Diagnosis not present

## 2017-04-20 DIAGNOSIS — R630 Anorexia: Secondary | ICD-10-CM

## 2017-04-20 DIAGNOSIS — C787 Secondary malignant neoplasm of liver and intrahepatic bile duct: Secondary | ICD-10-CM

## 2017-04-20 DIAGNOSIS — M129 Arthropathy, unspecified: Secondary | ICD-10-CM | POA: Diagnosis not present

## 2017-04-20 DIAGNOSIS — Z9181 History of falling: Secondary | ICD-10-CM

## 2017-04-20 DIAGNOSIS — I10 Essential (primary) hypertension: Secondary | ICD-10-CM

## 2017-04-20 DIAGNOSIS — M545 Low back pain: Secondary | ICD-10-CM

## 2017-04-20 DIAGNOSIS — R739 Hyperglycemia, unspecified: Secondary | ICD-10-CM | POA: Diagnosis not present

## 2017-04-20 DIAGNOSIS — Z5111 Encounter for antineoplastic chemotherapy: Secondary | ICD-10-CM | POA: Diagnosis not present

## 2017-04-20 DIAGNOSIS — I2510 Atherosclerotic heart disease of native coronary artery without angina pectoris: Secondary | ICD-10-CM

## 2017-04-20 DIAGNOSIS — G4730 Sleep apnea, unspecified: Secondary | ICD-10-CM | POA: Diagnosis not present

## 2017-04-20 DIAGNOSIS — R918 Other nonspecific abnormal finding of lung field: Secondary | ICD-10-CM

## 2017-04-20 DIAGNOSIS — F329 Major depressive disorder, single episode, unspecified: Secondary | ICD-10-CM

## 2017-04-20 DIAGNOSIS — N390 Urinary tract infection, site not specified: Secondary | ICD-10-CM | POA: Diagnosis not present

## 2017-04-20 DIAGNOSIS — Z7982 Long term (current) use of aspirin: Secondary | ICD-10-CM

## 2017-04-20 DIAGNOSIS — E7800 Pure hypercholesterolemia, unspecified: Secondary | ICD-10-CM | POA: Diagnosis not present

## 2017-04-20 DIAGNOSIS — F419 Anxiety disorder, unspecified: Secondary | ICD-10-CM

## 2017-04-20 LAB — COMPREHENSIVE METABOLIC PANEL
ALK PHOS: 983 U/L — AB (ref 38–126)
ALT: 41 U/L (ref 17–63)
AST: 47 U/L — AB (ref 15–41)
Albumin: 2.9 g/dL — ABNORMAL LOW (ref 3.5–5.0)
Anion gap: 9 (ref 5–15)
BILIRUBIN TOTAL: 0.3 mg/dL (ref 0.3–1.2)
BUN: 22 mg/dL — AB (ref 6–20)
CALCIUM: 8.9 mg/dL (ref 8.9–10.3)
CO2: 20 mmol/L — ABNORMAL LOW (ref 22–32)
CREATININE: 1.08 mg/dL (ref 0.61–1.24)
Chloride: 102 mmol/L (ref 101–111)
GFR calc Af Amer: >60 mL/min (ref >60)
GFR calc non Af Amer: >60 mL/min (ref >60)
Glucose, Bld: 144 mg/dL — ABNORMAL HIGH (ref 65–99)
Potassium: 4.3 mmol/L (ref 3.5–5.1)
Sodium: 131 mmol/L — ABNORMAL LOW (ref 135–145)
TOTAL PROTEIN: 7.6 g/dL (ref 6.5–8.1)

## 2017-04-20 LAB — CBC WITH DIFFERENTIAL/PLATELET
BASOS ABS: 0 10*3/uL (ref 0–0.1)
Basophils Relative: 0 %
EOS ABS: 0 10*3/uL (ref 0–0.7)
EOS PCT: 0 %
HCT: 30.1 % — ABNORMAL LOW (ref 40.0–52.0)
Hemoglobin: 10.3 g/dL — ABNORMAL LOW (ref 13.0–18.0)
Lymphocytes Relative: 12 %
Lymphs Abs: 0.9 10*3/uL — ABNORMAL LOW (ref 1.0–3.6)
MCH: 29.1 pg (ref 26.0–34.0)
MCHC: 34 g/dL (ref 32.0–36.0)
MCV: 85.5 fL (ref 80.0–100.0)
Monocytes Absolute: 0.8 10*3/uL (ref 0.2–1.0)
Monocytes Relative: 11 %
Neutro Abs: 5.5 10*3/uL (ref 1.4–6.5)
Neutrophils Relative %: 77 %
PLATELETS: 330 10*3/uL (ref 150–440)
RBC: 3.52 MIL/uL — AB (ref 4.40–5.90)
RDW: 16.1 % — AB (ref 11.5–14.5)
WBC: 7.2 10*3/uL (ref 3.8–10.6)

## 2017-04-20 MED ORDER — HEPARIN SOD (PORK) LOCK FLUSH 100 UNIT/ML IV SOLN
INTRAVENOUS | Status: AC
  Filled 2017-04-20: qty 5

## 2017-04-20 MED ORDER — SODIUM CHLORIDE 0.9 % IV SOLN
Freq: Once | INTRAVENOUS | Status: AC
  Administered 2017-04-20: 11:00:00 via INTRAVENOUS
  Filled 2017-04-20: qty 1000

## 2017-04-20 MED ORDER — HEPARIN SOD (PORK) LOCK FLUSH 100 UNIT/ML IV SOLN
500.0000 [IU] | Freq: Once | INTRAVENOUS | Status: AC | PRN
  Administered 2017-04-20: 500 [IU]

## 2017-04-20 MED ORDER — PROCHLORPERAZINE MALEATE 10 MG PO TABS
10.0000 mg | ORAL_TABLET | Freq: Once | ORAL | Status: AC
  Administered 2017-04-20: 10 mg via ORAL
  Filled 2017-04-20: qty 1

## 2017-04-20 MED ORDER — SODIUM CHLORIDE 0.9% FLUSH
10.0000 mL | INTRAVENOUS | Status: DC | PRN
  Filled 2017-04-20: qty 10

## 2017-04-20 MED ORDER — FENTANYL 25 MCG/HR TD PT72: 25.0000 ug | MEDICATED_PATCH | TRANSDERMAL | 0 refills | Status: DC

## 2017-04-20 MED ORDER — SODIUM CHLORIDE 0.9 % IV SOLN
2000.0000 mg | Freq: Once | INTRAVENOUS | Status: AC
  Administered 2017-04-20: 2000 mg via INTRAVENOUS
  Filled 2017-04-20: qty 52.63

## 2017-04-20 NOTE — Progress Notes (Signed)
Pt in today with daughter.  Request all medication refills be sent to [facility].  Pt very short of breath on exertion.

## 2017-04-23 ENCOUNTER — Telehealth: Payer: Self-pay

## 2017-04-23 NOTE — Telephone Encounter (Signed)
PA has been completed for Duragesic patches by Tillie Rung. Notified daughter Haughey. Oncology Nurse Navigator Documentation  Navigator Location: [facility] (04/23/17 1500)   )Navigator Encounter Type: Telephone (04/23/17 1500)                                                    Time Spent with Patient: 15 (04/23/17 1500)

## 2017-04-27 ENCOUNTER — Telehealth: Payer: Self-pay

## 2017-04-27 ENCOUNTER — Inpatient Hospital Stay: Payer: Medicare Other

## 2017-04-27 DIAGNOSIS — C23 Malignant neoplasm of gallbladder: Secondary | ICD-10-CM

## 2017-04-27 DIAGNOSIS — R300 Dysuria: Secondary | ICD-10-CM

## 2017-04-27 DIAGNOSIS — K4090 Unilateral inguinal hernia, without obstruction or gangrene, not specified as recurrent: Secondary | ICD-10-CM | POA: Diagnosis not present

## 2017-04-27 DIAGNOSIS — Z79899 Other long term (current) drug therapy: Secondary | ICD-10-CM | POA: Diagnosis not present

## 2017-04-27 DIAGNOSIS — R410 Disorientation, unspecified: Secondary | ICD-10-CM | POA: Diagnosis not present

## 2017-04-27 DIAGNOSIS — Z5111 Encounter for antineoplastic chemotherapy: Secondary | ICD-10-CM | POA: Diagnosis not present

## 2017-04-27 DIAGNOSIS — I10 Essential (primary) hypertension: Secondary | ICD-10-CM | POA: Diagnosis not present

## 2017-04-27 DIAGNOSIS — G4730 Sleep apnea, unspecified: Secondary | ICD-10-CM | POA: Diagnosis not present

## 2017-04-27 DIAGNOSIS — R918 Other nonspecific abnormal finding of lung field: Secondary | ICD-10-CM | POA: Diagnosis not present

## 2017-04-27 DIAGNOSIS — C787 Secondary malignant neoplasm of liver and intrahepatic bile duct: Secondary | ICD-10-CM | POA: Diagnosis not present

## 2017-04-27 DIAGNOSIS — R5383 Other fatigue: Secondary | ICD-10-CM | POA: Diagnosis not present

## 2017-04-27 DIAGNOSIS — Z9181 History of falling: Secondary | ICD-10-CM | POA: Diagnosis not present

## 2017-04-27 DIAGNOSIS — N390 Urinary tract infection, site not specified: Secondary | ICD-10-CM | POA: Diagnosis not present

## 2017-04-27 DIAGNOSIS — Z7982 Long term (current) use of aspirin: Secondary | ICD-10-CM | POA: Diagnosis not present

## 2017-04-27 DIAGNOSIS — R739 Hyperglycemia, unspecified: Secondary | ICD-10-CM | POA: Diagnosis not present

## 2017-04-27 DIAGNOSIS — M129 Arthropathy, unspecified: Secondary | ICD-10-CM | POA: Diagnosis not present

## 2017-04-27 DIAGNOSIS — I2510 Atherosclerotic heart disease of native coronary artery without angina pectoris: Secondary | ICD-10-CM | POA: Diagnosis not present

## 2017-04-27 DIAGNOSIS — M545 Low back pain: Secondary | ICD-10-CM | POA: Diagnosis not present

## 2017-04-27 DIAGNOSIS — K219 Gastro-esophageal reflux disease without esophagitis: Secondary | ICD-10-CM | POA: Diagnosis not present

## 2017-04-27 DIAGNOSIS — K5090 Crohn's disease, unspecified, without complications: Secondary | ICD-10-CM | POA: Diagnosis not present

## 2017-04-27 DIAGNOSIS — E7800 Pure hypercholesterolemia, unspecified: Secondary | ICD-10-CM | POA: Diagnosis not present

## 2017-04-27 DIAGNOSIS — C786 Secondary malignant neoplasm of retroperitoneum and peritoneum: Secondary | ICD-10-CM | POA: Diagnosis not present

## 2017-04-27 LAB — URINALYSIS, COMPLETE (UACMP) WITH MICROSCOPIC
Bilirubin Urine: NEGATIVE
Glucose, UA: NEGATIVE mg/dL
Hgb urine dipstick: NEGATIVE
Ketones, ur: NEGATIVE mg/dL
Nitrite: NEGATIVE
PROTEIN: NEGATIVE mg/dL
SPECIFIC GRAVITY, URINE: 1.018 (ref 1.005–1.030)
pH: 5 (ref 5.0–8.0)

## 2017-04-27 NOTE — Telephone Encounter (Signed)
Notified granddaughter, Nira Conn, of U/A results. [name] would like to see culture results before treating. We will notify them once culture is back, usually within 72 hours. Verbalized understanding.  Oncology Nurse Navigator Documentation  Navigator Location: [facility] (04/27/17 1500)   )Navigator Encounter Type: Telephone;Diagnostic Results (04/27/17 1500) Telephone: Outgoing Call;Diagnostic Results (04/27/17 1500)                                                  Time Spent with Patient: 15 (04/27/17 1500)

## 2017-04-27 NOTE — Telephone Encounter (Signed)
Received call from grandaughter, Thomas David. Thomas David starting having burning with urination yesterday. Denies any other symptoms. Spoke with [name], and he would like to have a U/A and culture. Thomas David notified. He will come for lab today. Oncology Nurse Navigator Documentation  Navigator Location: [facility] (04/27/17 0900)   )Navigator Encounter Type: Telephone (04/27/17 0900) Telephone: Symptom Mgt;Incoming Call (04/27/17 0900)                                                  Time Spent with Patient: 15 (04/27/17 0900)

## 2017-04-28 LAB — URINE CULTURE: Culture: NO GROWTH

## 2017-04-30 ENCOUNTER — Other Ambulatory Visit: Payer: Self-pay | Admitting: Oncology

## 2017-04-30 ENCOUNTER — Telehealth: Payer: Self-pay

## 2017-04-30 NOTE — Telephone Encounter (Signed)
Called and notified of Urine Culture results. He states he has had no further burning with urination.   Oncology Nurse Navigator Documentation  Navigator Location: [facility] (04/30/17 1100)   )Navigator Encounter Type: Telephone;Diagnostic Results (04/30/17 1100) Telephone: [place] Call (04/30/17 1100)                                                  Time Spent with Patient: 15 (04/30/17 1100)

## 2017-05-04 ENCOUNTER — Inpatient Hospital Stay: Payer: Medicare Other | Attending: Oncology

## 2017-05-04 ENCOUNTER — Inpatient Hospital Stay: Payer: Medicare Other

## 2017-05-04 ENCOUNTER — Inpatient Hospital Stay (HOSPITAL_BASED_OUTPATIENT_CLINIC_OR_DEPARTMENT_OTHER): Payer: Medicare Other | Admitting: Oncology

## 2017-05-04 ENCOUNTER — Encounter: Payer: Self-pay | Admitting: Oncology

## 2017-05-04 VITALS — BP 138/73 | HR 76 | Temp 97.4°F | Resp 18 | Wt 185.0 lb

## 2017-05-04 DIAGNOSIS — I10 Essential (primary) hypertension: Secondary | ICD-10-CM | POA: Diagnosis not present

## 2017-05-04 DIAGNOSIS — C23 Malignant neoplasm of gallbladder: Secondary | ICD-10-CM

## 2017-05-04 DIAGNOSIS — H547 Unspecified visual loss: Secondary | ICD-10-CM | POA: Insufficient documentation

## 2017-05-04 DIAGNOSIS — Z5111 Encounter for antineoplastic chemotherapy: Secondary | ICD-10-CM | POA: Diagnosis not present

## 2017-05-04 DIAGNOSIS — F418 Other specified anxiety disorders: Secondary | ICD-10-CM | POA: Insufficient documentation

## 2017-05-04 DIAGNOSIS — R0602 Shortness of breath: Secondary | ICD-10-CM | POA: Insufficient documentation

## 2017-05-04 DIAGNOSIS — R634 Abnormal weight loss: Secondary | ICD-10-CM | POA: Diagnosis not present

## 2017-05-04 DIAGNOSIS — Z79899 Other long term (current) drug therapy: Secondary | ICD-10-CM

## 2017-05-04 DIAGNOSIS — K5090 Crohn's disease, unspecified, without complications: Secondary | ICD-10-CM

## 2017-05-04 DIAGNOSIS — Z87442 Personal history of urinary calculi: Secondary | ICD-10-CM

## 2017-05-04 DIAGNOSIS — R531 Weakness: Secondary | ICD-10-CM | POA: Diagnosis not present

## 2017-05-04 DIAGNOSIS — R739 Hyperglycemia, unspecified: Secondary | ICD-10-CM | POA: Diagnosis not present

## 2017-05-04 DIAGNOSIS — E7800 Pure hypercholesterolemia, unspecified: Secondary | ICD-10-CM | POA: Diagnosis not present

## 2017-05-04 DIAGNOSIS — K30 Functional dyspepsia: Secondary | ICD-10-CM | POA: Diagnosis not present

## 2017-05-04 DIAGNOSIS — G4730 Sleep apnea, unspecified: Secondary | ICD-10-CM | POA: Insufficient documentation

## 2017-05-04 DIAGNOSIS — C787 Secondary malignant neoplasm of liver and intrahepatic bile duct: Secondary | ICD-10-CM

## 2017-05-04 DIAGNOSIS — R5383 Other fatigue: Secondary | ICD-10-CM | POA: Insufficient documentation

## 2017-05-04 DIAGNOSIS — M545 Low back pain: Secondary | ICD-10-CM | POA: Insufficient documentation

## 2017-05-04 DIAGNOSIS — K5900 Constipation, unspecified: Secondary | ICD-10-CM | POA: Insufficient documentation

## 2017-05-04 DIAGNOSIS — Z510 Encounter for antineoplastic radiation therapy: Secondary | ICD-10-CM | POA: Diagnosis not present

## 2017-05-04 LAB — CBC WITH DIFFERENTIAL/PLATELET
BASOS ABS: 0 10*3/uL (ref 0–0.1)
BASOS PCT: 1 %
Eosinophils Absolute: 0.1 10*3/uL (ref 0–0.7)
Eosinophils Relative: 1 %
HCT: 27.8 % — ABNORMAL LOW (ref 40.0–52.0)
Hemoglobin: 9.7 g/dL — ABNORMAL LOW (ref 13.0–18.0)
LYMPHS PCT: 15 %
Lymphs Abs: 1.2 10*3/uL (ref 1.0–3.6)
MCH: 30.4 pg (ref 26.0–34.0)
MCHC: 34.9 g/dL (ref 32.0–36.0)
MCV: 87 fL (ref 80.0–100.0)
Monocytes Absolute: 1.1 10*3/uL — ABNORMAL HIGH (ref 0.2–1.0)
Monocytes Relative: 14 %
Neutro Abs: 5.8 10*3/uL (ref 1.4–6.5)
Neutrophils Relative %: 69 %
Platelets: 465 10*3/uL — ABNORMAL HIGH (ref 150–440)
RBC: 3.19 MIL/uL — AB (ref 4.40–5.90)
RDW: 16.2 % — AB (ref 11.5–14.5)
WBC: 8.3 10*3/uL (ref 3.8–10.6)

## 2017-05-04 LAB — COMPREHENSIVE METABOLIC PANEL
ALK PHOS: 856 U/L — AB (ref 38–126)
ALT: 47 U/L (ref 17–63)
ANION GAP: 9 (ref 5–15)
AST: 62 U/L — ABNORMAL HIGH (ref 15–41)
Albumin: 3.1 g/dL — ABNORMAL LOW (ref 3.5–5.0)
BILIRUBIN TOTAL: 0.5 mg/dL (ref 0.3–1.2)
BUN: 21 mg/dL — ABNORMAL HIGH (ref 6–20)
CALCIUM: 8.6 mg/dL — AB (ref 8.9–10.3)
CO2: 18 mmol/L — ABNORMAL LOW (ref 22–32)
Chloride: 109 mmol/L (ref 101–111)
Creatinine, Ser: 0.84 mg/dL (ref 0.61–1.24)
GFR calc Af Amer: >60 mL/min (ref >60)
Glucose, Bld: 148 mg/dL — ABNORMAL HIGH (ref 65–99)
POTASSIUM: 3.8 mmol/L (ref 3.5–5.1)
Sodium: 136 mmol/L (ref 135–145)
TOTAL PROTEIN: 7.4 g/dL (ref 6.5–8.1)

## 2017-05-04 MED ORDER — POTASSIUM CHLORIDE 2 MEQ/ML IV SOLN
Freq: Once | INTRAVENOUS | Status: AC
  Administered 2017-05-04: 10:00:00 via INTRAVENOUS
  Filled 2017-05-04: qty 1000

## 2017-05-04 MED ORDER — SODIUM CHLORIDE 0.9 % IV SOLN
2000.0000 mg | Freq: Once | INTRAVENOUS | Status: AC
  Administered 2017-05-04: 2000 mg via INTRAVENOUS
  Filled 2017-05-04: qty 52.6

## 2017-05-04 MED ORDER — FOSAPREPITANT DIMEGLUMINE INJECTION 150 MG
Freq: Once | INTRAVENOUS | Status: AC
  Administered 2017-05-04: 12:00:00 via INTRAVENOUS
  Filled 2017-05-04: qty 5

## 2017-05-04 MED ORDER — SODIUM CHLORIDE 0.9 % IV SOLN
Freq: Once | INTRAVENOUS | Status: AC
  Administered 2017-05-04: 10:00:00 via INTRAVENOUS
  Filled 2017-05-04: qty 1000

## 2017-05-04 MED ORDER — SODIUM CHLORIDE 0.9 % IV SOLN
40.0000 mg/m2 | Freq: Once | INTRAVENOUS | Status: AC
  Administered 2017-05-04: 84 mg via INTRAVENOUS
  Filled 2017-05-04: qty 84

## 2017-05-04 MED ORDER — PALONOSETRON HCL INJECTION 0.25 MG/5ML
0.2500 mg | Freq: Once | INTRAVENOUS | Status: AC
  Administered 2017-05-04: 0.25 mg via INTRAVENOUS
  Filled 2017-05-04: qty 5

## 2017-05-04 NOTE — Progress Notes (Signed)
Patient here today for follow up with labs and treatment with cisplatin and gemzar. Patient reports having some constipation with the last cycle and is concerned about it happening again. He reports some intermittent shortness of breath with exertion; O2 sat today is 100%.

## 2017-05-04 NOTE — Progress Notes (Signed)
[place]  [phone] Fax:(336) [phone]  ID: Thomas David OB: Feb 05, 1935  MR#: [id number]  TLX#:[id number]  Patient Care Team: Leone Haven, MD as PCP - General (Family Medicine) Kathyrn Drown, MD (Family Medicine) Clent Jacks, RN as Registered Nurse  CHIEF COMPLAINT: Adenocarcinoma of gallbladder.  INTERVAL HISTORY: Patient returns to clinic today for further evaluation and consideration of cycle 2-day 1 of cisplatin and gemcitabine.  Performance status appears to be back to baseline per patient.  He has had no further falls.  He denies any chest pain but admits to occasional exertional shortness of breath relieved with rest.  He denies any abdominal pain. Admits to occasional constipation relieved with MiraLAX.  He has no neurological complaints.  He denies any recent fevers or illnesses.  He admits to occasional lower back pain for which he uses a "cream" that helps.  He does not use prescribed pain medication.  He denies any vomiting or diarrhea.   REVIEW OF SYSTEMS:   Review of Systems  Constitutional: Positive for malaise/fatigue and weight loss. Negative for fever.  Respiratory: Positive for shortness of breath (Exertional). Negative for cough.   Cardiovascular: Negative for chest pain, palpitations, leg swelling and PND.  Gastrointestinal: Positive for constipation (On occasion relieved by MiraLAX). Negative for abdominal pain, blood in stool, melena and nausea.  Genitourinary: Negative.  Negative for dysuria and frequency.  Musculoskeletal: Positive for back pain (Lower back pain chronic). Negative for falls.  Skin: Negative.  Negative for rash.  Neurological: Positive for weakness. Negative for dizziness, tingling and tremors.  Psychiatric/Behavioral: The patient is nervous/anxious.     As per HPI. Otherwise, a complete review of systems is negative.  PAST MEDICAL HISTORY: Past Medical History:  Diagnosis Date  . Anxiety     . Arthritis   . Chronic lower back pain   . Crohn's disease ([place])   . Depression   . Difficult intubation    patient denies on 02/12/17  . GERD (gastroesophageal reflux disease)   . Gout   . History of kidney stones 40 years ago  . Hypercholesteremia   . Hypertension   . Prediabetes   . Sleep apnea    cpap     PAST SURGICAL HISTORY: Past Surgical History:  Procedure Laterality Date  . CHOLECYSTECTOMY N/A 11/03/2016   Procedure: LAPAROSCOPIC CHOLECYSTostomy with tube placement ;  Surgeon: Johnathan Hausen, MD;  Location: [facility];  Service: General;  Laterality: N/A;  . CHOLECYSTECTOMY N/A 02/15/2017   Procedure: LAPAROSCOPIC SUBTOTAL CHOLECYSTECTOMY, BIOPSY OF GALLBLADDER;  Surgeon: Johnathan Hausen, MD;  Location: [facility];  Service: General;  Laterality: N/A;  . COLECTOMY  1973   "part of large intestines" (05/04/2012)  . COLON RESECTION    . COLONOSCOPY    . COLONOSCOPY N/A 05/23/2014   Procedure: COLONOSCOPY;  Surgeon: Rogene Houston, MD;  [facility];  Service: Endoscopy;  Laterality: N/A;  1200  . ESOPHAGEAL DILATION N/A 05/23/2014   Procedure: ESOPHAGEAL DILATION;  Surgeon: Rogene Houston, MD;  [facility];  Service: Endoscopy;  Laterality: N/A;  . ESOPHAGOGASTRODUODENOSCOPY N/A 03/16/2014   Procedure: esophageal dilation ;  Surgeon: Rogene Houston, MD;  [facility];  Service: Endoscopy;  Laterality: N/A;  . ESOPHAGOGASTRODUODENOSCOPY N/A 05/23/2014   Procedure: ESOPHAGOGASTRODUODENOSCOPY (EGD);  Surgeon: Rogene Houston, MD;  [facility];  Service: Endoscopy;  Laterality: N/A;  . ESOPHAGOGASTRODUODENOSCOPY (EGD) WITH PROPOFOL N/A 02/18/2017   Procedure: ESOPHAGOGASTRODUODENOSCOPY (EGD)  WITH PROPOFOL;  Surgeon: Wonda Horner, MD;  Location: [facility];  Service: Endoscopy;  Laterality: N/A;  . [place]  . IR CHOLANGIOGRAM EXISTING TUBE  11/26/2016  . KIDNEY STONE SURGERY  1970's  . PARTIAL COLECTOMY  ~ 40 years  ago   some of large and small  . PORTA CATH INSERTION N/A 03/17/2017   Procedure: PORTA CATH INSERTION;  Surgeon: Katha Cabal, MD;  Location: [facility];  Service: Cardiovascular;  Laterality: N/A;  . PROSTATE SURGERY    . THYROIDECTOMY Right 05/04/2012   Procedure: RIGHT HEMI THYROIDECTOMY;  Surgeon: Ascencion Dike, MD;  Location: [facility];  Service: ENT;  Laterality: Right;  . THYROIDECTOMY    . TONSILLECTOMY AND ADENOIDECTOMY     as a child  . TRANSURETHRAL PROSTATECTOMY WITH GYRUS INSTRUMENTS N/A 07/11/2012   Procedure: TRANSURETHRAL PROSTATECTOMY WITH GYRUS INSTRUMENTS;  Surgeon: Claybon Jabs, MD;  Location: [facility];  Service: Urology;  Laterality: N/A;  . UPPER GASTROINTESTINAL ENDOSCOPY      FAMILY HISTORY: Family History  Problem Relation Age of Onset  . Diabetes Mother   . Stroke Father   . Healthy Daughter   . Healthy Son   . Heart disease Sister     ADVANCED DIRECTIVES (Y/N):  N  HEALTH MAINTENANCE: Social History   Tobacco Use  . Smoking status: Never Smoker  . Smokeless tobacco: Never Used  Substance Use Topics  . Alcohol use: No    Alcohol/week: 0.0 oz  . Drug use: No     Colonoscopy:  PAP:  Bone density:  Lipid panel:  No Known Allergies  Current Outpatient Medications  Medication Sig Dispense Refill  . acetaminophen (TYLENOL) 500 MG tablet Take 1,000 mg by mouth every 4 (four) hours as needed for moderate pain or headache.     . allopurinol (ZYLOPRIM) 300 MG tablet Take 1 tablet (300 mg total) by mouth every morning. 90 tablet 3  . amLODipine (NORVASC) 5 MG tablet TAKE 1 TABLET BY MOUTH  DAILY 90 tablet 3  . aspirin EC 81 MG tablet Take 81 mg by mouth at bedtime.    . fentaNYL (DURAGESIC - DOSED MCG/HR) 25 MCG/HR patch Place 1 patch (25 mcg total) onto the skin every 3 (three) days. 10 patch 0  . Histamine Dihydrochloride (AUSTRALIAN DREAM ARTHRITIS EX) Apply 1 application topically 2 (two) times daily as needed (back pain).     Marland Kitchen  lidocaine-prilocaine (EMLA) cream Apply to port 1 hours prior to chemotherapy appointment. Cover with plastic wrap. 30 g 3  . losartan (COZAAR) 100 MG tablet Take 1 tablet (100 mg total) by mouth daily. 90 tablet 3  . megestrol (MEGACE) 40 MG tablet Take 1 tablet (40 mg total) by mouth daily. 30 tablet 2  . mesalamine (PENTASA) 500 MG CR capsule Take 1 capsule (500 mg total) by mouth 4 (four) times daily. (Patient taking differently: Take 1,000 mg by mouth 2 (two) times daily. ) 360 capsule 3  . metoprolol tartrate (LOPRESSOR) 50 MG tablet Take 1 tablet (50 mg total) by mouth 2 (two) times daily. 180 tablet 3  . Multiple Vitamin (MULTIVITAMIN WITH MINERALS) TABS tablet Take 2 tablets by mouth daily.    Marland Kitchen oxybutynin (DITROPAN-XL) 5 MG 24 hr tablet Take 1 tablet (5 mg total) by mouth at bedtime. 90 tablet 1  . pantoprazole (PROTONIX) 40 MG tablet Take 1 tablet (40 mg total) by mouth daily. 30 tablet 1  . Probiotic Product (  PHILLIPS COLON HEALTH PO) Take 1 tablet by mouth daily with supper.     . simvastatin (ZOCOR) 20 MG tablet Take 1 tablet (20 mg total) by mouth daily. 90 tablet 3  . vitamin B-12 (CYANOCOBALAMIN) 1000 MCG tablet Take 1,000 mcg by mouth 2 (two) times daily.    . bisacodyl (DULCOLAX) 10 MG suppository Place 1 suppository (10 mg total) rectally daily as needed for moderate constipation. (Patient not taking: Reported on 03/23/2017) 12 suppository 0  . fluticasone (FLONASE) 50 MCG/ACT nasal spray USE 1 SPRAY INTO BOTH  NOSTRILS DAILY (Patient not taking: Reported on 04/20/2017) 32 g 0  . HYDROcodone-acetaminophen (NORCO/VICODIN) 5-325 MG tablet Take 1-2 tablets by mouth every 4 (four) hours as needed for moderate pain. (Patient not taking: Reported on 04/13/2017) 30 tablet 0  . ondansetron (ZOFRAN) 8 MG tablet Take 1 tablet (8 mg total) by mouth 2 (two) times daily. (Patient not taking: Reported on 05/04/2017) 30 tablet 3  . prochlorperazine (COMPAZINE) 10 MG tablet Take 1 tablet (10 mg total)  by mouth every 6 (six) hours as needed for nausea or vomiting. (Patient not taking: Reported on 04/13/2017) 30 tablet 3   No current facility-administered medications for this visit.    Facility-Administered Medications Ordered in Other Visits  Medication Dose Route Frequency Provider Last Rate Last Dose  . CISplatin (PLATINOL) 84 mg in sodium chloride 0.9 % 250 mL chemo infusion  40 mg/m2 (Treatment Plan Recorded) Intravenous Once Lloyd Huger, MD      . fosaprepitant (EMEND) 150 mg, dexamethasone (DECADRON) 12 mg in sodium chloride 0.9 % 145 mL IVPB   Intravenous Once Lloyd Huger, MD      . gemcitabine (GEMZAR) 2,000 mg in sodium chloride 0.9 % 250 mL chemo infusion  2,000 mg Intravenous Once Lloyd Huger, MD      . palonosetron (ALOXI) injection 0.25 mg  0.25 mg Intravenous Once Lloyd Huger, MD        OBJECTIVE: Vitals:   05/04/17 0845  BP: 138/73  Pulse: 76  Resp: 18  Temp: (!) 97.4 F (36.3 C)  SpO2: 100%     Body mass index is 27.32 kg/m.    ECOG FS:1 - Symptomatic but completely ambulatory  Physical Exam  Constitutional: He is oriented to person, place, and time and well-developed, well-nourished, and in no distress. Vital signs are normal.  HENT:  Head: Normocephalic and atraumatic.  Eyes: Pupils are equal, round, and reactive to light.  Neck: Normal range of motion.  Cardiovascular: Normal rate, regular rhythm and normal heart sounds.  No murmur heard. Pulmonary/Chest: Effort normal and breath sounds normal. He has no wheezes.  Abdominal: Soft. Normal appearance and bowel sounds are normal. He exhibits no distension. There is no tenderness.  Musculoskeletal: Normal range of motion. He exhibits no edema.  Neurological: He is alert and oriented to person, place, and time. Gait normal.  Skin: Skin is warm and dry. No rash noted.  Psychiatric: Mood, memory, affect and judgment normal.    LAB RESULTS:  Lab Results  Component Value Date   NA  136 05/04/2017   K 3.8 05/04/2017   CL 109 05/04/2017   CO2 18 (L) 05/04/2017   GLUCOSE 148 (H) 05/04/2017   BUN 21 (H) 05/04/2017   CREATININE 0.84 05/04/2017   CALCIUM 8.6 (L) 05/04/2017   PROT 7.4 05/04/2017   ALBUMIN 3.1 (L) 05/04/2017   AST 62 (H) 05/04/2017   ALT 47 05/04/2017   ALKPHOS 856 (  H) 05/04/2017   BILITOT 0.5 05/04/2017   GFRNONAA >60 05/04/2017   GFRAA >60 05/04/2017    Lab Results  Component Value Date   WBC 8.3 05/04/2017   NEUTROABS 5.8 05/04/2017   HGB 9.7 (L) 05/04/2017   HCT 27.8 (L) 05/04/2017   MCV 87.0 05/04/2017   PLT 465 (H) 05/04/2017     STUDIES: No results found.  ASSESSMENT: Adenocarcinoma of gallbladder.  PLAN:    1. Adenocarcinoma of gallbladder: PET scan results reviewed independently confirming stage IV disease with omental and liver metastasis.  Patient had a tough time with cycle 1 but wishes to continue with palliative gemcitabine and cisplatin on day 1 with gemcitabine only on days 8 and 15.  This will be a 28-day cycle.  We will proceed with cycle 2-day 1 of gemcitabine and cisplatin todays. Labs ok.  Cisplatin continues to be dose reduced given difficulties with cycle 1. 2.  Appetite: This has improved.  Patient is being followed by a dietitian.  To continue Megace as prescribed.  Albumin has improved.   3. Hyperglycemia: Continue to monitor.  Dexamethasone is one of his premedications. 4.  Pain: Continue cream for back as needed and prescription pain medication.  5.  Constipation: Continue MiraLAX as needed. 6.  Exertional shortness of breath: Oxygen saturations 100% today.  We will continue to monitor his labs.  Hemoglobin today Is 9.7. Could be from anemia.  7.  Elevated liver enzymes: Continue to monitor.  Patient has liver metastases.  Approximately 30 minutes was spent in discussion of which greater than 50% was consultation.  Patient expressed understanding and was in agreement with this plan. He also understands that He  can call clinic at any time with any questions, concerns, or complaints.   Cancer Staging Adenocarcinoma of gallbladder [facility]) Staging form: Gallbladder, AJCC 8th Edition - Clinical stage from 03/03/2017: Stage IVB (cT3, cN0, cM1) - Signed by Lloyd Huger, MD on 03/23/2017   Jacquelin Hawking, NP   05/04/2017 10:55 AM

## 2017-05-09 NOTE — Progress Notes (Signed)
[place]  Telephone:(336) [phone] Fax:(336) [phone]  ID: Thomas David OB: 1935-04-30  MR#: [id number]  EPP#:[id number]  Patient Care Team: Leone Haven, MD as PCP - General (Family Medicine) Kathyrn Drown, MD (Family Medicine) Clent Jacks, RN as Registered Nurse  CHIEF COMPLAINT: Adenocarcinoma of gallbladder.  INTERVAL HISTORY: Patient returns to clinic today for further evaluation and reconsideration of cycle 2, day 8 of cisplatin and gemcitabine.  Gemcitabine only today.  He continues to have issues with constipation despite taking MiraLAX.  He also reports increased indigestion and burping.  He has worsening vision as well.  He denies any chest pain or shortness of breath.  He does not complain of abdominal pain today. He has no neurologic complaints.  He denies any recent fevers or illnesses. He denies any nausea, vomiting, or diarrhea.  He has no urinary complaints.  Patient otherwise feels well and offers no further specific complaints.  REVIEW OF SYSTEMS:   Review of Systems  Constitutional: Negative.  Negative for fever, malaise/fatigue and weight loss.  Eyes: Positive for blurred vision.  Respiratory: Negative.  Negative for cough and shortness of breath.   Cardiovascular: Negative.  Negative for chest pain and leg swelling.  Gastrointestinal: Positive for constipation. Negative for abdominal pain, blood in stool, melena and nausea.  Genitourinary: Negative.  Negative for dysuria and frequency.  Musculoskeletal: Negative.  Negative for falls.  Skin: Negative.  Negative for rash.  Neurological: Negative.  Negative for sensory change, focal weakness and weakness.  Psychiatric/Behavioral: Negative.  The patient is not nervous/anxious.     As per HPI. Otherwise, a complete review of systems is negative.  PAST MEDICAL HISTORY: Past Medical History:  Diagnosis Date  . Anxiety   . Arthritis   . Chronic lower back pain   . Crohn's disease  ([place])   . Depression   . Difficult intubation    patient denies on 02/12/17  . GERD (gastroesophageal reflux disease)   . Gout   . History of kidney stones 40 years ago  . Hypercholesteremia   . Hypertension   . Prediabetes   . Sleep apnea    cpap     PAST SURGICAL HISTORY: Past Surgical History:  Procedure Laterality Date  . CHOLECYSTECTOMY N/A 11/03/2016   Procedure: LAPAROSCOPIC CHOLECYSTostomy with tube placement ;  Surgeon: Johnathan Hausen, MD;  Location: [facility];  Service: General;  Laterality: N/A;  . CHOLECYSTECTOMY N/A 02/15/2017   Procedure: LAPAROSCOPIC SUBTOTAL CHOLECYSTECTOMY, BIOPSY OF GALLBLADDER;  Surgeon: Johnathan Hausen, MD;  Location[facility];  Service: General;  Laterality: N/A;  . COLECTOMY  1973   "part of large intestines" (05/04/2012)  . COLON RESECTION    . COLONOSCOPY    . COLONOSCOPY N/A 05/23/2014   Procedure: COLONOSCOPY;  Surgeon: Rogene Houston, MD;  [facility];  Service: Endoscopy;  Laterality: N/A;  1200  . ESOPHAGEAL DILATION N/A 05/23/2014   Procedure: ESOPHAGEAL DILATION;  Surgeon: Rogene Houston, MD;  [facility];  Service: Endoscopy;  Laterality: N/A;  . ESOPHAGOGASTRODUODENOSCOPY N/A 03/16/2014   Procedure: esophageal dilation ;  Surgeon: Rogene Houston, MD;  [facility];  Service: Endoscopy;  Laterality: N/A;  . ESOPHAGOGASTRODUODENOSCOPY N/A 05/23/2014   Procedure: ESOPHAGOGASTRODUODENOSCOPY (EGD);  Surgeon: Rogene Houston, MD;  [facility];  Service: Endoscopy;  Laterality: N/A;  . ESOPHAGOGASTRODUODENOSCOPY (EGD) WITH PROPOFOL N/A 02/18/2017   Procedure: ESOPHAGOGASTRODUODENOSCOPY (EGD) WITH PROPOFOL;  Surgeon: Wonda Horner, MD;  [facility];  Service: Endoscopy;  Laterality: N/A;  . [place]  . IR CHOLANGIOGRAM EXISTING TUBE  11/26/2016  . KIDNEY STONE SURGERY  1970's  . PARTIAL COLECTOMY  ~ 40 years ago   some of large and small  . PORTA CATH INSERTION N/A  03/17/2017   Procedure: PORTA CATH INSERTION;  Surgeon: Katha Cabal, MD;  Location: [facility];  Service: Cardiovascular;  Laterality: N/A;  . PROSTATE SURGERY    . THYROIDECTOMY Right 05/04/2012   Procedure: RIGHT HEMI THYROIDECTOMY;  Surgeon: Ascencion Dike, MD;  Location: [facility];  Service: ENT;  Laterality: Right;  . THYROIDECTOMY    . TONSILLECTOMY AND ADENOIDECTOMY     as a child  . TRANSURETHRAL PROSTATECTOMY WITH GYRUS INSTRUMENTS N/A 07/11/2012   Procedure: TRANSURETHRAL PROSTATECTOMY WITH GYRUS INSTRUMENTS;  Surgeon: Claybon Jabs, MD;  [facility];  Service: Urology;  Laterality: N/A;  . UPPER GASTROINTESTINAL ENDOSCOPY      FAMILY HISTORY: Family History  Problem Relation Age of Onset  . Diabetes Mother   . Stroke Father   . Healthy Daughter   . Healthy Son   . Heart disease Sister     ADVANCED DIRECTIVES (Y/N):  N  HEALTH MAINTENANCE: Social History   Tobacco Use  . Smoking status: Never Smoker  . Smokeless tobacco: Never Used  Substance Use Topics  . Alcohol use: No    Alcohol/week: 0.0 oz  . Drug use: No     Colonoscopy:  PAP:  Bone density:  Lipid panel:  No Known Allergies  Current Outpatient Medications  Medication Sig Dispense Refill  . acetaminophen (TYLENOL) 500 MG tablet Take 1,000 mg by mouth every 4 (four) hours as needed for moderate pain or headache.     . allopurinol (ZYLOPRIM) 300 MG tablet Take 1 tablet (300 mg total) by mouth every morning. 90 tablet 3  . amLODipine (NORVASC) 5 MG tablet TAKE 1 TABLET BY MOUTH  DAILY 90 tablet 3  . aspirin EC 81 MG tablet Take 81 mg by mouth at bedtime.    . fentaNYL (DURAGESIC - DOSED MCG/HR) 25 MCG/HR patch Place 1 patch (25 mcg total) onto the skin every 3 (three) days. 10 patch 0  . Histamine Dihydrochloride (AUSTRALIAN DREAM ARTHRITIS EX) Apply 1 application topically 2 (two) times daily as needed (back pain).     Marland Kitchen lidocaine-prilocaine (EMLA) cream Apply to port 1 hours prior to  chemotherapy appointment. Cover with plastic wrap. 30 g 3  . losartan (COZAAR) 100 MG tablet Take 1 tablet (100 mg total) by mouth daily. 90 tablet 3  . megestrol (MEGACE) 40 MG tablet Take 1 tablet (40 mg total) by mouth daily. 30 tablet 2  . mesalamine (PENTASA) 500 MG CR capsule Take 1 capsule (500 mg total) by mouth 4 (four) times daily. (Patient taking differently: Take 1,000 mg by mouth 2 (two) times daily. ) 360 capsule 3  . metoprolol tartrate (LOPRESSOR) 50 MG tablet Take 1 tablet (50 mg total) by mouth 2 (two) times daily. 180 tablet 3  . Multiple Vitamin (MULTIVITAMIN WITH MINERALS) TABS tablet Take 2 tablets by mouth daily.    Marland Kitchen oxybutynin (DITROPAN-XL) 5 MG 24 hr tablet Take 1 tablet (5 mg total) by mouth at bedtime. 90 tablet 1  . pantoprazole (PROTONIX) 40 MG tablet Take 1 tablet (40 mg total) by mouth daily. 30 tablet 1  . Probiotic Product (PHILLIPS COLON HEALTH PO) Take 1 tablet by mouth  daily with supper.     . simvastatin (ZOCOR) 20 MG tablet Take 1 tablet (20 mg total) by mouth daily. 90 tablet 3  . vitamin B-12 (CYANOCOBALAMIN) 1000 MCG tablet Take 1,000 mcg by mouth 2 (two) times daily.    . bisacodyl (DULCOLAX) 10 MG suppository Place 1 suppository (10 mg total) rectally daily as needed for moderate constipation. (Patient not taking: Reported on 03/23/2017) 12 suppository 0  . fluticasone (FLONASE) 50 MCG/ACT nasal spray USE 1 SPRAY INTO BOTH  NOSTRILS DAILY (Patient not taking: Reported on 04/20/2017) 32 g 0  . HYDROcodone-acetaminophen (NORCO/VICODIN) 5-325 MG tablet Take 1-2 tablets by mouth every 4 (four) hours as needed for moderate pain. (Patient not taking: Reported on 04/13/2017) 30 tablet 0  . ondansetron (ZOFRAN) 8 MG tablet Take 1 tablet (8 mg total) by mouth 2 (two) times daily. (Patient not taking: Reported on 05/04/2017) 30 tablet 3  . prochlorperazine (COMPAZINE) 10 MG tablet Take 1 tablet (10 mg total) by mouth every 6 (six) hours as needed for nausea or vomiting.  (Patient not taking: Reported on 04/13/2017) 30 tablet 3   No current facility-administered medications for this visit.    Facility-Administered Medications Ordered in Other Visits  Medication Dose Route Frequency Provider Last Rate Last Dose  . heparin lock flush 100 unit/mL  500 Units Intracatheter PRN Lloyd Huger, MD        OBJECTIVE: Vitals:   05/11/17 0912  BP: 112/75  Pulse: 80  Resp: 18  Temp: 97.8 F (36.6 C)     Body mass index is 27.33 kg/m.    ECOG FS:1 - Symptomatic but completely ambulatory  General: Well-developed, well-nourished, no acute distress. Eyes: Pink conjunctiva, anicteric sclera. Lungs: Clear to auscultation bilaterally. Heart: Regular rate and rhythm. No rubs, murmurs, or gallops. Abdomen: Well-healed surgical scar.  Soft, nontender, nondistended. No organomegaly noted, normoactive bowel sounds. Musculoskeletal: No edema, cyanosis, or clubbing. Neuro: Alert, answering all questions appropriately. Cranial nerves grossly intact. Skin: No rashes or petechiae noted. Psych: Normal affect.   LAB RESULTS:  Lab Results  Component Value Date   NA 132 (L) 05/11/2017   K 3.8 05/11/2017   CL 100 (L) 05/11/2017   CO2 21 (L) 05/11/2017   GLUCOSE 154 (H) 05/11/2017   BUN 32 (H) 05/11/2017   CREATININE 1.14 05/11/2017   CALCIUM 8.7 (L) 05/11/2017   PROT 7.3 05/11/2017   ALBUMIN 3.4 (L) 05/11/2017   AST 85 (H) 05/11/2017   ALT 69 (H) 05/11/2017   ALKPHOS 791 (H) 05/11/2017   BILITOT 0.5 05/11/2017   GFRNONAA 59 (L) 05/11/2017   GFRAA >60 05/11/2017    Lab Results  Component Value Date   WBC 6.4 05/11/2017   NEUTROABS 4.3 05/11/2017   HGB 10.0 (L) 05/11/2017   HCT 28.6 (L) 05/11/2017   MCV 86.2 05/11/2017   PLT 377 05/11/2017     STUDIES: No results found.  ASSESSMENT: Adenocarcinoma of gallbladder.  PLAN:    1. Adenocarcinoma of gallbladder: PET scan results from March 19, 2017 reviewed independently confirming stage IV disease  with omental and liver metastasis.  Continue with palliative chemotherapy using gemcitabine and cisplatin on day 1 with gemcitabine only on days 8 and 15.  This will be a 28-day cycle.  Patient has had port placement.  Proceed with cycle 2, day 8 today which is gemcitabine only.  Return to clinic in 1 week for consideration of cycle 2, day 15.  Cisplatin has been dose reduced to 40  mg/m given his difficulties with cycle 1.  2.  Poor appetite: Improved.  Appreciate dietary input.  Continue Megace as prescribed.   3.  Hyperglycemia: Monitor closely as patient will be getting dexamethasone as one of his premedications. 4.  Pain: Patient does not complain of this today.  Continue Vicodin and fentanyl patch. 5.  Constipation: Continue MiraLAX and have recommended a regular schedule rather than intermittent dosing. 6.  Burping/indigestion: Recommended OTC remedies. 7.  Hyponatremia: Mild, monitor. 8.  Elevated liver enzymes: Proceed with treatment as above.  Monitor. 9.  Anemia: Hemoglobin decreased, but stable.   Patient expressed understanding and was in agreement with this plan. He also understands that He can call clinic at any time with any questions, concerns, or complaints.   Cancer Staging Adenocarcinoma of gallbladder [facility]) Staging form: Gallbladder, AJCC 8th Edition - Clinical stage from 03/03/2017: Stage IVB (cT3, cN0, cM1) - Signed by Lloyd Huger, MD on 03/23/2017   Lloyd Huger, MD   05/14/2017 9:27 AM

## 2017-05-11 ENCOUNTER — Encounter: Payer: Self-pay | Admitting: Oncology

## 2017-05-11 ENCOUNTER — Inpatient Hospital Stay: Payer: Medicare Other | Admitting: *Deleted

## 2017-05-11 ENCOUNTER — Inpatient Hospital Stay (HOSPITAL_BASED_OUTPATIENT_CLINIC_OR_DEPARTMENT_OTHER): Payer: Medicare Other | Admitting: Oncology

## 2017-05-11 ENCOUNTER — Inpatient Hospital Stay: Payer: Medicare Other

## 2017-05-11 VITALS — BP 112/75 | HR 80 | Temp 97.8°F | Resp 18 | Wt 185.1 lb

## 2017-05-11 DIAGNOSIS — Z87442 Personal history of urinary calculi: Secondary | ICD-10-CM

## 2017-05-11 DIAGNOSIS — R634 Abnormal weight loss: Secondary | ICD-10-CM

## 2017-05-11 DIAGNOSIS — C23 Malignant neoplasm of gallbladder: Secondary | ICD-10-CM

## 2017-05-11 DIAGNOSIS — Z5111 Encounter for antineoplastic chemotherapy: Secondary | ICD-10-CM | POA: Diagnosis not present

## 2017-05-11 DIAGNOSIS — R0602 Shortness of breath: Secondary | ICD-10-CM | POA: Diagnosis not present

## 2017-05-11 DIAGNOSIS — M545 Low back pain: Secondary | ICD-10-CM | POA: Diagnosis not present

## 2017-05-11 DIAGNOSIS — K5090 Crohn's disease, unspecified, without complications: Secondary | ICD-10-CM | POA: Diagnosis not present

## 2017-05-11 DIAGNOSIS — E7800 Pure hypercholesterolemia, unspecified: Secondary | ICD-10-CM | POA: Diagnosis not present

## 2017-05-11 DIAGNOSIS — R531 Weakness: Secondary | ICD-10-CM | POA: Diagnosis not present

## 2017-05-11 DIAGNOSIS — H547 Unspecified visual loss: Secondary | ICD-10-CM

## 2017-05-11 DIAGNOSIS — C787 Secondary malignant neoplasm of liver and intrahepatic bile duct: Secondary | ICD-10-CM

## 2017-05-11 DIAGNOSIS — G4730 Sleep apnea, unspecified: Secondary | ICD-10-CM | POA: Diagnosis not present

## 2017-05-11 DIAGNOSIS — Z95828 Presence of other vascular implants and grafts: Secondary | ICD-10-CM

## 2017-05-11 DIAGNOSIS — I10 Essential (primary) hypertension: Secondary | ICD-10-CM | POA: Diagnosis not present

## 2017-05-11 DIAGNOSIS — K30 Functional dyspepsia: Secondary | ICD-10-CM | POA: Diagnosis not present

## 2017-05-11 DIAGNOSIS — R5383 Other fatigue: Secondary | ICD-10-CM

## 2017-05-11 DIAGNOSIS — R739 Hyperglycemia, unspecified: Secondary | ICD-10-CM | POA: Diagnosis not present

## 2017-05-11 DIAGNOSIS — Z79899 Other long term (current) drug therapy: Secondary | ICD-10-CM

## 2017-05-11 DIAGNOSIS — K5900 Constipation, unspecified: Secondary | ICD-10-CM | POA: Diagnosis not present

## 2017-05-11 DIAGNOSIS — F418 Other specified anxiety disorders: Secondary | ICD-10-CM

## 2017-05-11 LAB — CBC WITH DIFFERENTIAL/PLATELET
BASOS ABS: 0 10*3/uL (ref 0–0.1)
BASOS PCT: 1 %
EOS ABS: 0.1 10*3/uL (ref 0–0.7)
Eosinophils Relative: 1 %
HCT: 28.6 % — ABNORMAL LOW (ref 40.0–52.0)
HEMOGLOBIN: 10 g/dL — AB (ref 13.0–18.0)
Lymphocytes Relative: 19 %
Lymphs Abs: 1.2 10*3/uL (ref 1.0–3.6)
MCH: 30.2 pg (ref 26.0–34.0)
MCHC: 35.1 g/dL (ref 32.0–36.0)
MCV: 86.2 fL (ref 80.0–100.0)
Monocytes Absolute: 0.8 10*3/uL (ref 0.2–1.0)
Monocytes Relative: 13 %
NEUTROS ABS: 4.3 10*3/uL (ref 1.4–6.5)
NEUTROS PCT: 66 %
Platelets: 377 10*3/uL (ref 150–440)
RBC: 3.32 MIL/uL — AB (ref 4.40–5.90)
RDW: 16.3 % — ABNORMAL HIGH (ref 11.5–14.5)
WBC: 6.4 10*3/uL (ref 3.8–10.6)

## 2017-05-11 LAB — COMPREHENSIVE METABOLIC PANEL
ALK PHOS: 791 U/L — AB (ref 38–126)
ALT: 69 U/L — AB (ref 17–63)
ANION GAP: 11 (ref 5–15)
AST: 85 U/L — ABNORMAL HIGH (ref 15–41)
Albumin: 3.4 g/dL — ABNORMAL LOW (ref 3.5–5.0)
BUN: 32 mg/dL — ABNORMAL HIGH (ref 6–20)
CALCIUM: 8.7 mg/dL — AB (ref 8.9–10.3)
CO2: 21 mmol/L — ABNORMAL LOW (ref 22–32)
CREATININE: 1.14 mg/dL (ref 0.61–1.24)
Chloride: 100 mmol/L — ABNORMAL LOW (ref 101–111)
GFR, EST NON AFRICAN AMERICAN: 59 mL/min — AB (ref >60)
Glucose, Bld: 154 mg/dL — ABNORMAL HIGH (ref 65–99)
Potassium: 3.8 mmol/L (ref 3.5–5.1)
Sodium: 132 mmol/L — ABNORMAL LOW (ref 135–145)
Total Bilirubin: 0.5 mg/dL (ref 0.3–1.2)
Total Protein: 7.3 g/dL (ref 6.5–8.1)

## 2017-05-11 MED ORDER — PROCHLORPERAZINE MALEATE 10 MG PO TABS
10.0000 mg | ORAL_TABLET | Freq: Once | ORAL | Status: AC
  Administered 2017-05-11: 10 mg via ORAL
  Filled 2017-05-11: qty 1

## 2017-05-11 MED ORDER — HEPARIN SOD (PORK) LOCK FLUSH 100 UNIT/ML IV SOLN
500.0000 [IU] | Freq: Once | INTRAVENOUS | Status: AC | PRN
  Administered 2017-05-11: 500 [IU]
  Filled 2017-05-11: qty 5

## 2017-05-11 MED ORDER — SODIUM CHLORIDE 0.9 % IV SOLN
2000.0000 mg | Freq: Once | INTRAVENOUS | Status: AC
  Administered 2017-05-11: 2000 mg via INTRAVENOUS
  Filled 2017-05-11: qty 52.6

## 2017-05-11 MED ORDER — HEPARIN SOD (PORK) LOCK FLUSH 100 UNIT/ML IV SOLN: 500.0000 [IU] | INTRAVENOUS | Status: DC | PRN

## 2017-05-11 MED ORDER — SODIUM CHLORIDE 0.9% FLUSH
10.0000 mL | INTRAVENOUS | Status: AC | PRN
  Administered 2017-05-11: 10 mL
  Filled 2017-05-11: qty 10

## 2017-05-11 MED ORDER — SODIUM CHLORIDE 0.9 % IV SOLN
Freq: Once | INTRAVENOUS | Status: AC
  Administered 2017-05-11: 10:00:00 via INTRAVENOUS
  Filled 2017-05-11: qty 1000

## 2017-05-11 MED ORDER — SODIUM CHLORIDE 0.9% FLUSH
10.0000 mL | INTRAVENOUS | Status: DC | PRN
Start: 2017-05-11 — End: 2017-05-11
  Filled 2017-05-11: qty 10

## 2017-05-11 NOTE — Progress Notes (Signed)
Patient here today for follow up and treatment consideration. Patient reports continued issues with constipation, has been taking miralax. Also reports continued burping that has not been relieved with medications. Patient reports shortness of breath, RA 02 sats 100% today. Patient also reports vision changes, blurred vision since beginning treatments. Patient and family are requesting a break from chemo on weeks 4/23 and 4/30 due to not having a caregiver that time period.

## 2017-05-11 NOTE — Progress Notes (Signed)
Per [name] okay to proceed with treatment with 05/11/17 CMP ( Sodium 132, AST 85 and alkaline Phosphatase 791).

## 2017-05-16 NOTE — Progress Notes (Addendum)
[place]  Telephone:([phone]) [phone] Fax:(336) [phone]  ID: Thomas David OB: [birth year]  MR#: [id number]  EVO#:[id number]  Patient Care Team: Leone Haven, MD as PCP - General (Family Medicine) Kathyrn Drown, MD (Family Medicine) Clent Jacks, RN as Registered Nurse  CHIEF COMPLAINT: Adenocarcinoma of gallbladder.  INTERVAL HISTORY: Patient returns to clinic today for further evaluation and reconsideration of cycle 2, day 15 of cisplatin and gemcitabine.  Gemcitabine only today.  He continues to have constipation and states he has not had a bowel movement 3 days.  His indigestion has improved.  He continues to complain of intermittent visual problems.  He denies any chest pain or shortness of breath.  He does not complain of abdominal pain today. He has no neurologic complaints.  He denies any recent fevers or illnesses. He denies any nausea, vomiting, or diarrhea.  He has no urinary complaints.  Patient offers no further specific complaints.  REVIEW OF SYSTEMS:   Review of Systems  Constitutional: Negative.  Negative for fever, malaise/fatigue and weight loss.  Eyes: Positive for blurred vision.  Respiratory: Negative.  Negative for cough and shortness of breath.   Cardiovascular: Negative.  Negative for chest pain and leg swelling.  Gastrointestinal: Positive for constipation and heartburn. Negative for abdominal pain, blood in stool, melena and nausea.  Genitourinary: Negative.  Negative for dysuria and frequency.  Musculoskeletal: Negative.  Negative for falls.  Skin: Negative.  Negative for rash.  Neurological: Negative.  Negative for sensory change, focal weakness and weakness.  Psychiatric/Behavioral: Negative.  The patient is not nervous/anxious.     As per HPI. Otherwise, a complete review of systems is negative.  PAST MEDICAL HISTORY: Past Medical History:  Diagnosis Date  . Anxiety   . Arthritis   . Chronic lower back pain   . Crohn's  disease ([place])   . Depression   . Difficult intubation    patient denies on 02/12/17  . GERD (gastroesophageal reflux disease)   . Gout   . History of kidney stones 40 years ago  . Hypercholesteremia   . Hypertension   . Prediabetes   . Sleep apnea    cpap     PAST SURGICAL HISTORY: Past Surgical History:  Procedure Laterality Date  . CHOLECYSTECTOMY N/A 11/03/2016   Procedure: LAPAROSCOPIC CHOLECYSTostomy with tube placement ;  Surgeon: Johnathan Hausen, MD;  Location: [facility];  Service: General;  Laterality: N/A;  . CHOLECYSTECTOMY N/A 02/15/2017   Procedure: LAPAROSCOPIC SUBTOTAL CHOLECYSTECTOMY, BIOPSY OF GALLBLADDER;  Surgeon: Johnathan Hausen, MD;  Location: [facility];  Service: General;  Laterality: N/A;  . COLECTOMY  1973   "part of large intestines" (05/04/2012)  . COLON RESECTION    . COLONOSCOPY    . COLONOSCOPY N/A 05/23/2014   Procedure: COLONOSCOPY;  Surgeon: Rogene Houston, MD;  [facility];  Service: Endoscopy;  Laterality: N/A;  1200  . ESOPHAGEAL DILATION N/A 05/23/2014   Procedure: ESOPHAGEAL DILATION;  Surgeon: Rogene Houston, MD;  [facility];  Service: Endoscopy;  Laterality: N/A;  . ESOPHAGOGASTRODUODENOSCOPY N/A 03/16/2014   Procedure: esophageal dilation ;  Surgeon: Rogene Houston, MD;  [facility];  Service: Endoscopy;  Laterality: N/A;  . ESOPHAGOGASTRODUODENOSCOPY N/A 05/23/2014   Procedure: ESOPHAGOGASTRODUODENOSCOPY (EGD);  Surgeon: Rogene Houston, MD;  [facility];  Service: Endoscopy;  Laterality: N/A;  . ESOPHAGOGASTRODUODENOSCOPY (EGD) WITH PROPOFOL N/A 02/18/2017   Procedure: ESOPHAGOGASTRODUODENOSCOPY (EGD) WITH PROPOFOL;  Surgeon: Anson Fret  F, MD;  Location: [facility];  Service: Endoscopy;  Laterality: N/A;  . [place]  . IR CHOLANGIOGRAM EXISTING TUBE  11/26/2016  . KIDNEY STONE SURGERY  1970's  . PARTIAL COLECTOMY  ~ 40 years ago   some of large and small  . PORTA CATH INSERTION  N/A 03/17/2017   Procedure: PORTA CATH INSERTION;  Surgeon: Katha Cabal, MD;  Location: [facility];  Service: Cardiovascular;  Laterality: N/A;  . PROSTATE SURGERY    . THYROIDECTOMY Right 05/04/2012   Procedure: RIGHT HEMI THYROIDECTOMY;  Surgeon: Ascencion Dike, MD;  Location: [facility];  Service: ENT;  Laterality: Right;  . THYROIDECTOMY    . TONSILLECTOMY AND ADENOIDECTOMY     as a child  . TRANSURETHRAL PROSTATECTOMY WITH GYRUS INSTRUMENTS N/A 07/11/2012   Procedure: TRANSURETHRAL PROSTATECTOMY WITH GYRUS INSTRUMENTS;  Surgeon: Claybon Jabs, MD;  Location: [facility];  Service: Urology;  Laterality: N/A;  . UPPER GASTROINTESTINAL ENDOSCOPY      FAMILY HISTORY: Family History  Problem Relation Age of Onset  . Diabetes Mother   . Stroke Father   . Healthy Daughter   . Healthy Son   . Heart disease Sister     ADVANCED DIRECTIVES (Y/N):  N  HEALTH MAINTENANCE: Social History   Tobacco Use  . Smoking status: Never Smoker  . Smokeless tobacco: Never Used  Substance Use Topics  . Alcohol use: No    Alcohol/week: 0.0 oz  . Drug use: No     Colonoscopy:  PAP:  Bone density:  Lipid panel:  No Known Allergies  Current Outpatient Medications  Medication Sig Dispense Refill  . allopurinol (ZYLOPRIM) 300 MG tablet Take 1 tablet (300 mg total) by mouth every morning. 90 tablet 3  . amLODipine (NORVASC) 5 MG tablet TAKE 1 TABLET BY MOUTH  DAILY 90 tablet 3  . aspirin EC 81 MG tablet Take 81 mg by mouth at bedtime.    . fluticasone (FLONASE) 50 MCG/ACT nasal spray USE 1 SPRAY INTO BOTH  NOSTRILS DAILY 32 g 0  . lidocaine-prilocaine (EMLA) cream Apply to port 1 hours prior to chemotherapy appointment. Cover with plastic wrap. 30 g 3  . losartan (COZAAR) 100 MG tablet Take 1 tablet (100 mg total) by mouth daily. 90 tablet 3  . mesalamine (PENTASA) 500 MG CR capsule Take 1 capsule (500 mg total) by mouth 4 (four) times daily. (Patient taking differently: Take 1,000 mg by mouth  2 (two) times daily. ) 360 capsule 3  . metoprolol tartrate (LOPRESSOR) 50 MG tablet Take 1 tablet (50 mg total) by mouth 2 (two) times daily. 180 tablet 3  . Multiple Vitamin (MULTIVITAMIN WITH MINERALS) TABS tablet Take 2 tablets by mouth daily.    . ondansetron (ZOFRAN) 8 MG tablet Take 1 tablet (8 mg total) by mouth 2 (two) times daily. 30 tablet 3  . oxybutynin (DITROPAN-XL) 5 MG 24 hr tablet Take 1 tablet (5 mg total) by mouth at bedtime. 90 tablet 1  . Probiotic Product (PHILLIPS COLON HEALTH PO) Take 1 tablet by mouth daily with supper.     . simvastatin (ZOCOR) 20 MG tablet Take 1 tablet (20 mg total) by mouth daily. 90 tablet 3  . vitamin B-12 (CYANOCOBALAMIN) 1000 MCG tablet Take 1,000 mcg by mouth 2 (two) times daily.    Marland Kitchen acetaminophen (TYLENOL) 500 MG tablet Take 1,000 mg by mouth every 4 (four) hours as needed for moderate pain or headache.     Marland Kitchen  bisacodyl (DULCOLAX) 10 MG suppository Place 1 suppository (10 mg total) rectally daily as needed for moderate constipation. (Patient not taking: Reported on 03/23/2017) 12 suppository 0  . fentaNYL (DURAGESIC - DOSED MCG/HR) 25 MCG/HR patch Place 1 patch (25 mcg total) onto the skin every 3 (three) days. 10 patch 0  . Histamine Dihydrochloride (AUSTRALIAN DREAM ARTHRITIS EX) Apply 1 application topically 2 (two) times daily as needed (back pain).     Marland Kitchen HYDROcodone-acetaminophen (NORCO/VICODIN) 5-325 MG tablet Take 1-2 tablets by mouth every 4 (four) hours as needed for moderate pain. 30 tablet 0  . megestrol (MEGACE) 40 MG tablet Take 1 tablet (40 mg total) by mouth daily. 30 tablet 2  . pantoprazole (PROTONIX) 40 MG tablet Take 1 tablet (40 mg total) by mouth daily. (Patient not taking: Reported on 05/18/2017) 30 tablet 1  . prochlorperazine (COMPAZINE) 10 MG tablet Take 1 tablet (10 mg total) by mouth every 6 (six) hours as needed for nausea or vomiting. (Patient not taking: Reported on 04/13/2017) 30 tablet 3   No current  facility-administered medications for this visit.    Facility-Administered Medications Ordered in Other Visits  Medication Dose Route Frequency Provider Last Rate Last Dose  . gemcitabine (GEMZAR) 2,000 mg in sodium chloride 0.9 % 250 mL chemo infusion  2,000 mg Intravenous Once Lloyd Huger, MD 605 mL/hr at 05/18/17 1027 2,000 mg at 05/18/17 1027  . heparin lock flush 100 unit/mL  500 Units Intravenous Once Lloyd Huger, MD      . heparin lock flush 100 unit/mL  500 Units Intracatheter Once PRN Lloyd Huger, MD        OBJECTIVE: Vitals:   05/18/17 0859  BP: 133/72  Pulse: 73  Resp: 18  Temp: (!) 94 F (34.4 C)     Body mass index is 27.14 kg/m.    ECOG FS:1 - Symptomatic but completely ambulatory  General: Well-developed, well-nourished, no acute distress. Eyes: Pink conjunctiva, anicteric sclera. Lungs: Clear to auscultation bilaterally. Heart: Regular rate and rhythm. No rubs, murmurs, or gallops. Abdomen: Soft, nontender, nondistended. No organomegaly noted, normoactive bowel sounds. Musculoskeletal: No edema, cyanosis, or clubbing. Neuro: Alert, answering all questions appropriately. Cranial nerves grossly intact. Skin: No rashes or petechiae noted. Psych: Normal affect.  LAB RESULTS:  Lab Results  Component Value Date   NA 135 05/18/2017   K 3.8 05/18/2017   CL 105 05/18/2017   CO2 19 (L) 05/18/2017   GLUCOSE 123 (H) 05/18/2017   BUN 29 (H) 05/18/2017   CREATININE 0.95 05/18/2017   CALCIUM 8.6 (L) 05/18/2017   PROT 7.2 05/18/2017   ALBUMIN 3.5 05/18/2017   AST 48 (H) 05/18/2017   ALT 37 05/18/2017   ALKPHOS 596 (H) 05/18/2017   BILITOT 0.7 05/18/2017   GFRNONAA >60 05/18/2017   GFRAA >60 05/18/2017    Lab Results  Component Value Date   WBC 2.7 (L) 05/18/2017   NEUTROABS 1.6 05/18/2017   HGB 9.1 (L) 05/18/2017   HCT 26.2 (L) 05/18/2017   MCV 87.3 05/18/2017   PLT 101 (L) 05/18/2017     STUDIES: No results found.  ASSESSMENT:  Adenocarcinoma of gallbladder.  PLAN:    1. Adenocarcinoma of gallbladder: PET scan results from March 19, 2017 reviewed independently confirming stage IV disease with omental and liver metastasis.  Continue with palliative chemotherapy using gemcitabine and cisplatin on day 1 with gemcitabine only on days 8 and 15.  This will be a 28-day cycle.  Cisplatin has been  dose reduced to 40 mg/m given his difficulties with cycle 1.  Proceed with cycle 2, day 15 today which is gemcitabine only.  Next week is patient scheduled week off, but he wishes to have 2 weeks off so that family members can be here for his treatments.  Therefore return to clinic in 2 weeks for further evaluation and consideration of cycle 3, day 1.  Plan to reimage at the conclusion of cycle 3.  2.  Poor appetite: Improved.  Appreciate dietary input.  Patient was given a refill of his Megace today.   3.  Hyperglycemia: Improved.  Monitor closely as patient will be getting dexamethasone as one of his premedications. 4.  Pain: Patient does not complain of this today.  Patient was given a refill of his fentanyl patch and hydrocodone today. 5.  Constipation: Continue MiraLAX and have recommended a regular schedule rather than intermittent dosing.  Patient states he plans on taking an enema later today. 6.  Burping/indigestion: Improving.  Recommended OTC remedies. 7.  Hyponatremia: Resolved, monitor. 8.  Elevated liver enzymes: Nearly resolved, proceed with treatment as above. 9.  Anemia: Hemoglobin decreased, but stable.  Monitor. 10.  Fatigue: Multifactorial.  Patient was given a referral to the CARE program.   Patient expressed understanding and was in agreement with this plan. He also understands that He can call clinic at any time with any questions, concerns, or complaints.   Cancer Staging Adenocarcinoma of gallbladder [facility]) Staging form: Gallbladder, AJCC 8th Edition - Clinical stage from 03/03/2017: Stage IVB (cT3, cN0,  cM1) - Signed by Lloyd Huger, MD on 03/23/2017   Lloyd Huger, MD   05/18/2017 10:30 AM

## 2017-05-18 ENCOUNTER — Inpatient Hospital Stay: Payer: Medicare Other

## 2017-05-18 ENCOUNTER — Other Ambulatory Visit: Payer: Self-pay

## 2017-05-18 ENCOUNTER — Inpatient Hospital Stay (HOSPITAL_BASED_OUTPATIENT_CLINIC_OR_DEPARTMENT_OTHER): Payer: Medicare Other | Admitting: Oncology

## 2017-05-18 ENCOUNTER — Other Ambulatory Visit: Payer: Self-pay | Admitting: *Deleted

## 2017-05-18 VITALS — BP 133/72 | HR 73 | Temp 94.0°F | Resp 18 | Wt 183.8 lb

## 2017-05-18 DIAGNOSIS — C23 Malignant neoplasm of gallbladder: Secondary | ICD-10-CM | POA: Diagnosis not present

## 2017-05-18 DIAGNOSIS — H547 Unspecified visual loss: Secondary | ICD-10-CM | POA: Diagnosis not present

## 2017-05-18 DIAGNOSIS — F418 Other specified anxiety disorders: Secondary | ICD-10-CM

## 2017-05-18 DIAGNOSIS — E7800 Pure hypercholesterolemia, unspecified: Secondary | ICD-10-CM | POA: Diagnosis not present

## 2017-05-18 DIAGNOSIS — Z95828 Presence of other vascular implants and grafts: Secondary | ICD-10-CM

## 2017-05-18 DIAGNOSIS — K5900 Constipation, unspecified: Secondary | ICD-10-CM

## 2017-05-18 DIAGNOSIS — M545 Low back pain: Secondary | ICD-10-CM

## 2017-05-18 DIAGNOSIS — I10 Essential (primary) hypertension: Secondary | ICD-10-CM

## 2017-05-18 DIAGNOSIS — C787 Secondary malignant neoplasm of liver and intrahepatic bile duct: Secondary | ICD-10-CM

## 2017-05-18 DIAGNOSIS — K5090 Crohn's disease, unspecified, without complications: Secondary | ICD-10-CM | POA: Diagnosis not present

## 2017-05-18 DIAGNOSIS — R5383 Other fatigue: Secondary | ICD-10-CM

## 2017-05-18 DIAGNOSIS — R739 Hyperglycemia, unspecified: Secondary | ICD-10-CM | POA: Diagnosis not present

## 2017-05-18 DIAGNOSIS — R531 Weakness: Secondary | ICD-10-CM

## 2017-05-18 DIAGNOSIS — Z87442 Personal history of urinary calculi: Secondary | ICD-10-CM

## 2017-05-18 DIAGNOSIS — Z79899 Other long term (current) drug therapy: Secondary | ICD-10-CM | POA: Diagnosis not present

## 2017-05-18 DIAGNOSIS — G4730 Sleep apnea, unspecified: Secondary | ICD-10-CM | POA: Diagnosis not present

## 2017-05-18 DIAGNOSIS — Z5111 Encounter for antineoplastic chemotherapy: Secondary | ICD-10-CM

## 2017-05-18 DIAGNOSIS — R634 Abnormal weight loss: Secondary | ICD-10-CM

## 2017-05-18 DIAGNOSIS — R0602 Shortness of breath: Secondary | ICD-10-CM | POA: Diagnosis not present

## 2017-05-18 DIAGNOSIS — K30 Functional dyspepsia: Secondary | ICD-10-CM

## 2017-05-18 LAB — COMPREHENSIVE METABOLIC PANEL
ALT: 37 U/L (ref 17–63)
ANION GAP: 11 (ref 5–15)
AST: 48 U/L — ABNORMAL HIGH (ref 15–41)
Albumin: 3.5 g/dL (ref 3.5–5.0)
Alkaline Phosphatase: 596 U/L — ABNORMAL HIGH (ref 38–126)
BUN: 29 mg/dL — ABNORMAL HIGH (ref 6–20)
CHLORIDE: 105 mmol/L (ref 101–111)
CO2: 19 mmol/L — AB (ref 22–32)
Calcium: 8.6 mg/dL — ABNORMAL LOW (ref 8.9–10.3)
Creatinine, Ser: 0.95 mg/dL (ref 0.61–1.24)
GFR calc non Af Amer: >60 mL/min (ref >60)
Glucose, Bld: 123 mg/dL — ABNORMAL HIGH (ref 65–99)
POTASSIUM: 3.8 mmol/L (ref 3.5–5.1)
Sodium: 135 mmol/L (ref 135–145)
Total Bilirubin: 0.7 mg/dL (ref 0.3–1.2)
Total Protein: 7.2 g/dL (ref 6.5–8.1)

## 2017-05-18 LAB — CBC WITH DIFFERENTIAL/PLATELET
BASOS PCT: 1 %
Basophils Absolute: 0 10*3/uL (ref 0–0.1)
Eosinophils Absolute: 0 10*3/uL (ref 0–0.7)
Eosinophils Relative: 0 %
HEMATOCRIT: 26.2 % — AB (ref 40.0–52.0)
HEMOGLOBIN: 9.1 g/dL — AB (ref 13.0–18.0)
LYMPHS ABS: 0.8 10*3/uL — AB (ref 1.0–3.6)
Lymphocytes Relative: 30 %
MCH: 30.2 pg (ref 26.0–34.0)
MCHC: 34.7 g/dL (ref 32.0–36.0)
MCV: 87.3 fL (ref 80.0–100.0)
MONOS PCT: 12 %
Monocytes Absolute: 0.3 10*3/uL (ref 0.2–1.0)
NEUTROS ABS: 1.6 10*3/uL (ref 1.4–6.5)
Neutrophils Relative %: 57 %
Platelets: 101 10*3/uL — ABNORMAL LOW (ref 150–440)
RBC: 3.01 MIL/uL — ABNORMAL LOW (ref 4.40–5.90)
RDW: 16 % — ABNORMAL HIGH (ref 11.5–14.5)
WBC: 2.7 10*3/uL — ABNORMAL LOW (ref 3.8–10.6)

## 2017-05-18 MED ORDER — SODIUM CHLORIDE 0.9 % IV SOLN
2000.0000 mg | Freq: Once | INTRAVENOUS | Status: AC
  Administered 2017-05-18: 2000 mg via INTRAVENOUS
  Filled 2017-05-18: qty 52.6

## 2017-05-18 MED ORDER — HEPARIN SOD (PORK) LOCK FLUSH 100 UNIT/ML IV SOLN
500.0000 [IU] | Freq: Once | INTRAVENOUS | Status: AC
  Administered 2017-05-18: 500 [IU] via INTRAVENOUS

## 2017-05-18 MED ORDER — HYDROCODONE-ACETAMINOPHEN 5-325 MG PO TABS: 1.0000 | ORAL_TABLET | ORAL | 0 refills | Status: DC | PRN

## 2017-05-18 MED ORDER — MEGESTROL ACETATE 40 MG PO TABS: 40.0000 mg | ORAL_TABLET | Freq: Every day | ORAL | 2 refills | Status: DC

## 2017-05-18 MED ORDER — SODIUM CHLORIDE 0.9 % IV SOLN
Freq: Once | INTRAVENOUS | Status: AC
  Administered 2017-05-18: 10:00:00 via INTRAVENOUS
  Filled 2017-05-18: qty 1000

## 2017-05-18 MED ORDER — HEPARIN SOD (PORK) LOCK FLUSH 100 UNIT/ML IV SOLN
500.0000 [IU] | Freq: Once | INTRAVENOUS | Status: DC | PRN
  Filled 2017-05-18: qty 5

## 2017-05-18 MED ORDER — SODIUM CHLORIDE 0.9% FLUSH
10.0000 mL | Freq: Once | INTRAVENOUS | Status: AC
  Administered 2017-05-18: 10 mL via INTRAVENOUS
  Filled 2017-05-18: qty 10

## 2017-05-18 MED ORDER — PROCHLORPERAZINE MALEATE 10 MG PO TABS
10.0000 mg | ORAL_TABLET | Freq: Once | ORAL | Status: AC
  Administered 2017-05-18: 10 mg via ORAL

## 2017-05-18 MED ORDER — FENTANYL 25 MCG/HR TD PT72: 25.0000 ug | MEDICATED_PATCH | TRANSDERMAL | 0 refills | Status: DC

## 2017-05-18 MED ORDER — SODIUM CHLORIDE 0.9% FLUSH
10.0000 mL | Freq: Once | INTRAVENOUS | Status: AC
Start: 2017-05-18 — End: 2017-05-18
  Administered 2017-05-18: 10 mL via INTRAVENOUS
  Filled 2017-05-18: qty 10

## 2017-05-18 NOTE — Progress Notes (Signed)
Here  For follow up. No appetite and not taking megace .

## 2017-05-20 DIAGNOSIS — G4733 Obstructive sleep apnea (adult) (pediatric): Secondary | ICD-10-CM | POA: Diagnosis not present

## 2017-05-27 ENCOUNTER — Inpatient Hospital Stay: Payer: Medicare Other

## 2017-05-27 ENCOUNTER — Telehealth: Payer: Self-pay

## 2017-05-27 NOTE — Progress Notes (Signed)
Nutrition Follow-up:  Patient with adenocarcinoma of gallbladder receiving chemotherapy.    Met with patient today in clinic as his request.  Patient coming from CARE program (exercise).  Reports that his appetite is up and down.  Ate real well yesterday 2 eggs, grits, ham, toast for breakfast, hamburger with vegetables for lunch and steak with mashed potatoes and soup for dinner.  Reports taste buds have improved.  Reports biggest problem is constipation and diarrhea. Planning to GI doctor in July (hx of Crohns disease).  Reports drinking 2 ensure per day.    Medications: megace (started taking again per pt)  Labs: reviewed  Anthropometrics:   Weight decreased to 183 lb on 4/16 from 196 lb on 2/28.    7% weight loss in the last 2 months, signficant   NUTRITION DIAGNOSIS: Malnutrition continues   MALNUTRITION DIAGNOSIS: Severe malnutrition continues   INTERVENTION:   Encouraged patient to continue to take appetite stimulant.  Reports had stopped taking it for awhile. Encouraged patient to drink 3 high calorie, high protein shakes daily to increase calories. Reviewed calorie goals with patient and foods that are high in calories for patient to eat. Patient given 2nd case of ensure plus today.     MONITORING, EVALUATION, GOAL: weight trends, intake   NEXT VISIT: phone follow-up as pt treatment days on days when RD not in clinic  Jla Reynolds B. Zenia Resides, [place], [place] Registered Dietitian [phone] (pager)

## 2017-05-27 NOTE — Telephone Encounter (Signed)
Nutrition  Received message from patient requesting nutrition appointment today.  Called patient back and left message letting him know that RD could see him today and what times RD would be available.    Thomas David, [place], [place] Registered Dietitian [phone] (pager)

## 2017-06-03 ENCOUNTER — Ambulatory Visit (INDEPENDENT_AMBULATORY_CARE_PROVIDER_SITE_OTHER): Payer: Medicare Other | Admitting: Family Medicine

## 2017-06-03 ENCOUNTER — Encounter: Payer: Self-pay | Admitting: Family Medicine

## 2017-06-03 VITALS — BP 120/62 | HR 80 | Temp 98.1°F | Ht 69.0 in | Wt 184.8 lb

## 2017-06-03 DIAGNOSIS — R0609 Other forms of dyspnea: Secondary | ICD-10-CM | POA: Diagnosis not present

## 2017-06-03 DIAGNOSIS — M109 Gout, unspecified: Secondary | ICD-10-CM

## 2017-06-03 DIAGNOSIS — C23 Malignant neoplasm of gallbladder: Secondary | ICD-10-CM

## 2017-06-03 DIAGNOSIS — I10 Essential (primary) hypertension: Secondary | ICD-10-CM

## 2017-06-03 DIAGNOSIS — K5900 Constipation, unspecified: Secondary | ICD-10-CM | POA: Diagnosis not present

## 2017-06-03 MED ORDER — ONDANSETRON HCL 8 MG PO TABS: 8.0000 mg | ORAL_TABLET | Freq: Two times a day (BID) | ORAL | 3 refills | Status: DC

## 2017-06-03 NOTE — Assessment & Plan Note (Signed)
Adequately controlled.  Continue current regimen. 

## 2017-06-03 NOTE — Patient Instructions (Signed)
Nice to see you. Please continue to see neurology. If your constipation goes the wrong direction with becoming too loose you can discontinue the stool softener. If you develop persistent shortness of breath or any new or changing symptoms please seek medical attention immediately.

## 2017-06-03 NOTE — Assessment & Plan Note (Addendum)
Potentially related to anemia versus his chemotherapy though could also be cardiac in nature.  His lungs are clear and his oxygenation is good.  EKG performed today and is reassuring.  I discussed several options with him including continuing to monitor as it may be related to anemia and his chemotherapy versus getting an echo completed versus referring to cardiology.  The patient opted to monitor for now given that it coincided with onset of his chemotherapy.  He noted if it persists once he finishes treatment he would consider further evaluation.  Return precautions given.

## 2017-06-03 NOTE — Progress Notes (Signed)
Tommi Rumps, MD Phone: [phone]  Thomas David is a 82 y.o. male who presents today for f/u.  Adenocarcinoma of the gallbladder: Stage IV.  He is on palliative chemo.  He has several more treatments to undergo and then they will re-eval his disease status.  He is on Megace for appetite and that has helped some.  No abdominal pain.  He was depressed though not anymore.  He has been on Zofran which has helped with his burping and nausea.  He reports he was depressed though is not any longer.  He has had some constipation related to his pain medications.  Last week or 2 have been better.  He takes MiraLAX and a stool softener and has been adding prune juice.  He will have a bowel movement every 2 to 3 days.  If he goes longer than that he will strain.  Occasional suppository use.  No blood in stool.  He does report some dyspnea on exertion that has been going on for a number of weeks up to a month.  He discussed it with oncology and it looks like they felt it may have been related to his anemia.  He notes he will lose breath when he walks at times.  Typically occurs most days.  No orthopnea or PND. No chest pain.  He has taken all of his blood pressure medications.  His dyspnea has been stable over the past several weeks.  Social History   Tobacco Use  Smoking Status Never Smoker  Smokeless Tobacco Never Used     ROS see history of present illness  Objective  Physical Exam Vitals:   06/03/17 0801  BP: 120/62  Pulse: 80  Temp: 98.1 F (36.7 C)  SpO2: 99%    BP Readings from Last 3 Encounters:  06/03/17 120/62  05/18/17 133/72  05/11/17 112/75   Wt Readings from Last 3 Encounters:  06/03/17 184 lb 12.8 oz (83.8 kg)  05/18/17 183 lb 12.8 oz (83.4 kg)  05/11/17 185 lb 1.6 oz (84 kg)    Physical Exam  Constitutional: No distress.  Cardiovascular: Normal rate, regular rhythm and normal heart sounds.  Pulmonary/Chest: Effort normal and breath sounds normal.    Musculoskeletal: He exhibits no edema.  Neurological: He is alert.  Skin: Skin is warm and dry. He is not diaphoretic.   EKG: Normal sinus rhythm, rate 77, low voltage, no ischemic changes noted  Assessment/Plan: Please see individual problem list.  Adenocarcinoma of gallbladder [facility]) He will continue to follow with oncology.  Essential hypertension Adequately controlled.  Continue current regimen.  Constipation Improving with combination of MiraLAX, stool softener, improving juice.  Discussed if he goes too far he could discontinue the stool softener.  Gout He is on allopurinol.  He reports no recent gout flares.  I did discuss that sometimes with chemotherapeutic agents could increase release of uric acid and result in a gout flare.  He will continue allopurinol for now.  Dyspnea on exertion Potentially related to anemia versus his chemotherapy though could also be cardiac in nature.  His lungs are clear and his oxygenation is good.  EKG performed today and is reassuring.  I discussed several options with him including continuing to monitor as it may be related to anemia and his chemotherapy versus getting an echo completed versus referring to cardiology.  The patient opted to monitor for now given that it coincided with onset of his chemotherapy.  He noted if it persists once he finishes treatment  he would consider further evaluation.  Return precautions given.   Orders Placed This Encounter  Procedures  . EKG 12-Lead    Meds ordered this encounter  Medications  . ondansetron (ZOFRAN) 8 MG tablet    Sig: Take 1 tablet (8 mg total) by mouth 2 (two) times daily.    Dispense:  30 tablet    Refill:  [place], MD [place]

## 2017-06-03 NOTE — Assessment & Plan Note (Signed)
He is on allopurinol.  He reports no recent gout flares.  I did discuss that sometimes with chemotherapeutic agents could increase release of uric acid and result in a gout flare.  He will continue allopurinol for now.

## 2017-06-03 NOTE — Assessment & Plan Note (Signed)
Improving with combination of MiraLAX, stool softener, improving juice.  Discussed if he goes too far he could discontinue the stool softener.

## 2017-06-03 NOTE — Assessment & Plan Note (Signed)
He will continue to follow with oncology.

## 2017-06-04 NOTE — Progress Notes (Signed)
[place]  [phone] Fax:(336) [phone]  ID: Thomas David OB: 1935/08/04  MR#: [id number]  DVV#:[id number]  Patient Care Team: Leone Haven, MD as PCP - General (Family Medicine) Kathyrn Drown, MD (Family Medicine) Clent Jacks, RN as Registered Nurse  CHIEF COMPLAINT: Adenocarcinoma of gallbladder.  INTERVAL HISTORY: Patient returns to clinic today for further evaluation and consideration of cycle 3, day 1 of cisplatin and gemcitabine today.  He feels significantly improved after having an extra week off.  Has improved after initiating Dulcolax.  He has an improved appetite.  He has no neurologic complaints.  He denies any recent fevers or illnesses. He denies any chest pain or shortness of breath.  He does not complain of abdominal pain today. He denies any nausea, vomiting, or diarrhea.  He has no urinary complaints.  Patient offers no specific complaints today.    REVIEW OF SYSTEMS:   Review of Systems  Constitutional: Negative.  Negative for fever, malaise/fatigue and weight loss.  Eyes: Negative for blurred vision.  Respiratory: Negative.  Negative for cough and shortness of breath.   Cardiovascular: Negative.  Negative for chest pain and leg swelling.  Gastrointestinal: Negative for abdominal pain, blood in stool, constipation, heartburn, melena and nausea.  Genitourinary: Negative.  Negative for dysuria and frequency.  Musculoskeletal: Negative.  Negative for falls.  Skin: Negative.  Negative for rash.  Neurological: Negative.  Negative for sensory change, focal weakness and weakness.  Psychiatric/Behavioral: Negative.  The patient is not nervous/anxious.     As per HPI. Otherwise, a complete review of systems is negative.  PAST MEDICAL HISTORY: Past Medical History:  Diagnosis Date  . Anxiety   . Arthritis   . Chronic lower back pain   . Crohn's disease ([place])   . Depression   . Difficult intubation    patient denies on  02/12/17  . GERD (gastroesophageal reflux disease)   . Gout   . History of kidney stones 40 years ago  . Hypercholesteremia   . Hypertension   . Prediabetes   . Sleep apnea    cpap     PAST SURGICAL HISTORY: Past Surgical History:  Procedure Laterality Date  . CHOLECYSTECTOMY N/A 11/03/2016   Procedure: LAPAROSCOPIC CHOLECYSTostomy with tube placement ;  Surgeon: Johnathan Hausen, MD;  Location: [facility];  Service: General;  Laterality: N/A;  . CHOLECYSTECTOMY N/A 02/15/2017   Procedure: LAPAROSCOPIC SUBTOTAL CHOLECYSTECTOMY, BIOPSY OF GALLBLADDER;  Surgeon: Johnathan Hausen, MD;  Location: [facility];  Service: General;  Laterality: N/A;  . COLECTOMY  1973   "part of large intestines" (05/04/2012)  . COLON RESECTION    . COLONOSCOPY    . COLONOSCOPY N/A 05/23/2014   Procedure: COLONOSCOPY;  Surgeon: Rogene Houston, MD;  [facility];  Service: Endoscopy;  Laterality: N/A;  1200  . ESOPHAGEAL DILATION N/A 05/23/2014   Procedure: ESOPHAGEAL DILATION;  Surgeon: Rogene Houston, MD;  [facility];  Service: Endoscopy;  Laterality: N/A;  . ESOPHAGOGASTRODUODENOSCOPY N/A 03/16/2014   Procedure: esophageal dilation ;  Surgeon: Rogene Houston, MD;  [facility];  Service: Endoscopy;  Laterality: N/A;  . ESOPHAGOGASTRODUODENOSCOPY N/A 05/23/2014   Procedure: ESOPHAGOGASTRODUODENOSCOPY (EGD);  Surgeon: Rogene Houston, MD;  [facility];  Service: Endoscopy;  Laterality: N/A;  . ESOPHAGOGASTRODUODENOSCOPY (EGD) WITH PROPOFOL N/A 02/18/2017   Procedure: ESOPHAGOGASTRODUODENOSCOPY (EGD) WITH PROPOFOL;  Surgeon: Wonda Horner, MD;  [facility];  Service: Endoscopy;  Laterality: N/A;  .  [place]  . IR CHOLANGIOGRAM EXISTING TUBE  11/26/2016  . KIDNEY STONE SURGERY  1970's  . PARTIAL COLECTOMY  ~ 40 years ago   some of large and small  . PORTA CATH INSERTION N/A 03/17/2017   Procedure: PORTA CATH INSERTION;  Surgeon: Katha Cabal,  MD;  Location: [facility];  Service: Cardiovascular;  Laterality: N/A;  . PROSTATE SURGERY    . THYROIDECTOMY Right 05/04/2012   Procedure: RIGHT HEMI THYROIDECTOMY;  Surgeon: Ascencion Dike, MD;  Location: [facility];  Service: ENT;  Laterality: Right;  . THYROIDECTOMY    . TONSILLECTOMY AND ADENOIDECTOMY     as a child  . TRANSURETHRAL PROSTATECTOMY WITH GYRUS INSTRUMENTS N/A 07/11/2012   Procedure: TRANSURETHRAL PROSTATECTOMY WITH GYRUS INSTRUMENTS;  Surgeon: Claybon Jabs, MD;  [facility];  Service: Urology;  Laterality: N/A;  . UPPER GASTROINTESTINAL ENDOSCOPY      FAMILY HISTORY: Family History  Problem Relation Age of Onset  . Diabetes Mother   . Stroke Father   . Healthy Daughter   . Healthy Son   . Heart disease Sister     ADVANCED DIRECTIVES (Y/N):  N  HEALTH MAINTENANCE: Social History   Tobacco Use  . Smoking status: Never Smoker  . Smokeless tobacco: Never Used  Substance Use Topics  . Alcohol use: No    Alcohol/week: 0.0 oz  . Drug use: No     Colonoscopy:  PAP:  Bone density:  Lipid panel:  No Known Allergies  Current Outpatient Medications  Medication Sig Dispense Refill  . acetaminophen (TYLENOL) 500 MG tablet Take 1,000 mg by mouth every 4 (four) hours as needed for moderate pain or headache.     . allopurinol (ZYLOPRIM) 300 MG tablet Take 1 tablet (300 mg total) by mouth every morning. 90 tablet 3  . amLODipine (NORVASC) 5 MG tablet TAKE 1 TABLET BY MOUTH  DAILY 90 tablet 3  . aspirin EC 81 MG tablet Take 81 mg by mouth at bedtime.    . bisacodyl (DULCOLAX) 10 MG suppository Place 1 suppository (10 mg total) rectally daily as needed for moderate constipation. 12 suppository 0  . fentaNYL (DURAGESIC - DOSED MCG/HR) 25 MCG/HR patch Place 1 patch (25 mcg total) onto the skin every 3 (three) days. 10 patch 0  . fluticasone (FLONASE) 50 MCG/ACT nasal spray USE 1 SPRAY INTO BOTH  NOSTRILS DAILY 32 g 0  . Histamine Dihydrochloride (AUSTRALIAN DREAM  ARTHRITIS EX) Apply 1 application topically 2 (two) times daily as needed (back pain).     Marland Kitchen HYDROcodone-acetaminophen (NORCO/VICODIN) 5-325 MG tablet Take 1-2 tablets by mouth every 4 (four) hours as needed for moderate pain. 30 tablet 0  . lidocaine-prilocaine (EMLA) cream Apply to port 1 hours prior to chemotherapy appointment. Cover with plastic wrap. 30 g 3  . losartan (COZAAR) 100 MG tablet Take 1 tablet (100 mg total) by mouth daily. 90 tablet 3  . megestrol (MEGACE) 40 MG tablet Take 1 tablet (40 mg total) by mouth daily. 30 tablet 2  . mesalamine (PENTASA) 500 MG CR capsule Take 1 capsule (500 mg total) by mouth 4 (four) times daily. (Patient taking differently: Take 1,000 mg by mouth 2 (two) times daily. ) 360 capsule 3  . metoprolol tartrate (LOPRESSOR) 50 MG tablet Take 1 tablet (50 mg total) by mouth 2 (two) times daily. 180 tablet 3  . Multiple Vitamin (MULTIVITAMIN WITH MINERALS) TABS tablet Take 2 tablets by mouth daily.    Marland Kitchen  ondansetron (ZOFRAN) 8 MG tablet Take 1 tablet (8 mg total) by mouth 2 (two) times daily. 30 tablet 3  . oxybutynin (DITROPAN-XL) 5 MG 24 hr tablet Take 1 tablet (5 mg total) by mouth at bedtime. 90 tablet 1  . simvastatin (ZOCOR) 20 MG tablet Take 1 tablet (20 mg total) by mouth daily. 90 tablet 3  . vitamin B-12 (CYANOCOBALAMIN) 1000 MCG tablet Take 1,000 mcg by mouth 2 (two) times daily.    . pantoprazole (PROTONIX) 40 MG tablet Take 1 tablet (40 mg total) by mouth daily. 30 tablet 1   No current facility-administered medications for this visit.     OBJECTIVE: Vitals:   06/08/17 0909  BP: 113/72  Pulse: 82  Resp: 18  Temp: (!) 97.2 F (36.2 C)     Body mass index is 27.5 kg/m.    ECOG FS:1 - Symptomatic but completely ambulatory  General: Well-developed, well-nourished, no acute distress. Eyes: Pink conjunctiva, anicteric sclera. Lungs: Clear to auscultation bilaterally. Heart: Regular rate and rhythm. No rubs, murmurs, or gallops. Abdomen:  Soft, nontender, nondistended. No organomegaly noted, normoactive bowel sounds. Musculoskeletal: No edema, cyanosis, or clubbing. Neuro: Alert, answering all questions appropriately. Cranial nerves grossly intact. Skin: No rashes or petechiae noted. Psych: Normal affect.  LAB RESULTS:  Lab Results  Component Value Date   NA 135 06/08/2017   K 4.2 06/08/2017   CL 109 06/08/2017   CO2 17 (L) 06/08/2017   GLUCOSE 134 (H) 06/08/2017   BUN 31 (H) 06/08/2017   CREATININE 0.91 06/08/2017   CALCIUM 9.0 06/08/2017   PROT 7.3 06/08/2017   ALBUMIN 3.4 (L) 06/08/2017   AST 39 06/08/2017   ALT 30 06/08/2017   ALKPHOS 376 (H) 06/08/2017   BILITOT 0.4 06/08/2017   GFRNONAA >60 06/08/2017   GFRAA >60 06/08/2017    Lab Results  Component Value Date   WBC 8.4 06/08/2017   NEUTROABS 5.2 06/08/2017   HGB 9.6 (L) 06/08/2017   HCT 27.5 (L) 06/08/2017   MCV 92.9 06/08/2017   PLT 452 (H) 06/08/2017     STUDIES: No results found.  ASSESSMENT: Adenocarcinoma of gallbladder.  PLAN:    1. Adenocarcinoma of gallbladder: PET scan results from March 19, 2017 reviewed independently confirming stage IV disease with omental and liver metastasis.  Continue with palliative chemotherapy using gemcitabine and cisplatin on day 1 with gemcitabine only on days 8 and 15.  This will be a 28-day cycle.  Cisplatin has been dose reduced to 40 mg/m given his difficulties with cycle 1.  Proceed with cycle 3, day 1 today.  Return to clinic in 1 week for further evaluation and consideration of cycle 3, day 8 which is gemcitabine only.  Patient's PET scan has been scheduled for July 06, 2017.   2.  Poor appetite: Improved.  Appreciate dietary input.  Continue Megace as prescribed. 3.  Hyperglycemia: Increased, but stable.  Monitor closely as patient will be getting dexamethasone as one of his premedications. 4.  Pain: Chronic and unchanged.  Continue fentanyl patch and hydrocodone as prescribed. 5.  Constipation:  Continue Dulcolax as needed. 6.  Burping/indigestion: Improving.  Continue OTC treatment. 7.  Hyponatremia: Resolved, monitor. 8.  Elevated liver enzymes: Resolved. 9.  Anemia: Hemoglobin decreased, but stable.  Monitor. 10.  Fatigue: Multifactorial.  Patient was previously given a referral to the CARE program.  Approximately 30 minutes spent in discussion of which greater than 50% was consultation.   Patient expressed understanding and was in agreement  with this plan. He also understands that He can call clinic at any time with any questions, concerns, or complaints.   Cancer Staging Adenocarcinoma of gallbladder [facility]) Staging form: Gallbladder, AJCC 8th Edition - Clinical stage from 03/03/2017: Stage IVB (cT3, cN0, cM1) - Signed by Lloyd Huger, MD on 03/23/2017   Lloyd Huger, MD   06/12/2017 8:13 AM

## 2017-06-07 ENCOUNTER — Other Ambulatory Visit: Payer: Self-pay | Admitting: Oncology

## 2017-06-08 ENCOUNTER — Ambulatory Visit: Payer: Medicare Other

## 2017-06-08 ENCOUNTER — Encounter: Payer: Self-pay | Admitting: Oncology

## 2017-06-08 ENCOUNTER — Inpatient Hospital Stay (HOSPITAL_BASED_OUTPATIENT_CLINIC_OR_DEPARTMENT_OTHER): Payer: Medicare Other | Admitting: Oncology

## 2017-06-08 ENCOUNTER — Inpatient Hospital Stay: Payer: Medicare Other | Attending: Oncology

## 2017-06-08 ENCOUNTER — Inpatient Hospital Stay: Payer: Medicare Other

## 2017-06-08 ENCOUNTER — Telehealth: Payer: Self-pay | Admitting: Family Medicine

## 2017-06-08 VITALS — BP 113/72 | HR 82 | Temp 97.2°F | Resp 18 | Wt 186.2 lb

## 2017-06-08 DIAGNOSIS — I10 Essential (primary) hypertension: Secondary | ICD-10-CM | POA: Insufficient documentation

## 2017-06-08 DIAGNOSIS — D509 Iron deficiency anemia, unspecified: Secondary | ICD-10-CM

## 2017-06-08 DIAGNOSIS — Z95828 Presence of other vascular implants and grafts: Secondary | ICD-10-CM

## 2017-06-08 DIAGNOSIS — K219 Gastro-esophageal reflux disease without esophagitis: Secondary | ICD-10-CM

## 2017-06-08 DIAGNOSIS — K5900 Constipation, unspecified: Secondary | ICD-10-CM

## 2017-06-08 DIAGNOSIS — R0602 Shortness of breath: Secondary | ICD-10-CM | POA: Diagnosis not present

## 2017-06-08 DIAGNOSIS — G4730 Sleep apnea, unspecified: Secondary | ICD-10-CM

## 2017-06-08 DIAGNOSIS — R739 Hyperglycemia, unspecified: Secondary | ICD-10-CM | POA: Insufficient documentation

## 2017-06-08 DIAGNOSIS — R630 Anorexia: Secondary | ICD-10-CM

## 2017-06-08 DIAGNOSIS — R000 Tachycardia, unspecified: Secondary | ICD-10-CM

## 2017-06-08 DIAGNOSIS — R5383 Other fatigue: Secondary | ICD-10-CM | POA: Diagnosis not present

## 2017-06-08 DIAGNOSIS — C787 Secondary malignant neoplasm of liver and intrahepatic bile duct: Secondary | ICD-10-CM | POA: Insufficient documentation

## 2017-06-08 DIAGNOSIS — E7800 Pure hypercholesterolemia, unspecified: Secondary | ICD-10-CM | POA: Diagnosis not present

## 2017-06-08 DIAGNOSIS — C786 Secondary malignant neoplasm of retroperitoneum and peritoneum: Secondary | ICD-10-CM | POA: Diagnosis not present

## 2017-06-08 DIAGNOSIS — M545 Low back pain: Secondary | ICD-10-CM

## 2017-06-08 DIAGNOSIS — K30 Functional dyspepsia: Secondary | ICD-10-CM | POA: Insufficient documentation

## 2017-06-08 DIAGNOSIS — Z7982 Long term (current) use of aspirin: Secondary | ICD-10-CM | POA: Insufficient documentation

## 2017-06-08 DIAGNOSIS — E871 Hypo-osmolality and hyponatremia: Secondary | ICD-10-CM

## 2017-06-08 DIAGNOSIS — K5090 Crohn's disease, unspecified, without complications: Secondary | ICD-10-CM

## 2017-06-08 DIAGNOSIS — M129 Arthropathy, unspecified: Secondary | ICD-10-CM | POA: Insufficient documentation

## 2017-06-08 DIAGNOSIS — M109 Gout, unspecified: Secondary | ICD-10-CM | POA: Diagnosis not present

## 2017-06-08 DIAGNOSIS — D696 Thrombocytopenia, unspecified: Secondary | ICD-10-CM | POA: Insufficient documentation

## 2017-06-08 DIAGNOSIS — Z79899 Other long term (current) drug therapy: Secondary | ICD-10-CM

## 2017-06-08 DIAGNOSIS — Z5111 Encounter for antineoplastic chemotherapy: Secondary | ICD-10-CM | POA: Diagnosis not present

## 2017-06-08 DIAGNOSIS — G8929 Other chronic pain: Secondary | ICD-10-CM | POA: Diagnosis not present

## 2017-06-08 DIAGNOSIS — C23 Malignant neoplasm of gallbladder: Secondary | ICD-10-CM

## 2017-06-08 DIAGNOSIS — F418 Other specified anxiety disorders: Secondary | ICD-10-CM | POA: Diagnosis not present

## 2017-06-08 DIAGNOSIS — T451X5A Adverse effect of antineoplastic and immunosuppressive drugs, initial encounter: Secondary | ICD-10-CM | POA: Insufficient documentation

## 2017-06-08 DIAGNOSIS — D6481 Anemia due to antineoplastic chemotherapy: Secondary | ICD-10-CM | POA: Insufficient documentation

## 2017-06-08 DIAGNOSIS — Z87442 Personal history of urinary calculi: Secondary | ICD-10-CM | POA: Insufficient documentation

## 2017-06-08 LAB — CBC WITH DIFFERENTIAL/PLATELET
Basophils Absolute: 0.1 10*3/uL (ref 0–0.1)
Basophils Relative: 1 %
Eosinophils Absolute: 0.1 10*3/uL (ref 0–0.7)
Eosinophils Relative: 1 %
HEMATOCRIT: 27.5 % — AB (ref 40.0–52.0)
HEMOGLOBIN: 9.6 g/dL — AB (ref 13.0–18.0)
LYMPHS ABS: 1.2 10*3/uL (ref 1.0–3.6)
Lymphocytes Relative: 15 %
MCH: 32.4 pg (ref 26.0–34.0)
MCHC: 34.8 g/dL (ref 32.0–36.0)
MCV: 92.9 fL (ref 80.0–100.0)
Monocytes Absolute: 1.8 10*3/uL — ABNORMAL HIGH (ref 0.2–1.0)
Monocytes Relative: 21 %
NEUTROS ABS: 5.2 10*3/uL (ref 1.4–6.5)
NEUTROS PCT: 62 %
Platelets: 452 10*3/uL — ABNORMAL HIGH (ref 150–440)
RBC: 2.96 MIL/uL — AB (ref 4.40–5.90)
RDW: 26.2 % — ABNORMAL HIGH (ref 11.5–14.5)
WBC: 8.4 10*3/uL (ref 3.8–10.6)

## 2017-06-08 LAB — COMPREHENSIVE METABOLIC PANEL
ALT: 30 U/L (ref 17–63)
AST: 39 U/L (ref 15–41)
Albumin: 3.4 g/dL — ABNORMAL LOW (ref 3.5–5.0)
Alkaline Phosphatase: 376 U/L — ABNORMAL HIGH (ref 38–126)
Anion gap: 9 (ref 5–15)
BUN: 31 mg/dL — AB (ref 6–20)
CO2: 17 mmol/L — ABNORMAL LOW (ref 22–32)
Calcium: 9 mg/dL (ref 8.9–10.3)
Chloride: 109 mmol/L (ref 101–111)
Creatinine, Ser: 0.91 mg/dL (ref 0.61–1.24)
GFR calc Af Amer: >60 mL/min (ref >60)
Glucose, Bld: 134 mg/dL — ABNORMAL HIGH (ref 65–99)
Potassium: 4.2 mmol/L (ref 3.5–5.1)
Sodium: 135 mmol/L (ref 135–145)
Total Bilirubin: 0.4 mg/dL (ref 0.3–1.2)
Total Protein: 7.3 g/dL (ref 6.5–8.1)

## 2017-06-08 MED ORDER — SODIUM CHLORIDE 0.9 % IV SOLN
Freq: Once | INTRAVENOUS | Status: AC
  Administered 2017-06-08: 10:00:00 via INTRAVENOUS
  Filled 2017-06-08: qty 1000

## 2017-06-08 MED ORDER — SODIUM CHLORIDE 0.9 % IV SOLN
40.0000 mg/m2 | Freq: Once | INTRAVENOUS | Status: AC
  Administered 2017-06-08: 84 mg via INTRAVENOUS
  Filled 2017-06-08: qty 84

## 2017-06-08 MED ORDER — PALONOSETRON HCL INJECTION 0.25 MG/5ML
0.2500 mg | Freq: Once | INTRAVENOUS | Status: AC
  Administered 2017-06-08: 0.25 mg via INTRAVENOUS
  Filled 2017-06-08: qty 5

## 2017-06-08 MED ORDER — SODIUM CHLORIDE 0.9% FLUSH
10.0000 mL | INTRAVENOUS | Status: DC | PRN
  Administered 2017-06-08: 10 mL via INTRAVENOUS
  Filled 2017-06-08: qty 10

## 2017-06-08 MED ORDER — SODIUM CHLORIDE 0.9 % IV SOLN
2000.0000 mg | Freq: Once | INTRAVENOUS | Status: AC
  Administered 2017-06-08: 2000 mg via INTRAVENOUS
  Filled 2017-06-08: qty 52.6

## 2017-06-08 MED ORDER — SODIUM CHLORIDE 0.9 % IV SOLN
Freq: Once | INTRAVENOUS | Status: AC
  Administered 2017-06-08: 12:00:00 via INTRAVENOUS
  Filled 2017-06-08: qty 5

## 2017-06-08 MED ORDER — PANTOPRAZOLE SODIUM 40 MG PO TBEC: 40.0000 mg | DELAYED_RELEASE_TABLET | Freq: Every day | ORAL | 1 refills | Status: DC

## 2017-06-08 MED ORDER — HEPARIN SOD (PORK) LOCK FLUSH 100 UNIT/ML IV SOLN
500.0000 [IU] | Freq: Once | INTRAVENOUS | Status: AC
  Administered 2017-06-08: 500 [IU] via INTRAVENOUS
  Filled 2017-06-08: qty 5

## 2017-06-08 MED ORDER — POTASSIUM CHLORIDE 2 MEQ/ML IV SOLN
Freq: Once | INTRAVENOUS | Status: AC
  Administered 2017-06-08: 10:00:00 via INTRAVENOUS
  Filled 2017-06-08: qty 1000

## 2017-06-08 NOTE — Progress Notes (Signed)
Pt in for follow up and treatment today.  States "feeling much better and appetite has improved".  Taking megace and using dulcolax for constipation.

## 2017-06-08 NOTE — Telephone Encounter (Signed)
Copied from [place] [id number]. Topic: Quick Communication - Rx Refill/Question >> Jun 08, 2017  8:39 AM Synthia Innocent wrote: Medication: pantoprazole (PROTONIX) 40 MG tablet  Has the patient contacted their pharmacy? Yes.   (Agent: If no, request that the patient contact the pharmacy for the refill.) Preferred Pharmacy (with phone number or street name): [facility] Agent: Please be advised that RX refills may take up to 3 business days. We ask that you follow-up with your pharmacy.

## 2017-06-13 NOTE — Progress Notes (Signed)
[place]  Telephone:(336) [phone] Fax:(336) [phone]  ID: Thomas David OB: 1935-05-10  MR#: [id number]  VOZ#:[id number]  Patient Care Team: Leone Haven, MD as PCP - General (Family Medicine) Kathyrn Drown, MD (Family Medicine) Clent Jacks, RN as Registered Nurse  CHIEF COMPLAINT: Adenocarcinoma of gallbladder.  INTERVAL HISTORY: Patient returns to clinic today for further evaluation and consideration of cycle 3, day 8 of cisplatin and gemcitabine.  Gemcitabine only.  He continues to have constipation, but otherwise feels well.  He has an improved appetite.  He has no neurologic complaints.  He denies any recent fevers or illnesses. He denies any chest pain or shortness of breath.  He does not complain of abdominal pain today. He denies any nausea, vomiting, or diarrhea.  He has no urinary complaints.  Patient offers no further specific complaints today.  REVIEW OF SYSTEMS:   Review of Systems  Constitutional: Negative.  Negative for fever, malaise/fatigue and weight loss.  Eyes: Negative for blurred vision.  Respiratory: Negative.  Negative for cough and shortness of breath.   Cardiovascular: Negative.  Negative for chest pain and leg swelling.  Gastrointestinal: Positive for constipation. Negative for abdominal pain, blood in stool, heartburn, melena and nausea.  Genitourinary: Negative.  Negative for dysuria and frequency.  Musculoskeletal: Negative.  Negative for falls.  Skin: Negative.  Negative for rash.  Neurological: Negative.  Negative for sensory change, focal weakness and weakness.  Psychiatric/Behavioral: Negative.  The patient is not nervous/anxious.     As per HPI. Otherwise, a complete review of systems is negative.  PAST MEDICAL HISTORY: Past Medical History:  Diagnosis Date  . Anxiety   . Arthritis   . Chronic lower back pain   . Crohn's disease ([place])   . Depression   . Difficult intubation    patient denies on 02/12/17  .  GERD (gastroesophageal reflux disease)   . Gout   . History of kidney stones 40 years ago  . Hypercholesteremia   . Hypertension   . Prediabetes   . Sleep apnea    cpap     PAST SURGICAL HISTORY: Past Surgical History:  Procedure Laterality Date  . CHOLECYSTECTOMY N/A 11/03/2016   Procedure: LAPAROSCOPIC CHOLECYSTostomy with tube placement ;  Surgeon: Johnathan Hausen, MD;  Location: [facility];  Service: General;  Laterality: N/A;  . CHOLECYSTECTOMY N/A 02/15/2017   Procedure: LAPAROSCOPIC SUBTOTAL CHOLECYSTECTOMY, BIOPSY OF GALLBLADDER;  Surgeon: Johnathan Hausen, MD;  [facility];  Service: General;  Laterality: N/A;  . COLECTOMY  1973   "part of large intestines" (05/04/2012)  . COLON RESECTION    . COLONOSCOPY    . COLONOSCOPY N/A 05/23/2014   Procedure: COLONOSCOPY;  Surgeon: Rogene Houston, MD;  [facility];  Service: Endoscopy;  Laterality: N/A;  1200  . ESOPHAGEAL DILATION N/A 05/23/2014   Procedure: ESOPHAGEAL DILATION;  Surgeon: Rogene Houston, MD;  [facility];  Service: Endoscopy;  Laterality: N/A;  . ESOPHAGOGASTRODUODENOSCOPY N/A 03/16/2014   Procedure: esophageal dilation ;  Surgeon: Rogene Houston, MD;  [facility];  Service: Endoscopy;  Laterality: N/A;  . ESOPHAGOGASTRODUODENOSCOPY N/A 05/23/2014   Procedure: ESOPHAGOGASTRODUODENOSCOPY (EGD);  Surgeon: Rogene Houston, MD;  [facility];  Service: Endoscopy;  Laterality: N/A;  . ESOPHAGOGASTRODUODENOSCOPY (EGD) WITH PROPOFOL N/A 02/18/2017   Procedure: ESOPHAGOGASTRODUODENOSCOPY (EGD) WITH PROPOFOL;  Surgeon: Wonda Horner, MD;  [facility];  Service: Endoscopy;  Laterality: N/A;  . HEMORRHOID SURGERY  1959  . IR CHOLANGIOGRAM EXISTING TUBE  11/26/2016  . KIDNEY STONE SURGERY  1970's  . PARTIAL COLECTOMY  ~ 40 years ago   some of large and small  . PORTA CATH INSERTION N/A 03/17/2017   Procedure: PORTA CATH INSERTION;  Surgeon: Katha Cabal, MD;   Location: [facility];  Service: Cardiovascular;  Laterality: N/A;  . PROSTATE SURGERY    . THYROIDECTOMY Right 05/04/2012   Procedure: RIGHT HEMI THYROIDECTOMY;  Surgeon: Ascencion Dike, MD;  Location: [facility];  Service: ENT;  Laterality: Right;  . THYROIDECTOMY    . TONSILLECTOMY AND ADENOIDECTOMY     as a child  . TRANSURETHRAL PROSTATECTOMY WITH GYRUS INSTRUMENTS N/A 07/11/2012   Procedure: TRANSURETHRAL PROSTATECTOMY WITH GYRUS INSTRUMENTS;  Surgeon: Claybon Jabs, MD;  [facility];  Service: Urology;  Laterality: N/A;  . UPPER GASTROINTESTINAL ENDOSCOPY      FAMILY HISTORY: Family History  Problem Relation Age of Onset  . Diabetes Mother   . Stroke Father   . Healthy Daughter   . Healthy Son   . Heart disease Sister     ADVANCED DIRECTIVES (Y/N):  N  HEALTH MAINTENANCE: Social History   Tobacco Use  . Smoking status: Never Smoker  . Smokeless tobacco: Never Used  Substance Use Topics  . Alcohol use: No    Alcohol/week: 0.0 oz  . Drug use: No     Colonoscopy:  PAP:  Bone density:  Lipid panel:  No Known Allergies  Current Outpatient Medications  Medication Sig Dispense Refill  . allopurinol (ZYLOPRIM) 300 MG tablet Take 1 tablet (300 mg total) by mouth every morning. 90 tablet 3  . amLODipine (NORVASC) 5 MG tablet TAKE 1 TABLET BY MOUTH  DAILY 90 tablet 3  . aspirin EC 81 MG tablet Take 81 mg by mouth at bedtime.    . fentaNYL (DURAGESIC - DOSED MCG/HR) 25 MCG/HR patch Place 1 patch (25 mcg total) onto the skin every 3 (three) days. 10 patch 0  . HYDROcodone-acetaminophen (NORCO/VICODIN) 5-325 MG tablet Take 1-2 tablets by mouth every 4 (four) hours as needed for moderate pain. 30 tablet 0  . lidocaine-prilocaine (EMLA) cream Apply to port 1 hours prior to chemotherapy appointment. Cover with plastic wrap. 30 g 3  . losartan (COZAAR) 100 MG tablet Take 1 tablet (100 mg total) by mouth daily. 90 tablet 3  . megestrol (MEGACE) 40 MG tablet Take 1 tablet (40  mg total) by mouth daily. 30 tablet 2  . mesalamine (PENTASA) 500 MG CR capsule Take 1 capsule (500 mg total) by mouth 4 (four) times daily. (Patient taking differently: Take 1,000 mg by mouth 2 (two) times daily. ) 360 capsule 3  . metoprolol tartrate (LOPRESSOR) 50 MG tablet Take 1 tablet (50 mg total) by mouth 2 (two) times daily. 180 tablet 3  . Multiple Vitamin (MULTIVITAMIN WITH MINERALS) TABS tablet Take 2 tablets by mouth daily.    Marland Kitchen oxybutynin (DITROPAN-XL) 5 MG 24 hr tablet Take 1 tablet (5 mg total) by mouth at bedtime. 90 tablet 1  . pantoprazole (PROTONIX) 40 MG tablet Take 1 tablet (40 mg total) by mouth daily. 30 tablet 1  . simvastatin (ZOCOR) 20 MG tablet Take 1 tablet (20 mg total) by mouth daily. 90 tablet 3  . vitamin B-12 (CYANOCOBALAMIN) 1000 MCG tablet Take 1,000 mcg by mouth 2 (two) times daily.    Marland Kitchen acetaminophen (TYLENOL) 500 MG tablet Take 1,000 mg by mouth every 4 (  four) hours as needed for moderate pain or headache.     . bisacodyl (DULCOLAX) 10 MG suppository Place 1 suppository (10 mg total) rectally daily as needed for moderate constipation. (Patient not taking: Reported on 06/15/2017) 12 suppository 0  . fluticasone (FLONASE) 50 MCG/ACT nasal spray USE 1 SPRAY INTO BOTH  NOSTRILS DAILY (Patient not taking: Reported on 06/15/2017) 32 g 0  . Histamine Dihydrochloride (AUSTRALIAN DREAM ARTHRITIS EX) Apply 1 application topically 2 (two) times daily as needed (back pain).     . ondansetron (ZOFRAN) 8 MG tablet Take 1 tablet (8 mg total) by mouth 2 (two) times daily. (Patient not taking: Reported on 06/15/2017) 30 tablet 3   No current facility-administered medications for this visit.    Facility-Administered Medications Ordered in Other Visits  Medication Dose Route Frequency Provider Last Rate Last Dose  . gemcitabine (GEMZAR) 2,000 mg in sodium chloride 0.9 % 250 mL chemo infusion  2,000 mg Intravenous Once Lloyd Huger, MD      . heparin lock flush 100 unit/mL   500 Units Intravenous Once Lloyd Huger, MD      . heparin lock flush 100 unit/mL  500 Units Intracatheter Once PRN Lloyd Huger, MD      . sodium chloride flush (NS) 0.9 % injection 10 mL  10 mL Intravenous PRN Lloyd Huger, MD   10 mL at 06/15/17 0850    OBJECTIVE: Vitals:   06/15/17 0911  BP: 116/64  Pulse: 93  Resp: 18  Temp: (!) 96.2 F (35.7 C)     Body mass index is 26.73 kg/m.    ECOG FS:1 - Symptomatic but completely ambulatory  General: Well-developed, well-nourished, no acute distress. Eyes: Pink conjunctiva, anicteric sclera. Lungs: Clear to auscultation bilaterally. Heart: Regular rate and rhythm. No rubs, murmurs, or gallops. Abdomen: Soft, nontender, nondistended. No organomegaly noted, normoactive bowel sounds. Musculoskeletal: No edema, cyanosis, or clubbing. Neuro: Alert, answering all questions appropriately. Cranial nerves grossly intact. Skin: No rashes or petechiae noted. Psych: Normal affect.  LAB RESULTS:  Lab Results  Component Value Date   NA 133 (L) 06/15/2017   K 4.6 06/15/2017   CL 106 06/15/2017   CO2 19 (L) 06/15/2017   GLUCOSE 138 (H) 06/15/2017   BUN 34 (H) 06/15/2017   CREATININE 1.08 06/15/2017   CALCIUM 8.9 06/15/2017   PROT 7.0 06/15/2017   ALBUMIN 3.3 (L) 06/15/2017   AST 48 (H) 06/15/2017   ALT 39 06/15/2017   ALKPHOS 411 (H) 06/15/2017   BILITOT 0.7 06/15/2017   GFRNONAA >60 06/15/2017   GFRAA >60 06/15/2017    Lab Results  Component Value Date   WBC 8.0 06/15/2017   NEUTROABS 6.1 06/15/2017   HGB 9.3 (L) 06/15/2017   HCT 26.7 (L) 06/15/2017   MCV 94.3 06/15/2017   PLT 182 06/15/2017     STUDIES: No results found.  ASSESSMENT: Adenocarcinoma of gallbladder.  PLAN:    1. Adenocarcinoma of gallbladder: PET scan results from March 19, 2017 reviewed independently confirming stage IV disease with omental and liver metastasis.  Continue with palliative chemotherapy using gemcitabine and  cisplatin on day 1 with gemcitabine only on days 8 and 15.  This will be a 28-day cycle.  Cisplatin has been dose reduced to 40 mg/m given his difficulties with cycle 1.  Proceed with cycle 3, day 8 today.  Gemcitabine only.  Return to clinic in 1 week for further evaluation and consideration of cycle 3, day 15.  Patient's PET scan has been scheduled for July 06, 2017.   2.  Poor appetite: Improved.  Appreciate dietary input, patient will have follow-up with dietary next week as well.  Continue Megace as prescribed. 3.  Hyperglycemia: Chronic.  Monitor closely as patient will be getting dexamethasone as one of his premedications. 4.  Pain: Patient does not complain of this today.  Chronic and unchanged.  Continue fentanyl patch and hydrocodone as prescribed. 5.  Constipation: Continue Dulcolax as needed. 6.  Burping/indigestion: Improving.  Continue OTC treatment. 7.  Hyponatremia: Mild, monitor. 8.  Elevated liver enzymes: Resolved. 9.  Anemia: Hemoglobin decreased, but stable.  Monitor. 10.  Fatigue: Multifactorial.  Patient was previously given a referral to the CARE program.  Approximately 30 minutes was spent in discussion of which greater than 50% was consultation.   Patient expressed understanding and was in agreement with this plan. He also understands that He can call clinic at any time with any questions, concerns, or complaints.   Cancer Staging Adenocarcinoma of gallbladder [facility]) Staging form: Gallbladder, AJCC 8th Edition - Clinical stage from 03/03/2017: Stage IVB (cT3, cN0, cM1) - Signed by Lloyd Huger, MD on 03/23/2017   Lloyd Huger, MD   06/15/2017 10:13 AM

## 2017-06-15 ENCOUNTER — Inpatient Hospital Stay (HOSPITAL_BASED_OUTPATIENT_CLINIC_OR_DEPARTMENT_OTHER): Payer: Medicare Other | Admitting: Oncology

## 2017-06-15 ENCOUNTER — Other Ambulatory Visit: Payer: Self-pay

## 2017-06-15 ENCOUNTER — Inpatient Hospital Stay: Payer: Medicare Other

## 2017-06-15 ENCOUNTER — Encounter: Payer: Self-pay | Admitting: Oncology

## 2017-06-15 VITALS — BP 116/64 | HR 93 | Temp 96.2°F | Resp 18 | Wt 181.0 lb

## 2017-06-15 DIAGNOSIS — G8929 Other chronic pain: Secondary | ICD-10-CM

## 2017-06-15 DIAGNOSIS — R739 Hyperglycemia, unspecified: Secondary | ICD-10-CM | POA: Diagnosis not present

## 2017-06-15 DIAGNOSIS — R000 Tachycardia, unspecified: Secondary | ICD-10-CM | POA: Diagnosis not present

## 2017-06-15 DIAGNOSIS — K30 Functional dyspepsia: Secondary | ICD-10-CM | POA: Diagnosis not present

## 2017-06-15 DIAGNOSIS — Z79899 Other long term (current) drug therapy: Secondary | ICD-10-CM | POA: Diagnosis not present

## 2017-06-15 DIAGNOSIS — M109 Gout, unspecified: Secondary | ICD-10-CM | POA: Diagnosis not present

## 2017-06-15 DIAGNOSIS — K5900 Constipation, unspecified: Secondary | ICD-10-CM

## 2017-06-15 DIAGNOSIS — C787 Secondary malignant neoplasm of liver and intrahepatic bile duct: Secondary | ICD-10-CM | POA: Diagnosis not present

## 2017-06-15 DIAGNOSIS — D509 Iron deficiency anemia, unspecified: Secondary | ICD-10-CM

## 2017-06-15 DIAGNOSIS — I10 Essential (primary) hypertension: Secondary | ICD-10-CM | POA: Diagnosis not present

## 2017-06-15 DIAGNOSIS — K5090 Crohn's disease, unspecified, without complications: Secondary | ICD-10-CM | POA: Diagnosis not present

## 2017-06-15 DIAGNOSIS — G4730 Sleep apnea, unspecified: Secondary | ICD-10-CM

## 2017-06-15 DIAGNOSIS — E871 Hypo-osmolality and hyponatremia: Secondary | ICD-10-CM | POA: Diagnosis not present

## 2017-06-15 DIAGNOSIS — C23 Malignant neoplasm of gallbladder: Secondary | ICD-10-CM | POA: Diagnosis not present

## 2017-06-15 DIAGNOSIS — Z5111 Encounter for antineoplastic chemotherapy: Secondary | ICD-10-CM | POA: Diagnosis not present

## 2017-06-15 DIAGNOSIS — M129 Arthropathy, unspecified: Secondary | ICD-10-CM

## 2017-06-15 DIAGNOSIS — R630 Anorexia: Secondary | ICD-10-CM | POA: Diagnosis not present

## 2017-06-15 DIAGNOSIS — Z87442 Personal history of urinary calculi: Secondary | ICD-10-CM

## 2017-06-15 DIAGNOSIS — E7800 Pure hypercholesterolemia, unspecified: Secondary | ICD-10-CM

## 2017-06-15 DIAGNOSIS — M545 Low back pain: Secondary | ICD-10-CM | POA: Diagnosis not present

## 2017-06-15 DIAGNOSIS — R0602 Shortness of breath: Secondary | ICD-10-CM

## 2017-06-15 DIAGNOSIS — D696 Thrombocytopenia, unspecified: Secondary | ICD-10-CM

## 2017-06-15 DIAGNOSIS — C786 Secondary malignant neoplasm of retroperitoneum and peritoneum: Secondary | ICD-10-CM | POA: Diagnosis not present

## 2017-06-15 DIAGNOSIS — K219 Gastro-esophageal reflux disease without esophagitis: Secondary | ICD-10-CM

## 2017-06-15 DIAGNOSIS — F418 Other specified anxiety disorders: Secondary | ICD-10-CM

## 2017-06-15 DIAGNOSIS — Z7982 Long term (current) use of aspirin: Secondary | ICD-10-CM

## 2017-06-15 DIAGNOSIS — R5383 Other fatigue: Secondary | ICD-10-CM | POA: Diagnosis not present

## 2017-06-15 LAB — COMPREHENSIVE METABOLIC PANEL
ALBUMIN: 3.3 g/dL — AB (ref 3.5–5.0)
ALT: 39 U/L (ref 17–63)
ANION GAP: 8 (ref 5–15)
AST: 48 U/L — AB (ref 15–41)
Alkaline Phosphatase: 411 U/L — ABNORMAL HIGH (ref 38–126)
BUN: 34 mg/dL — AB (ref 6–20)
CO2: 19 mmol/L — AB (ref 22–32)
Calcium: 8.9 mg/dL (ref 8.9–10.3)
Chloride: 106 mmol/L (ref 101–111)
Creatinine, Ser: 1.08 mg/dL (ref 0.61–1.24)
GFR calc Af Amer: >60 mL/min (ref >60)
GFR calc non Af Amer: >60 mL/min (ref >60)
GLUCOSE: 138 mg/dL — AB (ref 65–99)
Potassium: 4.6 mmol/L (ref 3.5–5.1)
SODIUM: 133 mmol/L — AB (ref 135–145)
Total Bilirubin: 0.7 mg/dL (ref 0.3–1.2)
Total Protein: 7 g/dL (ref 6.5–8.1)

## 2017-06-15 LAB — CBC WITH DIFFERENTIAL/PLATELET
BASOS PCT: 1 %
Basophils Absolute: 0.1 10*3/uL (ref 0–0.1)
Eosinophils Absolute: 0 10*3/uL (ref 0–0.7)
Eosinophils Relative: 0 %
HEMATOCRIT: 26.7 % — AB (ref 40.0–52.0)
Hemoglobin: 9.3 g/dL — ABNORMAL LOW (ref 13.0–18.0)
Lymphocytes Relative: 12 %
Lymphs Abs: 1 10*3/uL (ref 1.0–3.6)
MCH: 32.7 pg (ref 26.0–34.0)
MCHC: 34.7 g/dL (ref 32.0–36.0)
MCV: 94.3 fL (ref 80.0–100.0)
MONO ABS: 0.8 10*3/uL (ref 0.2–1.0)
MONOS PCT: 10 %
NEUTROS ABS: 6.1 10*3/uL (ref 1.4–6.5)
Neutrophils Relative %: 77 %
Platelets: 182 10*3/uL (ref 150–440)
RBC: 2.84 MIL/uL — ABNORMAL LOW (ref 4.40–5.90)
RDW: 25.3 % — AB (ref 11.5–14.5)
WBC: 8 10*3/uL (ref 3.8–10.6)

## 2017-06-15 MED ORDER — SODIUM CHLORIDE 0.9% FLUSH
10.0000 mL | INTRAVENOUS | Status: DC | PRN
  Administered 2017-06-15: 10 mL via INTRAVENOUS
  Filled 2017-06-15: qty 10

## 2017-06-15 MED ORDER — SODIUM CHLORIDE 0.9 % IV SOLN
Freq: Once | INTRAVENOUS | Status: AC
  Administered 2017-06-15: 10:00:00 via INTRAVENOUS
  Filled 2017-06-15: qty 1000

## 2017-06-15 MED ORDER — HEPARIN SOD (PORK) LOCK FLUSH 100 UNIT/ML IV SOLN: 500.0000 [IU] | Freq: Once | INTRAVENOUS | Status: DC | PRN

## 2017-06-15 MED ORDER — SODIUM CHLORIDE 0.9 % IV SOLN
2000.0000 mg | Freq: Once | INTRAVENOUS | Status: AC
  Administered 2017-06-15: 2000 mg via INTRAVENOUS
  Filled 2017-06-15: qty 52.6

## 2017-06-15 MED ORDER — HEPARIN SOD (PORK) LOCK FLUSH 100 UNIT/ML IV SOLN
500.0000 [IU] | Freq: Once | INTRAVENOUS | Status: AC
  Administered 2017-06-15: 500 [IU] via INTRAVENOUS
  Filled 2017-06-15: qty 5

## 2017-06-15 MED ORDER — PROCHLORPERAZINE MALEATE 5 MG PO TABS
10.0000 mg | ORAL_TABLET | Freq: Once | ORAL | Status: AC
  Administered 2017-06-15: 10 mg via ORAL
  Filled 2017-06-15: qty 2

## 2017-06-15 NOTE — Progress Notes (Signed)
Here for follow up. Per pt did not feel well over the week end- weak and tired x 3 days and feeling better as of yesterday. Bowels moving /loose  But able to move on his own.

## 2017-06-20 NOTE — Progress Notes (Signed)
[place]  [phone] [phone]  ID: Thomas David OB: 09/11/1935  MR#: [id number]  ULA#:[id number]  Patient Care Team: Leone Haven, MD as PCP - General (Family Medicine) Kathyrn Drown, MD (Family Medicine) Clent Jacks, RN as Registered Nurse  CHIEF COMPLAINT: Adenocarcinoma of gallbladder.  INTERVAL HISTORY: Patient returns to clinic today for further evaluation and consideration of cycle 3, day 15 cisplatin and gemcitabine.  Gemcitabine only today.  He currently feels well and is asymptomatic.  He has chronic weakness and fatigue and occasional dyspnea on exertion. He has an improved appetite.  He has no neurologic complaints.  He denies any recent fevers or illnesses. He denies any chest pain.  He does not complain of abdominal pain today. He denies any nausea, vomiting, or diarrhea.  He has no urinary complaints.  Patient offers no further specific complaints today.  REVIEW OF SYSTEMS:   Review of Systems  Constitutional: Positive for malaise/fatigue. Negative for fever and weight loss.  Eyes: Negative for blurred vision.  Respiratory: Positive for shortness of breath. Negative for cough.   Cardiovascular: Negative.  Negative for chest pain and leg swelling.  Gastrointestinal: Positive for constipation. Negative for abdominal pain, blood in stool, heartburn, melena and nausea.  Genitourinary: Negative.  Negative for dysuria and frequency.  Musculoskeletal: Negative.  Negative for falls.  Skin: Negative.  Negative for rash.  Neurological: Positive for weakness. Negative for sensory change and focal weakness.  Psychiatric/Behavioral: Negative.  The patient is not nervous/anxious.     As per HPI. Otherwise, a complete review of systems is negative.  PAST MEDICAL HISTORY: Past Medical History:  Diagnosis Date  . Anxiety   . Arthritis   . Chronic lower back pain   . Crohn's disease ([place])   . Depression   . Difficult  intubation    patient denies on 02/12/17  . GERD (gastroesophageal reflux disease)   . Gout   . History of kidney stones 40 years ago  . Hypercholesteremia   . Hypertension   . Prediabetes   . Sleep apnea    cpap     PAST SURGICAL HISTORY: Past Surgical History:  Procedure Laterality Date  . CHOLECYSTECTOMY N/A 11/03/2016   Procedure: LAPAROSCOPIC CHOLECYSTostomy with tube placement ;  Surgeon: Johnathan Hausen, MD;  Location: [facility];  Service: General;  Laterality: N/A;  . CHOLECYSTECTOMY N/A 02/15/2017   Procedure: LAPAROSCOPIC SUBTOTAL CHOLECYSTECTOMY, BIOPSY OF GALLBLADDER;  Surgeon: Johnathan Hausen, MD;  Location: [facility];  Service: General;  Laterality: N/A;  . COLECTOMY  1973   "part of large intestines" (05/04/2012)  . COLON RESECTION    . COLONOSCOPY    . COLONOSCOPY N/A 05/23/2014   Procedure: COLONOSCOPY;  Surgeon: Rogene Houston, MD;  Location: [facility];  Service: Endoscopy;  Laterality: N/A;  1200  . ESOPHAGEAL DILATION N/A 05/23/2014   Procedure: ESOPHAGEAL DILATION;  Surgeon: Rogene Houston, MD;  Location: [facility];  Service: Endoscopy;  Laterality: N/A;  . ESOPHAGOGASTRODUODENOSCOPY N/A 03/16/2014   Procedure: esophageal dilation ;  Surgeon: Rogene Houston, MD;  Location: [facility];  Service: Endoscopy;  Laterality: N/A;  . ESOPHAGOGASTRODUODENOSCOPY N/A 05/23/2014   Procedure: ESOPHAGOGASTRODUODENOSCOPY (EGD);  Surgeon: Rogene Houston, MD;  [facility]: [facility];  Service: Endoscopy;  Laterality: N/A;  . ESOPHAGOGASTRODUODENOSCOPY (EGD) WITH PROPOFOL N/A 02/18/2017   Procedure: ESOPHAGOGASTRODUODENOSCOPY (EGD) WITH PROPOFOL;  Surgeon: Wonda Horner, MD;  Location: [facility];  Service: Endoscopy;  Laterality: N/A;  .  [place]  . IR CHOLANGIOGRAM EXISTING TUBE  11/26/2016  . KIDNEY STONE SURGERY  1970's  . PARTIAL COLECTOMY  ~ 40 years ago   some of large and small  . PORTA CATH INSERTION N/A 03/17/2017   Procedure: PORTA CATH  INSERTION;  Surgeon: Katha Cabal, MD;  Location: [facility];  Service: Cardiovascular;  Laterality: N/A;  . PROSTATE SURGERY    . THYROIDECTOMY Right 05/04/2012   Procedure: RIGHT HEMI THYROIDECTOMY;  Surgeon: Ascencion Dike, MD;  Location: [place];  Service: ENT;  Laterality: Right;  . THYROIDECTOMY    . TONSILLECTOMY AND ADENOIDECTOMY     as a child  . TRANSURETHRAL PROSTATECTOMY WITH GYRUS INSTRUMENTS N/A 07/11/2012   Procedure: TRANSURETHRAL PROSTATECTOMY WITH GYRUS INSTRUMENTS;  Surgeon: Claybon Jabs, MD;  Location: [facility];  Service: Urology;  Laterality: N/A;  . UPPER GASTROINTESTINAL ENDOSCOPY      FAMILY HISTORY: Family History  Problem Relation Age of Onset  . Diabetes Mother   . Stroke Father   . Healthy Daughter   . Healthy Son   . Heart disease Sister     ADVANCED DIRECTIVES (Y/N):  N  HEALTH MAINTENANCE: Social History   Tobacco Use  . Smoking status: Never Smoker  . Smokeless tobacco: Never Used  Substance Use Topics  . Alcohol use: No    Alcohol/week: 0.0 oz  . Drug use: No     Colonoscopy:  PAP:  Bone density:  Lipid panel:  No Known Allergies  Current Outpatient Medications  Medication Sig Dispense Refill  . allopurinol (ZYLOPRIM) 300 MG tablet Take 1 tablet (300 mg total) by mouth every morning. 90 tablet 3  . amLODipine (NORVASC) 5 MG tablet TAKE 1 TABLET BY MOUTH  DAILY 90 tablet 3  . aspirin EC 81 MG tablet Take 81 mg by mouth at bedtime.    . fentaNYL (DURAGESIC - DOSED MCG/HR) 25 MCG/HR patch Place 1 patch (25 mcg total) onto the skin every 3 (three) days. 10 patch 0  . lidocaine-prilocaine (EMLA) cream Apply to port 1 hours prior to chemotherapy appointment. Cover with plastic wrap. 30 g 3  . losartan (COZAAR) 100 MG tablet Take 1 tablet (100 mg total) by mouth daily. 90 tablet 3  . megestrol (MEGACE) 40 MG tablet Take 1 tablet (40 mg total) by mouth daily. 30 tablet 2  . mesalamine (PENTASA) 500 MG CR capsule Take 1 capsule (500 mg  total) by mouth 4 (four) times daily. (Patient taking differently: Take 1,000 mg by mouth 2 (two) times daily. ) 360 capsule 3  . metoprolol tartrate (LOPRESSOR) 50 MG tablet Take 1 tablet (50 mg total) by mouth 2 (two) times daily. 180 tablet 3  . Multiple Vitamin (MULTIVITAMIN WITH MINERALS) TABS tablet Take 2 tablets by mouth daily.    . pantoprazole (PROTONIX) 40 MG tablet Take 1 tablet (40 mg total) by mouth daily. 30 tablet 1  . simvastatin (ZOCOR) 20 MG tablet Take 1 tablet (20 mg total) by mouth daily. 90 tablet 3  . vitamin B-12 (CYANOCOBALAMIN) 1000 MCG tablet Take 1,000 mcg by mouth 2 (two) times daily.    Marland Kitchen acetaminophen (TYLENOL) 500 MG tablet Take 1,000 mg by mouth every 4 (four) hours as needed for moderate pain or headache.     . bisacodyl (DULCOLAX) 10 MG suppository Place 1 suppository (10 mg total) rectally daily as needed for moderate constipation. (Patient not taking: Reported on 06/15/2017) 12 suppository 0  .  fluticasone (FLONASE) 50 MCG/ACT nasal spray USE 1 SPRAY INTO BOTH  NOSTRILS DAILY (Patient not taking: Reported on 06/15/2017) 32 g 0  . Histamine Dihydrochloride (AUSTRALIAN DREAM ARTHRITIS EX) Apply 1 application topically 2 (two) times daily as needed (back pain).     Marland Kitchen HYDROcodone-acetaminophen (NORCO/VICODIN) 5-325 MG tablet Take 1-2 tablets by mouth every 4 (four) hours as needed for moderate pain. (Patient not taking: Reported on 06/22/2017) 30 tablet 0  . ondansetron (ZOFRAN) 8 MG tablet Take 1 tablet (8 mg total) by mouth 2 (two) times daily. (Patient not taking: Reported on 06/15/2017) 30 tablet 3  . oxybutynin (DITROPAN-XL) 5 MG 24 hr tablet Take 1 tablet (5 mg total) by mouth at bedtime. (Patient not taking: Reported on 06/22/2017) 90 tablet 1   No current facility-administered medications for this visit.     OBJECTIVE: Vitals:   06/22/17 1044  BP: 136/70  Pulse: 81  Resp: 18  Temp: (!) 95.8 F (35.4 C)     Body mass index is 26.35 kg/m.    ECOG FS:1 -  Symptomatic but completely ambulatory  General: Well-developed, well-nourished, no acute distress. Eyes: Pink conjunctiva, anicteric sclera. Lungs: Clear to auscultation bilaterally. Heart: Regular rate and rhythm. No rubs, murmurs, or gallops. Abdomen: Soft, nontender, nondistended. No organomegaly noted, normoactive bowel sounds. Musculoskeletal: No edema, cyanosis, or clubbing. Neuro: Alert, answering all questions appropriately. Cranial nerves grossly intact. Skin: No rashes or petechiae noted. Psych: Normal affect.  LAB RESULTS:  Lab Results  Component Value Date   NA 136 06/22/2017   K 4.5 06/22/2017   CL 108 06/22/2017   CO2 18 (L) 06/22/2017   GLUCOSE 106 (H) 06/22/2017   BUN 41 (H) 06/22/2017   CREATININE 1.14 06/22/2017   CALCIUM 9.4 06/22/2017   PROT 7.6 06/22/2017   ALBUMIN 3.6 06/22/2017   AST 35 06/22/2017   ALT 30 06/22/2017   ALKPHOS 394 (H) 06/22/2017   BILITOT 0.6 06/22/2017   GFRNONAA 59 (L) 06/22/2017   GFRAA >60 06/22/2017    Lab Results  Component Value Date   WBC 3.5 (L) 06/22/2017   NEUTROABS 2.1 06/22/2017   HGB 8.6 (L) 06/22/2017   HCT 24.6 (L) 06/22/2017   MCV 94.7 06/22/2017   PLT 90 (L) 06/22/2017     STUDIES: No results found.  ASSESSMENT: Adenocarcinoma of gallbladder.  PLAN:    1. Adenocarcinoma of gallbladder: PET scan results from March 19, 2017 reviewed independently confirming stage IV disease with omental and liver metastasis.  Continue with palliative chemotherapy using gemcitabine and cisplatin on day 1 with gemcitabine only on days 8 and 15.  This will be a 28-day cycle.  Cisplatin has been dose reduced to 40 mg/m given his difficulties with cycle 1.  Proceed with cycle 3-day 15 today despite thrombocytopenia.  This is gemcitabine only.  Return to clinic in 2 weeks for further evaluation and consideration of cycle 4, day 1.  Patient will have a restaging PET scan prior to his next treatment on July 06, 2017 to assess for  interval change.   2.  Poor appetite: Significantly improved.  Appreciate dietary input.  Continue Megace as needed. 3.  Hyperglycemia: Patient has significantly improved blood glucose control.  Monitor.   4.  Pain: Patient does not complain of this today.  Chronic and unchanged.  Continue fentanyl patch and hydrocodone as prescribed. 5.  Constipation: Continue Dulcolax as needed. 6.  Burping/indigestion: Patient does not complain of this today.  Continue OTC treatment. 7.  Hyponatremia: Mild, monitor. 8.  Elevated liver enzymes: Resolved. 9.  Anemia: Hemoglobin has trended down slightly, monitor. 10.  Fatigue: Multifactorial.  Patient was previously given a referral to the [facility] program. 11.  Thrombocytopenia: Secondary to chemotherapy.  Proceed with treatment as above.   Patient expressed understanding and was in agreement with this plan. He also understands that He can call clinic at any time with any questions, concerns, or complaints.   Cancer Staging Adenocarcinoma of gallbladder [facility]) Staging form: Gallbladder, AJCC 8th Edition - Clinical stage from 03/03/2017: Stage IVB (cT3, cN0, cM1) - Signed by Lloyd Huger, MD on 03/23/2017   Lloyd Huger, MD   06/25/2017 11:16 AM

## 2017-06-22 ENCOUNTER — Encounter: Payer: Self-pay | Admitting: Oncology

## 2017-06-22 ENCOUNTER — Inpatient Hospital Stay (HOSPITAL_BASED_OUTPATIENT_CLINIC_OR_DEPARTMENT_OTHER): Payer: Medicare Other | Admitting: Oncology

## 2017-06-22 ENCOUNTER — Inpatient Hospital Stay: Payer: Medicare Other

## 2017-06-22 ENCOUNTER — Other Ambulatory Visit: Payer: Self-pay

## 2017-06-22 VITALS — BP 136/70 | HR 81 | Temp 95.8°F | Resp 18 | Wt 178.4 lb

## 2017-06-22 DIAGNOSIS — M545 Low back pain: Secondary | ICD-10-CM

## 2017-06-22 DIAGNOSIS — D696 Thrombocytopenia, unspecified: Secondary | ICD-10-CM

## 2017-06-22 DIAGNOSIS — K30 Functional dyspepsia: Secondary | ICD-10-CM | POA: Diagnosis not present

## 2017-06-22 DIAGNOSIS — C23 Malignant neoplasm of gallbladder: Secondary | ICD-10-CM

## 2017-06-22 DIAGNOSIS — R5383 Other fatigue: Secondary | ICD-10-CM

## 2017-06-22 DIAGNOSIS — R000 Tachycardia, unspecified: Secondary | ICD-10-CM | POA: Diagnosis not present

## 2017-06-22 DIAGNOSIS — D509 Iron deficiency anemia, unspecified: Secondary | ICD-10-CM

## 2017-06-22 DIAGNOSIS — I10 Essential (primary) hypertension: Secondary | ICD-10-CM | POA: Diagnosis not present

## 2017-06-22 DIAGNOSIS — M129 Arthropathy, unspecified: Secondary | ICD-10-CM | POA: Diagnosis not present

## 2017-06-22 DIAGNOSIS — M109 Gout, unspecified: Secondary | ICD-10-CM | POA: Diagnosis not present

## 2017-06-22 DIAGNOSIS — G4730 Sleep apnea, unspecified: Secondary | ICD-10-CM

## 2017-06-22 DIAGNOSIS — Z5111 Encounter for antineoplastic chemotherapy: Secondary | ICD-10-CM

## 2017-06-22 DIAGNOSIS — K5090 Crohn's disease, unspecified, without complications: Secondary | ICD-10-CM | POA: Diagnosis not present

## 2017-06-22 DIAGNOSIS — R630 Anorexia: Secondary | ICD-10-CM

## 2017-06-22 DIAGNOSIS — E7800 Pure hypercholesterolemia, unspecified: Secondary | ICD-10-CM | POA: Diagnosis not present

## 2017-06-22 DIAGNOSIS — K5900 Constipation, unspecified: Secondary | ICD-10-CM

## 2017-06-22 DIAGNOSIS — C787 Secondary malignant neoplasm of liver and intrahepatic bile duct: Secondary | ICD-10-CM

## 2017-06-22 DIAGNOSIS — Z87442 Personal history of urinary calculi: Secondary | ICD-10-CM

## 2017-06-22 DIAGNOSIS — Z7982 Long term (current) use of aspirin: Secondary | ICD-10-CM

## 2017-06-22 DIAGNOSIS — R739 Hyperglycemia, unspecified: Secondary | ICD-10-CM

## 2017-06-22 DIAGNOSIS — Z79899 Other long term (current) drug therapy: Secondary | ICD-10-CM | POA: Diagnosis not present

## 2017-06-22 DIAGNOSIS — E871 Hypo-osmolality and hyponatremia: Secondary | ICD-10-CM | POA: Diagnosis not present

## 2017-06-22 DIAGNOSIS — C786 Secondary malignant neoplasm of retroperitoneum and peritoneum: Secondary | ICD-10-CM

## 2017-06-22 DIAGNOSIS — F418 Other specified anxiety disorders: Secondary | ICD-10-CM

## 2017-06-22 DIAGNOSIS — G8929 Other chronic pain: Secondary | ICD-10-CM

## 2017-06-22 DIAGNOSIS — K219 Gastro-esophageal reflux disease without esophagitis: Secondary | ICD-10-CM

## 2017-06-22 DIAGNOSIS — R0602 Shortness of breath: Secondary | ICD-10-CM | POA: Diagnosis not present

## 2017-06-22 LAB — COMPREHENSIVE METABOLIC PANEL
ALT: 30 U/L (ref 17–63)
AST: 35 U/L (ref 15–41)
Albumin: 3.6 g/dL (ref 3.5–5.0)
Alkaline Phosphatase: 394 U/L — ABNORMAL HIGH (ref 38–126)
Anion gap: 10 (ref 5–15)
BILIRUBIN TOTAL: 0.6 mg/dL (ref 0.3–1.2)
BUN: 41 mg/dL — AB (ref 6–20)
CO2: 18 mmol/L — ABNORMAL LOW (ref 22–32)
CREATININE: 1.14 mg/dL (ref 0.61–1.24)
Calcium: 9.4 mg/dL (ref 8.9–10.3)
Chloride: 108 mmol/L (ref 101–111)
GFR calc Af Amer: >60 mL/min (ref >60)
GFR, EST NON AFRICAN AMERICAN: 59 mL/min — AB (ref >60)
GLUCOSE: 106 mg/dL — AB (ref 65–99)
Potassium: 4.5 mmol/L (ref 3.5–5.1)
Sodium: 136 mmol/L (ref 135–145)
TOTAL PROTEIN: 7.6 g/dL (ref 6.5–8.1)

## 2017-06-22 LAB — CBC WITH DIFFERENTIAL/PLATELET
BASOS ABS: 0 10*3/uL (ref 0–0.1)
Basophils Relative: 1 %
EOS ABS: 0 10*3/uL (ref 0–0.7)
EOS PCT: 0 %
HCT: 24.6 % — ABNORMAL LOW (ref 40.0–52.0)
Hemoglobin: 8.6 g/dL — ABNORMAL LOW (ref 13.0–18.0)
LYMPHS ABS: 1 10*3/uL (ref 1.0–3.6)
Lymphocytes Relative: 28 %
MCH: 33.1 pg (ref 26.0–34.0)
MCHC: 34.9 g/dL (ref 32.0–36.0)
MCV: 94.7 fL (ref 80.0–100.0)
MONO ABS: 0.4 10*3/uL (ref 0.2–1.0)
Monocytes Relative: 10 %
Neutro Abs: 2.1 10*3/uL (ref 1.4–6.5)
Neutrophils Relative %: 61 %
PLATELETS: 90 10*3/uL — AB (ref 150–440)
RBC: 2.59 MIL/uL — AB (ref 4.40–5.90)
RDW: 24.6 % — AB (ref 11.5–14.5)
WBC: 3.5 10*3/uL — AB (ref 3.8–10.6)

## 2017-06-22 MED ORDER — PROCHLORPERAZINE MALEATE 10 MG PO TABS
10.0000 mg | ORAL_TABLET | Freq: Once | ORAL | Status: AC
  Administered 2017-06-22: 10 mg via ORAL
  Filled 2017-06-22: qty 1

## 2017-06-22 MED ORDER — HEPARIN SOD (PORK) LOCK FLUSH 100 UNIT/ML IV SOLN
500.0000 [IU] | Freq: Once | INTRAVENOUS | Status: AC
  Administered 2017-06-22: 500 [IU] via INTRAVENOUS

## 2017-06-22 MED ORDER — FENTANYL 25 MCG/HR TD PT72: 25.0000 ug | MEDICATED_PATCH | TRANSDERMAL | 0 refills | Status: DC

## 2017-06-22 MED ORDER — SODIUM CHLORIDE 0.9 % IV SOLN
2000.0000 mg | Freq: Once | INTRAVENOUS | Status: AC
  Administered 2017-06-22: 2000 mg via INTRAVENOUS
  Filled 2017-06-22: qty 52.6

## 2017-06-22 MED ORDER — SODIUM CHLORIDE 0.9 % IV SOLN
Freq: Once | INTRAVENOUS | Status: AC
  Administered 2017-06-22: 11:00:00 via INTRAVENOUS
  Filled 2017-06-22: qty 1000

## 2017-06-22 MED ORDER — SODIUM CHLORIDE 0.9% FLUSH
10.0000 mL | Freq: Once | INTRAVENOUS | Status: AC
Start: 2017-06-22 — End: 2017-06-22
  Administered 2017-06-22: 10 mL via INTRAVENOUS
  Filled 2017-06-22: qty 10

## 2017-06-22 NOTE — Progress Notes (Signed)
Here for follow up. SOB when he arrived. Pulse ox 99-100% on RA-  SOB resolved quickly. Pt voicing frustration w treatment. and progression . Fentynl reordered/pending.

## 2017-06-22 NOTE — Progress Notes (Signed)
Per Tillie Rung RN per [name] okay to proceed with treatment with platelets of 90.

## 2017-06-24 ENCOUNTER — Inpatient Hospital Stay: Payer: Medicare Other

## 2017-06-24 ENCOUNTER — Other Ambulatory Visit: Payer: Self-pay | Admitting: *Deleted

## 2017-06-24 DIAGNOSIS — D649 Anemia, unspecified: Secondary | ICD-10-CM

## 2017-06-24 NOTE — Progress Notes (Addendum)
Nutrition Follow-up:  Patient with adenocarcinoma of gallbladder receiving chemotherapy.  Met with patient today in clinic.  Continues to complain of lack of appetite and nothing taste right.  Reports that he usually eats 2 eggs with grits, toast for breakfast and then supper. Reports ate some chicken soup last night for dinner, brought home crab cakes, potatoes and squash.  Looked at it today but did not eat anything.  Drinks 3-4 ensure enlive daily.   Noted pale color in skin and shortness of breath after walking into consult room.   Reports gave enema today as had not had BM in 2-3 days.    Reports that he is taking megace and has been for the last month.     Medications: reviewed  Labs: glucose 106, Hgb 8.6, hct 24.6  Anthropometrics:   Weight continues to decrease noted on 5/21 178 lb 6.4 oz.  Last weight of 183 lb on 4/16.     NUTRITION DIAGNOSIS: Malnutrition continues   MALNUTRITION DIAGNOSIS: severe malnutrition continues   INTERVENTION:   Discussed importance of adding in high calorie, high protein snacks between breakfast and supper.   Discussed foods that have increased calories and protein Reviewed strategies of for taste changes.   Encouraged patient to continue to drink ensure shakes 3-4 per day. Gave patient 3rd case of ensure enlive.  Discussed calorie and protein goal with patient and showed patient how to read foods labels for calorie content.  Patient may benefit from checking iron panel.  Spoke with RN, Tillie Rung.      MONITORING, EVALUATION, GOAL: weight trends, intake   NEXT VISIT: phone follow-up  Risa Auman B. Zenia Resides, [place], [place] Registered Dietitian [phone] (pager)

## 2017-06-29 ENCOUNTER — Inpatient Hospital Stay: Payer: Medicare Other

## 2017-06-29 ENCOUNTER — Telehealth: Payer: Self-pay | Admitting: *Deleted

## 2017-06-29 ENCOUNTER — Inpatient Hospital Stay (HOSPITAL_BASED_OUTPATIENT_CLINIC_OR_DEPARTMENT_OTHER): Payer: Medicare Other | Admitting: Oncology

## 2017-06-29 ENCOUNTER — Other Ambulatory Visit: Payer: Self-pay | Admitting: *Deleted

## 2017-06-29 ENCOUNTER — Encounter: Payer: Self-pay | Admitting: Oncology

## 2017-06-29 VITALS — BP 137/76 | HR 98 | Temp 97.0°F | Resp 20 | Ht 69.0 in | Wt 179.0 lb

## 2017-06-29 DIAGNOSIS — K219 Gastro-esophageal reflux disease without esophagitis: Secondary | ICD-10-CM | POA: Diagnosis not present

## 2017-06-29 DIAGNOSIS — R000 Tachycardia, unspecified: Secondary | ICD-10-CM | POA: Diagnosis not present

## 2017-06-29 DIAGNOSIS — E7800 Pure hypercholesterolemia, unspecified: Secondary | ICD-10-CM

## 2017-06-29 DIAGNOSIS — M129 Arthropathy, unspecified: Secondary | ICD-10-CM

## 2017-06-29 DIAGNOSIS — G8929 Other chronic pain: Secondary | ICD-10-CM

## 2017-06-29 DIAGNOSIS — R0602 Shortness of breath: Secondary | ICD-10-CM

## 2017-06-29 DIAGNOSIS — Z79899 Other long term (current) drug therapy: Secondary | ICD-10-CM | POA: Diagnosis not present

## 2017-06-29 DIAGNOSIS — I10 Essential (primary) hypertension: Secondary | ICD-10-CM

## 2017-06-29 DIAGNOSIS — D696 Thrombocytopenia, unspecified: Secondary | ICD-10-CM | POA: Diagnosis not present

## 2017-06-29 DIAGNOSIS — D649 Anemia, unspecified: Secondary | ICD-10-CM

## 2017-06-29 DIAGNOSIS — K5090 Crohn's disease, unspecified, without complications: Secondary | ICD-10-CM | POA: Diagnosis not present

## 2017-06-29 DIAGNOSIS — E871 Hypo-osmolality and hyponatremia: Secondary | ICD-10-CM

## 2017-06-29 DIAGNOSIS — C23 Malignant neoplasm of gallbladder: Secondary | ICD-10-CM

## 2017-06-29 DIAGNOSIS — G4730 Sleep apnea, unspecified: Secondary | ICD-10-CM

## 2017-06-29 DIAGNOSIS — M545 Low back pain: Secondary | ICD-10-CM | POA: Diagnosis not present

## 2017-06-29 DIAGNOSIS — K5900 Constipation, unspecified: Secondary | ICD-10-CM

## 2017-06-29 DIAGNOSIS — K30 Functional dyspepsia: Secondary | ICD-10-CM

## 2017-06-29 DIAGNOSIS — M109 Gout, unspecified: Secondary | ICD-10-CM | POA: Diagnosis not present

## 2017-06-29 DIAGNOSIS — Z5111 Encounter for antineoplastic chemotherapy: Secondary | ICD-10-CM | POA: Diagnosis not present

## 2017-06-29 DIAGNOSIS — T451X5A Adverse effect of antineoplastic and immunosuppressive drugs, initial encounter: Secondary | ICD-10-CM | POA: Diagnosis not present

## 2017-06-29 DIAGNOSIS — C787 Secondary malignant neoplasm of liver and intrahepatic bile duct: Secondary | ICD-10-CM | POA: Diagnosis not present

## 2017-06-29 DIAGNOSIS — F418 Other specified anxiety disorders: Secondary | ICD-10-CM

## 2017-06-29 DIAGNOSIS — C786 Secondary malignant neoplasm of retroperitoneum and peritoneum: Secondary | ICD-10-CM | POA: Diagnosis not present

## 2017-06-29 DIAGNOSIS — D509 Iron deficiency anemia, unspecified: Secondary | ICD-10-CM | POA: Diagnosis not present

## 2017-06-29 DIAGNOSIS — R630 Anorexia: Secondary | ICD-10-CM

## 2017-06-29 DIAGNOSIS — R5383 Other fatigue: Secondary | ICD-10-CM | POA: Diagnosis not present

## 2017-06-29 DIAGNOSIS — Z87442 Personal history of urinary calculi: Secondary | ICD-10-CM

## 2017-06-29 DIAGNOSIS — D6481 Anemia due to antineoplastic chemotherapy: Secondary | ICD-10-CM | POA: Diagnosis not present

## 2017-06-29 DIAGNOSIS — Z7982 Long term (current) use of aspirin: Secondary | ICD-10-CM

## 2017-06-29 DIAGNOSIS — R739 Hyperglycemia, unspecified: Secondary | ICD-10-CM | POA: Diagnosis not present

## 2017-06-29 LAB — SAMPLE TO BLOOD BANK

## 2017-06-29 LAB — CBC WITH DIFFERENTIAL/PLATELET
Basophils Absolute: 0 K/uL (ref 0–0.1)
Basophils Relative: 0 %
Eosinophils Absolute: 0 K/uL (ref 0–0.7)
Eosinophils Relative: 0 %
HCT: 21.6 % — ABNORMAL LOW (ref 40.0–52.0)
Hemoglobin: 7.5 g/dL — ABNORMAL LOW (ref 13.0–18.0)
Lymphocytes Relative: 26 %
Lymphs Abs: 1 K/uL (ref 1.0–3.6)
MCH: 33.9 pg (ref 26.0–34.0)
MCHC: 34.9 g/dL (ref 32.0–36.0)
MCV: 97.2 fL (ref 80.0–100.0)
Monocytes Absolute: 0.4 K/uL (ref 0.2–1.0)
Monocytes Relative: 10 %
Neutro Abs: 2.6 K/uL (ref 1.4–6.5)
Neutrophils Relative %: 64 %
Platelets: 123 K/uL — ABNORMAL LOW (ref 150–440)
RBC: 2.22 MIL/uL — ABNORMAL LOW (ref 4.40–5.90)
RDW: 24.1 % — ABNORMAL HIGH (ref 11.5–14.5)
WBC: 4 K/uL (ref 3.8–10.6)

## 2017-06-29 LAB — ABO/RH: ABO/RH(D): O POS

## 2017-06-29 LAB — FOLATE: FOLATE: 53 ng/mL (ref >5.9)

## 2017-06-29 LAB — COMPREHENSIVE METABOLIC PANEL
ALK PHOS: 387 U/L — AB (ref 38–126)
ALT: 29 U/L (ref 17–63)
ANION GAP: 10 (ref 5–15)
AST: 40 U/L (ref 15–41)
Albumin: 3.8 g/dL (ref 3.5–5.0)
BUN: 36 mg/dL — ABNORMAL HIGH (ref 6–20)
CALCIUM: 9.6 mg/dL (ref 8.9–10.3)
CO2: 20 mmol/L — AB (ref 22–32)
CREATININE: 1 mg/dL (ref 0.61–1.24)
Chloride: 104 mmol/L (ref 101–111)
Glucose, Bld: 157 mg/dL — ABNORMAL HIGH (ref 65–99)
Potassium: 4.9 mmol/L (ref 3.5–5.1)
SODIUM: 134 mmol/L — AB (ref 135–145)
TOTAL PROTEIN: 7.7 g/dL (ref 6.5–8.1)
Total Bilirubin: 0.2 mg/dL — ABNORMAL LOW (ref 0.3–1.2)

## 2017-06-29 LAB — IRON AND TIBC
IRON: 55 ug/dL (ref 45–182)
Saturation Ratios: 19 % (ref 17.9–39.5)
TIBC: 297 ug/dL (ref 250–450)
UIBC: 242 ug/dL

## 2017-06-29 LAB — VITAMIN B12: Vitamin B-12: 753 pg/mL (ref 180–914)

## 2017-06-29 LAB — PREPARE RBC (CROSSMATCH): Order Confirmation: POSITIVE

## 2017-06-29 LAB — FERRITIN: FERRITIN: 575 ng/mL — AB (ref 24–336)

## 2017-06-29 NOTE — Progress Notes (Signed)
Symptom Management Consult note [facility]  [phone] [phone]  Patient Care Team: Leone Haven, MD as PCP - General (Family Medicine) Kathyrn Drown, MD (Family Medicine) Clent Jacks, RN as Registered Nurse   Name of the patient: Thomas David  [id number]  [birth year]   Date of visit: 06/29/17  [place] of gallbladder  Chief complaint/ Reason for visit- Shortness of breath  Heme/Onc history: Patient last seen by primary medical oncologist [name] on 06/22/2017 with consideration of cycle 3, day 15 cisplatin and gemcitabine.  He received only gemcitabine.  Complained of significant weakness and fatigue but otherwise felt "okay".  Patient with initial diagnosis in January 2019 where he was admitted to the hospital for intermittent abdominal pain for several months.  This was thought to be related to his gallbladder he had surgery in October which was unsuccessful secondary to significant inflammation.  A drain was placed.  Had a cholecystectomy in December 2018 which revealed adenocarcinoma.  Had positive margins with disease on serosal surface.  Recent Pet scans from March 19, 2017 confirming stage IV disease with omental and liver metastasis.  Plan is to continue palliative chemotherapy using gemcitabine and cisplatin Day 1 and gemcitabine only on days 8 and 15.  Cisplatin has been dose reduced due to difficulties during cycle 1.  He is scheduled for restaging PET/CT on July 06, 2017.  He has now completed 3 cycles of dose reduced cisplatin and gemcitabine.  He is scheduled to return to clinic next week for restaging PET scans and consideration of cycle 4.  Interval history-  Patient complains of shortness of breath at rest.  Symptoms include dyspnea on exertion and shortness of breath. Symptoms began 5 days ago, gradually worsening since that time.  Patient denies difficulty breathing, dry cough, sputum  production and wheezing. Associated symptoms include shortness of breath with exertion. Patient has not had recent travel.  Weight has been stable.  Appetite has been decreased. Symptoms are exacerbated by any exercise. Symptoms are alleviated by rest.   ECOG FS:2 - Symptomatic, <50% confined to bed  Review of systems- Review of Systems  Constitutional: Positive for malaise/fatigue. Negative for chills, fever and weight loss.  HENT: Negative for congestion and ear pain.   Eyes: Negative.  Negative for blurred vision and double vision.  Respiratory: Positive for shortness of breath. Negative for cough and sputum production.   Cardiovascular: Negative.  Negative for chest pain, palpitations and leg swelling.  Gastrointestinal: Negative.  Negative for abdominal pain, constipation, diarrhea, nausea and vomiting.  Genitourinary: Negative for dysuria, frequency and urgency.  Musculoskeletal: Negative for back pain and falls.  Skin: Negative.  Negative for rash.  Neurological: Positive for weakness. Negative for headaches.  Endo/Heme/Allergies: Negative.  Does not bruise/bleed easily.  Psychiatric/Behavioral: Negative.  Negative for depression. The patient is not nervous/anxious and does not have insomnia.      Current treatment-  S/p Cycle 3 completed on 06/22/17.   No Known Allergies   Past Medical History:  Diagnosis Date  . Anxiety   . Arthritis   . Chronic lower back pain   . Crohn's disease ([place])   . Depression   . Difficult intubation    patient denies on 02/12/17  . GERD (gastroesophageal reflux disease)   . Gout   . History of kidney stones 40 years ago  . Hypercholesteremia   . Hypertension   . Prediabetes   . Sleep apnea  cpap      Past Surgical History:  Procedure Laterality Date  . CHOLECYSTECTOMY N/A 11/03/2016   Procedure: LAPAROSCOPIC CHOLECYSTostomy with tube placement ;  Surgeon: Johnathan Hausen, MD;  Location: [place] [facility];  Service: General;  Laterality: N/A;    . CHOLECYSTECTOMY N/A 02/15/2017   Procedure: LAPAROSCOPIC SUBTOTAL CHOLECYSTECTOMY, BIOPSY OF GALLBLADDER;  Surgeon: Johnathan Hausen, MD;  Location: [facility];  Service: General;  Laterality: N/A;  . COLECTOMY  1973   "part of large intestines" (05/04/2012)  . COLON RESECTION    . COLONOSCOPY    . COLONOSCOPY N/A 05/23/2014   Procedure: COLONOSCOPY;  Surgeon: Rogene Houston, MD;  Location: [facility];  Service: Endoscopy;  Laterality: N/A;  1200  . ESOPHAGEAL DILATION N/A 05/23/2014   Procedure: ESOPHAGEAL DILATION;  Surgeon: Rogene Houston, MD;  [facility];  Service: Endoscopy;  Laterality: N/A;  . ESOPHAGOGASTRODUODENOSCOPY N/A 03/16/2014   Procedure: esophageal dilation ;  Surgeon: Rogene Houston, MD;  [facility];  Service: Endoscopy;  Laterality: N/A;  . ESOPHAGOGASTRODUODENOSCOPY N/A 05/23/2014   Procedure: ESOPHAGOGASTRODUODENOSCOPY (EGD);  Surgeon: Rogene Houston, MD;  [facility];  Service: Endoscopy;  Laterality: N/A;  . ESOPHAGOGASTRODUODENOSCOPY (EGD) WITH PROPOFOL N/A 02/18/2017   Procedure: ESOPHAGOGASTRODUODENOSCOPY (EGD) WITH PROPOFOL;  Surgeon: Wonda Horner, MD;  Location: [facility];  Service: Endoscopy;  Laterality: N/A;  . [place]  . IR CHOLANGIOGRAM EXISTING TUBE  11/26/2016  . KIDNEY STONE SURGERY  1970's  . PARTIAL COLECTOMY  ~ 40 years ago   some of large and small  . PORTA CATH INSERTION N/A 03/17/2017   Procedure: PORTA CATH INSERTION;  Surgeon: Katha Cabal, MD;  Location: [place] [facility];  Service: Cardiovascular;  Laterality: N/A;  . PROSTATE SURGERY    . THYROIDECTOMY Right 05/04/2012   Procedure: RIGHT HEMI THYROIDECTOMY;  Surgeon: Ascencion Dike, MD;  Location: [place];  Service: ENT;  Laterality: Right;  . THYROIDECTOMY    . TONSILLECTOMY AND ADENOIDECTOMY     as a child  . TRANSURETHRAL PROSTATECTOMY WITH GYRUS INSTRUMENTS N/A 07/11/2012   Procedure: TRANSURETHRAL PROSTATECTOMY WITH GYRUS  INSTRUMENTS;  Surgeon: Claybon Jabs, MD;  Location: [facility];  Service: Urology;  Laterality: N/A;  . UPPER GASTROINTESTINAL ENDOSCOPY      Social History   Socioeconomic History  . Marital status: Widowed    Spouse name: Not on file  . Number of children: Not on file  . Years of education: Not on file  . Highest education level: Not on file  Occupational History  . Not on file  Social Needs  . Financial resource strain: Not on file  . Food insecurity:    Worry: Not on file    Inability: Not on file  . Transportation needs:    Medical: Not on file    Non-medical: Not on file  Tobacco Use  . Smoking status: Never Smoker  . Smokeless tobacco: Never Used  Substance and Sexual Activity  . Alcohol use: No    Alcohol/week: 0.0 oz  . Drug use: No  . Sexual activity: Never  Lifestyle  . Physical activity:    Days per week: Not on file    Minutes per session: Not on file  . Stress: Not on file  Relationships  . Social connections:    Talks on phone: Not on file    Gets together: Not on file    Attends religious service: Not on  file    Active member of club or organization: Not on file    Attends meetings of clubs or organizations: Not on file    Relationship status: Not on file  . Intimate partner violence:    Fear of current or ex partner: Not on file    Emotionally abused: Not on file    Physically abused: Not on file    Forced sexual activity: Not on file  Other Topics Concern  . Not on file  Social History Narrative   ** Merged History Encounter **        Family History  Problem Relation Age of Onset  . Diabetes Mother   . Stroke Father   . Healthy Daughter   . Healthy Son   . Heart disease Sister      Current Outpatient Medications:  .  allopurinol (ZYLOPRIM) 300 MG tablet, Take 1 tablet (300 mg total) by mouth every morning., Disp: 90 tablet, Rfl: 3 .  amLODipine (NORVASC) 5 MG tablet, TAKE 1 TABLET BY MOUTH  DAILY, Disp: 90 tablet, Rfl: 3 .  aspirin  EC 81 MG tablet, Take 81 mg by mouth at bedtime., Disp: , Rfl:  .  fentaNYL (DURAGESIC - DOSED MCG/HR) 25 MCG/HR patch, Place 1 patch (25 mcg total) onto the skin every 3 (three) days., Disp: 10 patch, Rfl: 0 .  lidocaine-prilocaine (EMLA) cream, Apply to port 1 hours prior to chemotherapy appointment. Cover with plastic wrap., Disp: 30 g, Rfl: 3 .  losartan (COZAAR) 100 MG tablet, Take 1 tablet (100 mg total) by mouth daily., Disp: 90 tablet, Rfl: 3 .  megestrol (MEGACE) 40 MG tablet, Take 1 tablet (40 mg total) by mouth daily., Disp: 30 tablet, Rfl: 2 .  mesalamine (PENTASA) 500 MG CR capsule, Take 1 capsule (500 mg total) by mouth 4 (four) times daily. (Patient taking differently: Take 1,000 mg by mouth 2 (two) times daily. ), Disp: 360 capsule, Rfl: 3 .  metoprolol tartrate (LOPRESSOR) 50 MG tablet, Take 1 tablet (50 mg total) by mouth 2 (two) times daily., Disp: 180 tablet, Rfl: 3 .  Multiple Vitamin (MULTIVITAMIN WITH MINERALS) TABS tablet, Take 2 tablets by mouth daily., Disp: , Rfl:  .  pantoprazole (PROTONIX) 40 MG tablet, Take 1 tablet (40 mg total) by mouth daily., Disp: 30 tablet, Rfl: 1 .  simvastatin (ZOCOR) 20 MG tablet, Take 1 tablet (20 mg total) by mouth daily., Disp: 90 tablet, Rfl: 3 .  vitamin B-12 (CYANOCOBALAMIN) 1000 MCG tablet, Take 1,000 mcg by mouth 2 (two) times daily., Disp: , Rfl:  .  acetaminophen (TYLENOL) 500 MG tablet, Take 1,000 mg by mouth every 4 (four) hours as needed for moderate pain or headache. , Disp: , Rfl:  .  bisacodyl (DULCOLAX) 10 MG suppository, Place 1 suppository (10 mg total) rectally daily as needed for moderate constipation. (Patient not taking: Reported on 06/15/2017), Disp: 12 suppository, Rfl: 0 .  fluticasone (FLONASE) 50 MCG/ACT nasal spray, USE 1 SPRAY INTO BOTH  NOSTRILS DAILY (Patient not taking: Reported on 06/15/2017), Disp: 32 g, Rfl: 0 .  Histamine Dihydrochloride (AUSTRALIAN DREAM ARTHRITIS EX), Apply 1 application topically 2 (two) times  daily as needed (back pain). , Disp: , Rfl:  .  HYDROcodone-acetaminophen (NORCO/VICODIN) 5-325 MG tablet, Take 1-2 tablets by mouth every 4 (four) hours as needed for moderate pain. (Patient not taking: Reported on 06/22/2017), Disp: 30 tablet, Rfl: 0 .  ondansetron (ZOFRAN) 8 MG tablet, Take 1 tablet (8 mg total) by  mouth 2 (two) times daily. (Patient not taking: Reported on 06/15/2017), Disp: 30 tablet, Rfl: 3 .  oxybutynin (DITROPAN-XL) 5 MG 24 hr tablet, Take 1 tablet (5 mg total) by mouth at bedtime. (Patient not taking: Reported on 06/22/2017), Disp: 90 tablet, Rfl: 1  Physical exam:  Vitals:   06/29/17 1335 06/29/17 1343  BP: 137/76   Pulse: 98   Resp: 20   Temp: (!) 97 F (36.1 C)   TempSrc: Tympanic   SpO2:  100%  Weight: 179 lb (81.2 kg)   Height: 5' 9"  (1.753 m)    Physical Exam  Constitutional: He is oriented to person, place, and time. Vital signs are normal. He appears well-developed.  HENT:  Head: Normocephalic and atraumatic.  Eyes: Pupils are equal, round, and reactive to light.  Neck: Normal range of motion.  Cardiovascular: Regular rhythm and normal heart sounds. Tachycardia present.  No murmur heard. Pulmonary/Chest: Effort normal and breath sounds normal. He has no wheezes.  Abdominal: Soft. Normal appearance and bowel sounds are normal. He exhibits no distension. There is no tenderness.  Musculoskeletal: Normal range of motion. He exhibits no edema.  Neurological: He is alert and oriented to person, place, and time.  Skin: Skin is warm and dry. No rash noted. There is pallor.  Psychiatric: Judgment normal.     CMP Latest Ref Rng & Units 06/29/2017  Glucose 65 - 99 mg/dL 157(H)  BUN 6 - 20 mg/dL 36(H)  Creatinine 0.61 - 1.24 mg/dL 1.00  Sodium 135 - 145 mmol/L 134(L)  Potassium 3.5 - 5.1 mmol/L 4.9  Chloride 101 - 111 mmol/L 104  CO2 22 - 32 mmol/L 20(L)  Calcium 8.9 - 10.3 mg/dL 9.6  Total Protein 6.5 - 8.1 g/dL 7.7  Total Bilirubin 0.3 - 1.2 mg/dL  0.2(L)  Alkaline Phos 38 - 126 U/L 387(H)  AST 15 - 41 U/L 40  ALT 17 - 63 U/L 29   CBC Latest Ref Rng & Units 06/29/2017  WBC 3.8 - 10.6 K/uL 4.0  Hemoglobin 13.0 - 18.0 g/dL 7.5(L)  Hematocrit 40.0 - 52.0 % 21.6(L)  Platelets 150 - 440 K/uL 123(L)    No images are attached to the encounter.  No results found.   Assessment and plan- Patient is a 82 y.o. male who presents with shortness of breath. He is pale and tachycardic.  Oxygen saturations remained 100% while ambulating.  Lungs are clear to auscultation.  1.  Adenocarcinoma of the gallbladder: S/p 3 cycles of cisplatin and gemcitabine.  Dose reduced cisplatin due to declining performance status and confusion.  Has tolerated dose reduced cisplatin and gemcitabine better.  Scheduled to have restaging PET scan on 07/06/17 and consideration for cycle 4 on 07/07/2017.  2.  Shortness of breath: D/t chemotherapy-induced anemia.  Hemoglobin today is 7.5 with hematocrit of 21.6. Oxygen saturations 100% while ambulating.  He is pale.  He is tachycardic.  Will transfuse 1 unit packed red blood cell first available.  Scheduled for first thing tomorrow morning.  Orders placed.  He has never had a blood transfusion before so appropriate labs have been ordered. Patient in agreement with plan.    Visit Diagnosis 1. Antineoplastic chemotherapy induced anemia   2. Anemia, unspecified type     Patient expressed understanding and was in agreement with this plan. He also understands that He can call clinic at any time with any questions, concerns, or complaints.   Greater than 50% was spent in counseling and coordination of care with  this patient including but not limited to discussion of the relevant topics above (See A&P) including, but not limited to diagnosis and management of acute and chronic medical conditions.    Faythe Casa, AGNP-C [facility] [place]- [phone] Pager- [phone] 06/29/2017 2:19 PM

## 2017-06-29 NOTE — Telephone Encounter (Signed)
Patient called and reports that his shortness of breath is getting worse and that his PET scan is not until next week. He is asking to be seen and evaluated for this today.  I discussed with Dr Grayland Ormond and he ordered lab and to see NP I returned call to patient he is in agreement to come in at 1 for lab and the see NP

## 2017-06-29 NOTE — Progress Notes (Signed)
Pt states sob on slightest exertion, fatigued, does not have a good appetite and is on megace but makes himself eat. Pt thinks maybe the patch he has on for pain might be causing his sob.

## 2017-06-30 ENCOUNTER — Inpatient Hospital Stay: Payer: Medicare Other

## 2017-06-30 DIAGNOSIS — M109 Gout, unspecified: Secondary | ICD-10-CM | POA: Diagnosis not present

## 2017-06-30 DIAGNOSIS — R0602 Shortness of breath: Secondary | ICD-10-CM | POA: Diagnosis not present

## 2017-06-30 DIAGNOSIS — R739 Hyperglycemia, unspecified: Secondary | ICD-10-CM | POA: Diagnosis not present

## 2017-06-30 DIAGNOSIS — R000 Tachycardia, unspecified: Secondary | ICD-10-CM | POA: Diagnosis not present

## 2017-06-30 DIAGNOSIS — R5383 Other fatigue: Secondary | ICD-10-CM | POA: Diagnosis not present

## 2017-06-30 DIAGNOSIS — E871 Hypo-osmolality and hyponatremia: Secondary | ICD-10-CM | POA: Diagnosis not present

## 2017-06-30 DIAGNOSIS — D696 Thrombocytopenia, unspecified: Secondary | ICD-10-CM | POA: Diagnosis not present

## 2017-06-30 DIAGNOSIS — K30 Functional dyspepsia: Secondary | ICD-10-CM | POA: Diagnosis not present

## 2017-06-30 DIAGNOSIS — I10 Essential (primary) hypertension: Secondary | ICD-10-CM | POA: Diagnosis not present

## 2017-06-30 DIAGNOSIS — C23 Malignant neoplasm of gallbladder: Secondary | ICD-10-CM | POA: Diagnosis not present

## 2017-06-30 DIAGNOSIS — M129 Arthropathy, unspecified: Secondary | ICD-10-CM | POA: Diagnosis not present

## 2017-06-30 DIAGNOSIS — M545 Low back pain: Secondary | ICD-10-CM | POA: Diagnosis not present

## 2017-06-30 DIAGNOSIS — E7800 Pure hypercholesterolemia, unspecified: Secondary | ICD-10-CM | POA: Diagnosis not present

## 2017-06-30 DIAGNOSIS — Z79899 Other long term (current) drug therapy: Secondary | ICD-10-CM | POA: Diagnosis not present

## 2017-06-30 DIAGNOSIS — D509 Iron deficiency anemia, unspecified: Secondary | ICD-10-CM | POA: Diagnosis not present

## 2017-06-30 DIAGNOSIS — Z5111 Encounter for antineoplastic chemotherapy: Secondary | ICD-10-CM | POA: Diagnosis not present

## 2017-06-30 DIAGNOSIS — C787 Secondary malignant neoplasm of liver and intrahepatic bile duct: Secondary | ICD-10-CM | POA: Diagnosis not present

## 2017-06-30 DIAGNOSIS — K5900 Constipation, unspecified: Secondary | ICD-10-CM | POA: Diagnosis not present

## 2017-06-30 DIAGNOSIS — K5090 Crohn's disease, unspecified, without complications: Secondary | ICD-10-CM | POA: Diagnosis not present

## 2017-06-30 DIAGNOSIS — G8929 Other chronic pain: Secondary | ICD-10-CM | POA: Diagnosis not present

## 2017-06-30 DIAGNOSIS — C786 Secondary malignant neoplasm of retroperitoneum and peritoneum: Secondary | ICD-10-CM | POA: Diagnosis not present

## 2017-06-30 DIAGNOSIS — D649 Anemia, unspecified: Secondary | ICD-10-CM

## 2017-06-30 DIAGNOSIS — K219 Gastro-esophageal reflux disease without esophagitis: Secondary | ICD-10-CM | POA: Diagnosis not present

## 2017-06-30 MED ORDER — ACETAMINOPHEN 325 MG PO TABS
650.0000 mg | ORAL_TABLET | Freq: Once | ORAL | Status: AC
  Administered 2017-06-30: 650 mg via ORAL
  Filled 2017-06-30: qty 2

## 2017-06-30 MED ORDER — DIPHENHYDRAMINE HCL 25 MG PO CAPS
25.0000 mg | ORAL_CAPSULE | Freq: Once | ORAL | Status: AC
  Administered 2017-06-30: 25 mg via ORAL
  Filled 2017-06-30: qty 1

## 2017-06-30 MED ORDER — HEPARIN SOD (PORK) LOCK FLUSH 100 UNIT/ML IV SOLN
500.0000 [IU] | Freq: Every day | INTRAVENOUS | Status: AC | PRN
  Administered 2017-06-30: 500 [IU]
  Filled 2017-06-30 (×2): qty 5

## 2017-06-30 MED ORDER — SODIUM CHLORIDE 0.9 % IV SOLN
250.0000 mL | Freq: Once | INTRAVENOUS | Status: AC
  Administered 2017-06-30: 250 mL via INTRAVENOUS
  Filled 2017-06-30: qty 250

## 2017-07-01 LAB — TYPE AND SCREEN
ABO/RH(D): O POS
ANTIBODY SCREEN: NEGATIVE
UNIT DIVISION: 0

## 2017-07-01 LAB — BPAM RBC
BLOOD PRODUCT EXPIRATION DATE: 201906012359
ISSUE DATE / TIME: 201905290943
UNIT TYPE AND RH: 5100

## 2017-07-05 ENCOUNTER — Other Ambulatory Visit: Payer: Self-pay | Admitting: Oncology

## 2017-07-06 ENCOUNTER — Encounter
Admission: RE | Admit: 2017-07-06 | Discharge: 2017-07-06 | Disposition: A | Discharge status: Home / Self Care | Payer: Medicare Other | Source: Ambulatory Visit | Attending: Oncology | Admitting: Oncology

## 2017-07-06 DIAGNOSIS — C23 Malignant neoplasm of gallbladder: Secondary | ICD-10-CM | POA: Diagnosis not present

## 2017-07-06 LAB — GLUCOSE, CAPILLARY: Glucose-Capillary: 87 mg/dL (ref 65–99)

## 2017-07-06 MED ORDER — FLUDEOXYGLUCOSE F - 18 (FDG) INJECTION
10.0000 | Freq: Once | INTRAVENOUS | Status: AC | PRN
  Administered 2017-07-06: 9.29 via INTRAVENOUS

## 2017-07-07 ENCOUNTER — Inpatient Hospital Stay (HOSPITAL_BASED_OUTPATIENT_CLINIC_OR_DEPARTMENT_OTHER): Payer: Medicare Other | Admitting: Oncology

## 2017-07-07 ENCOUNTER — Encounter: Payer: Self-pay | Admitting: Oncology

## 2017-07-07 ENCOUNTER — Inpatient Hospital Stay: Payer: Medicare Other

## 2017-07-07 ENCOUNTER — Inpatient Hospital Stay: Payer: Medicare Other | Attending: Oncology

## 2017-07-07 VITALS — BP 126/76 | HR 83 | Temp 97.5°F | Resp 20 | Wt 180.0 lb

## 2017-07-07 DIAGNOSIS — F418 Other specified anxiety disorders: Secondary | ICD-10-CM | POA: Insufficient documentation

## 2017-07-07 DIAGNOSIS — Z87442 Personal history of urinary calculi: Secondary | ICD-10-CM | POA: Insufficient documentation

## 2017-07-07 DIAGNOSIS — C23 Malignant neoplasm of gallbladder: Secondary | ICD-10-CM

## 2017-07-07 DIAGNOSIS — D6481 Anemia due to antineoplastic chemotherapy: Secondary | ICD-10-CM

## 2017-07-07 DIAGNOSIS — Z7982 Long term (current) use of aspirin: Secondary | ICD-10-CM | POA: Diagnosis not present

## 2017-07-07 DIAGNOSIS — E7800 Pure hypercholesterolemia, unspecified: Secondary | ICD-10-CM

## 2017-07-07 DIAGNOSIS — K5900 Constipation, unspecified: Secondary | ICD-10-CM

## 2017-07-07 DIAGNOSIS — Z5111 Encounter for antineoplastic chemotherapy: Secondary | ICD-10-CM

## 2017-07-07 DIAGNOSIS — Z79899 Other long term (current) drug therapy: Secondary | ICD-10-CM

## 2017-07-07 DIAGNOSIS — G8929 Other chronic pain: Secondary | ICD-10-CM

## 2017-07-07 DIAGNOSIS — E860 Dehydration: Secondary | ICD-10-CM | POA: Insufficient documentation

## 2017-07-07 DIAGNOSIS — C787 Secondary malignant neoplasm of liver and intrahepatic bile duct: Secondary | ICD-10-CM | POA: Insufficient documentation

## 2017-07-07 DIAGNOSIS — C786 Secondary malignant neoplasm of retroperitoneum and peritoneum: Secondary | ICD-10-CM | POA: Insufficient documentation

## 2017-07-07 DIAGNOSIS — K5090 Crohn's disease, unspecified, without complications: Secondary | ICD-10-CM

## 2017-07-07 DIAGNOSIS — G4730 Sleep apnea, unspecified: Secondary | ICD-10-CM | POA: Diagnosis not present

## 2017-07-07 DIAGNOSIS — R948 Abnormal results of function studies of other organs and systems: Secondary | ICD-10-CM | POA: Insufficient documentation

## 2017-07-07 DIAGNOSIS — R531 Weakness: Secondary | ICD-10-CM | POA: Insufficient documentation

## 2017-07-07 DIAGNOSIS — M129 Arthropathy, unspecified: Secondary | ICD-10-CM | POA: Diagnosis not present

## 2017-07-07 DIAGNOSIS — K219 Gastro-esophageal reflux disease without esophagitis: Secondary | ICD-10-CM

## 2017-07-07 DIAGNOSIS — I951 Orthostatic hypotension: Secondary | ICD-10-CM | POA: Diagnosis not present

## 2017-07-07 DIAGNOSIS — R5383 Other fatigue: Secondary | ICD-10-CM

## 2017-07-07 DIAGNOSIS — D649 Anemia, unspecified: Secondary | ICD-10-CM

## 2017-07-07 DIAGNOSIS — R110 Nausea: Secondary | ICD-10-CM | POA: Insufficient documentation

## 2017-07-07 DIAGNOSIS — R0602 Shortness of breath: Secondary | ICD-10-CM | POA: Insufficient documentation

## 2017-07-07 DIAGNOSIS — E875 Hyperkalemia: Secondary | ICD-10-CM | POA: Diagnosis not present

## 2017-07-07 LAB — CBC WITH DIFFERENTIAL/PLATELET
BASOS ABS: 0 10*3/uL (ref 0–0.1)
Basophils Relative: 1 %
Eosinophils Absolute: 0.1 10*3/uL (ref 0–0.7)
Eosinophils Relative: 2 %
HCT: 26.2 % — ABNORMAL LOW (ref 40.0–52.0)
Hemoglobin: 8.9 g/dL — ABNORMAL LOW (ref 13.0–18.0)
LYMPHS ABS: 1 10*3/uL (ref 1.0–3.6)
LYMPHS PCT: 14 %
MCH: 34.4 pg — AB (ref 26.0–34.0)
MCHC: 34 g/dL (ref 32.0–36.0)
MCV: 101.2 fL — AB (ref 80.0–100.0)
MONO ABS: 1 10*3/uL (ref 0.2–1.0)
MONOS PCT: 15 %
Neutro Abs: 4.9 10*3/uL (ref 1.4–6.5)
Neutrophils Relative %: 68 %
Platelets: 309 10*3/uL (ref 150–440)
RBC: 2.59 MIL/uL — ABNORMAL LOW (ref 4.40–5.90)
RDW: 26.1 % — AB (ref 11.5–14.5)
WBC: 7.1 10*3/uL (ref 3.8–10.6)

## 2017-07-07 LAB — COMPREHENSIVE METABOLIC PANEL
ALBUMIN: 3.5 g/dL (ref 3.5–5.0)
ALT: 34 U/L (ref 17–63)
AST: 44 U/L — AB (ref 15–41)
Alkaline Phosphatase: 309 U/L — ABNORMAL HIGH (ref 38–126)
Anion gap: 11 (ref 5–15)
BILIRUBIN TOTAL: 0.5 mg/dL (ref 0.3–1.2)
BUN: 38 mg/dL — AB (ref 6–20)
CHLORIDE: 110 mmol/L (ref 101–111)
CO2: 17 mmol/L — ABNORMAL LOW (ref 22–32)
CREATININE: 1.12 mg/dL (ref 0.61–1.24)
Calcium: 8.9 mg/dL (ref 8.9–10.3)
GFR calc Af Amer: >60 mL/min (ref >60)
GFR calc non Af Amer: >60 mL/min (ref >60)
GLUCOSE: 155 mg/dL — AB (ref 65–99)
POTASSIUM: 4.6 mmol/L (ref 3.5–5.1)
Sodium: 138 mmol/L (ref 135–145)
Total Protein: 6.8 g/dL (ref 6.5–8.1)

## 2017-07-07 MED ORDER — SODIUM CHLORIDE 0.9% FLUSH
10.0000 mL | INTRAVENOUS | Status: DC | PRN
  Filled 2017-07-07: qty 10

## 2017-07-07 MED ORDER — SODIUM CHLORIDE 0.9% FLUSH
10.0000 mL | INTRAVENOUS | Status: DC | PRN
  Administered 2017-07-07: 10 mL via INTRAVENOUS
  Filled 2017-07-07: qty 10

## 2017-07-07 MED ORDER — HEPARIN SOD (PORK) LOCK FLUSH 100 UNIT/ML IV SOLN
500.0000 [IU] | Freq: Once | INTRAVENOUS | Status: AC
  Administered 2017-07-07: 500 [IU] via INTRAVENOUS

## 2017-07-07 MED ORDER — SODIUM CHLORIDE 0.9 % IV SOLN
Freq: Once | INTRAVENOUS | Status: AC
  Administered 2017-07-07: 10:00:00 via INTRAVENOUS
  Filled 2017-07-07: qty 1000

## 2017-07-07 MED ORDER — PALONOSETRON HCL INJECTION 0.25 MG/5ML
0.2500 mg | Freq: Once | INTRAVENOUS | Status: AC
  Administered 2017-07-07: 0.25 mg via INTRAVENOUS
  Filled 2017-07-07: qty 5

## 2017-07-07 MED ORDER — HEPARIN SOD (PORK) LOCK FLUSH 100 UNIT/ML IV SOLN
500.0000 [IU] | Freq: Once | INTRAVENOUS | Status: DC | PRN
  Filled 2017-07-07: qty 5

## 2017-07-07 MED ORDER — SODIUM CHLORIDE 0.9 % IV SOLN
40.0000 mg/m2 | Freq: Once | INTRAVENOUS | Status: AC
  Administered 2017-07-07: 84 mg via INTRAVENOUS
  Filled 2017-07-07: qty 84

## 2017-07-07 MED ORDER — POTASSIUM CHLORIDE 2 MEQ/ML IV SOLN
Freq: Once | INTRAVENOUS | Status: AC
  Administered 2017-07-07: 10:00:00 via INTRAVENOUS
  Filled 2017-07-07: qty 1000

## 2017-07-07 MED ORDER — SODIUM CHLORIDE 0.9 % IV SOLN
2000.0000 mg | Freq: Once | INTRAVENOUS | Status: AC
  Administered 2017-07-07: 2000 mg via INTRAVENOUS
  Filled 2017-07-07: qty 52.6

## 2017-07-07 MED ORDER — LACTULOSE 10 G PO PACK: 10.0000 g | PACK | ORAL | 0 refills | Status: DC | PRN

## 2017-07-07 MED ORDER — SODIUM CHLORIDE 0.9 % IV SOLN
Freq: Once | INTRAVENOUS | Status: AC
  Administered 2017-07-07: 12:00:00 via INTRAVENOUS
  Filled 2017-07-07: qty 5

## 2017-07-07 NOTE — Progress Notes (Signed)
[place]  Telephone:([phone]) [phone] Fax:(336) [phone]  ID: Thomas David OB: 1936/01/25  MR#: [id number]  LSL#:[id number]  Patient Care Team: Leone Haven, MD as PCP - General (Family Medicine) Kathyrn Drown, MD (Family Medicine) Clent Jacks, RN as Registered Nurse  CHIEF COMPLAINT: Adenocarcinoma of gallbladder.  Oncology History   Patient with initial diagnosis in January 2019 where he was admitted to the hospital for intermittent abdominal pain for several months.  This was thought to be related to his gallbladder he had surgery in October which was unsuccessful secondary to significant inflammation.  A drain was placed.  Had a cholecystectomy in December 2018 which revealed adenocarcinoma.  Had positive margins with disease on serosal surface.  Pet scans from March 19, 2017 confirming stage IV disease with omental and liver metastasis.  Plan is to continue palliative chemotherapy using gemcitabine and cisplatin Day 1 and gemcitabine only on days 8 and 15.  Cisplatin has been dose reduced due to difficulties during cycle 1.  He is scheduled for restaging PET/CT on July 06, 2017.  Pet Scan from 07/06/17 revealed improvement of hepatic metastasis decrease in metabolic activity of right adrenal gland metastasis and resolution of metabolic activity and left adrenal gland metastasis mild decrease in metabolic activity of omental metastasis disease.  Several new small pulmonary nodules in left lower lobe hypermetabolic.     Adenocarcinoma of gallbladder ([place])   02/19/2017 Initial Diagnosis    Adenocarcinoma of gallbladder ([place])      INTERVAL HISTORY: Patient further evaluation and consideration of cycle 4 of cisplatin and gemcitabine.  Recently had PET scan to assess response to current chemotherapy.  Patient is feeling significantly better after receiving 1 unit packed blood cells last week.  Patient states over the past few days he has felt the best he is  felt in a while.  His appetite has improved he denies any neurological complaints, recent fevers or illnesses, or recurrent shortness of breath.  Patient states has chronic bowel concerns.  States 1 day he has "loose stools" and other days he feels "constipated".  Feels best when he has 2 bowel movements daily.  Is requesting something additional for his bowel regimen of MiraLAX and Colace daily.  He was seen last week in symptom management for chemotherapy-induced anemia.  He received 1 unit packed red blood cells.  Tolerated well.   REVIEW OF SYSTEMS:   Review of Systems  Constitutional: Positive for malaise/fatigue. Negative for chills, fever and weight loss.  HENT: Negative for congestion and ear pain.   Eyes: Negative.  Negative for blurred vision and double vision.  Respiratory: Positive for shortness of breath (Much improved). Negative for cough and sputum production.   Cardiovascular: Negative.  Negative for chest pain, palpitations and leg swelling.  Gastrointestinal: Negative.  Negative for abdominal pain, constipation, diarrhea, nausea and vomiting.  Genitourinary: Negative for dysuria, frequency and urgency.  Musculoskeletal: Negative for back pain and falls.  Skin: Negative.  Negative for rash.  Neurological: Negative.  Negative for weakness and headaches.  Endo/Heme/Allergies: Negative.  Does not bruise/bleed easily.  Psychiatric/Behavioral: Negative.  Negative for depression. The patient is not nervous/anxious and does not have insomnia.     As per HPI. Otherwise, a complete review of systems is negative.  PAST MEDICAL HISTORY: Past Medical History:  Diagnosis Date  . Anxiety   . Arthritis   . Chronic lower back pain   . Crohn's disease ([place])   . Depression   .  Difficult intubation    patient denies on 02/12/17  . GERD (gastroesophageal reflux disease)   . Gout   . History of kidney stones 40 years ago  . Hypercholesteremia   . Hypertension   . Prediabetes   .  Sleep apnea    cpap     PAST SURGICAL HISTORY: Past Surgical History:  Procedure Laterality Date  . CHOLECYSTECTOMY N/A 11/03/2016   Procedure: LAPAROSCOPIC CHOLECYSTostomy with tube placement ;  Surgeon: Johnathan Hausen, MD;  Location: [facility];  Service: General;  Laterality: N/A;  . CHOLECYSTECTOMY N/A 02/15/2017   Procedure: LAPAROSCOPIC SUBTOTAL CHOLECYSTECTOMY, BIOPSY OF GALLBLADDER;  Surgeon: Johnathan Hausen, MD;  Location: [facility];  Service: General;  Laterality: N/A;  . COLECTOMY  1973   "part of large intestines" (05/04/2012)  . COLON RESECTION    . COLONOSCOPY    . COLONOSCOPY N/A 05/23/2014   Procedure: COLONOSCOPY;  Surgeon: Rogene Houston, MD;  [facility]: [facility];  Service: Endoscopy;  Laterality: N/A;  1200  . ESOPHAGEAL DILATION N/A 05/23/2014   Procedure: ESOPHAGEAL DILATION;  Surgeon: Rogene Houston, MD;  [facility];  Service: Endoscopy;  Laterality: N/A;  . ESOPHAGOGASTRODUODENOSCOPY N/A 03/16/2014   Procedure: esophageal dilation ;  Surgeon: Rogene Houston, MD;  [facility];  Service: Endoscopy;  Laterality: N/A;  . ESOPHAGOGASTRODUODENOSCOPY N/A 05/23/2014   Procedure: ESOPHAGOGASTRODUODENOSCOPY (EGD);  Surgeon: Rogene Houston, MD;  [facility];  Service: Endoscopy;  Laterality: N/A;  . ESOPHAGOGASTRODUODENOSCOPY (EGD) WITH PROPOFOL N/A 02/18/2017   Procedure: ESOPHAGOGASTRODUODENOSCOPY (EGD) WITH PROPOFOL;  Surgeon: Wonda Horner, MD;  Location: [facility];  Service: Endoscopy;  Laterality: N/A;  . [place]  . IR CHOLANGIOGRAM EXISTING TUBE  11/26/2016  . KIDNEY STONE SURGERY  1970's  . PARTIAL COLECTOMY  ~ 40 years ago   some of large and small  . PORTA CATH INSERTION N/A 03/17/2017   Procedure: PORTA CATH INSERTION;  Surgeon: Katha Cabal, MD;  Location: [facility];  Service: Cardiovascular;  Laterality: N/A;  . PROSTATE SURGERY    . THYROIDECTOMY Right 05/04/2012   Procedure: RIGHT HEMI  THYROIDECTOMY;  Surgeon: Ascencion Dike, MD;  Location: [facility];  Service: ENT;  Laterality: Right;  . THYROIDECTOMY    . TONSILLECTOMY AND ADENOIDECTOMY     as a child  . TRANSURETHRAL PROSTATECTOMY WITH GYRUS INSTRUMENTS N/A 07/11/2012   Procedure: TRANSURETHRAL PROSTATECTOMY WITH GYRUS INSTRUMENTS;  Surgeon: Claybon Jabs, MD;  Location: [facility];  Service: Urology;  Laterality: N/A;  . UPPER GASTROINTESTINAL ENDOSCOPY      FAMILY HISTORY: Family History  Problem Relation Age of Onset  . Diabetes Mother   . Stroke Father   . Healthy Daughter   . Healthy Son   . Heart disease Sister     ADVANCED DIRECTIVES (Y/N):  N  HEALTH MAINTENANCE: Social History   Tobacco Use  . Smoking status: Never Smoker  . Smokeless tobacco: Never Used  Substance Use Topics  . Alcohol use: No    Alcohol/week: 0.0 oz  . Drug use: No     Colonoscopy:  PAP:  Bone density:  Lipid panel:  No Known Allergies  Current Outpatient Medications  Medication Sig Dispense Refill  . acetaminophen (TYLENOL) 500 MG tablet Take 1,000 mg by mouth every 4 (four) hours as needed for moderate pain or headache.     . allopurinol (ZYLOPRIM) 300 MG tablet Take 1 tablet (300 mg total) by  mouth every morning. 90 tablet 3  . amLODipine (NORVASC) 5 MG tablet TAKE 1 TABLET BY MOUTH  DAILY 90 tablet 3  . aspirin EC 81 MG tablet Take 81 mg by mouth at bedtime.    . bisacodyl (DULCOLAX) 10 MG suppository Place 1 suppository (10 mg total) rectally daily as needed for moderate constipation. 12 suppository 0  . fentaNYL (DURAGESIC - DOSED MCG/HR) 25 MCG/HR patch Place 1 patch (25 mcg total) onto the skin every 3 (three) days. 10 patch 0  . Histamine Dihydrochloride (AUSTRALIAN DREAM ARTHRITIS EX) Apply 1 application topically 2 (two) times daily as needed (back pain).     Marland Kitchen HYDROcodone-acetaminophen (NORCO/VICODIN) 5-325 MG tablet Take 1-2 tablets by mouth every 4 (four) hours as needed for moderate pain. 30 tablet 0  .  lidocaine-prilocaine (EMLA) cream Apply to port 1 hours prior to chemotherapy appointment. Cover with plastic wrap. 30 g 3  . losartan (COZAAR) 100 MG tablet Take 1 tablet (100 mg total) by mouth daily. 90 tablet 3  . megestrol (MEGACE) 40 MG tablet Take 1 tablet (40 mg total) by mouth daily. 30 tablet 2  . mesalamine (PENTASA) 500 MG CR capsule Take 1 capsule (500 mg total) by mouth 4 (four) times daily. (Patient taking differently: Take 1,000 mg by mouth 2 (two) times daily. ) 360 capsule 3  . metoprolol tartrate (LOPRESSOR) 50 MG tablet Take 1 tablet (50 mg total) by mouth 2 (two) times daily. 180 tablet 3  . Multiple Vitamin (MULTIVITAMIN WITH MINERALS) TABS tablet Take 2 tablets by mouth daily.    . pantoprazole (PROTONIX) 40 MG tablet Take 1 tablet (40 mg total) by mouth daily. 30 tablet 1  . simvastatin (ZOCOR) 20 MG tablet Take 1 tablet (20 mg total) by mouth daily. 90 tablet 3  . vitamin B-12 (CYANOCOBALAMIN) 1000 MCG tablet Take 1,000 mcg by mouth 2 (two) times daily.    . fluticasone (FLONASE) 50 MCG/ACT nasal spray USE 1 SPRAY INTO BOTH  NOSTRILS DAILY (Patient not taking: Reported on 06/15/2017) 32 g 0  . ondansetron (ZOFRAN) 8 MG tablet Take 1 tablet (8 mg total) by mouth 2 (two) times daily. (Patient not taking: Reported on 07/07/2017) 30 tablet 3  . oxybutynin (DITROPAN-XL) 5 MG 24 hr tablet Take 1 tablet (5 mg total) by mouth at bedtime. (Patient not taking: Reported on 06/22/2017) 90 tablet 1   No current facility-administered medications for this visit.    Facility-Administered Medications Ordered in Other Visits  Medication Dose Route Frequency Provider Last Rate Last Dose  . CISplatin (PLATINOL) 84 mg in sodium chloride 0.9 % 250 mL chemo infusion  40 mg/m2 (Treatment Plan Recorded) Intravenous Once Lloyd Huger, MD      . gemcitabine (GEMZAR) 2,000 mg in sodium chloride 0.9 % 250 mL chemo infusion  2,000 mg Intravenous Once Lloyd Huger, MD      . heparin lock flush  100 unit/mL  500 Units Intravenous Once Lloyd Huger, MD      . heparin lock flush 100 unit/mL  500 Units Intracatheter Once PRN Lloyd Huger, MD      . sodium chloride flush (NS) 0.9 % injection 10 mL  10 mL Intravenous PRN Lloyd Huger, MD   10 mL at 07/07/17 5573  . sodium chloride flush (NS) 0.9 % injection 10 mL  10 mL Intracatheter PRN Lloyd Huger, MD        OBJECTIVE: Vitals:   07/07/17 0900  BP:  126/76  Pulse: 83  Resp: 20  Temp: (!) 97.5 F (36.4 C)     Body mass index is 26.58 kg/m.    ECOG FS:1 - Symptomatic but completely ambulatory  Physical Exam  Constitutional: He is oriented to person, place, and time. Vital signs are normal. He appears well-developed and well-nourished.  HENT:  Head: Normocephalic and atraumatic.  Eyes: Pupils are equal, round, and reactive to light.  Neck: Normal range of motion.  Cardiovascular: Normal rate and regular rhythm.  No murmur heard. Pulmonary/Chest: Effort normal and breath sounds normal. He has no wheezes.  Abdominal: Soft. Bowel sounds are normal. He exhibits no distension and no mass. There is no tenderness.  Musculoskeletal: Normal range of motion. He exhibits no edema.  Neurological: He is alert and oriented to person, place, and time.  Skin: Skin is warm and dry.  Psychiatric: He has a normal mood and affect. His behavior is normal.    LAB RESULTS:  Lab Results  Component Value Date   NA 138 07/07/2017   K 4.6 07/07/2017   CL 110 07/07/2017   CO2 17 (L) 07/07/2017   GLUCOSE 155 (H) 07/07/2017   BUN 38 (H) 07/07/2017   CREATININE 1.12 07/07/2017   CALCIUM 8.9 07/07/2017   PROT 6.8 07/07/2017   ALBUMIN 3.5 07/07/2017   AST 44 (H) 07/07/2017   ALT 34 07/07/2017   ALKPHOS 309 (H) 07/07/2017   BILITOT 0.5 07/07/2017   GFRNONAA >60 07/07/2017   GFRAA >60 07/07/2017    Lab Results  Component Value Date   WBC 7.1 07/07/2017   NEUTROABS 4.9 07/07/2017   HGB 8.9 (L) 07/07/2017   HCT  26.2 (L) 07/07/2017   MCV 101.2 (H) 07/07/2017   PLT 309 07/07/2017     STUDIES: Nm Pet Image Restag (ps) Skull Base To Thigh  Result Date: 07/06/2017 CLINICAL DATA:  Subsequent treatment strategy for gallbladder carcinoma. EXAM: NUCLEAR MEDICINE PET SKULL BASE TO THIGH TECHNIQUE: 9.3 mCi F-18 FDG was injected intravenously. Full-ring PET imaging was performed from the skull base to thigh after the radiotracer. CT data was obtained and used for attenuation correction and anatomic localization. Fasting blood glucose: 87 mg/dl COMPARISON:  PET-CT 03/19/2017, CT 09/17/2016 FINDINGS: Mediastinal blood pool activity: SUV max 2.2 NECK: No hypermetabolic lymph nodes in the neck. Incidental CT findings: none CHEST: Decrease in nodularity in the RIGHT upper lobe. 6 mm nodule decreased from 7 mm (image 79/3). Band of atelectatic thickening in the RIGHT middle lobe is also unchanged. No hypermetabolic pulmonary nodules. Small nodule in the LEFT lower lobe measures 4 mm (image 114/3) which is not clearly seen on prior. There are two additional nodules adjacent which are new (image 111/3. Incidental CT findings: Coronary artery calcification and aortic atherosclerotic calcification. ABDOMEN/PELVIS: Interval decrease in the metabolic activity of hepatic metastasis in the LEFT hepatic lobe. No discrete metabolic activity above background liver activity. There is interval decrease in metabolic activity enlarged RIGHT adrenal gland with SUV max equal 5.8 decreased from 8.1. Near complete resolution of metabolic activity in the LEFT adrenal gland. The hypermetabolic omental thickening in the RIGHT upper quadrant is mildly decreased SUV max equal 4.0 decreased from 6.4. visually the thickening is similar (image 182/3) Incidental CT findings: Large prostate gland. Atherosclerotic calcification of the aorta. Extensive renal cysts without metabolic activity SKELETON: No focal hypermetabolic activity to suggest skeletal  metastasis. Incidental CT findings: none IMPRESSION: 1. Improvement hepatic metastasis with no discrete lesion above liver background metabolic activity. 2.  Decrease in metabolic activity of RIGHT adrenal gland metastasis and resolution of metabolic activity in LEFT adrenal gland metastasis. 3. Mild decrease in metabolic activity of omental metastatic disease in the RIGHT upper quadrant with similar omental thickening. 4. Several new small pulmonary nodules in the LEFT lower lobe medially are indeterminate. Recommend attention on follow-up Electronically Signed   By: Suzy Bouchard M.D.   On: 07/06/2017 11:36    ASSESSMENT: Adenocarcinoma of gallbladder.  PLAN:    1. Adenocarcinoma of gallbladder: PET scan results from 07/06/2017 reviewed with patient and family members today.  PET scan revealed improvement of hepatic metastasis decrease in metabolic activity of right adrenal gland metastasis and resolution of metabolic activity and left adrenal gland metastasis mild decrease in metabolic activity of omental metastasis disease.  Several new small pulmonary nodules in left lower lobe hypermetabolic.  Patient and family members had questions regarding results.  Answered questions in great detail.  Provided a copy of to family members so they can review.  Per Dr. Gary Fleet previous notes, we will continue with palliative chemotherapy using gemcitabine cisplatin on day 1 gemcitabine only on days 8 and 15.  We will continue to be a 28-day cycle.  Has been dose reduced due to altered mental status and dehydration with cycle 1.  Proceed today with cycle 4-day 1 of cisplatin and gemcitabine and gemcitabine only on days 2 and 3.  2.  Poor appetite: Improved.  Weight up 1 pound.   3.  Pain: Mild right lower back pain.  Chronic and unchanged.  Continue fentanyl patch and hydrocodone as prescribed. 4.  Constipation: Continue Dulcolax and MiraLAX as needed.  We will add an lactulose when patient is constipated.   Explained to patient how to use this medication.  5.  Elevated liver enzymes: Mildly elevated AST today. 44. Continue to monitor.  6.  Anemia: Hemoglobin today 8.9.  Received 1 unit packed red blood cells last week for hemoglobin of 7.5.  Patient was symptomatic. Will add hold tube during his next visit for possible blood. 7.  Fatigue: Multifactorial.  Patient was previously given a referral to the CARE program.  Patient expressed understanding and was in agreement with this plan. He also understands that He can call clinic at any time with any questions, concerns, or complaints.   Cancer Staging Adenocarcinoma of gallbladder [facility]) Staging form: Gallbladder, AJCC 8th Edition - Clinical stage from 03/03/2017: Stage IVB (cT3, cN0, cM1) - Signed by Lloyd Huger, MD on 03/23/2017  Greater than 50% was spent in counseling and coordination of care with this patient including but not limited to discussion of the relevant topics above (See A&P) including, but not limited to diagnosis and management of acute and chronic medical conditions.   Jacquelin Hawking, NP   07/07/2017 12:25 PM

## 2017-07-07 NOTE — Progress Notes (Signed)
Patient here today for follow up and treatment consideration regarding gallbladder cancer. Patient had PET scan yesterday, reports back pain over the past 2 days. Pain has improved with pain medications. Patient continues to have constipation, taking stool softener, miralax and suppositories as needed.

## 2017-07-12 ENCOUNTER — Telehealth: Payer: Self-pay | Admitting: *Deleted

## 2017-07-12 ENCOUNTER — Ambulatory Visit
Admission: RE | Admit: 2017-07-12 | Discharge: 2017-07-12 | Disposition: A | Discharge status: Home / Self Care | Payer: Medicare Other | Source: Ambulatory Visit | Attending: Oncology | Admitting: Oncology

## 2017-07-12 ENCOUNTER — Ambulatory Visit: Payer: Medicare Other

## 2017-07-12 ENCOUNTER — Encounter: Payer: Self-pay | Admitting: Oncology

## 2017-07-12 ENCOUNTER — Other Ambulatory Visit: Payer: Self-pay

## 2017-07-12 ENCOUNTER — Inpatient Hospital Stay (HOSPITAL_BASED_OUTPATIENT_CLINIC_OR_DEPARTMENT_OTHER): Payer: Medicare Other | Admitting: Oncology

## 2017-07-12 ENCOUNTER — Inpatient Hospital Stay: Payer: Medicare Other

## 2017-07-12 ENCOUNTER — Telehealth: Payer: Self-pay

## 2017-07-12 VITALS — BP 112/64 | HR 86 | Temp 98.8°F | Resp 22

## 2017-07-12 DIAGNOSIS — Z5111 Encounter for antineoplastic chemotherapy: Secondary | ICD-10-CM

## 2017-07-12 DIAGNOSIS — I951 Orthostatic hypotension: Secondary | ICD-10-CM | POA: Diagnosis not present

## 2017-07-12 DIAGNOSIS — Z87442 Personal history of urinary calculi: Secondary | ICD-10-CM

## 2017-07-12 DIAGNOSIS — R531 Weakness: Secondary | ICD-10-CM

## 2017-07-12 DIAGNOSIS — R110 Nausea: Secondary | ICD-10-CM | POA: Diagnosis not present

## 2017-07-12 DIAGNOSIS — G4730 Sleep apnea, unspecified: Secondary | ICD-10-CM

## 2017-07-12 DIAGNOSIS — E860 Dehydration: Secondary | ICD-10-CM | POA: Diagnosis not present

## 2017-07-12 DIAGNOSIS — C786 Secondary malignant neoplasm of retroperitoneum and peritoneum: Secondary | ICD-10-CM

## 2017-07-12 DIAGNOSIS — C23 Malignant neoplasm of gallbladder: Secondary | ICD-10-CM | POA: Diagnosis not present

## 2017-07-12 DIAGNOSIS — E875 Hyperkalemia: Secondary | ICD-10-CM | POA: Diagnosis not present

## 2017-07-12 DIAGNOSIS — N281 Cyst of kidney, acquired: Secondary | ICD-10-CM | POA: Diagnosis not present

## 2017-07-12 DIAGNOSIS — R918 Other nonspecific abnormal finding of lung field: Secondary | ICD-10-CM | POA: Diagnosis not present

## 2017-07-12 DIAGNOSIS — I700 Atherosclerosis of aorta: Secondary | ICD-10-CM | POA: Diagnosis not present

## 2017-07-12 DIAGNOSIS — F418 Other specified anxiety disorders: Secondary | ICD-10-CM

## 2017-07-12 DIAGNOSIS — R948 Abnormal results of function studies of other organs and systems: Secondary | ICD-10-CM | POA: Diagnosis not present

## 2017-07-12 DIAGNOSIS — R0602 Shortness of breath: Secondary | ICD-10-CM

## 2017-07-12 DIAGNOSIS — Z7982 Long term (current) use of aspirin: Secondary | ICD-10-CM | POA: Diagnosis not present

## 2017-07-12 DIAGNOSIS — K5090 Crohn's disease, unspecified, without complications: Secondary | ICD-10-CM

## 2017-07-12 DIAGNOSIS — E7800 Pure hypercholesterolemia, unspecified: Secondary | ICD-10-CM | POA: Diagnosis not present

## 2017-07-12 DIAGNOSIS — D649 Anemia, unspecified: Secondary | ICD-10-CM

## 2017-07-12 DIAGNOSIS — I2510 Atherosclerotic heart disease of native coronary artery without angina pectoris: Secondary | ICD-10-CM | POA: Diagnosis not present

## 2017-07-12 DIAGNOSIS — G8929 Other chronic pain: Secondary | ICD-10-CM | POA: Diagnosis not present

## 2017-07-12 DIAGNOSIS — E785 Hyperlipidemia, unspecified: Secondary | ICD-10-CM | POA: Diagnosis not present

## 2017-07-12 DIAGNOSIS — K5900 Constipation, unspecified: Secondary | ICD-10-CM

## 2017-07-12 DIAGNOSIS — M129 Arthropathy, unspecified: Secondary | ICD-10-CM

## 2017-07-12 DIAGNOSIS — C787 Secondary malignant neoplasm of liver and intrahepatic bile duct: Secondary | ICD-10-CM

## 2017-07-12 DIAGNOSIS — K219 Gastro-esophageal reflux disease without esophagitis: Secondary | ICD-10-CM | POA: Diagnosis not present

## 2017-07-12 DIAGNOSIS — Z95828 Presence of other vascular implants and grafts: Secondary | ICD-10-CM

## 2017-07-12 DIAGNOSIS — D6481 Anemia due to antineoplastic chemotherapy: Secondary | ICD-10-CM | POA: Diagnosis not present

## 2017-07-12 DIAGNOSIS — Z79899 Other long term (current) drug therapy: Secondary | ICD-10-CM | POA: Diagnosis not present

## 2017-07-12 DIAGNOSIS — R5383 Other fatigue: Secondary | ICD-10-CM

## 2017-07-12 LAB — COMPREHENSIVE METABOLIC PANEL
ALBUMIN: 3.7 g/dL (ref 3.5–5.0)
ALK PHOS: 251 U/L — AB (ref 38–126)
ALT: 32 U/L (ref 17–63)
AST: 34 U/L (ref 15–41)
Anion gap: 9 (ref 5–15)
BUN: 43 mg/dL — AB (ref 6–20)
CALCIUM: 9.6 mg/dL (ref 8.9–10.3)
CO2: 17 mmol/L — AB (ref 22–32)
CREATININE: 1.24 mg/dL (ref 0.61–1.24)
Chloride: 108 mmol/L (ref 101–111)
GFR calc Af Amer: >60 mL/min (ref >60)
GFR calc non Af Amer: 54 mL/min — ABNORMAL LOW (ref >60)
GLUCOSE: 168 mg/dL — AB (ref 65–99)
Potassium: 5.9 mmol/L — ABNORMAL HIGH (ref 3.5–5.1)
Sodium: 134 mmol/L — ABNORMAL LOW (ref 135–145)
Total Bilirubin: 0.3 mg/dL (ref 0.3–1.2)
Total Protein: 7.1 g/dL (ref 6.5–8.1)

## 2017-07-12 LAB — CBC WITH DIFFERENTIAL/PLATELET
Basophils Absolute: 0 10*3/uL (ref 0–0.1)
Basophils Relative: 1 %
EOS ABS: 0 10*3/uL (ref 0–0.7)
Eosinophils Relative: 1 %
HCT: 27.2 % — ABNORMAL LOW (ref 40.0–52.0)
Hemoglobin: 9.5 g/dL — ABNORMAL LOW (ref 13.0–18.0)
Lymphocytes Relative: 23 %
Lymphs Abs: 0.9 10*3/uL — ABNORMAL LOW (ref 1.0–3.6)
MCH: 35.7 pg — AB (ref 26.0–34.0)
MCHC: 34.8 g/dL (ref 32.0–36.0)
MCV: 102.6 fL — ABNORMAL HIGH (ref 80.0–100.0)
MONO ABS: 0.2 10*3/uL (ref 0.2–1.0)
MONOS PCT: 6 %
Neutro Abs: 2.7 10*3/uL (ref 1.4–6.5)
Neutrophils Relative %: 69 %
Platelets: 309 10*3/uL (ref 150–440)
RBC: 2.65 MIL/uL — ABNORMAL LOW (ref 4.40–5.90)
RDW: 23.1 % — AB (ref 11.5–14.5)
WBC: 3.9 10*3/uL (ref 3.8–10.6)

## 2017-07-12 LAB — SAMPLE TO BLOOD BANK

## 2017-07-12 MED ORDER — IOPAMIDOL (ISOVUE-370) INJECTION 76%
75.0000 mL | Freq: Once | INTRAVENOUS | Status: AC | PRN
  Administered 2017-07-12: 75 mL via INTRAVENOUS

## 2017-07-12 MED ORDER — SODIUM CHLORIDE 0.9 % IV SOLN
Freq: Once | INTRAVENOUS | Status: AC
  Administered 2017-07-12: 15:00:00 via INTRAVENOUS
  Filled 2017-07-12: qty 1000

## 2017-07-12 MED ORDER — SODIUM CHLORIDE 0.9% FLUSH
10.0000 mL | INTRAVENOUS | Status: AC | PRN
  Administered 2017-07-12: 10 mL via INTRAVENOUS
  Filled 2017-07-12: qty 10

## 2017-07-12 MED ORDER — HEPARIN SOD (PORK) LOCK FLUSH 100 UNIT/ML IV SOLN
500.0000 [IU] | Freq: Once | INTRAVENOUS | Status: AC
  Filled 2017-07-12: qty 5

## 2017-07-12 NOTE — Telephone Encounter (Signed)
Patient called and reports that he is having shortness of breath again like last time he saw Sonia Baller. Asking if he can be seen this morning  Per Lorretta Harp, NP ok for patient to come in and have labs and be seen. Patient accepts appointment at 145 for lab and see Sonia Baller afterwards

## 2017-07-12 NOTE — Telephone Encounter (Signed)
Nutrition Follow-up:  Patient with adenocarcinoma of gallbladder receiving chemotherapy.    Called patient this am.  Reports that he is not feeling good this am and reports has not felt well since the last time we talked.  Noted patient received blood due to chemo induced anemia.  Reports that he felt better after blood being given.  Reports appetite was a little bit better after blood being given.  Reports that he is trying to eat but it is hard.  Reports that he has been trying to eat foods high in iron as well.  Noted medications adjusted for constipation on 6/4 visit.     Medications: reviewed  Labs: reviewed from 6/5  Anthropometrics:   Weight increased to 180 lb from 178 lb 6.4 oz on 5/21.     NUTRITION DIAGNOSIS: Malnutrition improving   MALNUTRITION DIAGNOSIS: severe malnutrition improving   INTERVENTION:   Encouraged patient to make an appointment with symptom management if not feeling well.  Patient reports that if he is not feeling better will call and make an appointment. Encouraged continued good nutrition. Patient may benefit from dose adjustment with megace.  Message sent to MD and NP.       MONITORING, EVALUATION, GOAL: weight trends, intake   NEXT VISIT:  Phone follow-up  Thomas David, [place], [place] Registered Dietitian [phone] (pager)

## 2017-07-12 NOTE — Progress Notes (Signed)
Symptom Management Consult note [facility]  [phone] [phone]  Patient Care Team: Leone Haven, MD as PCP - General (Family Medicine) Kathyrn Drown, MD (Family Medicine) Clent Jacks, RN as Registered Nurse   Name of the patient: Thomas David  [id number]  Jan 30, 1936   Date of visit: 07/13/17  [place] of gallbladder  Chief complaint/ Reason for visit- shortness of breath  Heme/Onc history:  Patient was last seen by me on 07/07/2016 prior to cycle 4 of cisplatin and gemcitabine.  Reviewed recent PET scan to assess response to recent therapy. PET scan revealed improvement of disease burden.  Patient recently received 1 unit packed red blood cells last week for hemoglobin of 7.4.  Admitted to feeling much better.  Admitted to chronic bowel concerns stating he felt best having to  bowel movements daily. Was prescribed lactulose as needed. Weight has improved and was up 1 pound.   Labs revealed mildly elevated liver enzymes, stable anemia with hemoglobin of 8.9 and chronic fatigue.   Oncology History   Patient with initial diagnosis in January 2019 where he was admitted to the hospital for intermittent abdominal pain for several months.  This was thought to be related to his gallbladder he had surgery in October which was unsuccessful secondary to significant inflammation.  A drain was placed.  Had a cholecystectomy in December 2018 which revealed adenocarcinoma.  Had positive margins with disease on serosal surface.  Pet scans from March 19, 2017 confirming stage IV disease with omental and liver metastasis.  Plan is to continue palliative chemotherapy using gemcitabine and cisplatin Day 1 and gemcitabine only on days 8 and 15.  Cisplatin has been dose reduced due to difficulties during cycle 1.  He is scheduled for restaging PET/CT on July 06, 2017.  Pet Scan from 07/06/17 revealed improvement of hepatic  metastasis decrease in metabolic activity of right adrenal gland metastasis and resolution of metabolic activity and left adrenal gland metastasis mild decrease in metabolic activity of omental metastasis disease.  Several new small pulmonary nodules in left lower lobe hypermetabolic.     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  Symptoms include Shortness of breath rest and just not feeling well.. Symptoms began 2 days ago, gradually worsening since that time.  Patient denies hemoptysis, cough or sputum production. Associated symptoms include dyspnea. Patient has not had recent travel.  Weight has been stable.  Appetite has been absent. Symptoms are exacerbated by any exercise. Symptoms are alleviated by rest.    ECOG FS:2 - Symptomatic, <50% confined to bed  Review of systems- Review of Systems  Constitutional: Positive for malaise/fatigue and weight loss. Negative for chills and fever.  HENT: Negative for congestion and ear pain.   Eyes: Negative.  Negative for blurred vision and double vision.  Respiratory: Positive for shortness of breath. Negative for cough and sputum production.   Cardiovascular: Negative.  Negative for chest pain, palpitations and leg swelling.  Gastrointestinal: Negative.  Negative for abdominal pain, constipation, diarrhea, nausea and vomiting.  Genitourinary: Negative for dysuria, frequency and urgency.  Musculoskeletal: Negative for back pain and falls.  Skin: Negative.  Negative for rash.  Neurological: Positive for weakness. Negative for headaches.  Endo/Heme/Allergies: Negative.  Does not bruise/bleed easily.  Psychiatric/Behavioral: Negative.  Negative for depression. The patient is not nervous/anxious and does not have insomnia.      Current  treatment- S/p Day 1 Cycle 4 cisplatin/gemcitabine  No Known Allergies   Past Medical History:  Diagnosis Date  . Anxiety   . Arthritis   . Chronic lower back pain   . Crohn's disease ([place])   . Depression   . Difficult intubation    patient denies on 02/12/17  . GERD (gastroesophageal reflux disease)   . Gout   . History of kidney stones 40 years ago  . Hypercholesteremia   . Hypertension   . Prediabetes   . Sleep apnea    cpap      Past Surgical History:  Procedure Laterality Date  . CHOLECYSTECTOMY N/A 11/03/2016   Procedure: LAPAROSCOPIC CHOLECYSTostomy with tube placement ;  Surgeon: Johnathan Hausen, MD;  Location: [facility];  Service: General;  Laterality: N/A;  . CHOLECYSTECTOMY N/A 02/15/2017   Procedure: LAPAROSCOPIC SUBTOTAL CHOLECYSTECTOMY, BIOPSY OF GALLBLADDER;  Surgeon: Johnathan Hausen, MD;  Location: [facility];  Service: General;  Laterality: N/A;  . COLECTOMY  1973   "part of large intestines" (05/04/2012)  . COLON RESECTION    . COLONOSCOPY    . COLONOSCOPY N/A 05/23/2014   Procedure: COLONOSCOPY;  Surgeon: Rogene Houston, MD;  [facility];  Service: Endoscopy;  Laterality: N/A;  1200  . ESOPHAGEAL DILATION N/A 05/23/2014   Procedure: ESOPHAGEAL DILATION;  Surgeon: Rogene Houston, MD;  [facility];  Service: Endoscopy;  Laterality: N/A;  . ESOPHAGOGASTRODUODENOSCOPY N/A 03/16/2014   Procedure: esophageal dilation ;  Surgeon: Rogene Houston, MD;  [facility];  Service: Endoscopy;  Laterality: N/A;  . ESOPHAGOGASTRODUODENOSCOPY N/A 05/23/2014   Procedure: ESOPHAGOGASTRODUODENOSCOPY (EGD);  Surgeon: Rogene Houston, MD;  [facility];  Service: Endoscopy;  Laterality: N/A;  . ESOPHAGOGASTRODUODENOSCOPY (EGD) WITH PROPOFOL N/A 02/18/2017   Procedure: ESOPHAGOGASTRODUODENOSCOPY (EGD) WITH PROPOFOL;  Surgeon: Wonda Horner, MD;  Location: [facility];  Service: Endoscopy;  Laterality: N/A;  . [place]  . IR  CHOLANGIOGRAM EXISTING TUBE  11/26/2016  . KIDNEY STONE SURGERY  1970's  . PARTIAL COLECTOMY  ~ 40 years ago   some of large and small  . PORTA CATH INSERTION N/A 03/17/2017   Procedure: PORTA CATH INSERTION;  Surgeon: Katha Cabal, MD;  Location: [facility];  Service: Cardiovascular;  Laterality: N/A;  . PROSTATE SURGERY    . THYROIDECTOMY Right 05/04/2012   Procedure: RIGHT HEMI THYROIDECTOMY;  Surgeon: Ascencion Dike, MD;  Location: [facility];  Service: ENT;  Laterality: Right;  . THYROIDECTOMY    . TONSILLECTOMY AND ADENOIDECTOMY     as a child  . TRANSURETHRAL PROSTATECTOMY WITH GYRUS INSTRUMENTS N/A 07/11/2012   Procedure: TRANSURETHRAL PROSTATECTOMY WITH GYRUS INSTRUMENTS;  Surgeon: Claybon Jabs, MD;  Location: [facility];  Service: Urology;  Laterality: N/A;  . UPPER GASTROINTESTINAL ENDOSCOPY      Social History   Socioeconomic History  . Marital status: Widowed    Spouse name: Not on file  . Number of children: Not on file  . Years of education: Not on file  . Highest education level: Not on file  Occupational History  . Not on file  Social Needs  . Financial resource strain: Not on file  . Food insecurity:    Worry: Not on file    Inability: Not on file  . Transportation needs:  Medical: Not on file    Non-medical: Not on file  Tobacco Use  . Smoking status: Never Smoker  . Smokeless tobacco: Never Used  Substance and Sexual Activity  . Alcohol use: No    Alcohol/week: 0.0 oz  . Drug use: No  . Sexual activity: Never  Lifestyle  . Physical activity:    Days per week: Not on file    Minutes per session: Not on file  . Stress: Not on file  Relationships  . Social connections:    Talks on phone: Not on file    Gets together: Not on file    Attends religious service: Not on file    Active member of club or organization: Not on file    Attends meetings of clubs or organizations: Not on file    Relationship status: Not on file  . Intimate partner  violence:    Fear of current or ex partner: Not on file    Emotionally abused: Not on file    Physically abused: Not on file    Forced sexual activity: Not on file  Other Topics Concern  . Not on file  Social History Narrative   ** Merged History Encounter **        Family History  Problem Relation Age of Onset  . Diabetes Mother   . Stroke Father   . Healthy Daughter   . Healthy Son   . Heart disease Sister      Current Outpatient Medications:  .  acetaminophen (TYLENOL) 500 MG tablet, Take 1,000 mg by mouth every 4 (four) hours as needed for moderate pain or headache. , Disp: , Rfl:  .  allopurinol (ZYLOPRIM) 300 MG tablet, Take 1 tablet (300 mg total) by mouth every morning., Disp: 90 tablet, Rfl: 3 .  amLODipine (NORVASC) 5 MG tablet, TAKE 1 TABLET BY MOUTH  DAILY, Disp: 90 tablet, Rfl: 3 .  aspirin EC 81 MG tablet, Take 81 mg by mouth at bedtime., Disp: , Rfl:  .  bisacodyl (DULCOLAX) 10 MG suppository, Place 1 suppository (10 mg total) rectally daily as needed for moderate constipation., Disp: 12 suppository, Rfl: 0 .  fentaNYL (DURAGESIC - DOSED MCG/HR) 25 MCG/HR patch, Place 1 patch (25 mcg total) onto the skin every 3 (three) days., Disp: 10 patch, Rfl: 0 .  fluticasone (FLONASE) 50 MCG/ACT nasal spray, USE 1 SPRAY INTO BOTH  NOSTRILS DAILY (Patient not taking: Reported on 06/15/2017), Disp: 32 g, Rfl: 0 .  Histamine Dihydrochloride (AUSTRALIAN DREAM ARTHRITIS EX), Apply 1 application topically 2 (two) times daily as needed (back pain). , Disp: , Rfl:  .  HYDROcodone-acetaminophen (NORCO/VICODIN) 5-325 MG tablet, Take 1-2 tablets by mouth every 4 (four) hours as needed for moderate pain., Disp: 30 tablet, Rfl: 0 .  lactulose (CEPHULAC) 10 g packet, Take 1 packet (10 g total) by mouth as needed., Disp: 30 each, Rfl: 0 .  lidocaine-prilocaine (EMLA) cream, Apply to port 1 hours prior to chemotherapy appointment. Cover with plastic wrap., Disp: 30 g, Rfl: 3 .  losartan (COZAAR)  100 MG tablet, Take 1 tablet (100 mg total) by mouth daily., Disp: 90 tablet, Rfl: 3 .  megestrol (MEGACE) 40 MG tablet, Take 1 tablet (40 mg total) by mouth daily., Disp: 30 tablet, Rfl: 2 .  mesalamine (PENTASA) 500 MG CR capsule, Take 1 capsule (500 mg total) by mouth 4 (four) times daily. (Patient taking differently: Take 1,000 mg by mouth 2 (two) times daily. ), Disp: 360 capsule,  Rfl: 3 .  metoprolol tartrate (LOPRESSOR) 50 MG tablet, Take 1 tablet (50 mg total) by mouth 2 (two) times daily., Disp: 180 tablet, Rfl: 3 .  Multiple Vitamin (MULTIVITAMIN WITH MINERALS) TABS tablet, Take 2 tablets by mouth daily., Disp: , Rfl:  .  ondansetron (ZOFRAN) 8 MG tablet, Take 1 tablet (8 mg total) by mouth 2 (two) times daily. (Patient not taking: Reported on 07/07/2017), Disp: 30 tablet, Rfl: 3 .  oxybutynin (DITROPAN-XL) 5 MG 24 hr tablet, Take 1 tablet (5 mg total) by mouth at bedtime. (Patient not taking: Reported on 06/22/2017), Disp: 90 tablet, Rfl: 1 .  pantoprazole (PROTONIX) 40 MG tablet, Take 1 tablet (40 mg total) by mouth daily., Disp: 30 tablet, Rfl: 1 .  simvastatin (ZOCOR) 20 MG tablet, Take 1 tablet (20 mg total) by mouth daily., Disp: 90 tablet, Rfl: 3 .  vitamin B-12 (CYANOCOBALAMIN) 1000 MCG tablet, Take 1,000 mcg by mouth 2 (two) times daily., Disp: , Rfl:  No current facility-administered medications for this visit.   Facility-Administered Medications Ordered in Other Visits:  .  heparin lock flush 100 unit/mL, 500 Units, Intravenous, Once, Adna Nofziger E, NP .  sodium chloride flush (NS) 0.9 % injection 10 mL, 10 mL, Intravenous, PRN, Jacquelin Hawking, NP, 10 mL at 07/12/17 1445  Physical exam:  Vitals:   07/12/17 1451  BP: 112/64  Pulse: 86  Resp: (!) 22  Temp: 98.8 F (37.1 C)  TempSrc: Tympanic  SpO2: (!) 84%  PF: (!) 2 L/min   Physical Exam  Constitutional: He is oriented to person, place, and time. Vital signs are normal. He appears ill.  HENT:  Head:  Normocephalic and atraumatic.  Eyes: Pupils are equal, round, and reactive to light.  Neck: Normal range of motion.  Cardiovascular: Normal rate, regular rhythm and normal heart sounds.  No murmur heard. Pulmonary/Chest: Effort normal. He has decreased breath sounds. He has no wheezes.  Hypoxic 84% on room air  Abdominal: Soft. Normal appearance and bowel sounds are normal. He exhibits no distension. There is no tenderness.  Musculoskeletal: Normal range of motion. He exhibits no edema.  Neurological: He is alert and oriented to person, place, and time.  Skin: Skin is warm and dry. No rash noted. There is pallor.  Psychiatric: Judgment normal.     CMP Latest Ref Rng & Units 07/12/2017  Glucose 65 - 99 mg/dL 168(H)  BUN 6 - 20 mg/dL 43(H)  Creatinine 0.61 - 1.24 mg/dL 1.24  Sodium 135 - 145 mmol/L 134(L)  Potassium 3.5 - 5.1 mmol/L 5.9(H)  Chloride 101 - 111 mmol/L 108  CO2 22 - 32 mmol/L 17(L)  Calcium 8.9 - 10.3 mg/dL 9.6  Total Protein 6.5 - 8.1 g/dL 7.1  Total Bilirubin 0.3 - 1.2 mg/dL 0.3  Alkaline Phos 38 - 126 U/L 251(H)  AST 15 - 41 U/L 34  ALT 17 - 63 U/L 32   CBC Latest Ref Rng & Units 07/12/2017  WBC 3.8 - 10.6 K/uL 3.9  Hemoglobin 13.0 - 18.0 g/dL 9.5(L)  Hematocrit 40.0 - 52.0 % 27.2(L)  Platelets 150 - 440 K/uL 309    No images are attached to the encounter.  Ct Angio Chest Pe W Or Wo Contrast  Result Date: 07/12/2017 CLINICAL DATA:  Shortness of breath for 2 weeks, history of carcinoma the gallbladder EXAM: CT ANGIOGRAPHY CHEST WITH CONTRAST TECHNIQUE: Multidetector CT imaging of the chest was performed using the standard protocol during bolus administration of intravenous contrast. Multiplanar  CT image reconstructions and MIPs were obtained to evaluate the vascular anatomy. CONTRAST:  79m ISOVUE-370 IOPAMIDOL (ISOVUE-370) INJECTION 76% COMPARISON:  PET-CT of 07/06/2017 FINDINGS: Cardiovascular: The pulmonary arteries are well opacified. There is no evidence of  acute pulmonary embolism. The thoracic aorta also opacifies well with no acute abnormality. Moderate thoracic aortic atherosclerosis is noted and diffuse coronary artery calcifications are present. Mediastinum/Nodes: There is a precarinal lymph node of 11 mm in short axis diameter. No other prominent lymph node is seen in this most likely is post inflammatory or post infectious in origin. There is a small low-attenuation nodule within the left thyroid gland. The right thyroid gland is not seen and may have been resected previously. No hiatal hernia is noted. Lungs/Pleura: On lung window images, there is opacity abutting the minor fissure of the inferior anterior right upper lobe. This may represent scarring, atelectasis, or less likely pneumonia. Few bronchi due course through this region which appears slightly prominent and this may represent scarring from prior infection. No definite pneumonia or pleural effusion is currently seen. No suspicious lung nodule is noted. The airway is patent. Upper Abdomen: On limited views of the upper abdomen on this study, the liver is minimally prominent. No focal abnormality is seen. The gallbladder is somewhat distended. A large cyst emanates from the upper pole of the right kidney with small cyst emanating from the upper pole of the left kidney. Higher attenuation lesion emanates from the lateral posterior upper right kidney with attenuation of 33 HU. However, this area was not hypermetabolic on recent PET scan and this may simply represent a complex right renal cyst. Musculoskeletal: The thoracic vertebrae are in normal alignment with mild degenerative change. No compression deformity is seen. Review of the MIP images confirms the above findings. IMPRESSION: 1. No evidence of acute pulmonary embolism. 2. Moderately severe thoracic aortic atherosclerosis and diffuse coronary artery calcifications. 3. Probable scarring or atelectasis abutting the minor fissure in the anterior  inferior right upper lobe. 4. Bilateral renal cysts not well imaged on this CT the chest as noted above, at least 1 of which on the right appears complex. Electronically Signed   By: PIvar DrapeM.D.   On: 07/12/2017 16:56   Nm Pet Image Restag (ps) Skull Base To Thigh  Result Date: 07/06/2017 CLINICAL DATA:  Subsequent treatment strategy for gallbladder carcinoma. EXAM: NUCLEAR MEDICINE PET SKULL BASE TO THIGH TECHNIQUE: 9.3 mCi F-18 FDG was injected intravenously. Full-ring PET imaging was performed from the skull base to thigh after the radiotracer. CT data was obtained and used for attenuation correction and anatomic localization. Fasting blood glucose: 87 mg/dl COMPARISON:  PET-CT 03/19/2017, CT 09/17/2016 FINDINGS: Mediastinal blood pool activity: SUV max 2.2 NECK: No hypermetabolic lymph nodes in the neck. Incidental CT findings: none CHEST: Decrease in nodularity in the RIGHT upper lobe. 6 mm nodule decreased from 7 mm (image 79/3). Band of atelectatic thickening in the RIGHT middle lobe is also unchanged. No hypermetabolic pulmonary nodules. Small nodule in the LEFT lower lobe measures 4 mm (image 114/3) which is not clearly seen on prior. There are two additional nodules adjacent which are new (image 111/3. Incidental CT findings: Coronary artery calcification and aortic atherosclerotic calcification. ABDOMEN/PELVIS: Interval decrease in the metabolic activity of hepatic metastasis in the LEFT hepatic lobe. No discrete metabolic activity above background liver activity. There is interval decrease in metabolic activity enlarged RIGHT adrenal gland with SUV max equal 5.8 decreased from 8.1. Near complete resolution of metabolic activity  in the LEFT adrenal gland. The hypermetabolic omental thickening in the RIGHT upper quadrant is mildly decreased SUV max equal 4.0 decreased from 6.4. visually the thickening is similar (image 182/3) Incidental CT findings: Large prostate gland. Atherosclerotic  calcification of the aorta. Extensive renal cysts without metabolic activity SKELETON: No focal hypermetabolic activity to suggest skeletal metastasis. Incidental CT findings: none IMPRESSION: 1. Improvement hepatic metastasis with no discrete lesion above liver background metabolic activity. 2. Decrease in metabolic activity of RIGHT adrenal gland metastasis and resolution of metabolic activity in LEFT adrenal gland metastasis. 3. Mild decrease in metabolic activity of omental metastatic disease in the RIGHT upper quadrant with similar omental thickening. 4. Several new small pulmonary nodules in the LEFT lower lobe medially are indeterminate. Recommend attention on follow-up Electronically Signed   By: Suzy Bouchard M.D.   On: 07/06/2017 11:36     Assessment and plan- Patient is a 82 y.o. male who presents for shortness of breath x2 days.  1.  Gallbladder cancer: S/p cycle 4 of cisplatin and gemcitabine on 07/07/17.  Dose reduced secondary to declining performance status and confusion.  Tolerated dose reduction.  Had recent restaging PET scan on 07/06/2017 revealing decrease in metabolic activity of hepatic metastasis in the left hepatic lobe, interval decrease in metabolic activity enlarged right adrenal gland and near complete resolution of metabolic activity in the left adrenal gland.  Hypermetabolic  omental thickening in right upper quadrant was mildly decreased.  He is scheduled to return to clinic on 07/14/2017 for cycle 5 of gemcitabine/cisplatin, labs and MD assessment.   2.  Shortness of breath: Oxygen saturations 84% on RA.  He is visibly short of breath.  Applied 2 L of oxygen with improved oxygen saturations to 100%.  Does not currently use oxygen at home.  Lungs are clear during auscultation.  Hypoxia improved with fluids. At discharge, oxygen saturations were 94% on room air.   STAT EKG: Normal sinus rhythm.   STAT CTA: Rule out PE.  Patient currently undergoing chemotherapy and is on  Megace. Results:  No evidence of acute pulmonary embolism.  Probable scarring or atelectasis along the minor fissure in the anterior inferior right upper lobe.  Bilateral renal cysts.  3.  Orthostatic hypotension: He will receive 1 L NaCl in clinic today.  Blood pressure 112/64 while sitting and 77/54 when standing. Improved with fluids. He will RTC in the morning for additional fluids and further evaluation.   4.  Hyperkalemia: Not currently on oral supplementation.  Potentially from dehydration??  RX Kayexalate. Denies palpitations.  He will be receiving 1 L NaCl which should help correct this. EKG Normal Sinus Rhythm.    Visit Diagnosis 1. Hyperkalemia   2. Shortness of breath     Patient expressed understanding and was in agreement with this plan. He also understands that He can call clinic at any time with any questions, concerns, or complaints.   Greater than 50% was spent in counseling and coordination of care with this patient including but not limited to discussion of the relevant topics above (See A&P) including, but not limited to diagnosis and management of acute and chronic medical conditions.    Faythe Casa, AGNP-C [facility]- [phone] Pager- [phone] 07/13/2017 9:02 AM

## 2017-07-13 ENCOUNTER — Inpatient Hospital Stay (HOSPITAL_BASED_OUTPATIENT_CLINIC_OR_DEPARTMENT_OTHER): Payer: Medicare Other | Admitting: Oncology

## 2017-07-13 ENCOUNTER — Other Ambulatory Visit: Payer: Self-pay

## 2017-07-13 ENCOUNTER — Inpatient Hospital Stay: Payer: Medicare Other

## 2017-07-13 ENCOUNTER — Other Ambulatory Visit: Payer: Self-pay | Admitting: *Deleted

## 2017-07-13 VITALS — BP 111/67 | HR 82 | Temp 98.1°F | Resp 20

## 2017-07-13 DIAGNOSIS — C786 Secondary malignant neoplasm of retroperitoneum and peritoneum: Secondary | ICD-10-CM

## 2017-07-13 DIAGNOSIS — E7800 Pure hypercholesterolemia, unspecified: Secondary | ICD-10-CM

## 2017-07-13 DIAGNOSIS — Z87442 Personal history of urinary calculi: Secondary | ICD-10-CM | POA: Diagnosis not present

## 2017-07-13 DIAGNOSIS — R0602 Shortness of breath: Secondary | ICD-10-CM | POA: Diagnosis not present

## 2017-07-13 DIAGNOSIS — G8929 Other chronic pain: Secondary | ICD-10-CM

## 2017-07-13 DIAGNOSIS — E785 Hyperlipidemia, unspecified: Secondary | ICD-10-CM

## 2017-07-13 DIAGNOSIS — K5090 Crohn's disease, unspecified, without complications: Secondary | ICD-10-CM | POA: Diagnosis not present

## 2017-07-13 DIAGNOSIS — G4730 Sleep apnea, unspecified: Secondary | ICD-10-CM | POA: Diagnosis not present

## 2017-07-13 DIAGNOSIS — M129 Arthropathy, unspecified: Secondary | ICD-10-CM | POA: Diagnosis not present

## 2017-07-13 DIAGNOSIS — E875 Hyperkalemia: Secondary | ICD-10-CM

## 2017-07-13 DIAGNOSIS — C23 Malignant neoplasm of gallbladder: Secondary | ICD-10-CM | POA: Diagnosis not present

## 2017-07-13 DIAGNOSIS — R5383 Other fatigue: Secondary | ICD-10-CM

## 2017-07-13 DIAGNOSIS — R531 Weakness: Secondary | ICD-10-CM | POA: Diagnosis not present

## 2017-07-13 DIAGNOSIS — Z7982 Long term (current) use of aspirin: Secondary | ICD-10-CM

## 2017-07-13 DIAGNOSIS — E860 Dehydration: Secondary | ICD-10-CM | POA: Diagnosis not present

## 2017-07-13 DIAGNOSIS — I951 Orthostatic hypotension: Secondary | ICD-10-CM

## 2017-07-13 DIAGNOSIS — K5900 Constipation, unspecified: Secondary | ICD-10-CM | POA: Diagnosis not present

## 2017-07-13 DIAGNOSIS — C787 Secondary malignant neoplasm of liver and intrahepatic bile duct: Secondary | ICD-10-CM | POA: Diagnosis not present

## 2017-07-13 DIAGNOSIS — R110 Nausea: Secondary | ICD-10-CM | POA: Diagnosis not present

## 2017-07-13 DIAGNOSIS — Z79899 Other long term (current) drug therapy: Secondary | ICD-10-CM | POA: Diagnosis not present

## 2017-07-13 DIAGNOSIS — K219 Gastro-esophageal reflux disease without esophagitis: Secondary | ICD-10-CM

## 2017-07-13 DIAGNOSIS — R948 Abnormal results of function studies of other organs and systems: Secondary | ICD-10-CM | POA: Diagnosis not present

## 2017-07-13 DIAGNOSIS — D6481 Anemia due to antineoplastic chemotherapy: Secondary | ICD-10-CM | POA: Diagnosis not present

## 2017-07-13 DIAGNOSIS — F418 Other specified anxiety disorders: Secondary | ICD-10-CM

## 2017-07-13 DIAGNOSIS — Z95828 Presence of other vascular implants and grafts: Secondary | ICD-10-CM

## 2017-07-13 DIAGNOSIS — Z5111 Encounter for antineoplastic chemotherapy: Secondary | ICD-10-CM

## 2017-07-13 LAB — POTASSIUM: Potassium: 4.7 mmol/L (ref 3.5–5.1)

## 2017-07-13 MED ORDER — ONDANSETRON HCL 4 MG/2ML IJ SOLN
INTRAMUSCULAR | Status: AC
  Filled 2017-07-13: qty 4

## 2017-07-13 MED ORDER — HEPARIN SOD (PORK) LOCK FLUSH 100 UNIT/ML IV SOLN
500.0000 [IU] | Freq: Once | INTRAVENOUS | Status: AC
  Administered 2017-07-13: 500 [IU] via INTRAVENOUS

## 2017-07-13 MED ORDER — SODIUM CHLORIDE 0.9 % IV SOLN: Freq: Once | INTRAVENOUS | Status: DC

## 2017-07-13 MED ORDER — DEXAMETHASONE SODIUM PHOSPHATE 10 MG/ML IJ SOLN
INTRAMUSCULAR | Status: AC
  Filled 2017-07-13: qty 1

## 2017-07-13 MED ORDER — DEXAMETHASONE SODIUM PHOSPHATE 10 MG/ML IJ SOLN
10.0000 mg | Freq: Once | INTRAMUSCULAR | Status: AC
  Administered 2017-07-13: 10 mg via INTRAVENOUS

## 2017-07-13 MED ORDER — ONDANSETRON HCL 4 MG/2ML IJ SOLN
8.0000 mg | Freq: Once | INTRAMUSCULAR | Status: AC
  Administered 2017-07-13: 8 mg via INTRAVENOUS

## 2017-07-13 MED ORDER — SODIUM CHLORIDE 0.9 % IV SOLN
INTRAVENOUS | Status: DC
  Administered 2017-07-13: 09:00:00 via INTRAVENOUS
  Filled 2017-07-13 (×2): qty 1000

## 2017-07-13 MED ORDER — SODIUM POLYSTYRENE SULFONATE PO POWD: Freq: Once | ORAL | 0 refills | Status: DC

## 2017-07-13 NOTE — Progress Notes (Signed)
Symptom Management Consult note [facility]  Telephone:([phone] [phone]  Patient Care Team: Leone Haven, MD as PCP - General (Family Medicine) Kathyrn Drown, MD (Family Medicine) Clent Jacks, RN as Registered Nurse   Name of the patient: Thomas David  [id number]  11/09/1935   Date of visit: 07/13/17  Diagnosis-adenocarcinoma of gallbladder  Chief complaint/ Reason for visit-orthostatic hypotension/dehydration  Heme/Onc history: Patient was last seen in symptom management clinic on 07/12/2017 for "not feeling well".  Positive for orthostatic hypotension and hypoxia (84% on room air).  He was given 1 L NaCl with marked improvement.  He was sent for stat CTA which was negative for pulmonary embolism.  No other acute abnormality was found.  Noted to be hyperkalemic with a potassium of 5.9.  Stat EKG revealed normal sinus rhythm.  Heart rate within normal limits.  He was discharged in stable condition and scheduled to return next morning for additional fluids and reassessment.  He is s/p cycle 4-day 1 gemcitabine/cisplatin (07/07/17).  He will receive gemcitabine only on day 8 and again on day 15.  Oncology History   Patient with initial diagnosis in January 2019 where he was admitted to the hospital for intermittent abdominal pain for several months.  This was thought to be related to his gallbladder he had surgery in October which was unsuccessful secondary to significant inflammation.  A drain was placed.  Had a cholecystectomy in December 2018 which revealed adenocarcinoma.  Had positive margins with disease on serosal surface.  Pet scans from March 19, 2017 confirming stage IV disease with omental and liver metastasis.  Plan is to continue palliative chemotherapy using gemcitabine and cisplatin Day 1 and gemcitabine only on days 8 and 15.  Cisplatin has been dose reduced due to difficulties during cycle 1.  He is scheduled for  restaging PET/CT on July 06, 2017.  Pet Scan from 07/06/17 revealed improvement of hepatic metastasis decrease in metabolic activity of right adrenal gland metastasis and resolution of metabolic activity and left adrenal gland metastasis mild decrease in metabolic activity of omental metastasis disease.  Several new small pulmonary nodules in left lower lobe hypermetabolic.     Adenocarcinoma of gallbladder ([place])   02/19/2017 Initial Diagnosis    Adenocarcinoma of gallbladder ([place])       Interval history-  Patient presents for presents reevaluation of abnormal labs and additional IV fluids.  He presented to symptom management clinic yesterday and was found to be orthostatic(sitting blood pressure 112/64 and standing blood pressure 77/54).  He was hydrated with 1 L NaCl.  Symptoms began approximately 1 day ago.  Admits to not staying hydrated drinking very little water. Labs also revealed hyperkalemia with a potassium of 5.9. Was hypoxic with oxygen level of 84% yesterday.  He does not wear home O2.  He was placed on 2 L [place] with complete resolution.  Discharged without oxygen; saturations maintained in mid 90s after hydration.  Abnormal labs were discovered by routine CBC and CMET.  It has been present for 1 day.  Associated signs & symptoms: dizziness/lightheadedness, dyspnea, fatigue and pallor.   ECOG FS:2 - Symptomatic, <50% confined to bed  Review of systems- Review of Systems  Constitutional: Positive for malaise/fatigue (Chronic). Negative for chills, fever and weight loss.  HENT: Negative for congestion and ear pain.   Eyes: Negative.  Negative for blurred vision and double vision.  Respiratory: Negative.  Negative for cough, sputum production and shortness of  breath.   Cardiovascular: Negative.  Negative for chest pain, palpitations and leg swelling.  Gastrointestinal: Negative.  Negative for abdominal pain, constipation, diarrhea, nausea and vomiting.  Genitourinary: Negative for  dysuria, frequency and urgency.  Musculoskeletal: Negative for back pain and falls.  Skin: Negative.  Negative for rash.  Neurological: Negative.  Negative for weakness and headaches.  Endo/Heme/Allergies: Negative.  Does not bruise/bleed easily.  Psychiatric/Behavioral: Negative.  Negative for depression. The patient is not nervous/anxious and does not have insomnia.      Current treatment- S/p Day 1 cycle 4 cisplatin/gemcitabine  No Known Allergies   Past Medical History:  Diagnosis Date  . Anxiety   . Arthritis   . Chronic lower back pain   . Crohn's disease ([place])   . Depression   . Difficult intubation    patient denies on 02/12/17  . GERD (gastroesophageal reflux disease)   . Gout   . History of kidney stones 40 years ago  . Hypercholesteremia   . Hypertension   . Prediabetes   . Sleep apnea    cpap      Past Surgical History:  Procedure Laterality Date  . CHOLECYSTECTOMY N/A 11/03/2016   Procedure: LAPAROSCOPIC CHOLECYSTostomy with tube placement ;  Surgeon: Johnathan Hausen, MD;  Location: [facility];  Service: General;  Laterality: N/A;  . CHOLECYSTECTOMY N/A 02/15/2017   Procedure: LAPAROSCOPIC SUBTOTAL CHOLECYSTECTOMY, BIOPSY OF GALLBLADDER;  Surgeon: Johnathan Hausen, MD;  Location: [facility];  Service: General;  Laterality: N/A;  . COLECTOMY  1973   "part of large intestines" (05/04/2012)  . COLON RESECTION    . COLONOSCOPY    . COLONOSCOPY N/A 05/23/2014   Procedure: COLONOSCOPY;  Surgeon: Rogene Houston, MD;  [facility];  Service: Endoscopy;  Laterality: N/A;  1200  . ESOPHAGEAL DILATION N/A 05/23/2014   Procedure: ESOPHAGEAL DILATION;  Surgeon: Rogene Houston, MD;  [facility];  Service: Endoscopy;  Laterality: N/A;  . ESOPHAGOGASTRODUODENOSCOPY N/A 03/16/2014   Procedure: esophageal dilation ;  Surgeon: Rogene Houston, MD;  [facility];  Service: Endoscopy;  Laterality: N/A;  . ESOPHAGOGASTRODUODENOSCOPY N/A 05/23/2014    Procedure: ESOPHAGOGASTRODUODENOSCOPY (EGD);  Surgeon: Rogene Houston, MD;  [facility];  Service: Endoscopy;  Laterality: N/A;  . ESOPHAGOGASTRODUODENOSCOPY (EGD) WITH PROPOFOL N/A 02/18/2017   Procedure: ESOPHAGOGASTRODUODENOSCOPY (EGD) WITH PROPOFOL;  Surgeon: Wonda Horner, MD;  Location: [facility];  Service: Endoscopy;  Laterality: N/A;  . [place]  . IR CHOLANGIOGRAM EXISTING TUBE  11/26/2016  . KIDNEY STONE SURGERY  1970's  . PARTIAL COLECTOMY  ~ 40 years ago   some of large and small  . PORTA CATH INSERTION N/A 03/17/2017   Procedure: PORTA CATH INSERTION;  Surgeon: Katha Cabal, MD;  Location: [facility];  Service: Cardiovascular;  Laterality: N/A;  . PROSTATE SURGERY    . THYROIDECTOMY Right 05/04/2012   Procedure: RIGHT HEMI THYROIDECTOMY;  Surgeon: Ascencion Dike, MD;  Location: [facility];  Service: ENT;  Laterality: Right;  . THYROIDECTOMY    . TONSILLECTOMY AND ADENOIDECTOMY     as a child  . TRANSURETHRAL PROSTATECTOMY WITH GYRUS INSTRUMENTS N/A 07/11/2012   Procedure: TRANSURETHRAL PROSTATECTOMY WITH GYRUS INSTRUMENTS;  Surgeon: Claybon Jabs, MD;  Location: [facility];  Service: Urology;  Laterality: N/A;  . UPPER GASTROINTESTINAL ENDOSCOPY      Social History   Socioeconomic History  . Marital status: Widowed    Spouse name: Not  on file  . Number of children: Not on file  . Years of education: Not on file  . Highest education level: Not on file  Occupational History  . Not on file  Social Needs  . Financial resource strain: Not on file  . Food insecurity:    Worry: Not on file    Inability: Not on file  . Transportation needs:    Medical: Not on file    Non-medical: Not on file  Tobacco Use  . Smoking status: Never Smoker  . Smokeless tobacco: Never Used  Substance and Sexual Activity  . Alcohol use: No    Alcohol/week: 0.0 oz  . Drug use: No  . Sexual activity: Never  Lifestyle  . Physical activity:    Days per week:  Not on file    Minutes per session: Not on file  . Stress: Not on file  Relationships  . Social connections:    Talks on phone: Not on file    Gets together: Not on file    Attends religious service: Not on file    Active member of club or organization: Not on file    Attends meetings of clubs or organizations: Not on file    Relationship status: Not on file  . Intimate partner violence:    Fear of current or ex partner: Not on file    Emotionally abused: Not on file    Physically abused: Not on file    Forced sexual activity: Not on file  Other Topics Concern  . Not on file  Social History Narrative   ** Merged History Encounter **        Family History  Problem Relation Age of Onset  . Diabetes Mother   . Stroke Father   . Healthy Daughter   . Healthy Son   . Heart disease Sister      Current Outpatient Medications:  .  acetaminophen (TYLENOL) 500 MG tablet, Take 1,000 mg by mouth every 4 (four) hours as needed for moderate pain or headache. , Disp: , Rfl:  .  allopurinol (ZYLOPRIM) 300 MG tablet, Take 1 tablet (300 mg total) by mouth every morning., Disp: 90 tablet, Rfl: 3 .  amLODipine (NORVASC) 5 MG tablet, TAKE 1 TABLET BY MOUTH  DAILY, Disp: 90 tablet, Rfl: 3 .  aspirin EC 81 MG tablet, Take 81 mg by mouth at bedtime., Disp: , Rfl:  .  bisacodyl (DULCOLAX) 10 MG suppository, Place 1 suppository (10 mg total) rectally daily as needed for moderate constipation., Disp: 12 suppository, Rfl: 0 .  fentaNYL (DURAGESIC - DOSED MCG/HR) 25 MCG/HR patch, Place 1 patch (25 mcg total) onto the skin every 3 (three) days., Disp: 10 patch, Rfl: 0 .  fluticasone (FLONASE) 50 MCG/ACT nasal spray, USE 1 SPRAY INTO BOTH  NOSTRILS DAILY, Disp: 32 g, Rfl: 0 .  Histamine Dihydrochloride (AUSTRALIAN DREAM ARTHRITIS EX), Apply 1 application topically 2 (two) times daily as needed (back pain). , Disp: , Rfl:  .  HYDROcodone-acetaminophen (NORCO/VICODIN) 5-325 MG tablet, Take 1-2 tablets by  mouth every 4 (four) hours as needed for moderate pain., Disp: 30 tablet, Rfl: 0 .  lactulose (CEPHULAC) 10 g packet, Take 1 packet (10 g total) by mouth as needed., Disp: 30 each, Rfl: 0 .  lidocaine-prilocaine (EMLA) cream, Apply to port 1 hours prior to chemotherapy appointment. Cover with plastic wrap., Disp: 30 g, Rfl: 3 .  losartan (COZAAR) 100 MG tablet, Take 1 tablet (100 mg total) by  mouth daily., Disp: 90 tablet, Rfl: 3 .  megestrol (MEGACE) 40 MG tablet, Take 1 tablet (40 mg total) by mouth daily., Disp: 30 tablet, Rfl: 2 .  mesalamine (PENTASA) 500 MG CR capsule, Take 1 capsule (500 mg total) by mouth 4 (four) times daily. (Patient taking differently: Take 1,000 mg by mouth 2 (two) times daily. ), Disp: 360 capsule, Rfl: 3 .  metoprolol tartrate (LOPRESSOR) 50 MG tablet, Take 1 tablet (50 mg total) by mouth 2 (two) times daily., Disp: 180 tablet, Rfl: 3 .  Multiple Vitamin (MULTIVITAMIN WITH MINERALS) TABS tablet, Take 2 tablets by mouth daily., Disp: , Rfl:  .  ondansetron (ZOFRAN) 8 MG tablet, Take 1 tablet (8 mg total) by mouth 2 (two) times daily., Disp: 30 tablet, Rfl: 3 .  oxybutynin (DITROPAN-XL) 5 MG 24 hr tablet, Take 1 tablet (5 mg total) by mouth at bedtime., Disp: 90 tablet, Rfl: 1 .  pantoprazole (PROTONIX) 40 MG tablet, Take 1 tablet (40 mg total) by mouth daily., Disp: 30 tablet, Rfl: 1 .  simvastatin (ZOCOR) 20 MG tablet, Take 1 tablet (20 mg total) by mouth daily., Disp: 90 tablet, Rfl: 3 .  sodium polystyrene (KAYEXALATE) powder, Take by mouth once for 1 dose., Disp: 454 g, Rfl: 0 .  vitamin B-12 (CYANOCOBALAMIN) 1000 MCG tablet, Take 1,000 mcg by mouth 2 (two) times daily., Disp: , Rfl:   Current Facility-Administered Medications:  .  0.9 %  sodium chloride infusion, , Intravenous, Continuous, Zanai Mallari, Wandra Feinstein, NP, Stopped at 07/13/17 1049  Facility-Administered Medications Ordered in Other Visits:  .  heparin lock flush 100 unit/mL, 500 Units, Intravenous, Once,  Taisley Mordan E, NP .  sodium chloride flush (NS) 0.9 % injection 10 mL, 10 mL, Intravenous, PRN, Faythe Casa E, NP, 10 mL at 07/12/17 1445  Physical exam:  Vitals:   07/13/17 0910  BP: 111/67  Pulse: 82  Resp: 20  Temp: 98.1 F (36.7 C)  TempSrc: Tympanic  SpO2: 100%   Physical Exam  Constitutional: He is oriented to person, place, and time. Vital signs are normal. He appears well-developed and well-nourished.  HENT:  Head: Normocephalic and atraumatic.  Eyes: Pupils are equal, round, and reactive to light.  Neck: Normal range of motion.  Cardiovascular: Normal rate, regular rhythm and normal heart sounds.  No murmur heard. Pulmonary/Chest: Effort normal and breath sounds normal. He has no wheezes.  Abdominal: Soft. Normal appearance and bowel sounds are normal. He exhibits no distension. There is no tenderness.  Musculoskeletal: Normal range of motion. He exhibits no edema.  Neurological: He is alert and oriented to person, place, and time.  Skin: Skin is warm and dry. No rash noted.  Psychiatric: Judgment normal.     CMP Latest Ref Rng & Units 07/13/2017  Glucose 65 - 99 mg/dL -  BUN 6 - 20 mg/dL -  Creatinine 0.61 - 1.24 mg/dL -  Sodium 135 - 145 mmol/L -  Potassium 3.5 - 5.1 mmol/L 4.7  Chloride 101 - 111 mmol/L -  CO2 22 - 32 mmol/L -  Calcium 8.9 - 10.3 mg/dL -  Total Protein 6.5 - 8.1 g/dL -  Total Bilirubin 0.3 - 1.2 mg/dL -  Alkaline Phos 38 - 126 U/L -  AST 15 - 41 U/L -  ALT 17 - 63 U/L -   CBC Latest Ref Rng & Units 07/12/2017  WBC 3.8 - 10.6 K/uL 3.9  Hemoglobin 13.0 - 18.0 g/dL 9.5(L)  Hematocrit 40.0 - 52.0 %  27.2(L)  Platelets 150 - 440 K/uL 309    No images are attached to the encounter.  Ct Angio Chest Pe W Or Wo Contrast  Result Date: 07/12/2017 CLINICAL DATA:  Shortness of breath for 2 weeks, history of carcinoma the gallbladder EXAM: CT ANGIOGRAPHY CHEST WITH CONTRAST TECHNIQUE: Multidetector CT imaging of the chest was performed using  the standard protocol during bolus administration of intravenous contrast. Multiplanar CT image reconstructions and MIPs were obtained to evaluate the vascular anatomy. CONTRAST:  53m ISOVUE-370 IOPAMIDOL (ISOVUE-370) INJECTION 76% COMPARISON:  PET-CT of 07/06/2017 FINDINGS: Cardiovascular: The pulmonary arteries are well opacified. There is no evidence of acute pulmonary embolism. The thoracic aorta also opacifies well with no acute abnormality. Moderate thoracic aortic atherosclerosis is noted and diffuse coronary artery calcifications are present. Mediastinum/Nodes: There is a precarinal lymph node of 11 mm in short axis diameter. No other prominent lymph node is seen in this most likely is post inflammatory or post infectious in origin. There is a small low-attenuation nodule within the left thyroid gland. The right thyroid gland is not seen and may have been resected previously. No hiatal hernia is noted. Lungs/Pleura: On lung window images, there is opacity abutting the minor fissure of the inferior anterior right upper lobe. This may represent scarring, atelectasis, or less likely pneumonia. Few bronchi due course through this region which appears slightly prominent and this may represent scarring from prior infection. No definite pneumonia or pleural effusion is currently seen. No suspicious lung nodule is noted. The airway is patent. Upper Abdomen: On limited views of the upper abdomen on this study, the liver is minimally prominent. No focal abnormality is seen. The gallbladder is somewhat distended. A large cyst emanates from the upper pole of the right kidney with small cyst emanating from the upper pole of the left kidney. Higher attenuation lesion emanates from the lateral posterior upper right kidney with attenuation of 33 HU. However, this area was not hypermetabolic on recent PET scan and this may simply represent a complex right renal cyst. Musculoskeletal: The thoracic vertebrae are in normal  alignment with mild degenerative change. No compression deformity is seen. Review of the MIP images confirms the above findings. IMPRESSION: 1. No evidence of acute pulmonary embolism. 2. Moderately severe thoracic aortic atherosclerosis and diffuse coronary artery calcifications. 3. Probable scarring or atelectasis abutting the minor fissure in the anterior inferior right upper lobe. 4. Bilateral renal cysts not well imaged on this CT the chest as noted above, at least 1 of which on the right appears complex. Electronically Signed   By: PIvar DrapeM.D.   On: 07/12/2017 16:56   Nm Pet Image Restag (ps) Skull Base To Thigh  Result Date: 07/06/2017 CLINICAL DATA:  Subsequent treatment strategy for gallbladder carcinoma. EXAM: NUCLEAR MEDICINE PET SKULL BASE TO THIGH TECHNIQUE: 9.3 mCi F-18 FDG was injected intravenously. Full-ring PET imaging was performed from the skull base to thigh after the radiotracer. CT data was obtained and used for attenuation correction and anatomic localization. Fasting blood glucose: 87 mg/dl COMPARISON:  PET-CT 03/19/2017, CT 09/17/2016 FINDINGS: Mediastinal blood pool activity: SUV max 2.2 NECK: No hypermetabolic lymph nodes in the neck. Incidental CT findings: none CHEST: Decrease in nodularity in the RIGHT upper lobe. 6 mm nodule decreased from 7 mm (image 79/3). Band of atelectatic thickening in the RIGHT middle lobe is also unchanged. No hypermetabolic pulmonary nodules. Small nodule in the LEFT lower lobe measures 4 mm (image 114/3) which is not clearly seen  on prior. There are two additional nodules adjacent which are new (image 111/3. Incidental CT findings: Coronary artery calcification and aortic atherosclerotic calcification. ABDOMEN/PELVIS: Interval decrease in the metabolic activity of hepatic metastasis in the LEFT hepatic lobe. No discrete metabolic activity above background liver activity. There is interval decrease in metabolic activity enlarged RIGHT adrenal gland  with SUV max equal 5.8 decreased from 8.1. Near complete resolution of metabolic activity in the LEFT adrenal gland. The hypermetabolic omental thickening in the RIGHT upper quadrant is mildly decreased SUV max equal 4.0 decreased from 6.4. visually the thickening is similar (image 182/3) Incidental CT findings: Large prostate gland. Atherosclerotic calcification of the aorta. Extensive renal cysts without metabolic activity SKELETON: No focal hypermetabolic activity to suggest skeletal metastasis. Incidental CT findings: none IMPRESSION: 1. Improvement hepatic metastasis with no discrete lesion above liver background metabolic activity. 2. Decrease in metabolic activity of RIGHT adrenal gland metastasis and resolution of metabolic activity in LEFT adrenal gland metastasis. 3. Mild decrease in metabolic activity of omental metastatic disease in the RIGHT upper quadrant with similar omental thickening. 4. Several new small pulmonary nodules in the LEFT lower lobe medially are indeterminate. Recommend attention on follow-up Electronically Signed   By: Suzy Bouchard M.D.   On: 07/06/2017 11:36     Assessment and plan- Patient is a 82 y.o. male who presents for reassessment and additional IV fluids.  1.  Gallbladder cancer: Status post cycle 4 of cisplatin and gemcitabine on 07/07/2017.  Scheduled for gemcitabine only on 07/14/2016.  Dose reduced after cycle 1 due to declining performance status and confusion.  Recent restaging PET on 07/06/2017 revealed decrease of hepatic metastasis, decrease enlarged right adrenal gland and near complete resolution of left adrenal gland.  New lung nodules that were not hypermetabolic were noted.  Hypermetabolic omental thickening in the right upper quadrant was mildly decreased.  2.  Orthostatic hypotension/hypoxia: Patient is not orthostatic today. BP sitting 111/67, BP standing 106/62. Oxygen saturations 100% on RA.  Feels much better.  We will proceed with 1 L NaCl, 10 mg  Decadron and 8 mg Zofran.  Still complains of mild nausea without vomiting.  Scheduled for treatment for tomorrow.    Spoke to patient at length about chemotherapy and the importance of nutrition/hydration.  Patient states "he forgets to drink fluids".  Does maintain an active lifestyle and routinely goes to the gym and walks around his community.  States he will do a better job of staying hydrated between cycles.  Patient may need scheduled fluids after each treatment.  We will speak to [name] and trying to get  this scheduled.  3.  Hyperkalemia: We will recheck potassium today.  If significantly elevated will give 1 dose Kayexalate.  Rechecked potassium is 4.7.  No additional medications are needed.  Has resolved on its own.  We will continue to monitor.   Visit Diagnosis 1. Dehydration   2. Nausea without vomiting     Patient expressed understanding and was in agreement with this plan. He also understands that He can call clinic at any time with any questions, concerns, or complaints.   Greater than 50% was spent in counseling and coordination of care with this patient including but not limited to discussion of the relevant topics above (See A&P) including, but not limited to diagnosis and management of acute and chronic medical conditions.    Faythe Casa, AGNP-C [facility]- [phone] Pager- [phone] 07/13/2017 11:01 AM

## 2017-07-14 ENCOUNTER — Inpatient Hospital Stay: Payer: Medicare Other

## 2017-07-14 ENCOUNTER — Inpatient Hospital Stay (HOSPITAL_BASED_OUTPATIENT_CLINIC_OR_DEPARTMENT_OTHER): Payer: Medicare Other | Admitting: Oncology

## 2017-07-14 ENCOUNTER — Encounter: Payer: Self-pay | Admitting: Oncology

## 2017-07-14 VITALS — BP 145/75 | HR 71 | Temp 96.8°F | Resp 18 | Wt 183.2 lb

## 2017-07-14 DIAGNOSIS — M129 Arthropathy, unspecified: Secondary | ICD-10-CM

## 2017-07-14 DIAGNOSIS — R5383 Other fatigue: Secondary | ICD-10-CM | POA: Diagnosis not present

## 2017-07-14 DIAGNOSIS — R110 Nausea: Secondary | ICD-10-CM

## 2017-07-14 DIAGNOSIS — Z87442 Personal history of urinary calculi: Secondary | ICD-10-CM | POA: Diagnosis not present

## 2017-07-14 DIAGNOSIS — K5900 Constipation, unspecified: Secondary | ICD-10-CM

## 2017-07-14 DIAGNOSIS — R531 Weakness: Secondary | ICD-10-CM | POA: Diagnosis not present

## 2017-07-14 DIAGNOSIS — E875 Hyperkalemia: Secondary | ICD-10-CM

## 2017-07-14 DIAGNOSIS — C787 Secondary malignant neoplasm of liver and intrahepatic bile duct: Secondary | ICD-10-CM | POA: Diagnosis not present

## 2017-07-14 DIAGNOSIS — K219 Gastro-esophageal reflux disease without esophagitis: Secondary | ICD-10-CM

## 2017-07-14 DIAGNOSIS — Z7982 Long term (current) use of aspirin: Secondary | ICD-10-CM

## 2017-07-14 DIAGNOSIS — G4730 Sleep apnea, unspecified: Secondary | ICD-10-CM | POA: Diagnosis not present

## 2017-07-14 DIAGNOSIS — R948 Abnormal results of function studies of other organs and systems: Secondary | ICD-10-CM | POA: Diagnosis not present

## 2017-07-14 DIAGNOSIS — C23 Malignant neoplasm of gallbladder: Secondary | ICD-10-CM | POA: Diagnosis not present

## 2017-07-14 DIAGNOSIS — Z5111 Encounter for antineoplastic chemotherapy: Secondary | ICD-10-CM | POA: Diagnosis not present

## 2017-07-14 DIAGNOSIS — R0602 Shortness of breath: Secondary | ICD-10-CM | POA: Diagnosis not present

## 2017-07-14 DIAGNOSIS — I951 Orthostatic hypotension: Secondary | ICD-10-CM | POA: Diagnosis not present

## 2017-07-14 DIAGNOSIS — D6481 Anemia due to antineoplastic chemotherapy: Secondary | ICD-10-CM

## 2017-07-14 DIAGNOSIS — Z79899 Other long term (current) drug therapy: Secondary | ICD-10-CM

## 2017-07-14 DIAGNOSIS — E860 Dehydration: Secondary | ICD-10-CM

## 2017-07-14 DIAGNOSIS — K5090 Crohn's disease, unspecified, without complications: Secondary | ICD-10-CM | POA: Diagnosis not present

## 2017-07-14 DIAGNOSIS — C786 Secondary malignant neoplasm of retroperitoneum and peritoneum: Secondary | ICD-10-CM

## 2017-07-14 DIAGNOSIS — E7800 Pure hypercholesterolemia, unspecified: Secondary | ICD-10-CM

## 2017-07-14 DIAGNOSIS — F418 Other specified anxiety disorders: Secondary | ICD-10-CM

## 2017-07-14 DIAGNOSIS — G8929 Other chronic pain: Secondary | ICD-10-CM | POA: Diagnosis not present

## 2017-07-14 LAB — CBC WITH DIFFERENTIAL/PLATELET
Basophils Absolute: 0 10*3/uL (ref 0–0.1)
Basophils Relative: 0 %
EOS ABS: 0 10*3/uL (ref 0–0.7)
Eosinophils Relative: 0 %
HEMATOCRIT: 24.6 % — AB (ref 40.0–52.0)
HEMOGLOBIN: 8.6 g/dL — AB (ref 13.0–18.0)
LYMPHS ABS: 0.9 10*3/uL — AB (ref 1.0–3.6)
Lymphocytes Relative: 13 %
MCH: 35.4 pg — AB (ref 26.0–34.0)
MCHC: 35 g/dL (ref 32.0–36.0)
MCV: 101.1 fL — ABNORMAL HIGH (ref 80.0–100.0)
MONO ABS: 0.5 10*3/uL (ref 0.2–1.0)
MONOS PCT: 7 %
NEUTROS PCT: 80 %
Neutro Abs: 5.4 10*3/uL (ref 1.4–6.5)
Platelets: 216 10*3/uL (ref 150–440)
RBC: 2.43 MIL/uL — ABNORMAL LOW (ref 4.40–5.90)
RDW: 23.1 % — AB (ref 11.5–14.5)
WBC: 6.8 10*3/uL (ref 3.8–10.6)

## 2017-07-14 LAB — COMPREHENSIVE METABOLIC PANEL
ALBUMIN: 3.7 g/dL (ref 3.5–5.0)
ALT: 30 U/L (ref 17–63)
ANION GAP: 8 (ref 5–15)
AST: 31 U/L (ref 15–41)
Alkaline Phosphatase: 262 U/L — ABNORMAL HIGH (ref 38–126)
BUN: 31 mg/dL — ABNORMAL HIGH (ref 6–20)
CALCIUM: 9.1 mg/dL (ref 8.9–10.3)
CHLORIDE: 105 mmol/L (ref 101–111)
CO2: 18 mmol/L — AB (ref 22–32)
Creatinine, Ser: 1.04 mg/dL (ref 0.61–1.24)
GFR calc Af Amer: >60 mL/min (ref >60)
GFR calc non Af Amer: >60 mL/min (ref >60)
GLUCOSE: 143 mg/dL — AB (ref 65–99)
POTASSIUM: 4.7 mmol/L (ref 3.5–5.1)
SODIUM: 131 mmol/L — AB (ref 135–145)
Total Bilirubin: 0.5 mg/dL (ref 0.3–1.2)
Total Protein: 7.1 g/dL (ref 6.5–8.1)

## 2017-07-14 MED ORDER — SODIUM CHLORIDE 0.9 % IV SOLN
Freq: Once | INTRAVENOUS | Status: AC
  Administered 2017-07-14: 11:00:00 via INTRAVENOUS
  Filled 2017-07-14: qty 1000

## 2017-07-14 MED ORDER — SODIUM CHLORIDE 0.9% FLUSH
10.0000 mL | INTRAVENOUS | Status: DC | PRN
  Filled 2017-07-14: qty 10

## 2017-07-14 MED ORDER — LACTULOSE 10 GM/15ML PO SOLN: 10.0000 g | Freq: Every day | ORAL | 1 refills | Status: DC

## 2017-07-14 MED ORDER — SODIUM CHLORIDE 0.9 % IV SOLN
1600.0000 mg | Freq: Once | INTRAVENOUS | Status: AC
  Administered 2017-07-14: 1600 mg via INTRAVENOUS
  Filled 2017-07-14: qty 26.3

## 2017-07-14 MED ORDER — HEPARIN SOD (PORK) LOCK FLUSH 100 UNIT/ML IV SOLN
500.0000 [IU] | Freq: Once | INTRAVENOUS | Status: AC | PRN
  Administered 2017-07-14: 500 [IU]
  Filled 2017-07-14: qty 5

## 2017-07-14 MED ORDER — PROCHLORPERAZINE MALEATE 10 MG PO TABS
10.0000 mg | ORAL_TABLET | Freq: Once | ORAL | Status: AC
  Administered 2017-07-14: 10 mg via ORAL
  Filled 2017-07-14: qty 1

## 2017-07-14 NOTE — Progress Notes (Signed)
[place]  [phone] Fax:(336) [phone]  ID: Thomas David OB: [birth year]  MR#: [id number]  DVV#:[id number]  Patient Care Team: Leone Haven, MD as PCP - General (Family Medicine) Kathyrn Drown, MD (Family Medicine) Clent Jacks, RN as Registered Nurse  CHIEF COMPLAINT: Adenocarcinoma of gallbladder.  INTERVAL HISTORY: Patient returns to clinic today for further evaluation and consideration of cycle 4, day 1 of cisplatin and gemcitabine.  Gemcitabine only today.  He continues to tolerate his treatments relatively well.  He has chronic weakness and fatigue and occasional dyspnea on exertion. He has an improved appetite.  He has no neurologic complaints.  He denies any recent fevers or illnesses. He denies any chest pain.  He does not complain of abdominal pain today. He denies any nausea, vomiting, or diarrhea.  He has no urinary complaints.  Patient offers no further specific complaints today.  REVIEW OF SYSTEMS:   Review of Systems  Constitutional: Positive for malaise/fatigue. Negative for fever and weight loss.  Eyes: Negative for blurred vision.  Respiratory: Positive for shortness of breath. Negative for cough.   Cardiovascular: Negative.  Negative for chest pain and leg swelling.  Gastrointestinal: Positive for constipation. Negative for abdominal pain, blood in stool, heartburn, melena and nausea.  Genitourinary: Negative.  Negative for dysuria and frequency.  Musculoskeletal: Negative.  Negative for falls.  Skin: Negative.  Negative for rash.  Neurological: Positive for weakness. Negative for sensory change and focal weakness.  Psychiatric/Behavioral: Negative.  The patient is not nervous/anxious.     As per HPI. Otherwise, a complete review of systems is negative.  PAST MEDICAL HISTORY: Past Medical History:  Diagnosis Date  . Anxiety   . Arthritis   . Chronic lower back pain   . Crohn's disease ([place])   . Depression   .  Difficult intubation    patient denies on 02/12/17  . GERD (gastroesophageal reflux disease)   . Gout   . History of kidney stones 40 years ago  . Hypercholesteremia   . Hypertension   . Prediabetes   . Sleep apnea    cpap     PAST SURGICAL HISTORY: Past Surgical History:  Procedure Laterality Date  . CHOLECYSTECTOMY N/A 11/03/2016   Procedure: LAPAROSCOPIC CHOLECYSTostomy with tube placement ;  Surgeon: Johnathan Hausen, MD;  Location: [facility];  Service: General;  Laterality: N/A;  . CHOLECYSTECTOMY N/A 02/15/2017   Procedure: LAPAROSCOPIC SUBTOTAL CHOLECYSTECTOMY, BIOPSY OF GALLBLADDER;  Surgeon: Johnathan Hausen, MD;  Location: [facility];  Service: General;  Laterality: N/A;  . COLECTOMY  1973   "part of large intestines" (05/04/2012)  . COLON RESECTION    . COLONOSCOPY    . COLONOSCOPY N/A 05/23/2014   Procedure: COLONOSCOPY;  Surgeon: Rogene Houston, MD;  Location: [facility];  Service: Endoscopy;  Laterality: N/A;  1200  . ESOPHAGEAL DILATION N/A 05/23/2014   Procedure: ESOPHAGEAL DILATION;  Surgeon: Rogene Houston, MD;  [facility];  Service: Endoscopy;  Laterality: N/A;  . ESOPHAGOGASTRODUODENOSCOPY N/A 03/16/2014   Procedure: esophageal dilation ;  Surgeon: Rogene Houston, MD;  [facility];  Service: Endoscopy;  Laterality: N/A;  . ESOPHAGOGASTRODUODENOSCOPY N/A 05/23/2014   Procedure: ESOPHAGOGASTRODUODENOSCOPY (EGD);  Surgeon: Rogene Houston, MD;  [facility];  Service: Endoscopy;  Laterality: N/A;  . ESOPHAGOGASTRODUODENOSCOPY (EGD) WITH PROPOFOL N/A 02/18/2017   Procedure: ESOPHAGOGASTRODUODENOSCOPY (EGD) WITH PROPOFOL;  Surgeon: Wonda Horner, MD;  Location: [facility];  Service: Endoscopy;  Laterality: N/A;  . [place]  . IR CHOLANGIOGRAM EXISTING TUBE  11/26/2016  . KIDNEY STONE SURGERY  1970's  . PARTIAL COLECTOMY  ~ 40 years ago   some of large and small  . PORTA CATH INSERTION N/A 03/17/2017   Procedure: PORTA  CATH INSERTION;  Surgeon: Katha Cabal, MD;  Location: [facility];  Service: Cardiovascular;  Laterality: N/A;  . PROSTATE SURGERY    . THYROIDECTOMY Right 05/04/2012   Procedure: RIGHT HEMI THYROIDECTOMY;  Surgeon: Ascencion Dike, MD;  Location: [facility];  Service: ENT;  Laterality: Right;  . THYROIDECTOMY    . TONSILLECTOMY AND ADENOIDECTOMY     as a child  . TRANSURETHRAL PROSTATECTOMY WITH GYRUS INSTRUMENTS N/A 07/11/2012   Procedure: TRANSURETHRAL PROSTATECTOMY WITH GYRUS INSTRUMENTS;  Surgeon: Claybon Jabs, MD;  Location: [facility];  Service: Urology;  Laterality: N/A;  . UPPER GASTROINTESTINAL ENDOSCOPY      FAMILY HISTORY: Family History  Problem Relation Age of Onset  . Diabetes Mother   . Stroke Father   . Healthy Daughter   . Healthy Son   . Heart disease Sister     ADVANCED DIRECTIVES (Y/N):  N  HEALTH MAINTENANCE: Social History   Tobacco Use  . Smoking status: Never Smoker  . Smokeless tobacco: Never Used  Substance Use Topics  . Alcohol use: No    Alcohol/week: 0.0 oz  . Drug use: No     Colonoscopy:  PAP:  Bone density:  Lipid panel:  No Known Allergies  Current Outpatient Medications  Medication Sig Dispense Refill  . acetaminophen (TYLENOL) 500 MG tablet Take 1,000 mg by mouth every 4 (four) hours as needed for moderate pain or headache.     . allopurinol (ZYLOPRIM) 300 MG tablet Take 1 tablet (300 mg total) by mouth every morning. 90 tablet 3  . amLODipine (NORVASC) 5 MG tablet TAKE 1 TABLET BY MOUTH  DAILY 90 tablet 3  . aspirin EC 81 MG tablet Take 81 mg by mouth at bedtime.    . bisacodyl (DULCOLAX) 10 MG suppository Place 1 suppository (10 mg total) rectally daily as needed for moderate constipation. 12 suppository 0  . fentaNYL (DURAGESIC - DOSED MCG/HR) 25 MCG/HR patch Place 1 patch (25 mcg total) onto the skin every 3 (three) days. 10 patch 0  . fluticasone (FLONASE) 50 MCG/ACT nasal spray USE 1 SPRAY INTO BOTH  NOSTRILS DAILY 32 g 0    . Histamine Dihydrochloride (AUSTRALIAN DREAM ARTHRITIS EX) Apply 1 application topically 2 (two) times daily as needed (back pain).     Marland Kitchen HYDROcodone-acetaminophen (NORCO/VICODIN) 5-325 MG tablet Take 1-2 tablets by mouth every 4 (four) hours as needed for moderate pain. 30 tablet 0  . lidocaine-prilocaine (EMLA) cream Apply to port 1 hours prior to chemotherapy appointment. Cover with plastic wrap. 30 g 3  . losartan (COZAAR) 100 MG tablet Take 1 tablet (100 mg total) by mouth daily. 90 tablet 3  . megestrol (MEGACE) 40 MG tablet Take 1 tablet (40 mg total) by mouth daily. 30 tablet 2  . mesalamine (PENTASA) 500 MG CR capsule Take 1 capsule (500 mg total) by mouth 4 (four) times daily. (Patient taking differently: Take 1,000 mg by mouth 2 (two) times daily. ) 360 capsule 3  . metoprolol tartrate (LOPRESSOR) 50 MG tablet Take 1 tablet (50 mg total) by mouth 2 (two) times daily. 180 tablet 3  . Multiple Vitamin (MULTIVITAMIN WITH MINERALS) TABS tablet Take 2  tablets by mouth daily.    . ondansetron (ZOFRAN) 8 MG tablet Take 1 tablet (8 mg total) by mouth 2 (two) times daily. 30 tablet 3  . oxybutynin (DITROPAN-XL) 5 MG 24 hr tablet Take 1 tablet (5 mg total) by mouth at bedtime. 90 tablet 1  . pantoprazole (PROTONIX) 40 MG tablet Take 1 tablet (40 mg total) by mouth daily. 30 tablet 1  . simvastatin (ZOCOR) 20 MG tablet Take 1 tablet (20 mg total) by mouth daily. 90 tablet 3  . vitamin B-12 (CYANOCOBALAMIN) 1000 MCG tablet Take 1,000 mcg by mouth 2 (two) times daily.    Marland Kitchen lactulose (CHRONULAC) 10 GM/15ML solution Take 15 mLs (10 g total) by mouth daily. 240 mL 1   No current facility-administered medications for this visit.    Facility-Administered Medications Ordered in Other Visits  Medication Dose Route Frequency Provider Last Rate Last Dose  . heparin lock flush 100 unit/mL  500 Units Intravenous Once Faythe Casa E, NP      . sodium chloride flush (NS) 0.9 % injection 10 mL  10 mL  Intravenous PRN Jacquelin Hawking, NP   10 mL at 07/12/17 1445    OBJECTIVE: Vitals:   07/14/17 0957  BP: (!) 145/75  Pulse: 71  Resp: 18  Temp: (!) 96.8 F (36 C)     Body mass index is 27.06 kg/m.    ECOG FS:1 - Symptomatic but completely ambulatory  General: Well-developed, well-nourished, no acute distress. Eyes: Pink conjunctiva, anicteric sclera. Lungs: Clear to auscultation bilaterally. Heart: Regular rate and rhythm. No rubs, murmurs, or gallops. Abdomen: Soft, nontender, nondistended. No organomegaly noted, normoactive bowel sounds. Musculoskeletal: No edema, cyanosis, or clubbing. Neuro: Alert, answering all questions appropriately. Cranial nerves grossly intact. Skin: No rashes or petechiae noted. Psych: Normal affect.  LAB RESULTS:  Lab Results  Component Value Date   NA 131 (L) 07/14/2017   K 4.7 07/14/2017   CL 105 07/14/2017   CO2 18 (L) 07/14/2017   GLUCOSE 143 (H) 07/14/2017   BUN 31 (H) 07/14/2017   CREATININE 1.04 07/14/2017   CALCIUM 9.1 07/14/2017   PROT 7.1 07/14/2017   ALBUMIN 3.7 07/14/2017   AST 31 07/14/2017   ALT 30 07/14/2017   ALKPHOS 262 (H) 07/14/2017   BILITOT 0.5 07/14/2017   GFRNONAA >60 07/14/2017   GFRAA >60 07/14/2017    Lab Results  Component Value Date   WBC 6.8 07/14/2017   NEUTROABS 5.4 07/14/2017   HGB 8.6 (L) 07/14/2017   HCT 24.6 (L) 07/14/2017   MCV 101.1 (H) 07/14/2017   PLT 216 07/14/2017     STUDIES: Ct Angio Chest Pe W Or Wo Contrast  Result Date: 07/12/2017 CLINICAL DATA:  Shortness of breath for 2 weeks, history of carcinoma the gallbladder EXAM: CT ANGIOGRAPHY CHEST WITH CONTRAST TECHNIQUE: Multidetector CT imaging of the chest was performed using the standard protocol during bolus administration of intravenous contrast. Multiplanar CT image reconstructions and MIPs were obtained to evaluate the vascular anatomy. CONTRAST:  44m ISOVUE-370 IOPAMIDOL (ISOVUE-370) INJECTION 76% COMPARISON:  PET-CT of  07/06/2017 FINDINGS: Cardiovascular: The pulmonary arteries are well opacified. There is no evidence of acute pulmonary embolism. The thoracic aorta also opacifies well with no acute abnormality. Moderate thoracic aortic atherosclerosis is noted and diffuse coronary artery calcifications are present. Mediastinum/Nodes: There is a precarinal lymph node of 11 mm in short axis diameter. No other prominent lymph node is seen in this most likely is post inflammatory or post infectious  in origin. There is a small low-attenuation nodule within the left thyroid gland. The right thyroid gland is not seen and may have been resected previously. No hiatal hernia is noted. Lungs/Pleura: On lung window images, there is opacity abutting the minor fissure of the inferior anterior right upper lobe. This may represent scarring, atelectasis, or less likely pneumonia. Few bronchi due course through this region which appears slightly prominent and this may represent scarring from prior infection. No definite pneumonia or pleural effusion is currently seen. No suspicious lung nodule is noted. The airway is patent. Upper Abdomen: On limited views of the upper abdomen on this study, the liver is minimally prominent. No focal abnormality is seen. The gallbladder is somewhat distended. A large cyst emanates from the upper pole of the right kidney with small cyst emanating from the upper pole of the left kidney. Higher attenuation lesion emanates from the lateral posterior upper right kidney with attenuation of 33 HU. However, this area was not hypermetabolic on recent PET scan and this may simply represent a complex right renal cyst. Musculoskeletal: The thoracic vertebrae are in normal alignment with mild degenerative change. No compression deformity is seen. Review of the MIP images confirms the above findings. IMPRESSION: 1. No evidence of acute pulmonary embolism. 2. Moderately severe thoracic aortic atherosclerosis and diffuse coronary  artery calcifications. 3. Probable scarring or atelectasis abutting the minor fissure in the anterior inferior right upper lobe. 4. Bilateral renal cysts not well imaged on this CT the chest as noted above, at least 1 of which on the right appears complex. Electronically Signed   By: Ivar Drape M.D.   On: 07/12/2017 16:56   Nm Pet Image Restag (ps) Skull Base To Thigh  Result Date: 07/06/2017 CLINICAL DATA:  Subsequent treatment strategy for gallbladder carcinoma. EXAM: NUCLEAR MEDICINE PET SKULL BASE TO THIGH TECHNIQUE: 9.3 mCi F-18 FDG was injected intravenously. Full-ring PET imaging was performed from the skull base to thigh after the radiotracer. CT data was obtained and used for attenuation correction and anatomic localization. Fasting blood glucose: 87 mg/dl COMPARISON:  PET-CT 03/19/2017, CT 09/17/2016 FINDINGS: Mediastinal blood pool activity: SUV max 2.2 NECK: No hypermetabolic lymph nodes in the neck. Incidental CT findings: none CHEST: Decrease in nodularity in the RIGHT upper lobe. 6 mm nodule decreased from 7 mm (image 79/3). Band of atelectatic thickening in the RIGHT middle lobe is also unchanged. No hypermetabolic pulmonary nodules. Small nodule in the LEFT lower lobe measures 4 mm (image 114/3) which is not clearly seen on prior. There are two additional nodules adjacent which are new (image 111/3. Incidental CT findings: Coronary artery calcification and aortic atherosclerotic calcification. ABDOMEN/PELVIS: Interval decrease in the metabolic activity of hepatic metastasis in the LEFT hepatic lobe. No discrete metabolic activity above background liver activity. There is interval decrease in metabolic activity enlarged RIGHT adrenal gland with SUV max equal 5.8 decreased from 8.1. Near complete resolution of metabolic activity in the LEFT adrenal gland. The hypermetabolic omental thickening in the RIGHT upper quadrant is mildly decreased SUV max equal 4.0 decreased from 6.4. visually the  thickening is similar (image 182/3) Incidental CT findings: Large prostate gland. Atherosclerotic calcification of the aorta. Extensive renal cysts without metabolic activity SKELETON: No focal hypermetabolic activity to suggest skeletal metastasis. Incidental CT findings: none IMPRESSION: 1. Improvement hepatic metastasis with no discrete lesion above liver background metabolic activity. 2. Decrease in metabolic activity of RIGHT adrenal gland metastasis and resolution of metabolic activity in LEFT adrenal gland metastasis.  3. Mild decrease in metabolic activity of omental metastatic disease in the RIGHT upper quadrant with similar omental thickening. 4. Several new small pulmonary nodules in the LEFT lower lobe medially are indeterminate. Recommend attention on follow-up Electronically Signed   By: Suzy Bouchard M.D.   On: 07/06/2017 11:36    ASSESSMENT: Adenocarcinoma of gallbladder.  PLAN:    1. Adenocarcinoma of gallbladder: Restaging PET results from July 06, 2017 reviewed independently and report as above with improvement of disease burden.  Patient still has residual disease, therefore we will continue treatment as planned.   patient is receivinggemcitabine and cisplatin on day 1 with gemcitabine only on days 8 and 15.  This will be a 28-day cycle.  Cisplatin has been dose reduced to 40 mg/m given his difficulties with cycle 1.  Proceed with cycle 4, day 8 of treatment today which is gemcitabine only.  Return to clinic in 1 week for further evaluation and consideration of cycle 4, day 15.   2.  Poor appetite: Significantly improved.  Appreciate dietary input.  Continue Megace as needed. 3.  Hyperglycemia: Patient has significantly improved blood glucose control.  Monitor.   4.  Pain: Patient does not complain of this today.  Chronic and unchanged.  Continue fentanyl patch and hydrocodone as prescribed. 5.  Constipation: Continue Dulcolax as needed. 6.  Burping/indigestion: Patient does not  complain of this today.  Continue OTC treatment. 7.  Hyponatremia: Chronic and unchanged.  Monitor. 8.  Elevated liver enzymes: Resolved. 9.  Anemia: Hemoglobin remains decreased, but stable.  Monitor. 10.  Thrombocytopenia: Resolved. 11.  Weakness fatigue: Continue CARE program as needed.   Approximately 30 minutes was spent in discussion of which greater than 50% consultation.  Patient expressed understanding and was in agreement with this plan. He also understands that He can call clinic at any time with any questions, concerns, or complaints.   Cancer Staging Adenocarcinoma of gallbladder [facility]) Staging form: Gallbladder, AJCC 8th Edition - Clinical stage from 03/03/2017: Stage IVB (cT3, cN0, cM1) - Signed by Lloyd Huger, MD on 03/23/2017   Lloyd Huger, MD   07/17/2017 7:51 AM

## 2017-07-14 NOTE — Progress Notes (Signed)
Pt in for follow up, had fluids and zofran/decadron yesterday. Pt states "a little better today".  Reports still has had no "good" bowel movement for a week.

## 2017-07-16 ENCOUNTER — Other Ambulatory Visit: Payer: Self-pay | Admitting: *Deleted

## 2017-07-16 MED ORDER — LACTULOSE 10 GM/15ML PO SOLN: 10.0000 g | Freq: Every day | ORAL | 1 refills | Status: DC

## 2017-07-16 NOTE — Telephone Encounter (Signed)
This was filled 07/14/17 for #240 ml with 1 refill Does he need it?

## 2017-07-18 NOTE — Progress Notes (Signed)
[place]  [phone] [phone]  ID: Hoby Kawai Seiler OB: [birth year]  MR#: [id number]  VOJ#:[id number]  Patient Care Team: Leone Haven, MD as PCP - General (Family Medicine) Kathyrn Drown, MD (Family Medicine) Clent Jacks, RN as Registered Nurse  CHIEF COMPLAINT: Adenocarcinoma of gallbladder.  INTERVAL HISTORY: Patient returns to clinic today for further evaluation and consideration of cycle 4, day 15 of cisplatin and gemcitabine.  Gemcitabine only today.  He has worsening weakness and fatigue this week despite a dose reduction of gemcitabine.  He continues to have shortness of breath and dyspnea on exertion. He has an improved appetite.  He has no neurologic complaints.  He denies any recent fevers or illnesses. He denies any chest pain.  He does not complain of abdominal pain today. He denies any nausea, vomiting, or diarrhea.  He has no urinary complaints.  Patient offers no further specific complaints today.   REVIEW OF SYSTEMS:   Review of Systems  Constitutional: Positive for malaise/fatigue. Negative for fever and weight loss.  Eyes: Negative for blurred vision.  Respiratory: Positive for shortness of breath. Negative for cough.   Cardiovascular: Negative.  Negative for chest pain and leg swelling.  Gastrointestinal: Positive for constipation. Negative for abdominal pain, blood in stool, heartburn, melena and nausea.  Genitourinary: Negative.  Negative for dysuria and frequency.  Musculoskeletal: Negative.  Negative for falls.  Skin: Negative.  Negative for rash.  Neurological: Positive for weakness. Negative for sensory change and focal weakness.  Psychiatric/Behavioral: Negative.  The patient is not nervous/anxious.     As per HPI. Otherwise, a complete review of systems is negative.  PAST MEDICAL HISTORY: Past Medical History:  Diagnosis Date  . Anxiety   . Arthritis   . Chronic lower back pain   . Crohn's disease  ([place])   . Depression   . Difficult intubation    patient denies on 02/12/17  . GERD (gastroesophageal reflux disease)   . Gout   . History of kidney stones 40 years ago  . Hypercholesteremia   . Hypertension   . Prediabetes   . Sleep apnea    cpap     PAST SURGICAL HISTORY: Past Surgical History:  Procedure Laterality Date  . CHOLECYSTECTOMY N/A 11/03/2016   Procedure: LAPAROSCOPIC CHOLECYSTostomy with tube placement ;  Surgeon: Johnathan Hausen, MD;  Location: [facility];  Service: General;  Laterality: N/A;  . CHOLECYSTECTOMY N/A 02/15/2017   Procedure: LAPAROSCOPIC SUBTOTAL CHOLECYSTECTOMY, BIOPSY OF GALLBLADDER;  Surgeon: Johnathan Hausen, MD;  Location: [facility];  Service: General;  Laterality: N/A;  . COLECTOMY  1973   "part of large intestines" (05/04/2012)  . COLON RESECTION    . COLONOSCOPY    . COLONOSCOPY N/A 05/23/2014   Procedure: COLONOSCOPY;  Surgeon: Rogene Houston, MD;  [facility]: [facility];  Service: Endoscopy;  Laterality: N/A;  1200  . ESOPHAGEAL DILATION N/A 05/23/2014   Procedure: ESOPHAGEAL DILATION;  Surgeon: Rogene Houston, MD;  [facility];  Service: Endoscopy;  Laterality: N/A;  . ESOPHAGOGASTRODUODENOSCOPY N/A 03/16/2014   Procedure: esophageal dilation ;  Surgeon: Rogene Houston, MD;  [facility];  Service: Endoscopy;  Laterality: N/A;  . ESOPHAGOGASTRODUODENOSCOPY N/A 05/23/2014   Procedure: ESOPHAGOGASTRODUODENOSCOPY (EGD);  Surgeon: Rogene Houston, MD;  [facility];  Service: Endoscopy;  Laterality: N/A;  . ESOPHAGOGASTRODUODENOSCOPY (EGD) WITH PROPOFOL N/A 02/18/2017   Procedure: ESOPHAGOGASTRODUODENOSCOPY (EGD) WITH PROPOFOL;  Surgeon: Wonda Horner, MD;  Location: [facility];  Service: Endoscopy;  Laterality: N/A;  . [place]  . IR CHOLANGIOGRAM EXISTING TUBE  11/26/2016  . KIDNEY STONE SURGERY  1970's  . PARTIAL COLECTOMY  ~ 40 years ago   some of large and small  . PORTA CATH INSERTION N/A  03/17/2017   Procedure: PORTA CATH INSERTION;  Surgeon: Katha Cabal, MD;  Location: [facility];  Service: Cardiovascular;  Laterality: N/A;  . PROSTATE SURGERY    . THYROIDECTOMY Right 05/04/2012   Procedure: RIGHT HEMI THYROIDECTOMY;  Surgeon: Ascencion Dike, MD;  Location: [facility];  Service: ENT;  Laterality: Right;  . THYROIDECTOMY    . TONSILLECTOMY AND ADENOIDECTOMY     as a child  . TRANSURETHRAL PROSTATECTOMY WITH GYRUS INSTRUMENTS N/A 07/11/2012   Procedure: TRANSURETHRAL PROSTATECTOMY WITH GYRUS INSTRUMENTS;  Surgeon: Claybon Jabs, MD;  Location: [facility];  Service: Urology;  Laterality: N/A;  . UPPER GASTROINTESTINAL ENDOSCOPY      FAMILY HISTORY: Family History  Problem Relation Age of Onset  . Diabetes Mother   . Stroke Father   . Healthy Daughter   . Healthy Son   . Heart disease Sister     ADVANCED DIRECTIVES (Y/N):  N  HEALTH MAINTENANCE: Social History   Tobacco Use  . Smoking status: Never Smoker  . Smokeless tobacco: Never Used  Substance Use Topics  . Alcohol use: No    Alcohol/week: 0.0 oz  . Drug use: No     Colonoscopy:  PAP:  Bone density:  Lipid panel:  No Known Allergies  Current Outpatient Medications  Medication Sig Dispense Refill  . acetaminophen (TYLENOL) 500 MG tablet Take 1,000 mg by mouth every 4 (four) hours as needed for moderate pain or headache.     . allopurinol (ZYLOPRIM) 300 MG tablet Take 1 tablet (300 mg total) by mouth every morning. 90 tablet 3  . amLODipine (NORVASC) 5 MG tablet TAKE 1 TABLET BY MOUTH  DAILY 90 tablet 3  . aspirin EC 81 MG tablet Take 81 mg by mouth at bedtime.    . fluticasone (FLONASE) 50 MCG/ACT nasal spray USE 1 SPRAY INTO BOTH  NOSTRILS DAILY 32 g 0  . Histamine Dihydrochloride (AUSTRALIAN DREAM ARTHRITIS EX) Apply 1 application topically 2 (two) times daily as needed (back pain).     Marland Kitchen lactulose (CHRONULAC) 10 GM/15ML solution Take 15 mLs (10 g total) by mouth daily. (Patient not taking:  Reported on 07/23/2017) 240 mL 1  . lidocaine-prilocaine (EMLA) cream Apply to port 1 hours prior to chemotherapy appointment. Cover with plastic wrap. 30 g 3  . losartan (COZAAR) 100 MG tablet Take 1 tablet (100 mg total) by mouth daily. 90 tablet 3  . megestrol (MEGACE) 40 MG tablet Take 1 tablet (40 mg total) by mouth daily. 30 tablet 2  . mesalamine (PENTASA) 500 MG CR capsule Take 1 capsule (500 mg total) by mouth 4 (four) times daily. (Patient taking differently: Take 1,000 mg by mouth 2 (two) times daily. ) 360 capsule 3  . metoprolol tartrate (LOPRESSOR) 50 MG tablet Take 1 tablet (50 mg total) by mouth 2 (two) times daily. 180 tablet 3  . Multiple Vitamin (MULTIVITAMIN WITH MINERALS) TABS tablet Take 2 tablets by mouth daily.    . ondansetron (ZOFRAN) 8 MG tablet Take 1 tablet (8 mg total) by mouth 2 (two) times daily. 30 tablet 3  . oxybutynin (DITROPAN-XL) 5 MG 24 hr tablet Take 1 tablet (5 mg  total) by mouth at bedtime. 90 tablet 1  . pantoprazole (PROTONIX) 40 MG tablet Take 1 tablet (40 mg total) by mouth daily. 30 tablet 1  . simvastatin (ZOCOR) 20 MG tablet Take 1 tablet (20 mg total) by mouth daily. 90 tablet 3  . vitamin B-12 (CYANOCOBALAMIN) 1000 MCG tablet Take 1,000 mcg by mouth 2 (two) times daily.    . bisacodyl (DULCOLAX) 10 MG suppository Place 1 suppository (10 mg total) rectally daily as needed for moderate constipation. 12 suppository 0  . fentaNYL (DURAGESIC - DOSED MCG/HR) 25 MCG/HR patch Place 1 patch (25 mcg total) onto the skin every 3 (three) days. 10 patch 0  . HYDROcodone-acetaminophen (NORCO/VICODIN) 5-325 MG tablet Take 1-2 tablets by mouth every 4 (four) hours as needed for moderate pain. 30 tablet 0   No current facility-administered medications for this visit.    Facility-Administered Medications Ordered in Other Visits  Medication Dose Route Frequency Provider Last Rate Last Dose  . heparin lock flush 100 unit/mL  500 Units Intravenous Once Faythe Casa  E, NP      . sodium chloride flush (NS) 0.9 % injection 10 mL  10 mL Intravenous PRN Jacquelin Hawking, NP   10 mL at 07/12/17 1445    OBJECTIVE: Vitals:   07/21/17 0938  BP: 132/65  Pulse: 96  Resp: (!) 22  Temp: 98 F (36.7 C)  SpO2: 100%     Body mass index is 26.42 kg/m.    ECOG FS:1 - Symptomatic but completely ambulatory  General: Well-developed, well-nourished, no acute distress. Eyes: Pink conjunctiva, anicteric sclera. Lungs: Clear to auscultation bilaterally. Heart: Regular rate and rhythm. No rubs, murmurs, or gallops. Abdomen: Soft, nontender, nondistended. No organomegaly noted, normoactive bowel sounds. Musculoskeletal: No edema, cyanosis, or clubbing. Neuro: Alert, answering all questions appropriately. Cranial nerves grossly intact. Skin: No rashes or petechiae noted. Psych: Normal affect.  LAB RESULTS:  Lab Results  Component Value Date   NA 136 07/23/2017   K 5.4 (H) 07/23/2017   CL 112 (H) 07/23/2017   CO2 18 (L) 07/23/2017   GLUCOSE 116 (H) 07/23/2017   BUN 32 (H) 07/23/2017   CREATININE 1.10 07/23/2017   CALCIUM 8.9 07/23/2017   PROT 7.0 07/23/2017   ALBUMIN 3.7 07/23/2017   AST 35 07/23/2017   ALT 35 07/23/2017   ALKPHOS 282 (H) 07/23/2017   BILITOT 0.5 07/23/2017   GFRNONAA >60 07/23/2017   GFRAA >60 07/23/2017    Lab Results  Component Value Date   WBC 3.0 (L) 07/23/2017   NEUTROABS 1.9 07/23/2017   HGB 8.6 (L) 07/23/2017   HCT 24.9 (L) 07/23/2017   MCV 101.5 (H) 07/23/2017   PLT 53 (L) 07/23/2017     STUDIES: Ct Angio Chest Pe W Or Wo Contrast  Result Date: 07/12/2017 CLINICAL DATA:  Shortness of breath for 2 weeks, history of carcinoma the gallbladder EXAM: CT ANGIOGRAPHY CHEST WITH CONTRAST TECHNIQUE: Multidetector CT imaging of the chest was performed using the standard protocol during bolus administration of intravenous contrast. Multiplanar CT image reconstructions and MIPs were obtained to evaluate the vascular anatomy.  CONTRAST:  19m ISOVUE-370 IOPAMIDOL (ISOVUE-370) INJECTION 76% COMPARISON:  PET-CT of 07/06/2017 FINDINGS: Cardiovascular: The pulmonary arteries are well opacified. There is no evidence of acute pulmonary embolism. The thoracic aorta also opacifies well with no acute abnormality. Moderate thoracic aortic atherosclerosis is noted and diffuse coronary artery calcifications are present. Mediastinum/Nodes: There is a precarinal lymph node of 11 mm in short axis diameter.  No other prominent lymph node is seen in this most likely is post inflammatory or post infectious in origin. There is a small low-attenuation nodule within the left thyroid gland. The right thyroid gland is not seen and may have been resected previously. No hiatal hernia is noted. Lungs/Pleura: On lung window images, there is opacity abutting the minor fissure of the inferior anterior right upper lobe. This may represent scarring, atelectasis, or less likely pneumonia. Few bronchi due course through this region which appears slightly prominent and this may represent scarring from prior infection. No definite pneumonia or pleural effusion is currently seen. No suspicious lung nodule is noted. The airway is patent. Upper Abdomen: On limited views of the upper abdomen on this study, the liver is minimally prominent. No focal abnormality is seen. The gallbladder is somewhat distended. A large cyst emanates from the upper pole of the right kidney with small cyst emanating from the upper pole of the left kidney. Higher attenuation lesion emanates from the lateral posterior upper right kidney with attenuation of 33 HU. However, this area was not hypermetabolic on recent PET scan and this may simply represent a complex right renal cyst. Musculoskeletal: The thoracic vertebrae are in normal alignment with mild degenerative change. No compression deformity is seen. Review of the MIP images confirms the above findings. IMPRESSION: 1. No evidence of acute  pulmonary embolism. 2. Moderately severe thoracic aortic atherosclerosis and diffuse coronary artery calcifications. 3. Probable scarring or atelectasis abutting the minor fissure in the anterior inferior right upper lobe. 4. Bilateral renal cysts not well imaged on this CT the chest as noted above, at least 1 of which on the right appears complex. Electronically Signed   By: Ivar Drape M.D.   On: 07/12/2017 16:56   Nm Pet Image Restag (ps) Skull Base To Thigh  Result Date: 07/06/2017 CLINICAL DATA:  Subsequent treatment strategy for gallbladder carcinoma. EXAM: NUCLEAR MEDICINE PET SKULL BASE TO THIGH TECHNIQUE: 9.3 mCi F-18 FDG was injected intravenously. Full-ring PET imaging was performed from the skull base to thigh after the radiotracer. CT data was obtained and used for attenuation correction and anatomic localization. Fasting blood glucose: 87 mg/dl COMPARISON:  PET-CT 03/19/2017, CT 09/17/2016 FINDINGS: Mediastinal blood pool activity: SUV max 2.2 NECK: No hypermetabolic lymph nodes in the neck. Incidental CT findings: none CHEST: Decrease in nodularity in the RIGHT upper lobe. 6 mm nodule decreased from 7 mm (image 79/3). Band of atelectatic thickening in the RIGHT middle lobe is also unchanged. No hypermetabolic pulmonary nodules. Small nodule in the LEFT lower lobe measures 4 mm (image 114/3) which is not clearly seen on prior. There are two additional nodules adjacent which are new (image 111/3. Incidental CT findings: Coronary artery calcification and aortic atherosclerotic calcification. ABDOMEN/PELVIS: Interval decrease in the metabolic activity of hepatic metastasis in the LEFT hepatic lobe. No discrete metabolic activity above background liver activity. There is interval decrease in metabolic activity enlarged RIGHT adrenal gland with SUV max equal 5.8 decreased from 8.1. Near complete resolution of metabolic activity in the LEFT adrenal gland. The hypermetabolic omental thickening in the RIGHT  upper quadrant is mildly decreased SUV max equal 4.0 decreased from 6.4. visually the thickening is similar (image 182/3) Incidental CT findings: Large prostate gland. Atherosclerotic calcification of the aorta. Extensive renal cysts without metabolic activity SKELETON: No focal hypermetabolic activity to suggest skeletal metastasis. Incidental CT findings: none IMPRESSION: 1. Improvement hepatic metastasis with no discrete lesion above liver background metabolic activity. 2. Decrease in  metabolic activity of RIGHT adrenal gland metastasis and resolution of metabolic activity in LEFT adrenal gland metastasis. 3. Mild decrease in metabolic activity of omental metastatic disease in the RIGHT upper quadrant with similar omental thickening. 4. Several new small pulmonary nodules in the LEFT lower lobe medially are indeterminate. Recommend attention on follow-up Electronically Signed   By: Suzy Bouchard M.D.   On: 07/06/2017 11:36    ASSESSMENT: Adenocarcinoma of gallbladder.  PLAN:    1. Adenocarcinoma of gallbladder: Restaging PET results from July 06, 2017 reviewed independently and report as above with improvement of disease burden.  Patient still has residual disease, therefore we will continue treatment as planned.  Both gemcitabine and cisplatin have been dose reduced.  He continues to receive cisplatin and gemcitabine on day 1 and then gemcitabine only on days 8 and day 15.  This is a 28-day cycle.  Patient will not receive cycle 4, day 15 of treatment today given decreased performance status and pancytopenia.  Patient will receive 1 unit of packed red blood cells today instead.  Keep follow-up as scheduled for consideration of cycle 5, day 1 on August 11, 2017.  2.  Poor appetite: Significantly improved.  Appreciate dietary input.  Continue Megace as needed. 3.  Hyperglycemia: Patient has significantly improved blood glucose control.  Monitor.   4.  Pain: Patient does not complain of this today.   Chronic and unchanged.  Continue fentanyl patch and hydrocodone as prescribed. 5.  Constipation: Continue Dulcolax as needed. 6.  Burping/indigestion: Patient does not complain of this today.  Continue OTC treatment. 7.  Hyponatremia: Mildly improved, monitor. 8.  Elevated liver enzymes: Resolved. 9.  Anemia: Patient's hemoglobin is trending down and he is symptomatic.  Proceed with 1 unit packed red blood cells as above. 10.  Thrombocytopenia: Secondary to chemotherapy.  Hold treatment today.  Both gemcitabine and cisplatin have been dose reduced. 11.  Neutropenia: Hold treatment as above. 12.  Weakness and fatigue: Continue CARE program as ordered.  Patient expressed understanding and was in agreement with this plan. He also understands that He can call clinic at any time with any questions, concerns, or complaints.   Cancer Staging Adenocarcinoma of gallbladder [facility]) Staging form: Gallbladder, AJCC 8th Edition - Clinical stage from 03/03/2017: Stage IVB (cT3, cN0, cM1) - Signed by Lloyd Huger, MD on 03/23/2017   Lloyd Huger, MD   07/25/2017 9:18 AM

## 2017-07-21 ENCOUNTER — Other Ambulatory Visit: Payer: Self-pay

## 2017-07-21 ENCOUNTER — Inpatient Hospital Stay: Payer: Medicare Other

## 2017-07-21 ENCOUNTER — Inpatient Hospital Stay (HOSPITAL_BASED_OUTPATIENT_CLINIC_OR_DEPARTMENT_OTHER): Payer: Medicare Other | Admitting: Oncology

## 2017-07-21 VITALS — BP 113/66 | HR 80 | Temp 97.9°F | Resp 18

## 2017-07-21 VITALS — BP 132/65 | HR 96 | Temp 98.0°F | Resp 22 | Ht 69.0 in | Wt 178.9 lb

## 2017-07-21 DIAGNOSIS — E875 Hyperkalemia: Secondary | ICD-10-CM | POA: Diagnosis not present

## 2017-07-21 DIAGNOSIS — R531 Weakness: Secondary | ICD-10-CM

## 2017-07-21 DIAGNOSIS — K5900 Constipation, unspecified: Secondary | ICD-10-CM

## 2017-07-21 DIAGNOSIS — K219 Gastro-esophageal reflux disease without esophagitis: Secondary | ICD-10-CM | POA: Diagnosis not present

## 2017-07-21 DIAGNOSIS — R5383 Other fatigue: Secondary | ICD-10-CM

## 2017-07-21 DIAGNOSIS — G4730 Sleep apnea, unspecified: Secondary | ICD-10-CM | POA: Diagnosis not present

## 2017-07-21 DIAGNOSIS — Z7982 Long term (current) use of aspirin: Secondary | ICD-10-CM

## 2017-07-21 DIAGNOSIS — G8929 Other chronic pain: Secondary | ICD-10-CM | POA: Diagnosis not present

## 2017-07-21 DIAGNOSIS — K5090 Crohn's disease, unspecified, without complications: Secondary | ICD-10-CM | POA: Diagnosis not present

## 2017-07-21 DIAGNOSIS — Z5111 Encounter for antineoplastic chemotherapy: Secondary | ICD-10-CM | POA: Diagnosis not present

## 2017-07-21 DIAGNOSIS — M129 Arthropathy, unspecified: Secondary | ICD-10-CM | POA: Diagnosis not present

## 2017-07-21 DIAGNOSIS — D649 Anemia, unspecified: Secondary | ICD-10-CM

## 2017-07-21 DIAGNOSIS — E7800 Pure hypercholesterolemia, unspecified: Secondary | ICD-10-CM | POA: Diagnosis not present

## 2017-07-21 DIAGNOSIS — C786 Secondary malignant neoplasm of retroperitoneum and peritoneum: Secondary | ICD-10-CM | POA: Diagnosis not present

## 2017-07-21 DIAGNOSIS — R948 Abnormal results of function studies of other organs and systems: Secondary | ICD-10-CM

## 2017-07-21 DIAGNOSIS — C787 Secondary malignant neoplasm of liver and intrahepatic bile duct: Secondary | ICD-10-CM

## 2017-07-21 DIAGNOSIS — I951 Orthostatic hypotension: Secondary | ICD-10-CM

## 2017-07-21 DIAGNOSIS — E860 Dehydration: Secondary | ICD-10-CM | POA: Diagnosis not present

## 2017-07-21 DIAGNOSIS — Z79899 Other long term (current) drug therapy: Secondary | ICD-10-CM | POA: Diagnosis not present

## 2017-07-21 DIAGNOSIS — R0602 Shortness of breath: Secondary | ICD-10-CM

## 2017-07-21 DIAGNOSIS — C23 Malignant neoplasm of gallbladder: Secondary | ICD-10-CM | POA: Diagnosis not present

## 2017-07-21 DIAGNOSIS — Z87442 Personal history of urinary calculi: Secondary | ICD-10-CM | POA: Diagnosis not present

## 2017-07-21 DIAGNOSIS — R110 Nausea: Secondary | ICD-10-CM

## 2017-07-21 DIAGNOSIS — F418 Other specified anxiety disorders: Secondary | ICD-10-CM

## 2017-07-21 DIAGNOSIS — D6481 Anemia due to antineoplastic chemotherapy: Secondary | ICD-10-CM

## 2017-07-21 LAB — SAMPLE TO BLOOD BANK

## 2017-07-21 LAB — COMPREHENSIVE METABOLIC PANEL
ALK PHOS: 252 U/L — AB (ref 38–126)
ALT: 34 U/L (ref 17–63)
AST: 39 U/L (ref 15–41)
Albumin: 3.6 g/dL (ref 3.5–5.0)
Anion gap: 7 (ref 5–15)
BUN: 30 mg/dL — AB (ref 6–20)
CHLORIDE: 109 mmol/L (ref 101–111)
CO2: 18 mmol/L — AB (ref 22–32)
CREATININE: 1.17 mg/dL (ref 0.61–1.24)
Calcium: 9 mg/dL (ref 8.9–10.3)
GFR calc Af Amer: >60 mL/min (ref >60)
GFR calc non Af Amer: 57 mL/min — ABNORMAL LOW (ref >60)
Glucose, Bld: 136 mg/dL — ABNORMAL HIGH (ref 65–99)
Potassium: 4.1 mmol/L (ref 3.5–5.1)
Sodium: 134 mmol/L — ABNORMAL LOW (ref 135–145)
Total Bilirubin: 0.6 mg/dL (ref 0.3–1.2)
Total Protein: 6.9 g/dL (ref 6.5–8.1)

## 2017-07-21 LAB — CBC WITH DIFFERENTIAL/PLATELET
Basophils Absolute: 0 10*3/uL (ref 0–0.1)
Basophils Relative: 1 %
EOS ABS: 0 10*3/uL (ref 0–0.7)
EOS PCT: 0 %
HCT: 22.2 % — ABNORMAL LOW (ref 40.0–52.0)
Hemoglobin: 7.8 g/dL — ABNORMAL LOW (ref 13.0–18.0)
LYMPHS ABS: 0.5 10*3/uL — AB (ref 1.0–3.6)
Lymphocytes Relative: 27 %
MCH: 36.1 pg — AB (ref 26.0–34.0)
MCHC: 35.1 g/dL (ref 32.0–36.0)
MCV: 102.8 fL — ABNORMAL HIGH (ref 80.0–100.0)
MONOS PCT: 5 %
Monocytes Absolute: 0.1 10*3/uL — ABNORMAL LOW (ref 0.2–1.0)
Neutro Abs: 1.3 10*3/uL — ABNORMAL LOW (ref 1.4–6.5)
Neutrophils Relative %: 67 %
PLATELETS: 64 10*3/uL — AB (ref 150–440)
RBC: 2.16 MIL/uL — ABNORMAL LOW (ref 4.40–5.90)
RDW: 20.6 % — ABNORMAL HIGH (ref 11.5–14.5)
WBC: 2 10*3/uL — ABNORMAL LOW (ref 3.8–10.6)

## 2017-07-21 LAB — PREPARE RBC (CROSSMATCH)

## 2017-07-21 MED ORDER — HYDROCODONE-ACETAMINOPHEN 5-325 MG PO TABS: 1.0000 | ORAL_TABLET | ORAL | 0 refills | Status: DC | PRN

## 2017-07-21 MED ORDER — DIPHENHYDRAMINE HCL 50 MG/ML IJ SOLN
25.0000 mg | Freq: Once | INTRAMUSCULAR | Status: AC
  Administered 2017-07-21: 25 mg via INTRAVENOUS
  Filled 2017-07-21: qty 1

## 2017-07-21 MED ORDER — HEPARIN SOD (PORK) LOCK FLUSH 100 UNIT/ML IV SOLN
500.0000 [IU] | Freq: Once | INTRAVENOUS | Status: AC
  Administered 2017-07-21: 500 [IU] via INTRAVENOUS
  Filled 2017-07-21: qty 5

## 2017-07-21 MED ORDER — SODIUM CHLORIDE 0.9% FLUSH
10.0000 mL | Freq: Once | INTRAVENOUS | Status: AC
  Administered 2017-07-21: 10 mL via INTRAVENOUS
  Filled 2017-07-21: qty 10

## 2017-07-21 MED ORDER — FENTANYL 25 MCG/HR TD PT72: 25.0000 ug | MEDICATED_PATCH | TRANSDERMAL | 0 refills | Status: DC

## 2017-07-21 MED ORDER — ACETAMINOPHEN 325 MG PO TABS
650.0000 mg | ORAL_TABLET | Freq: Once | ORAL | Status: AC
  Administered 2017-07-21: 650 mg via ORAL
  Filled 2017-07-21: qty 2

## 2017-07-21 MED ORDER — LIDOCAINE-PRILOCAINE 2.5-2.5 % EX CREA: TOPICAL_CREAM | CUTANEOUS | 3 refills | Status: DC

## 2017-07-21 MED ORDER — SODIUM CHLORIDE 0.9 % IV SOLN
Freq: Once | INTRAVENOUS | Status: AC
  Administered 2017-07-21: 11:00:00 via INTRAVENOUS
  Filled 2017-07-21: qty 1000

## 2017-07-22 ENCOUNTER — Telehealth: Payer: Self-pay

## 2017-07-22 LAB — BPAM RBC
Blood Product Expiration Date: 201906222359
ISSUE DATE / TIME: 201906191113
Unit Type and Rh: 9500

## 2017-07-22 LAB — TYPE AND SCREEN
ABO/RH(D): O POS
Antibody Screen: NEGATIVE
Unit division: 0

## 2017-07-22 NOTE — Telephone Encounter (Signed)
Nutrition  Patient left message for RD to return call.   RD spoke with patient this am.  Patient appreciative of RD calling him several weeks ago and getting the ball rolling to have him checked out for his shortness of breath.  Reports that he is improving but still having some issues with shortness of breath.  Reports that he is coming to clinic tomorrow and requesting ensure.    Chart reviewed and nutrition still a concern for patient.    Discussed with patient that RD will leave a case of ensure enlive for him to pick up tomorrow.    Brittny Spangle B. Zenia Resides, [place], [place] Registered Dietitian [phone] (pager)

## 2017-07-23 ENCOUNTER — Inpatient Hospital Stay (HOSPITAL_BASED_OUTPATIENT_CLINIC_OR_DEPARTMENT_OTHER): Payer: Medicare Other | Admitting: Oncology

## 2017-07-23 ENCOUNTER — Other Ambulatory Visit: Payer: Self-pay

## 2017-07-23 ENCOUNTER — Encounter: Payer: Self-pay | Admitting: Oncology

## 2017-07-23 ENCOUNTER — Inpatient Hospital Stay: Payer: Medicare Other

## 2017-07-23 VITALS — BP 119/73 | HR 76 | Temp 98.1°F | Resp 20 | Wt 180.0 lb

## 2017-07-23 DIAGNOSIS — D6481 Anemia due to antineoplastic chemotherapy: Secondary | ICD-10-CM

## 2017-07-23 DIAGNOSIS — K5900 Constipation, unspecified: Secondary | ICD-10-CM | POA: Diagnosis not present

## 2017-07-23 DIAGNOSIS — F418 Other specified anxiety disorders: Secondary | ICD-10-CM

## 2017-07-23 DIAGNOSIS — M129 Arthropathy, unspecified: Secondary | ICD-10-CM | POA: Diagnosis not present

## 2017-07-23 DIAGNOSIS — K5909 Other constipation: Secondary | ICD-10-CM

## 2017-07-23 DIAGNOSIS — G4730 Sleep apnea, unspecified: Secondary | ICD-10-CM | POA: Diagnosis not present

## 2017-07-23 DIAGNOSIS — K5090 Crohn's disease, unspecified, without complications: Secondary | ICD-10-CM

## 2017-07-23 DIAGNOSIS — Z7982 Long term (current) use of aspirin: Secondary | ICD-10-CM | POA: Diagnosis not present

## 2017-07-23 DIAGNOSIS — R531 Weakness: Secondary | ICD-10-CM

## 2017-07-23 DIAGNOSIS — Z79899 Other long term (current) drug therapy: Secondary | ICD-10-CM | POA: Diagnosis not present

## 2017-07-23 DIAGNOSIS — C786 Secondary malignant neoplasm of retroperitoneum and peritoneum: Secondary | ICD-10-CM | POA: Diagnosis not present

## 2017-07-23 DIAGNOSIS — R110 Nausea: Secondary | ICD-10-CM | POA: Diagnosis not present

## 2017-07-23 DIAGNOSIS — R5383 Other fatigue: Secondary | ICD-10-CM | POA: Diagnosis not present

## 2017-07-23 DIAGNOSIS — I951 Orthostatic hypotension: Secondary | ICD-10-CM | POA: Diagnosis not present

## 2017-07-23 DIAGNOSIS — C23 Malignant neoplasm of gallbladder: Secondary | ICD-10-CM

## 2017-07-23 DIAGNOSIS — E7800 Pure hypercholesterolemia, unspecified: Secondary | ICD-10-CM

## 2017-07-23 DIAGNOSIS — K219 Gastro-esophageal reflux disease without esophagitis: Secondary | ICD-10-CM | POA: Diagnosis not present

## 2017-07-23 DIAGNOSIS — E875 Hyperkalemia: Secondary | ICD-10-CM | POA: Diagnosis not present

## 2017-07-23 DIAGNOSIS — R0602 Shortness of breath: Secondary | ICD-10-CM

## 2017-07-23 DIAGNOSIS — Z87442 Personal history of urinary calculi: Secondary | ICD-10-CM

## 2017-07-23 DIAGNOSIS — C787 Secondary malignant neoplasm of liver and intrahepatic bile duct: Secondary | ICD-10-CM | POA: Diagnosis not present

## 2017-07-23 DIAGNOSIS — E860 Dehydration: Secondary | ICD-10-CM

## 2017-07-23 DIAGNOSIS — R948 Abnormal results of function studies of other organs and systems: Secondary | ICD-10-CM

## 2017-07-23 DIAGNOSIS — Z5111 Encounter for antineoplastic chemotherapy: Secondary | ICD-10-CM | POA: Diagnosis not present

## 2017-07-23 DIAGNOSIS — G8929 Other chronic pain: Secondary | ICD-10-CM

## 2017-07-23 DIAGNOSIS — D649 Anemia, unspecified: Secondary | ICD-10-CM

## 2017-07-23 LAB — COMPREHENSIVE METABOLIC PANEL
ALBUMIN: 3.7 g/dL (ref 3.5–5.0)
ALT: 35 U/L (ref 17–63)
AST: 35 U/L (ref 15–41)
Alkaline Phosphatase: 282 U/L — ABNORMAL HIGH (ref 38–126)
Anion gap: 6 (ref 5–15)
BUN: 32 mg/dL — AB (ref 6–20)
CHLORIDE: 112 mmol/L — AB (ref 101–111)
CO2: 18 mmol/L — AB (ref 22–32)
CREATININE: 1.1 mg/dL (ref 0.61–1.24)
Calcium: 8.9 mg/dL (ref 8.9–10.3)
GFR calc non Af Amer: >60 mL/min (ref >60)
GLUCOSE: 116 mg/dL — AB (ref 65–99)
Potassium: 5.4 mmol/L — ABNORMAL HIGH (ref 3.5–5.1)
SODIUM: 136 mmol/L (ref 135–145)
Total Bilirubin: 0.5 mg/dL (ref 0.3–1.2)
Total Protein: 7 g/dL (ref 6.5–8.1)

## 2017-07-23 LAB — CBC WITH DIFFERENTIAL/PLATELET
Basophils Absolute: 0 10*3/uL (ref 0–0.1)
Basophils Relative: 0 %
EOS ABS: 0 10*3/uL (ref 0–0.7)
Eosinophils Relative: 1 %
HCT: 24.9 % — ABNORMAL LOW (ref 40.0–52.0)
HEMOGLOBIN: 8.6 g/dL — AB (ref 13.0–18.0)
LYMPHS ABS: 0.7 10*3/uL — AB (ref 1.0–3.6)
Lymphocytes Relative: 24 %
MCH: 35.1 pg — AB (ref 26.0–34.0)
MCHC: 34.5 g/dL (ref 32.0–36.0)
MCV: 101.5 fL — ABNORMAL HIGH (ref 80.0–100.0)
MONOS PCT: 11 %
Monocytes Absolute: 0.3 10*3/uL (ref 0.2–1.0)
NEUTROS PCT: 64 %
Neutro Abs: 1.9 10*3/uL (ref 1.4–6.5)
Platelets: 53 10*3/uL — ABNORMAL LOW (ref 150–440)
RBC: 2.46 MIL/uL — ABNORMAL LOW (ref 4.40–5.90)
RDW: 19.3 % — ABNORMAL HIGH (ref 11.5–14.5)
WBC: 3 10*3/uL — ABNORMAL LOW (ref 3.8–10.6)

## 2017-07-23 NOTE — Progress Notes (Signed)
Symptom Management Consult note [facility]  Telephone:([phone] [phone]  Patient Care Team: Leone Haven, MD as PCP - General (Family Medicine) Kathyrn Drown, MD (Family Medicine) Clent Jacks, RN as Registered Nurse   Name of the patient: Thomas David  [id number]  1936/02/01   Date of visit: 07/23/17  Diagnosis- adenocarcinoma of the gallbladder  Chief complaint/ Reason for visit- lab check  Heme/Onc history: Patient was last seen by [name] on 07/21/2017 for consideration of cycle 4-day 15 gemcitabine only.  He instead received 1 unit packed red blood cells.  He complained of increasing shortness of breath, fatigue and weakness.  Oncology History   Patient with initial diagnosis in January 2019 where he was admitted to the hospital for intermittent abdominal pain for several months.  This was thought to be related to his gallbladder he had surgery in October which was unsuccessful secondary to significant inflammation.  A drain was placed.  Had a cholecystectomy in December 2018 which revealed adenocarcinoma.  Had positive margins with disease on serosal surface.  Pet scans from March 19, 2017 confirming stage IV disease with omental and liver metastasis.  Plan is to continue palliative chemotherapy using gemcitabine and cisplatin Day 1 and gemcitabine only on days 8 and 15.  Cisplatin has been dose reduced due to difficulties during cycle 1.  He is scheduled for restaging PET/CT on July 06, 2017.  Pet Scan from 07/06/17 revealed improvement of hepatic metastasis decrease in metabolic activity of right adrenal gland metastasis and resolution of metabolic activity and left adrenal gland metastasis mild decrease in metabolic activity of omental metastasis disease.  Several new small pulmonary nodules in left lower lobe hypermetabolic.     Adenocarcinoma of gallbladder ([place])   02/19/2017 Initial Diagnosis    Adenocarcinoma of  gallbladder [facility])        Interval history-  Patient presents today for lab check.  He was recently here on 07/21/2017 for consideration of cycle 4-day 15 gemcitabine only and given his symptomatic anemia was given 1 unit packed red blood cells instead.  States on Wednesday he was considerably more short of breath than today.  Feels that this is improving.  Oxygen saturations today are 98% on room air.  is appetite has improved.  Has had several bowel movements.  Interested to see what his blood counts show today.    ECOG FS:1 - Symptomatic but completely ambulatory  Review of systems- Review of Systems  Constitutional: Positive for malaise/fatigue. Negative for chills, fever and weight loss.  HENT: Negative for congestion and ear pain.   Eyes: Negative.  Negative for blurred vision and double vision.  Respiratory: Positive for shortness of breath (Has improved since blood transfusion). Negative for cough and sputum production.   Cardiovascular: Negative.  Negative for chest pain, palpitations and leg swelling.  Gastrointestinal: Negative.  Negative for abdominal pain, constipation, diarrhea, nausea and vomiting.  Genitourinary: Negative for dysuria, frequency and urgency.  Musculoskeletal: Negative for back pain and falls.  Skin: Negative.  Negative for rash.  Neurological: Positive for weakness. Negative for headaches.  Endo/Heme/Allergies: Negative.  Does not bruise/bleed easily.  Psychiatric/Behavioral: Negative.  Negative for depression. The patient is not nervous/anxious and does not have insomnia.      Current treatment- S/p day 8 cycle 4 of gemcitabine only.  Skipped day 15 cycle 4 of gemcitabine due to symptomatic anemia.  No Known Allergies   Past Medical History:  Diagnosis Date  .  Anxiety   . Arthritis   . Chronic lower back pain   . Crohn's disease ([place])   . Depression   . Difficult intubation    patient denies on 02/12/17  . GERD (gastroesophageal reflux disease)     . Gout   . History of kidney stones 40 years ago  . Hypercholesteremia   . Hypertension   . Prediabetes   . Sleep apnea    cpap      Past Surgical History:  Procedure Laterality Date  . CHOLECYSTECTOMY N/A 11/03/2016   Procedure: LAPAROSCOPIC CHOLECYSTostomy with tube placement ;  Surgeon: Johnathan Hausen, MD;  Location: [facility] [facility];  Service: General;  Laterality: N/A;  . CHOLECYSTECTOMY N/A 02/15/2017   Procedure: LAPAROSCOPIC SUBTOTAL CHOLECYSTECTOMY, BIOPSY OF GALLBLADDER;  Surgeon: Johnathan Hausen, MD;  Location: [facility];  Service: General;  Laterality: N/A;  . COLECTOMY  1973   "part of large intestines" (05/04/2012)  . COLON RESECTION    . COLONOSCOPY    . COLONOSCOPY N/A 05/23/2014   Procedure: COLONOSCOPY;  Surgeon: Rogene Houston, MD;  [facility];  Service: Endoscopy;  Laterality: N/A;  1200  . ESOPHAGEAL DILATION N/A 05/23/2014   Procedure: ESOPHAGEAL DILATION;  Surgeon: Rogene Houston, MD;  [facility];  Service: Endoscopy;  Laterality: N/A;  . ESOPHAGOGASTRODUODENOSCOPY N/A 03/16/2014   Procedure: esophageal dilation ;  Surgeon: Rogene Houston, MD;  [facility];  Service: Endoscopy;  Laterality: N/A;  . ESOPHAGOGASTRODUODENOSCOPY N/A 05/23/2014   Procedure: ESOPHAGOGASTRODUODENOSCOPY (EGD);  Surgeon: Rogene Houston, MD;  [facility];  Service: Endoscopy;  Laterality: N/A;  . ESOPHAGOGASTRODUODENOSCOPY (EGD) WITH PROPOFOL N/A 02/18/2017   Procedure: ESOPHAGOGASTRODUODENOSCOPY (EGD) WITH PROPOFOL;  Surgeon: Wonda Horner, MD;  [facility];  Service: Endoscopy;  Laterality: N/A;  . [place]  . IR CHOLANGIOGRAM EXISTING TUBE  11/26/2016  . KIDNEY STONE SURGERY  1970's  . PARTIAL COLECTOMY  ~ 40 years ago   some of large and small  . PORTA CATH INSERTION N/A 03/17/2017   Procedure: PORTA CATH INSERTION;  Surgeon: Katha Cabal, MD;  Location: [place] [facility];  Service: Cardiovascular;   Laterality: N/A;  . PROSTATE SURGERY    . THYROIDECTOMY Right 05/04/2012   Procedure: RIGHT HEMI THYROIDECTOMY;  Surgeon: Ascencion Dike, MD;  Location: [place];  Service: ENT;  Laterality: Right;  . THYROIDECTOMY    . TONSILLECTOMY AND ADENOIDECTOMY     as a child  . TRANSURETHRAL PROSTATECTOMY WITH GYRUS INSTRUMENTS N/A 07/11/2012   Procedure: TRANSURETHRAL PROSTATECTOMY WITH GYRUS INSTRUMENTS;  Surgeon: Claybon Jabs, MD;  Location: [facility];  Service: Urology;  Laterality: N/A;  . UPPER GASTROINTESTINAL ENDOSCOPY      Social History   Socioeconomic History  . Marital status: Widowed    Spouse name: Not on file  . Number of children: Not on file  . Years of education: Not on file  . Highest education level: Not on file  Occupational History  . Not on file  Social Needs  . Financial resource strain: Not on file  . Food insecurity:    Worry: Not on file    Inability: Not on file  . Transportation needs:    Medical: Not on file    Non-medical: Not on file  Tobacco Use  . Smoking status: Never Smoker  . Smokeless tobacco: Never Used  Substance and Sexual Activity  . Alcohol use: No    Alcohol/week:  0.0 oz  . Drug use: No  . Sexual activity: Never  Lifestyle  . Physical activity:    Days per week: Not on file    Minutes per session: Not on file  . Stress: Not on file  Relationships  . Social connections:    Talks on phone: Not on file    Gets together: Not on file    Attends religious service: Not on file    Active member of club or organization: Not on file    Attends meetings of clubs or organizations: Not on file    Relationship status: Not on file  . Intimate partner violence:    Fear of current or ex partner: Not on file    Emotionally abused: Not on file    Physically abused: Not on file    Forced sexual activity: Not on file  Other Topics Concern  . Not on file  Social History Narrative   ** Merged History Encounter **        Family History  Problem Relation  Age of Onset  . Diabetes Mother   . Stroke Father   . Healthy Daughter   . Healthy Son   . Heart disease Sister      Current Outpatient Medications:  .  acetaminophen (TYLENOL) 500 MG tablet, Take 1,000 mg by mouth every 4 (four) hours as needed for moderate pain or headache. , Disp: , Rfl:  .  allopurinol (ZYLOPRIM) 300 MG tablet, Take 1 tablet (300 mg total) by mouth every morning., Disp: 90 tablet, Rfl: 3 .  amLODipine (NORVASC) 5 MG tablet, TAKE 1 TABLET BY MOUTH  DAILY, Disp: 90 tablet, Rfl: 3 .  aspirin EC 81 MG tablet, Take 81 mg by mouth at bedtime., Disp: , Rfl:  .  bisacodyl (DULCOLAX) 10 MG suppository, Place 1 suppository (10 mg total) rectally daily as needed for moderate constipation., Disp: 12 suppository, Rfl: 0 .  fentaNYL (DURAGESIC - DOSED MCG/HR) 25 MCG/HR patch, Place 1 patch (25 mcg total) onto the skin every 3 (three) days., Disp: 10 patch, Rfl: 0 .  fluticasone (FLONASE) 50 MCG/ACT nasal spray, USE 1 SPRAY INTO BOTH  NOSTRILS DAILY, Disp: 32 g, Rfl: 0 .  Histamine Dihydrochloride (AUSTRALIAN DREAM ARTHRITIS EX), Apply 1 application topically 2 (two) times daily as needed (back pain). , Disp: , Rfl:  .  HYDROcodone-acetaminophen (NORCO/VICODIN) 5-325 MG tablet, Take 1-2 tablets by mouth every 4 (four) hours as needed for moderate pain., Disp: 30 tablet, Rfl: 0 .  lidocaine-prilocaine (EMLA) cream, Apply to port 1 hours prior to chemotherapy appointment. Cover with plastic wrap., Disp: 30 g, Rfl: 3 .  losartan (COZAAR) 100 MG tablet, Take 1 tablet (100 mg total) by mouth daily., Disp: 90 tablet, Rfl: 3 .  megestrol (MEGACE) 40 MG tablet, Take 1 tablet (40 mg total) by mouth daily., Disp: 30 tablet, Rfl: 2 .  mesalamine (PENTASA) 500 MG CR capsule, Take 1 capsule (500 mg total) by mouth 4 (four) times daily. (Patient taking differently: Take 1,000 mg by mouth 2 (two) times daily. ), Disp: 360 capsule, Rfl: 3 .  metoprolol tartrate (LOPRESSOR) 50 MG tablet, Take 1 tablet  (50 mg total) by mouth 2 (two) times daily., Disp: 180 tablet, Rfl: 3 .  Multiple Vitamin (MULTIVITAMIN WITH MINERALS) TABS tablet, Take 2 tablets by mouth daily., Disp: , Rfl:  .  ondansetron (ZOFRAN) 8 MG tablet, Take 1 tablet (8 mg total) by mouth 2 (two) times daily., Disp: 30 tablet,  Rfl: 3 .  oxybutynin (DITROPAN-XL) 5 MG 24 hr tablet, Take 1 tablet (5 mg total) by mouth at bedtime., Disp: 90 tablet, Rfl: 1 .  pantoprazole (PROTONIX) 40 MG tablet, Take 1 tablet (40 mg total) by mouth daily., Disp: 30 tablet, Rfl: 1 .  simvastatin (ZOCOR) 20 MG tablet, Take 1 tablet (20 mg total) by mouth daily., Disp: 90 tablet, Rfl: 3 .  vitamin B-12 (CYANOCOBALAMIN) 1000 MCG tablet, Take 1,000 mcg by mouth 2 (two) times daily., Disp: , Rfl:  .  lactulose (CHRONULAC) 10 GM/15ML solution, Take 15 mLs (10 g total) by mouth daily. (Patient not taking: Reported on 07/23/2017), Disp: 240 mL, Rfl: 1 No current facility-administered medications for this visit.   Facility-Administered Medications Ordered in Other Visits:  .  heparin lock flush 100 unit/mL, 500 Units, Intravenous, Once, Shamica Moree E, NP .  sodium chloride flush (NS) 0.9 % injection 10 mL, 10 mL, Intravenous, PRN, Jacquelin Hawking, NP, 10 mL at 07/12/17 1445  Physical exam:  Vitals:   07/23/17 0959 07/23/17 1000  BP:  119/73  Pulse:  76  Resp:  20  Temp: 98.1 F (36.7 C)   TempSrc: Tympanic   SpO2: 99%   Weight: 180 lb (81.6 kg)    Physical Exam  Constitutional: He is oriented to person, place, and time. Vital signs are normal. He appears well-developed and well-nourished.  HENT:  Head: Normocephalic and atraumatic.  Eyes: Pupils are equal, round, and reactive to light.  Neck: Normal range of motion.  Cardiovascular: Normal rate, regular rhythm and normal heart sounds.  No murmur heard. Pulmonary/Chest: Effort normal and breath sounds normal. He has no wheezes.  Abdominal: Soft. Normal appearance and bowel sounds are normal. He  exhibits no distension. There is no tenderness.  Musculoskeletal: Normal range of motion. He exhibits no edema.  Neurological: He is alert and oriented to person, place, and time.  Skin: Skin is warm and dry. No rash noted.  Psychiatric: Judgment normal.  Nursing note and vitals reviewed.    CMP Latest Ref Rng & Units 07/23/2017  Glucose 65 - 99 mg/dL 116(H)  BUN 6 - 20 mg/dL 32(H)  Creatinine 0.61 - 1.24 mg/dL 1.10  Sodium 135 - 145 mmol/L 136  Potassium 3.5 - 5.1 mmol/L 5.4(H)  Chloride 101 - 111 mmol/L 112(H)  CO2 22 - 32 mmol/L 18(L)  Calcium 8.9 - 10.3 mg/dL 8.9  Total Protein 6.5 - 8.1 g/dL 7.0  Total Bilirubin 0.3 - 1.2 mg/dL 0.5  Alkaline Phos 38 - 126 U/L 282(H)  AST 15 - 41 U/L 35  ALT 17 - 63 U/L 35   CBC Latest Ref Rng & Units 07/23/2017  WBC 3.8 - 10.6 K/uL 3.0(L)  Hemoglobin 13.0 - 18.0 g/dL 8.6(L)  Hematocrit 40.0 - 52.0 % 24.9(L)  Platelets 150 - 440 K/uL 53(L)    No images are attached to the encounter.  Ct Angio Chest Pe W Or Wo Contrast  Result Date: 07/12/2017 CLINICAL DATA:  Shortness of breath for 2 weeks, history of carcinoma the gallbladder EXAM: CT ANGIOGRAPHY CHEST WITH CONTRAST TECHNIQUE: Multidetector CT imaging of the chest was performed using the standard protocol during bolus administration of intravenous contrast. Multiplanar CT image reconstructions and MIPs were obtained to evaluate the vascular anatomy. CONTRAST:  48m ISOVUE-370 IOPAMIDOL (ISOVUE-370) INJECTION 76% COMPARISON:  PET-CT of 07/06/2017 FINDINGS: Cardiovascular: The pulmonary arteries are well opacified. There is no evidence of acute pulmonary embolism. The thoracic aorta also opacifies well with  no acute abnormality. Moderate thoracic aortic atherosclerosis is noted and diffuse coronary artery calcifications are present. Mediastinum/Nodes: There is a precarinal lymph node of 11 mm in short axis diameter. No other prominent lymph node is seen in this most likely is post inflammatory or  post infectious in origin. There is a small low-attenuation nodule within the left thyroid gland. The right thyroid gland is not seen and may have been resected previously. No hiatal hernia is noted. Lungs/Pleura: On lung window images, there is opacity abutting the minor fissure of the inferior anterior right upper lobe. This may represent scarring, atelectasis, or less likely pneumonia. Few bronchi due course through this region which appears slightly prominent and this may represent scarring from prior infection. No definite pneumonia or pleural effusion is currently seen. No suspicious lung nodule is noted. The airway is patent. Upper Abdomen: On limited views of the upper abdomen on this study, the liver is minimally prominent. No focal abnormality is seen. The gallbladder is somewhat distended. A large cyst emanates from the upper pole of the right kidney with small cyst emanating from the upper pole of the left kidney. Higher attenuation lesion emanates from the lateral posterior upper right kidney with attenuation of 33 HU. However, this area was not hypermetabolic on recent PET scan and this may simply represent a complex right renal cyst. Musculoskeletal: The thoracic vertebrae are in normal alignment with mild degenerative change. No compression deformity is seen. Review of the MIP images confirms the above findings. IMPRESSION: 1. No evidence of acute pulmonary embolism. 2. Moderately severe thoracic aortic atherosclerosis and diffuse coronary artery calcifications. 3. Probable scarring or atelectasis abutting the minor fissure in the anterior inferior right upper lobe. 4. Bilateral renal cysts not well imaged on this CT the chest as noted above, at least 1 of which on the right appears complex. Electronically Signed   By: Ivar Drape M.D.   On: 07/12/2017 16:56   Nm Pet Image Restag (ps) Skull Base To Thigh  Result Date: 07/06/2017 CLINICAL DATA:  Subsequent treatment strategy for gallbladder  carcinoma. EXAM: NUCLEAR MEDICINE PET SKULL BASE TO THIGH TECHNIQUE: 9.3 mCi F-18 FDG was injected intravenously. Full-ring PET imaging was performed from the skull base to thigh after the radiotracer. CT data was obtained and used for attenuation correction and anatomic localization. Fasting blood glucose: 87 mg/dl COMPARISON:  PET-CT 03/19/2017, CT 09/17/2016 FINDINGS: Mediastinal blood pool activity: SUV max 2.2 NECK: No hypermetabolic lymph nodes in the neck. Incidental CT findings: none CHEST: Decrease in nodularity in the RIGHT upper lobe. 6 mm nodule decreased from 7 mm (image 79/3). Band of atelectatic thickening in the RIGHT middle lobe is also unchanged. No hypermetabolic pulmonary nodules. Small nodule in the LEFT lower lobe measures 4 mm (image 114/3) which is not clearly seen on prior. There are two additional nodules adjacent which are new (image 111/3. Incidental CT findings: Coronary artery calcification and aortic atherosclerotic calcification. ABDOMEN/PELVIS: Interval decrease in the metabolic activity of hepatic metastasis in the LEFT hepatic lobe. No discrete metabolic activity above background liver activity. There is interval decrease in metabolic activity enlarged RIGHT adrenal gland with SUV max equal 5.8 decreased from 8.1. Near complete resolution of metabolic activity in the LEFT adrenal gland. The hypermetabolic omental thickening in the RIGHT upper quadrant is mildly decreased SUV max equal 4.0 decreased from 6.4. visually the thickening is similar (image 182/3) Incidental CT findings: Large prostate gland. Atherosclerotic calcification of the aorta. Extensive renal cysts without metabolic activity  SKELETON: No focal hypermetabolic activity to suggest skeletal metastasis. Incidental CT findings: none IMPRESSION: 1. Improvement hepatic metastasis with no discrete lesion above liver background metabolic activity. 2. Decrease in metabolic activity of RIGHT adrenal gland metastasis and  resolution of metabolic activity in LEFT adrenal gland metastasis. 3. Mild decrease in metabolic activity of omental metastatic disease in the RIGHT upper quadrant with similar omental thickening. 4. Several new small pulmonary nodules in the LEFT lower lobe medially are indeterminate. Recommend attention on follow-up Electronically Signed   By: Suzy Bouchard M.D.   On: 07/06/2017 11:36     Assessment and plan- Patient is a 82 y.o. male who presents for labs check.   1.  Gallbladder cancer: S/p cycle 4-day 8 on 07/14/2017 (Gemcitabine only).  Cycle 4-day 15 of gemcitabine was held due to symptomatic anemia.  He received 1 unit packed red blood cells instead.  After cycle 1 he was dose reduced due to declining performance status and intermittent confusion.  Restaging PET on 07/06/2017 revealed decrease of hepatic metastasis, decrease in large right adrenal gland and near complete resolution of left adrenal gland.  New lung nodules that were not were noted hypermetabolic.  He is scheduled to return back to clinic on 08/11/2017 for consideration of cycle 5 gemcitabine and cisplatin.  2. Hyperkalemia: Potassium 5.4.  Patient has chronic issues with constipation as well as dehydration.  Encouraged patient to push fluids and to avoid potassium rich foods.  A complete list of potassium rich foods was provided for his knowledge.  We discussed hyperkalemia and fax of having a high potassium level.  Patient is to call if he develops any symptoms.  We will have him return in 1 week for a repeat lab check.  May need 1 dose of Kayexalate if this does not improve.  3. Constipation: Has prescription for lactulose  If needed.  Continue MiraLAX and Senokot as per his bowel regimen.  Had 2 bowel movements this morning.   4.  Anemia: Improved since 1 unit packed red blood cells. Today 8.6.  Feels less short of breath.  Continue to monitor.  We will bring him back next week for lab check.  5.  Malnutrition: Meets regularly  with dietitian Jolie.  Provided a case of ensures today.    Visit Diagnosis 1. Hyperkalemia   2. Other constipation   3. Adenocarcinoma of gallbladder [facility])     Patient expressed understanding and was in agreement with this plan. He also understands that He can call clinic at any time with any questions, concerns, or complaints.   Greater than 50% was spent in counseling and coordination of care with this patient including but not limited to discussion of the relevant topics above (See A&P) including, but not limited to diagnosis and management of acute and chronic medical conditions.    Faythe Casa, AGNP-C [facility]- [phone] Pager- [phone] 07/23/2017 10:56 AM

## 2017-07-23 NOTE — Patient Instructions (Signed)
Potassium Content of Foods Potassium is a mineral found in many foods and drinks. It helps keep fluids and minerals balanced in your body and affects how steadily your heart beats. Potassium also helps control your blood pressure and keep your muscles and nervous system healthy. Certain health conditions and medicines may change the balance of potassium in your body. When this happens, you can help balance your level of potassium through the foods that you do or do not eat. Your health care provider or dietitian may recommend an amount of potassium that you should have each day. The following lists of foods provide the amount of potassium (in parentheses) per serving in each item. High in potassium The following foods and beverages have 200 mg or more of potassium per serving:  Apricots, 2 raw or 5 dry (200 mg).  Artichoke, 1 medium (345 mg).  Avocado, raw,  each (245 mg).  Banana, 1 medium (425 mg).  Beans, lima, or baked beans, canned,  cup (280 mg).  Beans, white, canned,  cup (595 mg).  Beef roast, 3 oz (320 mg).  Beef, ground, 3 oz (270 mg).  Beets, raw or cooked,  cup (260 mg).  Bran muffin, 2 oz (300 mg).  Broccoli,  cup (230 mg).  Brussels sprouts,  cup (250 mg).  Cantaloupe,  cup (215 mg).  Cereal, 100% bran,  cup (200-400 mg).  Cheeseburger, single, fast food, 1 each (225-400 mg).  Chicken, 3 oz (220 mg).  Clams, canned, 3 oz (535 mg).  Crab, 3 oz (225 mg).  Dates, 5 each (270 mg).  Dried beans and peas,  cup (300-475 mg).  Figs, dried, 2 each (260 mg).  Fish: halibut, tuna, cod, snapper, 3 oz (480 mg).  Fish: salmon, haddock, swordfish, perch, 3 oz (300 mg).  Fish, tuna, canned 3 oz (200 mg).  [place] fries, fast food, 3 oz (470 mg).  Granola with fruit and nuts,  cup (200 mg).  Grapefruit juice,  cup (200 mg).  Greens, beet,  cup (655 mg).  Honeydew melon,  cup (200 mg).  Kale, raw, 1 cup (300 mg).  Kiwi, 1 medium (240  mg).  Kohlrabi, rutabaga, parsnips,  cup (280 mg).  Lentils,  cup (365 mg).  Mango, 1 each (325 mg).  Milk, chocolate, 1 cup (420 mg).  Milk: nonfat, low-fat, whole, buttermilk, 1 cup (350-380 mg).  Molasses, 1 Tbsp (295 mg).  Mushrooms,  cup (280) mg.  Nectarine, 1 each (275 mg).  Nuts: almonds, peanuts, hazelnuts, [place], cashew, mixed, 1 oz (200 mg).  Nuts, pistachios, 1 oz (295 mg).  Orange, 1 each (240 mg).  Orange juice,  cup (235 mg).  Papaya, medium,  fruit (390 mg).  Peanut butter, chunky, 2 Tbsp (240 mg).  Peanut butter, smooth, 2 Tbsp (210 mg).  Pear, 1 medium (200 mg).  Pomegranate, 1 whole (400 mg).  Pomegranate juice,  cup (215 mg).  Pork, 3 oz (350 mg).  Potato chips, salted, 1 oz (465 mg).  Potato, baked with skin, 1 medium (925 mg).  Potatoes, boiled,  cup (255 mg).  Potatoes, mashed,  cup (330 mg).  Prune juice,  cup (370 mg).  Prunes, 5 each (305 mg).  Pudding, chocolate,  cup (230 mg).  Pumpkin, canned,  cup (250 mg).  Raisins, seedless,  cup (270 mg).  Seeds, sunflower or pumpkin, 1 oz (240 mg).  Soy milk, 1 cup (300 mg).  Spinach,  cup (420 mg).  Spinach, canned,  cup (370 mg).  Sweet  potato, baked with skin, 1 medium (450 mg).  Swiss chard,  cup (480 mg).  Tomato or vegetable juice,  cup (275 mg).  Tomato sauce or puree,  cup (400-550 mg).  Tomato, raw, 1 medium (290 mg).  Tomatoes, canned,  cup (200-300 mg).  [place], 3 oz (250 mg).  Wheat germ, 1 oz (250 mg).  Winter squash,  cup (250 mg).  Yogurt, plain or fruited, 6 oz (260-435 mg).  Zucchini,  cup (220 mg).  Moderate in potassium The following foods and beverages have 50-200 mg of potassium per serving:  Apple, 1 each (150 mg).  Apple juice,  cup (150 mg).  Applesauce,  cup (90 mg).  Apricot nectar,  cup (140 mg).  Asparagus, small spears,  cup or 6 spears (155 mg).  Bagel, cinnamon raisin, 1 each (130 mg).  Bagel,  egg or plain, 4 in., 1 each (70 mg).  Beans, green,  cup (90 mg).  Beans, yellow,  cup (190 mg).  Beer, regular, 12 oz (100 mg).  Beets, canned,  cup (125 mg).  Blackberries,  cup (115 mg).  Blueberries,  cup (60 mg).  Bread, whole wheat, 1 slice (70 mg).  Broccoli, raw,  cup (145 mg).  Cabbage,  cup (150 mg).  Carrots, cooked or raw,  cup (180 mg).  Cauliflower, raw,  cup (150 mg).  Celery, raw,  cup (155 mg).  Cereal, bran flakes, cup (120-150 mg).  Cheese, cottage,  cup (110 mg).  Cherries, 10 each (150 mg).  Chocolate, 1 oz bar (165 mg).  Coffee, brewed 6 oz (90 mg).  Corn,  cup or 1 ear (195 mg).  Cucumbers,  cup (80 mg).  Egg, large, 1 each (60 mg).  Eggplant,  cup (60 mg).  Endive, raw, cup (80 mg).  English muffin, 1 each (65 mg).  Fish, orange roughy, 3 oz (150 mg).  Frankfurter, beef or pork, 1 each (75 mg).  Fruit cocktail,  cup (115 mg).  Grape juice,  cup (170 mg).  Grapefruit,  fruit (175 mg).  Grapes,  cup (155 mg).  Greens: kale, turnip, collard,  cup (110-150 mg).  Ice cream or frozen yogurt, chocolate,  cup (175 mg).  Ice cream or frozen yogurt, vanilla,  cup (120-150 mg).  Lemons, limes, 1 each (80 mg).  Lettuce, all types, 1 cup (100 mg).  Mixed vegetables,  cup (150 mg).  Mushrooms, raw,  cup (110 mg).  Nuts: walnuts, pecans, or macadamia, 1 oz (125 mg).  Oatmeal,  cup (80 mg).  Okra,  cup (110 mg).  Onions, raw,  cup (120 mg).  Peach, 1 each (185 mg).  Peaches, canned,  cup (120 mg).  Pears, canned,  cup (120 mg).  Peas, green, frozen,  cup (90 mg).  Peppers, green,  cup (130 mg).  Peppers, red,  cup (160 mg).  Pineapple juice,  cup (165 mg).  Pineapple, fresh or canned,  cup (100 mg).  Plums, 1 each (105 mg).  Pudding, vanilla,  cup (150 mg).  Raspberries,  cup (90 mg).  Rhubarb,  cup (115 mg).  Rice, wild,  cup (80 mg).  Shrimp, 3 oz (155  mg).  Spinach, raw, 1 cup (170 mg).  Strawberries,  cup (125 mg).  Summer squash  cup (175-200 mg).  Swiss chard, raw, 1 cup (135 mg).  Tangerines, 1 each (140 mg).  Tea, brewed, 6 oz (65 mg).  Turnips,  cup (140 mg).  Watermelon,  cup (85 mg).  Wine, red, table,  5 oz (180 mg).  Wine, white, table, 5 oz (100 mg).  Low in potassium The following foods and beverages have less than 50 mg of potassium per serving.  Bread, white, 1 slice (30 mg).  Carbonated beverages, 12 oz (less than 5 mg).  Cheese, 1 oz (20-30 mg).  Cranberries,  cup (45 mg).  Cranberry juice cocktail,  cup (20 mg).  Fats and oils, 1 Tbsp (less than 5 mg).  Hummus, 1 Tbsp (32 mg).  Nectar: papaya, mango, or pear,  cup (35 mg).  Rice, white or brown,  cup (50 mg).  Spaghetti or macaroni,  cup cooked (30 mg).  Tortilla, flour or corn, 1 each (50 mg).  Waffle, 4 in., 1 each (50 mg).  Water chestnuts,  cup (40 mg).  This information is not intended to replace advice given to you by your health care provider. Make sure you discuss any questions you have with your health care provider. Document Released: 09/02/2004 Document Revised: 06/27/2015 Document Reviewed: 12/16/2012 Elsevier Interactive Patient Education  2018 [employer]. Hyperkalemia Hyperkalemia is when you have too much potassium in your blood. Potassium is normally removed (excreted) from your body by your kidneys. If there is too much potassium in your blood, it can affect your heart's ability to function. What are the causes? Hyperkalemia may be caused by:  Taking in too much potassium. You can do this by: ? Using salt substitutes. They contain large amounts of potassium. ? Taking potassium supplements. ? Eating foods high in potassium.  Excreting too little potassium. This can happen if: ? Your kidneys are not working properly. Kidney (renal) disease, including short- or long-term renal failure, is a very common  cause of hyperkalemia. ? You are taking medicines that lower your excretion of potassium. ? You have Addison disease. ? You have a urinary tract blockage, such as kidney stones. ? You are on treatment to mechanically clean your blood (dialysis) and you skip a treatment.  Releasing a high amount of potassium from your cells into your blood. This can happen with: ? Injury to muscles (rhabdomyolysis) or other tissues. Most potassium is stored in your muscles. ? Severe burns or infections. ? Acidic blood plasma (acidosis). Acidosis can result from many diseases, such as uncontrolled diabetes.  What increases the risk? The most common risk factor of hyperkalemia is kidney disease. Other risk factors of hyperkalemia include:  Addison disease. This is a condition where your glands do not produce enough hormones.  Alcoholism or heavy drug use.  Using certain blood pressure medicines, such as angiotensin-converting enzyme (ACE) inhibitors, angiotensin II receptor blockers (ARBs), or potassium-sparing diuretics such as spironolactone.  Severe injury or burn.  What are the signs or symptoms? Oftentimes, there are no signs or symptoms of hyperkalemia. However, when your potassium level becomes high enough, you may experience symptoms such as:  Irregular or very slow heartbeat.  Nausea.  Fatigue.  Tingling of the skin or numbness of the hands or feet.  Muscle weakness.  Fatigue.  Not being able to move (paralysis).  You may not have any symptoms of hyperkalemia. How is this diagnosed? Hyperkalemia may be diagnosed by:  Physical exam.  Blood tests.  ECG (electrocardiogram).  Discussion of prescription and non-prescription drug use.  How is this treated? Treatment for hyperkalemia is often directed at the underlying cause. In some instances, treatment may include:  Insulin.  Glucose (sugar) and water solution given through a vein (intravenous or  IV).  Dialysis.  Medicines to  remove the potassium from your body.  Medicines to move calcium from your bloodstream into your tissues.  Follow these instructions at home:  Take medicines only as directed by your health care provider.  Do not take any supplements, natural products, herbs, or vitamins without reviewing them with your health care provider. Certain supplements and natural food products can have high amounts of potassium.  Limit your alcohol intake as directed by your health care provider.  Stop illegal drug use. If you need help quitting, ask your health care provider.  Keep all follow-up visits as directed by your health care provider. This is important.  If you have kidney disease, you may need to follow a low potassium diet. A dietitian can help educate you on low potassium foods. Contact a health care provider if:  You notice an irregular or very slow heartbeat.  You feel light-headed.  You feel weak.  You are nauseous.  You have tingling or numbness in your hands or feet. Get help right away if:  You have shortness of breath.  You have chest pain or discomfort.  You pass out.  You have muscle paralysis. This information is not intended to replace advice given to you by your health care provider. Make sure you discuss any questions you have with your health care provider. Document Released: 01/09/2002 Document Revised: 06/27/2015 Document Reviewed: 04/26/2013 Elsevier Interactive Patient Education  2018 [employer].

## 2017-07-24 LAB — SAMPLE TO BLOOD BANK

## 2017-07-30 ENCOUNTER — Other Ambulatory Visit: Payer: Self-pay

## 2017-07-30 ENCOUNTER — Inpatient Hospital Stay: Payer: Medicare Other

## 2017-07-30 DIAGNOSIS — I951 Orthostatic hypotension: Secondary | ICD-10-CM | POA: Diagnosis not present

## 2017-07-30 DIAGNOSIS — Z79899 Other long term (current) drug therapy: Secondary | ICD-10-CM | POA: Diagnosis not present

## 2017-07-30 DIAGNOSIS — D6481 Anemia due to antineoplastic chemotherapy: Secondary | ICD-10-CM | POA: Diagnosis not present

## 2017-07-30 DIAGNOSIS — C786 Secondary malignant neoplasm of retroperitoneum and peritoneum: Secondary | ICD-10-CM | POA: Diagnosis not present

## 2017-07-30 DIAGNOSIS — G8929 Other chronic pain: Secondary | ICD-10-CM | POA: Diagnosis not present

## 2017-07-30 DIAGNOSIS — R948 Abnormal results of function studies of other organs and systems: Secondary | ICD-10-CM | POA: Diagnosis not present

## 2017-07-30 DIAGNOSIS — K219 Gastro-esophageal reflux disease without esophagitis: Secondary | ICD-10-CM | POA: Diagnosis not present

## 2017-07-30 DIAGNOSIS — E875 Hyperkalemia: Secondary | ICD-10-CM | POA: Diagnosis not present

## 2017-07-30 DIAGNOSIS — M129 Arthropathy, unspecified: Secondary | ICD-10-CM | POA: Diagnosis not present

## 2017-07-30 DIAGNOSIS — Z87442 Personal history of urinary calculi: Secondary | ICD-10-CM | POA: Diagnosis not present

## 2017-07-30 DIAGNOSIS — C23 Malignant neoplasm of gallbladder: Secondary | ICD-10-CM | POA: Diagnosis not present

## 2017-07-30 DIAGNOSIS — R531 Weakness: Secondary | ICD-10-CM | POA: Diagnosis not present

## 2017-07-30 DIAGNOSIS — E7800 Pure hypercholesterolemia, unspecified: Secondary | ICD-10-CM | POA: Diagnosis not present

## 2017-07-30 DIAGNOSIS — R5383 Other fatigue: Secondary | ICD-10-CM | POA: Diagnosis not present

## 2017-07-30 DIAGNOSIS — Z7982 Long term (current) use of aspirin: Secondary | ICD-10-CM | POA: Diagnosis not present

## 2017-07-30 DIAGNOSIS — R0602 Shortness of breath: Secondary | ICD-10-CM | POA: Diagnosis not present

## 2017-07-30 DIAGNOSIS — K5090 Crohn's disease, unspecified, without complications: Secondary | ICD-10-CM | POA: Diagnosis not present

## 2017-07-30 DIAGNOSIS — E860 Dehydration: Secondary | ICD-10-CM | POA: Diagnosis not present

## 2017-07-30 DIAGNOSIS — G4730 Sleep apnea, unspecified: Secondary | ICD-10-CM | POA: Diagnosis not present

## 2017-07-30 DIAGNOSIS — Z5111 Encounter for antineoplastic chemotherapy: Secondary | ICD-10-CM | POA: Diagnosis not present

## 2017-07-30 DIAGNOSIS — D649 Anemia, unspecified: Secondary | ICD-10-CM

## 2017-07-30 DIAGNOSIS — R110 Nausea: Secondary | ICD-10-CM | POA: Diagnosis not present

## 2017-07-30 DIAGNOSIS — C787 Secondary malignant neoplasm of liver and intrahepatic bile duct: Secondary | ICD-10-CM | POA: Diagnosis not present

## 2017-07-30 DIAGNOSIS — K5900 Constipation, unspecified: Secondary | ICD-10-CM | POA: Diagnosis not present

## 2017-07-30 LAB — CBC WITH DIFFERENTIAL/PLATELET
Basophils Absolute: 0 10*3/uL (ref 0–0.1)
Basophils Relative: 1 %
EOS ABS: 0.1 10*3/uL (ref 0–0.7)
EOS PCT: 2 %
HCT: 27.4 % — ABNORMAL LOW (ref 40.0–52.0)
Hemoglobin: 9.4 g/dL — ABNORMAL LOW (ref 13.0–18.0)
LYMPHS PCT: 22 %
Lymphs Abs: 0.9 10*3/uL — ABNORMAL LOW (ref 1.0–3.6)
MCH: 36 pg — AB (ref 26.0–34.0)
MCHC: 34.5 g/dL (ref 32.0–36.0)
MCV: 104.4 fL — ABNORMAL HIGH (ref 80.0–100.0)
MONO ABS: 1.2 10*3/uL — AB (ref 0.2–1.0)
Monocytes Relative: 29 %
Neutro Abs: 1.9 10*3/uL (ref 1.4–6.5)
Neutrophils Relative %: 46 %
PLATELETS: 294 10*3/uL (ref 150–440)
RBC: 2.62 MIL/uL — ABNORMAL LOW (ref 4.40–5.90)
RDW: 21.9 % — AB (ref 11.5–14.5)
WBC: 4 10*3/uL (ref 3.8–10.6)

## 2017-07-30 LAB — COMPREHENSIVE METABOLIC PANEL
ALBUMIN: 3.6 g/dL (ref 3.5–5.0)
ALT: 26 U/L (ref 0–44)
AST: 31 U/L (ref 15–41)
Alkaline Phosphatase: 268 U/L — ABNORMAL HIGH (ref 38–126)
Anion gap: 9 (ref 5–15)
BUN: 24 mg/dL — AB (ref 8–23)
CO2: 18 mmol/L — ABNORMAL LOW (ref 22–32)
CREATININE: 1.18 mg/dL (ref 0.61–1.24)
Calcium: 8.6 mg/dL — ABNORMAL LOW (ref 8.9–10.3)
Chloride: 108 mmol/L (ref 98–111)
GFR calc Af Amer: >60 mL/min (ref >60)
GFR, EST NON AFRICAN AMERICAN: 57 mL/min — AB (ref >60)
GLUCOSE: 123 mg/dL — AB (ref 70–99)
POTASSIUM: 4.3 mmol/L (ref 3.5–5.1)
Sodium: 135 mmol/L (ref 135–145)
TOTAL PROTEIN: 6.9 g/dL (ref 6.5–8.1)
Total Bilirubin: 0.2 mg/dL — ABNORMAL LOW (ref 0.3–1.2)

## 2017-07-30 LAB — SAMPLE TO BLOOD BANK

## 2017-08-08 NOTE — Progress Notes (Signed)
[place]  [phone] [phone]  ID: Thomas David OB: [birth year]  MR#: [id number]  RCV#:[id number]  Patient Care Team: Leone Haven, MD as PCP - General (Family Medicine) Kathyrn Drown, MD (Family Medicine) Clent Jacks, RN as Registered Nurse  CHIEF COMPLAINT: Adenocarcinoma of gallbladder.  INTERVAL HISTORY: Patient returns to clinic today for further evaluation and consideration of cycle 5, day 1 of cisplatin and gemcitabine.  Cycle 4, day 15 was skipped secondary to poor performance status.  He currently feels well and nearly back to his baseline.  He continues to have chronic weakness and fatigue.  He continues to have shortness of breath and dyspnea on exertion. He has an improved appetite. He has no neurologic complaints.  He denies any recent fevers or illnesses. He denies any chest pain.  He does not complain of abdominal pain today.  He denies any nausea or vomiting.  He continues to have loose stools.  He also reports frequent urination.  Patient offers no further specific complaints today.   REVIEW OF SYSTEMS:   Review of Systems  Constitutional: Positive for malaise/fatigue. Negative for fever and weight loss.  Eyes: Negative for blurred vision.  Respiratory: Positive for shortness of breath. Negative for cough.   Cardiovascular: Negative.  Negative for chest pain and leg swelling.  Gastrointestinal: Negative for abdominal pain, blood in stool, constipation, heartburn, melena and nausea.  Genitourinary: Positive for frequency. Negative for dysuria.  Musculoskeletal: Negative.  Negative for falls.  Skin: Negative.  Negative for rash.  Neurological: Positive for weakness. Negative for sensory change and focal weakness.  Psychiatric/Behavioral: Negative.  The patient is not nervous/anxious.     As per HPI. Otherwise, a complete review of systems is negative.  PAST MEDICAL HISTORY: Past Medical History:  Diagnosis  Date  . Anxiety   . Arthritis   . Chronic lower back pain   . Crohn's disease ([place])   . Depression   . Difficult intubation    patient denies on 02/12/17  . GERD (gastroesophageal reflux disease)   . Gout   . History of kidney stones 40 years ago  . Hypercholesteremia   . Hypertension   . Prediabetes   . Sleep apnea    cpap     PAST SURGICAL HISTORY: Past Surgical History:  Procedure Laterality Date  . CHOLECYSTECTOMY N/A 11/03/2016   Procedure: LAPAROSCOPIC CHOLECYSTostomy with tube placement ;  Surgeon: Johnathan Hausen, MD;  Location: [facility];  Service: General;  Laterality: N/A;  . CHOLECYSTECTOMY N/A 02/15/2017   Procedure: LAPAROSCOPIC SUBTOTAL CHOLECYSTECTOMY, BIOPSY OF GALLBLADDER;  Surgeon: Johnathan Hausen, MD;  Location: [facility];  Service: General;  Laterality: N/A;  . COLECTOMY  1973   "part of large intestines" (05/04/2012)  . COLON RESECTION    . COLONOSCOPY    . COLONOSCOPY N/A 05/23/2014   Procedure: COLONOSCOPY;  Surgeon: Rogene Houston, MD;  Location: [facility];  Service: Endoscopy;  Laterality: N/A;  1200  . ESOPHAGEAL DILATION N/A 05/23/2014   Procedure: ESOPHAGEAL DILATION;  Surgeon: Rogene Houston, MD;  Location: [facility];  Service: Endoscopy;  Laterality: N/A;  . ESOPHAGOGASTRODUODENOSCOPY N/A 03/16/2014   Procedure: esophageal dilation ;  Surgeon: Rogene Houston, MD;  Location: [facility];  Service: Endoscopy;  Laterality: N/A;  . ESOPHAGOGASTRODUODENOSCOPY N/A 05/23/2014   Procedure: ESOPHAGOGASTRODUODENOSCOPY (EGD);  Surgeon: Rogene Houston, MD;  Location: [facility];  Service: Endoscopy;  Laterality: N/A;  . ESOPHAGOGASTRODUODENOSCOPY (EGD) WITH PROPOFOL  N/A 02/18/2017   Procedure: ESOPHAGOGASTRODUODENOSCOPY (EGD) WITH PROPOFOL;  Surgeon: Wonda Horner, MD;  Location: [facility];  Service: Endoscopy;  Laterality: N/A;  . [place]  . IR CHOLANGIOGRAM EXISTING TUBE  11/26/2016  . KIDNEY STONE SURGERY  1970's  . PARTIAL  COLECTOMY  ~ 40 years ago   some of large and small  . PORTA CATH INSERTION N/A 03/17/2017   Procedure: PORTA CATH INSERTION;  Surgeon: Katha Cabal, MD;  Location: [facility];  Service: Cardiovascular;  Laterality: N/A;  . PROSTATE SURGERY    . THYROIDECTOMY Right 05/04/2012   Procedure: RIGHT HEMI THYROIDECTOMY;  Surgeon: Ascencion Dike, MD;  Location: [facility];  Service: ENT;  Laterality: Right;  . THYROIDECTOMY    . TONSILLECTOMY AND ADENOIDECTOMY     as a child  . TRANSURETHRAL PROSTATECTOMY WITH GYRUS INSTRUMENTS N/A 07/11/2012   Procedure: TRANSURETHRAL PROSTATECTOMY WITH GYRUS INSTRUMENTS;  Surgeon: Claybon Jabs, MD;  Location: [facility];  Service: Urology;  Laterality: N/A;  . UPPER GASTROINTESTINAL ENDOSCOPY      FAMILY HISTORY: Family History  Problem Relation Age of Onset  . Diabetes Mother   . Stroke Father   . Healthy Daughter   . Healthy Son   . Heart disease Sister     ADVANCED DIRECTIVES (Y/N):  N  HEALTH MAINTENANCE: Social History   Tobacco Use  . Smoking status: Never Smoker  . Smokeless tobacco: Never Used  Substance Use Topics  . Alcohol use: No    Alcohol/week: 0.0 oz  . Drug use: No     Colonoscopy:  PAP:  Bone density:  Lipid panel:  No Known Allergies  Current Outpatient Medications  Medication Sig Dispense Refill  . acetaminophen (TYLENOL) 500 MG tablet Take 1,000 mg by mouth every 4 (four) hours as needed for moderate pain or headache.     . allopurinol (ZYLOPRIM) 300 MG tablet Take 1 tablet (300 mg total) by mouth every morning. 90 tablet 3  . amLODipine (NORVASC) 5 MG tablet TAKE 1 TABLET BY MOUTH  DAILY 90 tablet 3  . aspirin EC 81 MG tablet Take 81 mg by mouth at bedtime.    . bisacodyl (DULCOLAX) 10 MG suppository Place 1 suppository (10 mg total) rectally daily as needed for moderate constipation. 12 suppository 0  . fentaNYL (DURAGESIC - DOSED MCG/HR) 25 MCG/HR patch Place 1 patch (25 mcg total) onto the skin every 3 (three)  days. 10 patch 0  . fluticasone (FLONASE) 50 MCG/ACT nasal spray USE 1 SPRAY INTO BOTH  NOSTRILS DAILY 32 g 0  . Histamine Dihydrochloride (AUSTRALIAN DREAM ARTHRITIS EX) Apply 1 application topically 2 (two) times daily as needed (back pain).     Marland Kitchen HYDROcodone-acetaminophen (NORCO/VICODIN) 5-325 MG tablet Take 1-2 tablets by mouth every 4 (four) hours as needed for moderate pain. 30 tablet 0  . lidocaine-prilocaine (EMLA) cream Apply to port 1 hours prior to chemotherapy appointment. Cover with plastic wrap. 30 g 3  . losartan (COZAAR) 100 MG tablet Take 1 tablet (100 mg total) by mouth daily. 90 tablet 3  . megestrol (MEGACE) 40 MG tablet Take 1 tablet (40 mg total) by mouth daily. 30 tablet 2  . mesalamine (PENTASA) 500 MG CR capsule Take 1 capsule (500 mg total) by mouth 4 (four) times daily. (Patient taking differently: Take 1,000 mg by mouth 2 (two) times daily. ) 360 capsule 3  . metoprolol tartrate (LOPRESSOR) 50 MG tablet Take 1 tablet (50  mg total) by mouth 2 (two) times daily. 180 tablet 3  . Multiple Vitamin (MULTIVITAMIN WITH MINERALS) TABS tablet Take 2 tablets by mouth daily.    . ondansetron (ZOFRAN) 8 MG tablet Take 1 tablet (8 mg total) by mouth 2 (two) times daily. 30 tablet 3  . oxybutynin (DITROPAN-XL) 5 MG 24 hr tablet Take 1 tablet (5 mg total) by mouth at bedtime. 90 tablet 1  . pantoprazole (PROTONIX) 40 MG tablet Take 1 tablet (40 mg total) by mouth daily. 90 tablet 1  . simvastatin (ZOCOR) 20 MG tablet Take 1 tablet (20 mg total) by mouth daily. 90 tablet 3  . vitamin B-12 (CYANOCOBALAMIN) 1000 MCG tablet Take 1,000 mcg by mouth 2 (two) times daily.    Marland Kitchen lactulose (CHRONULAC) 10 GM/15ML solution Take 15 mLs (10 g total) by mouth daily. (Patient not taking: Reported on 07/23/2017) 240 mL 1   No current facility-administered medications for this visit.    Facility-Administered Medications Ordered in Other Visits  Medication Dose Route Frequency Provider Last Rate Last Dose   . heparin lock flush 100 unit/mL  500 Units Intravenous Once Faythe Casa E, NP      . sodium chloride flush (NS) 0.9 % injection 10 mL  10 mL Intravenous PRN Jacquelin Hawking, NP   10 mL at 07/12/17 1445    OBJECTIVE: Vitals:   08/11/17 0905  BP: 117/72  Pulse: 78  Resp: 20  Temp: (!) 96.8 F (36 C)     Body mass index is 26.95 kg/m.    ECOG FS:1 - Symptomatic but completely ambulatory  General: Well-developed, well-nourished, no acute distress. Eyes: Pink conjunctiva, anicteric sclera. Lungs: Clear to auscultation bilaterally. Heart: Regular rate and rhythm. No rubs, murmurs, or gallops. Abdomen: Soft, nontender, nondistended. No organomegaly noted, normoactive bowel sounds. Musculoskeletal: No edema, cyanosis, or clubbing. Neuro: Alert, answering all questions appropriately. Cranial nerves grossly intact. Skin: No rashes or petechiae noted. Psych: Normal affect.  LAB RESULTS:  Lab Results  Component Value Date   NA 135 08/11/2017   K 4.7 08/11/2017   CL 108 08/11/2017   CO2 18 (L) 08/11/2017   GLUCOSE 193 (H) 08/11/2017   BUN 32 (H) 08/11/2017   CREATININE 1.01 08/11/2017   CALCIUM 9.4 08/11/2017   PROT 7.0 08/11/2017   ALBUMIN 3.6 08/11/2017   AST 45 (H) 08/11/2017   ALT 35 08/11/2017   ALKPHOS 318 (H) 08/11/2017   BILITOT 0.4 08/11/2017   GFRNONAA >60 08/11/2017   GFRAA >60 08/11/2017    Lab Results  Component Value Date   WBC 8.6 08/11/2017   NEUTROABS 6.3 08/11/2017   HGB 9.9 (L) 08/11/2017   HCT 28.5 (L) 08/11/2017   MCV 102.8 (H) 08/11/2017   PLT 184 08/11/2017     STUDIES: No results found.  ASSESSMENT: Adenocarcinoma of gallbladder.  PLAN:    1. Adenocarcinoma of gallbladder: Restaging PET results from July 06, 2017 reviewed independently and report as above with improvement of disease burden.  Patient still has residual disease, therefore we will continue treatment as planned.  Both gemcitabine and cisplatin have been dose reduced.  He  continues to receive cisplatin and gemcitabine on day 1 and then gemcitabine only on days 8 and day 15.  This is a 28-day cycle.  Cycle 4, day 15 of treatment was skipped secondary to decreased performance status and pancytopenia.  Proceed with cycle 5, day 1 today.  Return to clinic in 1 week for further evaluation and consideration  of cycle 5, day 8.  Plan to reimage at the conclusion of cycle 6.   2.  Poor appetite: Significantly improved.  Appreciate dietary input.  Continue Megace as needed. 3.  Hyperglycemia: Blood glucose slightly worse today at 193.  Monitor.   4.  Pain: Patient does not complain of this today.  Chronic and unchanged.  Continue fentanyl patch and hydrocodone as prescribed. 5.  Constipation: Patient does not complain of this today.  Monitor Dulcolax use given his complaint of loose stools. 6.  Burping/indigestion: Patient does not complain of this today.  Continue OTC treatment. 7.  Hyponatremia: Resolved.   8.  Elevated liver enzymes: AST mildly elevated at 45. 9.  Anemia: Hemoglobin improved with recent blood transfusion.  Continue to monitor closely. 10.  Thrombocytopenia: Resolved.  Proceed with treatment as above.  Both gemcitabine and cisplatin have been dose reduced. 11.  Neutropenia: Resolved.   12.  Weakness and fatigue: Continue CARE program as ordered.  Patient expressed understanding and was in agreement with this plan. He also understands that He can call clinic at any time with any questions, concerns, or complaints.   Cancer Staging Adenocarcinoma of gallbladder [facility]) Staging form: Gallbladder, AJCC 8th Edition - Clinical stage from 03/03/2017: Stage IVB (cT3, cN0, cM1) - Signed by Lloyd Huger, MD on 03/23/2017   Lloyd Huger, MD   08/13/2017 1:55 PM

## 2017-08-10 ENCOUNTER — Other Ambulatory Visit: Payer: Self-pay

## 2017-08-10 MED ORDER — PANTOPRAZOLE SODIUM 40 MG PO TBEC: 40.0000 mg | DELAYED_RELEASE_TABLET | Freq: Every day | ORAL | 1 refills | Status: AC

## 2017-08-11 ENCOUNTER — Inpatient Hospital Stay (HOSPITAL_BASED_OUTPATIENT_CLINIC_OR_DEPARTMENT_OTHER): Payer: Medicare Other | Admitting: Oncology

## 2017-08-11 ENCOUNTER — Inpatient Hospital Stay: Payer: Medicare Other | Attending: Oncology

## 2017-08-11 ENCOUNTER — Inpatient Hospital Stay: Payer: Medicare Other

## 2017-08-11 ENCOUNTER — Encounter: Payer: Self-pay | Admitting: Oncology

## 2017-08-11 VITALS — BP 117/72 | HR 78 | Temp 96.8°F | Resp 20 | Wt 182.5 lb

## 2017-08-11 DIAGNOSIS — G4730 Sleep apnea, unspecified: Secondary | ICD-10-CM | POA: Diagnosis not present

## 2017-08-11 DIAGNOSIS — D696 Thrombocytopenia, unspecified: Secondary | ICD-10-CM | POA: Diagnosis not present

## 2017-08-11 DIAGNOSIS — Z5111 Encounter for antineoplastic chemotherapy: Secondary | ICD-10-CM

## 2017-08-11 DIAGNOSIS — I10 Essential (primary) hypertension: Secondary | ICD-10-CM | POA: Insufficient documentation

## 2017-08-11 DIAGNOSIS — Z79899 Other long term (current) drug therapy: Secondary | ICD-10-CM

## 2017-08-11 DIAGNOSIS — M109 Gout, unspecified: Secondary | ICD-10-CM

## 2017-08-11 DIAGNOSIS — Z7982 Long term (current) use of aspirin: Secondary | ICD-10-CM

## 2017-08-11 DIAGNOSIS — F418 Other specified anxiety disorders: Secondary | ICD-10-CM | POA: Insufficient documentation

## 2017-08-11 DIAGNOSIS — E860 Dehydration: Secondary | ICD-10-CM | POA: Insufficient documentation

## 2017-08-11 DIAGNOSIS — R748 Abnormal levels of other serum enzymes: Secondary | ICD-10-CM | POA: Diagnosis not present

## 2017-08-11 DIAGNOSIS — R531 Weakness: Secondary | ICD-10-CM | POA: Insufficient documentation

## 2017-08-11 DIAGNOSIS — D649 Anemia, unspecified: Secondary | ICD-10-CM | POA: Diagnosis not present

## 2017-08-11 DIAGNOSIS — C23 Malignant neoplasm of gallbladder: Secondary | ICD-10-CM

## 2017-08-11 DIAGNOSIS — E1165 Type 2 diabetes mellitus with hyperglycemia: Secondary | ICD-10-CM

## 2017-08-11 DIAGNOSIS — K219 Gastro-esophageal reflux disease without esophagitis: Secondary | ICD-10-CM | POA: Diagnosis not present

## 2017-08-11 DIAGNOSIS — R5383 Other fatigue: Secondary | ICD-10-CM

## 2017-08-11 DIAGNOSIS — M129 Arthropathy, unspecified: Secondary | ICD-10-CM | POA: Insufficient documentation

## 2017-08-11 DIAGNOSIS — R0602 Shortness of breath: Secondary | ICD-10-CM

## 2017-08-11 DIAGNOSIS — K5090 Crohn's disease, unspecified, without complications: Secondary | ICD-10-CM | POA: Insufficient documentation

## 2017-08-11 DIAGNOSIS — E7800 Pure hypercholesterolemia, unspecified: Secondary | ICD-10-CM

## 2017-08-11 LAB — CBC WITH DIFFERENTIAL/PLATELET
BASOS ABS: 0.1 10*3/uL (ref 0–0.1)
Basophils Relative: 1 %
EOS PCT: 1 %
Eosinophils Absolute: 0.1 10*3/uL (ref 0–0.7)
HCT: 28.5 % — ABNORMAL LOW (ref 40.0–52.0)
Hemoglobin: 9.9 g/dL — ABNORMAL LOW (ref 13.0–18.0)
Lymphocytes Relative: 13 %
Lymphs Abs: 1.1 10*3/uL (ref 1.0–3.6)
MCH: 35.7 pg — AB (ref 26.0–34.0)
MCHC: 34.7 g/dL (ref 32.0–36.0)
MCV: 102.8 fL — ABNORMAL HIGH (ref 80.0–100.0)
MONO ABS: 1 10*3/uL (ref 0.2–1.0)
MONOS PCT: 12 %
Neutro Abs: 6.3 10*3/uL (ref 1.4–6.5)
Neutrophils Relative %: 73 %
PLATELETS: 184 10*3/uL (ref 150–440)
RBC: 2.78 MIL/uL — ABNORMAL LOW (ref 4.40–5.90)
RDW: 19.8 % — AB (ref 11.5–14.5)
WBC: 8.6 10*3/uL (ref 3.8–10.6)

## 2017-08-11 LAB — COMPREHENSIVE METABOLIC PANEL
ALBUMIN: 3.6 g/dL (ref 3.5–5.0)
ALK PHOS: 318 U/L — AB (ref 38–126)
ALT: 35 U/L (ref 0–44)
AST: 45 U/L — AB (ref 15–41)
Anion gap: 9 (ref 5–15)
BILIRUBIN TOTAL: 0.4 mg/dL (ref 0.3–1.2)
BUN: 32 mg/dL — AB (ref 8–23)
CO2: 18 mmol/L — AB (ref 22–32)
Calcium: 9.4 mg/dL (ref 8.9–10.3)
Chloride: 108 mmol/L (ref 98–111)
Creatinine, Ser: 1.01 mg/dL (ref 0.61–1.24)
GFR calc Af Amer: >60 mL/min (ref >60)
GFR calc non Af Amer: >60 mL/min (ref >60)
GLUCOSE: 193 mg/dL — AB (ref 70–99)
POTASSIUM: 4.7 mmol/L (ref 3.5–5.1)
Sodium: 135 mmol/L (ref 135–145)
TOTAL PROTEIN: 7 g/dL (ref 6.5–8.1)

## 2017-08-11 LAB — SAMPLE TO BLOOD BANK

## 2017-08-11 MED ORDER — GEMCITABINE HCL CHEMO INJECTION 1 GM/26.3ML
2000.0000 mg | Freq: Once | INTRAVENOUS | Status: AC
  Administered 2017-08-11: 2000 mg via INTRAVENOUS
  Filled 2017-08-11: qty 52.6

## 2017-08-11 MED ORDER — SODIUM CHLORIDE 0.9 % IV SOLN
Freq: Once | INTRAVENOUS | Status: AC
  Administered 2017-08-11: 13:00:00 via INTRAVENOUS
  Filled 2017-08-11: qty 5

## 2017-08-11 MED ORDER — SODIUM CHLORIDE 0.9 % IV SOLN
Freq: Once | INTRAVENOUS | Status: AC
  Administered 2017-08-11: 10:00:00 via INTRAVENOUS
  Filled 2017-08-11: qty 1000

## 2017-08-11 MED ORDER — POTASSIUM CHLORIDE 2 MEQ/ML IV SOLN
Freq: Once | INTRAVENOUS | Status: AC
  Administered 2017-08-11: 10:00:00 via INTRAVENOUS
  Filled 2017-08-11: qty 1000

## 2017-08-11 MED ORDER — PALONOSETRON HCL INJECTION 0.25 MG/5ML
0.2500 mg | Freq: Once | INTRAVENOUS | Status: AC
  Administered 2017-08-11: 0.25 mg via INTRAVENOUS
  Filled 2017-08-11: qty 5

## 2017-08-11 MED ORDER — SODIUM CHLORIDE 0.9% FLUSH
10.0000 mL | INTRAVENOUS | Status: DC | PRN
  Administered 2017-08-11: 10 mL via INTRAVENOUS
  Filled 2017-08-11: qty 10

## 2017-08-11 MED ORDER — SODIUM CHLORIDE 0.9 % IV SOLN
40.0000 mg/m2 | Freq: Once | INTRAVENOUS | Status: AC
  Administered 2017-08-11: 84 mg via INTRAVENOUS
  Filled 2017-08-11: qty 84

## 2017-08-11 MED ORDER — HEPARIN SOD (PORK) LOCK FLUSH 100 UNIT/ML IV SOLN
500.0000 [IU] | Freq: Once | INTRAVENOUS | Status: AC
  Administered 2017-08-11: 500 [IU] via INTRAVENOUS
  Filled 2017-08-11: qty 5

## 2017-08-11 NOTE — Progress Notes (Signed)
Patient reports frequent urination, loose stools and lack of energy.

## 2017-08-15 NOTE — Progress Notes (Signed)
[place]  [phone] [phone]) [phone]  ID: Thomas David OB: May 11, 1935  MR#: [id number]  UQJ#:[id number]  Patient Care Team: Leone Haven, MD as PCP - General (Family Medicine) Kathyrn Drown, MD (Family Medicine) Clent Jacks, RN as Registered Nurse  CHIEF COMPLAINT: Adenocarcinoma of gallbladder.  INTERVAL HISTORY: Patient returns to clinic today for further evaluation and consideration of cycle 5, day 8 of cisplatin and gemcitabine.  Gemcitabine only.  Continues to have increased weakness and fatigue after day 1 of each cycle, but otherwise is tolerating his treatments well.  He continues to have shortness of breath and dyspnea on exertion. He has an improved appetite with Megace.  His pain is well controlled on his current narcotic regimen. He has no neurologic complaints.  He denies any recent fevers or illnesses. He denies any chest pain.  He does not complain of abdominal pain today.  He denies any nausea or vomiting.  He continues to have loose stools.  He also reports frequent urination.  Patient offers no further specific complaints today.  REVIEW OF SYSTEMS:   Review of Systems  Constitutional: Positive for malaise/fatigue. Negative for fever and weight loss.  Eyes: Negative for blurred vision.  Respiratory: Positive for shortness of breath. Negative for cough.   Cardiovascular: Negative.  Negative for chest pain and leg swelling.  Gastrointestinal: Negative for abdominal pain, blood in stool, constipation, heartburn, melena and nausea.  Genitourinary: Positive for frequency. Negative for dysuria.  Musculoskeletal: Negative.  Negative for falls.  Skin: Negative.  Negative for rash.  Neurological: Positive for weakness. Negative for sensory change and focal weakness.  Psychiatric/Behavioral: Negative.  The patient is not nervous/anxious.     As per HPI. Otherwise, a complete review of systems is negative.  PAST MEDICAL  HISTORY: Past Medical History:  Diagnosis Date  . Anxiety   . Arthritis   . Chronic lower back pain   . Crohn's disease ([place])   . Depression   . Difficult intubation    patient denies on 02/12/17  . GERD (gastroesophageal reflux disease)   . Gout   . History of kidney stones 40 years ago  . Hypercholesteremia   . Hypertension   . Prediabetes   . Sleep apnea    cpap     PAST SURGICAL HISTORY: Past Surgical History:  Procedure Laterality Date  . CHOLECYSTECTOMY N/A 11/03/2016   Procedure: LAPAROSCOPIC CHOLECYSTostomy with tube placement ;  Surgeon: Johnathan Hausen, MD;  Location: [facility];  Service: General;  Laterality: N/A;  . CHOLECYSTECTOMY N/A 02/15/2017   Procedure: LAPAROSCOPIC SUBTOTAL CHOLECYSTECTOMY, BIOPSY OF GALLBLADDER;  Surgeon: Johnathan Hausen, MD;  Location: [facility];  Service: General;  Laterality: N/A;  . COLECTOMY  1973   "part of large intestines" (05/04/2012)  . COLON RESECTION    . COLONOSCOPY    . COLONOSCOPY N/A 05/23/2014   Procedure: COLONOSCOPY;  Surgeon: Rogene Houston, MD;  [facility]: [facility];  Service: Endoscopy;  Laterality: N/A;  1200  . ESOPHAGEAL DILATION N/A 05/23/2014   Procedure: ESOPHAGEAL DILATION;  Surgeon: Rogene Houston, MD;  [facility];  Service: Endoscopy;  Laterality: N/A;  . ESOPHAGOGASTRODUODENOSCOPY N/A 03/16/2014   Procedure: esophageal dilation ;  Surgeon: Rogene Houston, MD;  [facility];  Service: Endoscopy;  Laterality: N/A;  . ESOPHAGOGASTRODUODENOSCOPY N/A 05/23/2014   Procedure: ESOPHAGOGASTRODUODENOSCOPY (EGD);  Surgeon: Rogene Houston, MD;  [facility];  Service: Endoscopy;  Laterality: N/A;  .  ESOPHAGOGASTRODUODENOSCOPY (EGD) WITH PROPOFOL N/A 02/18/2017   Procedure: ESOPHAGOGASTRODUODENOSCOPY (EGD) WITH PROPOFOL;  Surgeon: Wonda Horner, MD;  Location: [facility];  Service: Endoscopy;  Laterality: N/A;  . [place]  . IR CHOLANGIOGRAM EXISTING TUBE  11/26/2016  .  KIDNEY STONE SURGERY  1970's  . PARTIAL COLECTOMY  ~ 40 years ago   some of large and small  . PORTA CATH INSERTION N/A 03/17/2017   Procedure: PORTA CATH INSERTION;  Surgeon: Katha Cabal, MD;  Location: [facility];  Service: Cardiovascular;  Laterality: N/A;  . PROSTATE SURGERY    . THYROIDECTOMY Right 05/04/2012   Procedure: RIGHT HEMI THYROIDECTOMY;  Surgeon: Ascencion Dike, MD;  Location: [facility];  Service: ENT;  Laterality: Right;  . THYROIDECTOMY    . TONSILLECTOMY AND ADENOIDECTOMY     as a child  . TRANSURETHRAL PROSTATECTOMY WITH GYRUS INSTRUMENTS N/A 07/11/2012   Procedure: TRANSURETHRAL PROSTATECTOMY WITH GYRUS INSTRUMENTS;  Surgeon: Claybon Jabs, MD;  Location: [facility];  Service: Urology;  Laterality: N/A;  . UPPER GASTROINTESTINAL ENDOSCOPY      FAMILY HISTORY: Family History  Problem Relation Age of Onset  . Diabetes Mother   . Stroke Father   . Healthy Daughter   . Healthy Son   . Heart disease Sister     ADVANCED DIRECTIVES (Y/N):  N  HEALTH MAINTENANCE: Social History   Tobacco Use  . Smoking status: Never Smoker  . Smokeless tobacco: Never Used  Substance Use Topics  . Alcohol use: No    Alcohol/week: 0.0 oz  . Drug use: No     Colonoscopy:  PAP:  Bone density:  Lipid panel:  No Known Allergies  Current Outpatient Medications  Medication Sig Dispense Refill  . acetaminophen (TYLENOL) 500 MG tablet Take 1,000 mg by mouth every 4 (four) hours as needed for moderate pain or headache.     . allopurinol (ZYLOPRIM) 300 MG tablet Take 1 tablet (300 mg total) by mouth every morning. 90 tablet 3  . amLODipine (NORVASC) 5 MG tablet TAKE 1 TABLET BY MOUTH  DAILY 90 tablet 3  . aspirin EC 81 MG tablet Take 81 mg by mouth at bedtime.    . bisacodyl (DULCOLAX) 10 MG suppository Place 1 suppository (10 mg total) rectally daily as needed for moderate constipation. 12 suppository 0  . fluticasone (FLONASE) 50 MCG/ACT nasal spray USE 1 SPRAY INTO BOTH   NOSTRILS DAILY 32 g 0  . Histamine Dihydrochloride (AUSTRALIAN DREAM ARTHRITIS EX) Apply 1 application topically 2 (two) times daily as needed (back pain).     Marland Kitchen HYDROcodone-acetaminophen (NORCO/VICODIN) 5-325 MG tablet Take 1-2 tablets by mouth every 4 (four) hours as needed for moderate pain. 30 tablet 0  . lactulose (CHRONULAC) 10 GM/15ML solution Take 15 mLs (10 g total) by mouth daily. 240 mL 1  . lidocaine-prilocaine (EMLA) cream Apply to port 1 hours prior to chemotherapy appointment. Cover with plastic wrap. 30 g 3  . losartan (COZAAR) 100 MG tablet Take 1 tablet (100 mg total) by mouth daily. 90 tablet 3  . mesalamine (PENTASA) 500 MG CR capsule Take 1 capsule (500 mg total) by mouth 4 (four) times daily. (Patient taking differently: Take 1,000 mg by mouth 2 (two) times daily. ) 360 capsule 3  . metoprolol tartrate (LOPRESSOR) 50 MG tablet Take 1 tablet (50 mg total) by mouth 2 (two) times daily. 180 tablet 3  . Multiple Vitamin (MULTIVITAMIN WITH MINERALS) TABS tablet Take 2  tablets by mouth daily.    . ondansetron (ZOFRAN) 8 MG tablet Take 1 tablet (8 mg total) by mouth 2 (two) times daily. 30 tablet 3  . oxybutynin (DITROPAN-XL) 5 MG 24 hr tablet Take 1 tablet (5 mg total) by mouth at bedtime. 90 tablet 1  . pantoprazole (PROTONIX) 40 MG tablet Take 1 tablet (40 mg total) by mouth daily. 90 tablet 1  . Simethicone 180 MG CAPS Take 1 capsule (180 mg total) by mouth 3 (three) times daily as needed.  0  . simvastatin (ZOCOR) 20 MG tablet Take 1 tablet (20 mg total) by mouth daily. 90 tablet 3  . vitamin B-12 (CYANOCOBALAMIN) 1000 MCG tablet Take 1,000 mcg by mouth 2 (two) times daily.    . fentaNYL (DURAGESIC - DOSED MCG/HR) 25 MCG/HR patch Place 1 patch (25 mcg total) onto the skin every 3 (three) days. 10 patch 0  . megestrol (MEGACE) 40 MG tablet Take 1 tablet (40 mg total) by mouth daily. 30 tablet 2   No current facility-administered medications for this visit.     Facility-Administered Medications Ordered in Other Visits  Medication Dose Route Frequency Provider Last Rate Last Dose  . heparin lock flush 100 unit/mL  500 Units Intravenous Once Faythe Casa E, NP      . sodium chloride flush (NS) 0.9 % injection 10 mL  10 mL Intravenous PRN Jacquelin Hawking, NP   10 mL at 07/12/17 1445    OBJECTIVE: Vitals:   08/18/17 0840  BP: 111/64  Pulse: 85  Resp: 20  Temp: (!) 96.2 F (35.7 C)  SpO2: 98%     Body mass index is 26.73 kg/m.    ECOG FS:1 - Symptomatic but completely ambulatory  General: Well-developed, well-nourished, no acute distress. Eyes: Pink conjunctiva, anicteric sclera. HEENT: Normocephalic, moist mucous membranes. Lungs: Clear to auscultation bilaterally. Heart: Regular rate and rhythm. No rubs, murmurs, or gallops. Abdomen: Soft, nontender, nondistended. No organomegaly noted, normoactive bowel sounds. Musculoskeletal: No edema, cyanosis, or clubbing. Neuro: Alert, answering all questions appropriately. Cranial nerves grossly intact. Skin: No rashes or petechiae noted. Psych: Normal affect.  LAB RESULTS:  Lab Results  Component Value Date   NA 135 08/18/2017   K 4.1 08/18/2017   CL 107 08/18/2017   CO2 18 (L) 08/18/2017   GLUCOSE 157 (H) 08/18/2017   BUN 31 (H) 08/18/2017   CREATININE 1.30 (H) 08/18/2017   CALCIUM 8.8 (L) 08/18/2017   PROT 6.9 08/18/2017   ALBUMIN 3.4 (L) 08/18/2017   AST 37 08/18/2017   ALT 38 08/18/2017   ALKPHOS 304 (H) 08/18/2017   BILITOT 0.5 08/18/2017   GFRNONAA 51 (L) 08/18/2017   GFRAA 59 (L) 08/18/2017    Lab Results  Component Value Date   WBC 5.4 08/18/2017   NEUTROABS 4.1 08/18/2017   HGB 9.8 (L) 08/18/2017   HCT 28.2 (L) 08/18/2017   MCV 101.5 (H) 08/18/2017   PLT 151 08/18/2017     STUDIES: No results found.  ASSESSMENT: Adenocarcinoma of gallbladder.  PLAN:    1. Adenocarcinoma of gallbladder: Restaging PET results from July 06, 2017 reviewed independently  with improvement of disease burden.  Patient still has residual disease, therefore we will continue treatment as planned.  Both gemcitabine and cisplatin have been dose reduced.  He continues to receive cisplatin and gemcitabine on day 1 and then gemcitabine only on days 8 and day 15.  This is a 28-day cycle.  Cycle 4, day 15 of treatment was  skipped secondary to decreased performance status and pancytopenia. Proceed with cycle 5, day 8 today which is dose reduced gemcitabine only.  Return to clinic in 1 week for further evaluation and consideration of cycle 5, day 15.  Plan to reimage at the conclusion of cycle 6.   2.  Poor appetite: Significantly improved.  Appreciate dietary input.  Patient was given a refill of his Megace today.   3.  Hyperglycemia: Patient has improved blood glucose control.  Monitor. 4.  Pain: Well controlled on his current narcotic regimen.  Patient was given a refill of his fentanyl patch and hydrocodone today.   5.  Constipation: Patient does not complain of this today. 6.  Burping/indigestion: Patient does not complain of this today.  Continue OTC treatment. 7.  Hyponatremia: Resolved.   8.  Elevated liver enzymes: Resolved.  Proceed with treatment as above. 9.  Anemia: Hemoglobin stable at 9.8.  He does not require an additional blood transfusion today, monitor.   10.  Thrombocytopenia: Resolved.  Proceed with treatment as above.  Both gemcitabine and cisplatin have been dose reduced. 11.  Neutropenia: Resolved.   12.  Weakness and fatigue: Continue CARE program as ordered.  Patient expressed understanding and was in agreement with this plan. He also understands that He can call clinic at any time with any questions, concerns, or complaints.   Cancer Staging Adenocarcinoma of gallbladder [facility]) Staging form: Gallbladder, AJCC 8th Edition - Clinical stage from 03/03/2017: Stage IVB (cT3, cN0, cM1) - Signed by Lloyd Huger, MD on 03/23/2017   Lloyd Huger, MD    08/22/2017 9:29 AM

## 2017-08-17 ENCOUNTER — Other Ambulatory Visit: Payer: Self-pay

## 2017-08-17 ENCOUNTER — Ambulatory Visit (INDEPENDENT_AMBULATORY_CARE_PROVIDER_SITE_OTHER): Payer: Medicare Other | Admitting: Internal Medicine

## 2017-08-17 ENCOUNTER — Encounter (INDEPENDENT_AMBULATORY_CARE_PROVIDER_SITE_OTHER): Payer: Self-pay | Admitting: Internal Medicine

## 2017-08-17 VITALS — BP 110/70 | HR 62 | Temp 97.8°F | Resp 18 | Ht 69.0 in | Wt 181.1 lb

## 2017-08-17 DIAGNOSIS — R142 Eructation: Secondary | ICD-10-CM | POA: Diagnosis not present

## 2017-08-17 DIAGNOSIS — K5080 Crohn's disease of both small and large intestine without complications: Secondary | ICD-10-CM

## 2017-08-17 DIAGNOSIS — C23 Malignant neoplasm of gallbladder: Secondary | ICD-10-CM

## 2017-08-17 MED ORDER — SIMETHICONE 180 MG PO CAPS: 180.0000 mg | ORAL_CAPSULE | Freq: Three times a day (TID) | ORAL | 0 refills | Status: DC | PRN

## 2017-08-17 NOTE — Progress Notes (Signed)
Presenting complaint;  Follow-up for Crohn's disease. Patient with known metastatic gallbladder adenocarcinoma  Database and subjective:   patient is 82 year old Caucasian male who has history of ileocolonic Crohn's disease with remote right hemicolectomy who was last seen  on 02/11/2017.  On his prior visit of September 08, 2016 he was discovered to have new right sided abdominal mass which turned out to be gallbladder mass.  Initially was felt to be empyema. He was not acutely ill.  He was referred to Dr. Johnathan Hausen.  In the meantime his wife was diagnosed with advanced pancreatic carcinoma and surgery was delayed until 11/03/2016.  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  Cholecystostomy was performed. He underwent laparoscopic subtotal colectomy on 02/15/2017.  He was found to have gallbladder mass which turned out to be adenocarcinoma.  He was subsequently diagnosed with metastatic disease and has been receiving chemotherapy.  On his last visit he was complaining of right upper quadrant abdominal pain. He states he is not doing well.  He has lost close to 25 pounds.  He states most of the weight loss occurred after his second surgery.  He feels he has not lost much weight over the last 3 to 4 months.  He has mild right subcostal pain.  He has no appetite.  He has frequent burping but no nausea or vomiting.  His stools been loose.  Last week he went 4 days without a bowel movement.  He has been taking MiraLAX.  Now he is having 2-3 stools per day.  He denies melena or rectal bleeding or lower abdominal pain.  He states he has another cycle of chemotherapy to go.  His last PET scan was about 6 weeks ago and showed that he is responding to therapy.  He is hoping that he would feel better once chemotherapy is discontinued. He denies heartburn or dysphagia.  Current Medications: Outpatient Encounter Medications as of 08/17/2017  Medication Sig  . acetaminophen (TYLENOL) 500 MG  tablet Take 1,000 mg by mouth every 4 (four) hours as needed for moderate pain or headache.   . allopurinol (ZYLOPRIM) 300 MG tablet Take 1 tablet (300 mg total) by mouth every morning.  Marland Kitchen amLODipine (NORVASC) 5 MG tablet TAKE 1 TABLET BY MOUTH  DAILY  . aspirin EC 81 MG tablet Take 81 mg by mouth at bedtime.  . bisacodyl (DULCOLAX) 10 MG suppository Place 1 suppository (10 mg total) rectally daily as needed for moderate constipation.  . fentaNYL (DURAGESIC - DOSED MCG/HR) 25 MCG/HR patch Place 1 patch (25 mcg total) onto the skin every 3 (three) days.  . fluticasone (FLONASE) 50 MCG/ACT nasal spray USE 1 SPRAY INTO BOTH  NOSTRILS DAILY  . Histamine Dihydrochloride (AUSTRALIAN DREAM ARTHRITIS EX) Apply 1 application topically 2 (two) times daily as needed (back pain).   Marland Kitchen HYDROcodone-acetaminophen (NORCO/VICODIN) 5-325 MG tablet Take 1-2 tablets by mouth every 4 (four) hours as needed for moderate pain.  Marland Kitchen lactulose (CHRONULAC) 10 GM/15ML solution Take 15 mLs (10 g total) by mouth daily.  Marland Kitchen lidocaine-prilocaine (EMLA) cream Apply to port 1 hours prior to chemotherapy appointment. Cover with plastic wrap.  . losartan (COZAAR) 100 MG tablet Take 1 tablet (100 mg total) by mouth daily.  . megestrol (MEGACE) 40 MG tablet Take 1 tablet (40 mg total) by mouth daily.  . mesalamine (PENTASA) 500 MG CR capsule Take 1 capsule (500 mg total) by mouth 4 (four) times daily. (Patient taking differently: Take 1,000 mg  by mouth 2 (two) times daily. )  . metoprolol tartrate (LOPRESSOR) 50 MG tablet Take 1 tablet (50 mg total) by mouth 2 (two) times daily.  . Multiple Vitamin (MULTIVITAMIN WITH MINERALS) TABS tablet Take 2 tablets by mouth daily.  Marland Kitchen oxybutynin (DITROPAN-XL) 5 MG 24 hr tablet Take 1 tablet (5 mg total) by mouth at bedtime.  . pantoprazole (PROTONIX) 40 MG tablet Take 1 tablet (40 mg total) by mouth daily.  . simvastatin (ZOCOR) 20 MG tablet Take 1 tablet (20 mg total) by mouth daily.  . vitamin B-12  (CYANOCOBALAMIN) 1000 MCG tablet Take 1,000 mcg by mouth 2 (two) times daily.  . ondansetron (ZOFRAN) 8 MG tablet Take 1 tablet (8 mg total) by mouth 2 (two) times daily. (Patient not taking: Reported on 08/17/2017)   Facility-Administered Encounter Medications as of 08/17/2017  Medication  . heparin lock flush 100 unit/mL  . sodium chloride flush (NS) 0.9 % injection 10 mL     Objective: Blood pressure 110/70, pulse 62, temperature 97.8 F (36.6 C), temperature source Oral, resp. rate 18, height 5' 9"  (1.753 m), weight 181 lb 1.6 oz (82.1 kg). Patient is alert and in no acute distress. Conjunctiva is pink. Sclera is nonicteric Oropharyngeal mucosa is normal. No neck masses or thyromegaly noted. Cardiac exam with regular rhythm normal S1 and S2. No murmur or gallop noted. Lungs are clear to auscultation. Abdomenis full.  Bowel sounds are normal.  On palpation abdomen is soft.  He has easily palpable liver below the right costal margin and there is separate intra-abdominal mass which appears to be residual gallbladder this area is mildly tender. No LE edema or clubbing noted.  Labs/studies Results: Lab data of from 08/11/2017 WBC 8.6, H&H 9.9 and 28.5 and platelet count 184K. BUN 32, creatinine 1.01 Serum calcium 9.4. Serum sodium 135, potassium 4.7, chloride 108, CO2 18. Bilirubin 0.4, AP 318, AST 45, ALT 35, total protein 7.0 and albumin 3.6.  Abdominopelvic CT from 03/05/2017 reviewed. PET scan from 07/06/2017 reviewed and compared with prior study of 03/19/2017.   Assessment:  #1.  Ileocolonic Crohn's disease.  He appears to be in remission.  Review of CT from February 2019 does not reveal thickening to small or large bowel or dilation to small bowel proximal to ileocolonic anastomosis.  He has been having constipation which would appear to be due to diminished intake resulting from chemotherapy.  He is on polyethylene glycol and it is helping.  #2.  Anemia appears to be secondary to  chemotherapy.  No evidence of overt GI bleed.  #3.  Bloating.  Bloating could be due to residual gallbladder are metastatic disease.  He does not have dilated small bowel.  He could have gastroparesis.  He has a history of GERD and esophageal stricture but heartburn is well controlled with therapy and he is not having dysphagia.  Will monitor.  #4.  Metastatic gallbladder adenocarcinoma.  He is receiving chemotherapy and he is having nice response.  Mildly elevated AST and alkaline phosphatase possibly due to liver mets.  No evidence of dilated biliary system on recent imaging studies.  He is being followed by Dr. Delight Hoh.   Plan:  Patient will continue Pentasa at current dose of 1 g p.o. twice daily. Phazyme 180 mg p.o. 3 times daily for 1 week and thereafter on as-needed basis. Patient will call with progress report after he finishes chemotherapy. Office visit in 6 months.

## 2017-08-17 NOTE — Patient Instructions (Addendum)
Take Phazyme 1 capsule 3 times a day for 1 week and thereafter on as-needed basis.  Use it on schedule if it helps.  Please call office with progress report 2 weeks after finishing chemotherapy.

## 2017-08-17 NOTE — Patient Outreach (Signed)
[facility]) Care Management  08/17/2017  Vu Liebman Coyt Nov 15, 1935 [id number]   Medication Adherence call to Mr. Lavontay Kirk left a message for patient to call back patient is due on Simvastatin 20 mg and Losartan 100 mg Mr. Kines is showing past due under [place].   [place] Management Direct Dial [phone]  Fax [phone] [email or web address]

## 2017-08-18 ENCOUNTER — Other Ambulatory Visit: Payer: Self-pay | Admitting: *Deleted

## 2017-08-18 ENCOUNTER — Inpatient Hospital Stay (HOSPITAL_BASED_OUTPATIENT_CLINIC_OR_DEPARTMENT_OTHER): Payer: Medicare Other | Admitting: Oncology

## 2017-08-18 ENCOUNTER — Inpatient Hospital Stay: Payer: Medicare Other

## 2017-08-18 ENCOUNTER — Encounter: Payer: Self-pay | Admitting: Oncology

## 2017-08-18 ENCOUNTER — Other Ambulatory Visit: Payer: Self-pay

## 2017-08-18 VITALS — BP 111/64 | HR 85 | Temp 96.2°F | Resp 20 | Wt 181.0 lb

## 2017-08-18 DIAGNOSIS — C23 Malignant neoplasm of gallbladder: Secondary | ICD-10-CM

## 2017-08-18 DIAGNOSIS — M129 Arthropathy, unspecified: Secondary | ICD-10-CM

## 2017-08-18 DIAGNOSIS — F418 Other specified anxiety disorders: Secondary | ICD-10-CM

## 2017-08-18 DIAGNOSIS — R5383 Other fatigue: Secondary | ICD-10-CM | POA: Diagnosis not present

## 2017-08-18 DIAGNOSIS — R0602 Shortness of breath: Secondary | ICD-10-CM | POA: Diagnosis not present

## 2017-08-18 DIAGNOSIS — R531 Weakness: Secondary | ICD-10-CM

## 2017-08-18 DIAGNOSIS — Z79899 Other long term (current) drug therapy: Secondary | ICD-10-CM | POA: Diagnosis not present

## 2017-08-18 DIAGNOSIS — R748 Abnormal levels of other serum enzymes: Secondary | ICD-10-CM | POA: Diagnosis not present

## 2017-08-18 DIAGNOSIS — Z5111 Encounter for antineoplastic chemotherapy: Secondary | ICD-10-CM

## 2017-08-18 DIAGNOSIS — I10 Essential (primary) hypertension: Secondary | ICD-10-CM | POA: Diagnosis not present

## 2017-08-18 DIAGNOSIS — G4730 Sleep apnea, unspecified: Secondary | ICD-10-CM | POA: Diagnosis not present

## 2017-08-18 DIAGNOSIS — E1165 Type 2 diabetes mellitus with hyperglycemia: Secondary | ICD-10-CM

## 2017-08-18 DIAGNOSIS — E860 Dehydration: Secondary | ICD-10-CM | POA: Diagnosis not present

## 2017-08-18 DIAGNOSIS — Z7982 Long term (current) use of aspirin: Secondary | ICD-10-CM | POA: Diagnosis not present

## 2017-08-18 DIAGNOSIS — M109 Gout, unspecified: Secondary | ICD-10-CM

## 2017-08-18 DIAGNOSIS — D649 Anemia, unspecified: Secondary | ICD-10-CM

## 2017-08-18 DIAGNOSIS — E7800 Pure hypercholesterolemia, unspecified: Secondary | ICD-10-CM | POA: Diagnosis not present

## 2017-08-18 DIAGNOSIS — K219 Gastro-esophageal reflux disease without esophagitis: Secondary | ICD-10-CM

## 2017-08-18 DIAGNOSIS — K5090 Crohn's disease, unspecified, without complications: Secondary | ICD-10-CM | POA: Diagnosis not present

## 2017-08-18 DIAGNOSIS — D696 Thrombocytopenia, unspecified: Secondary | ICD-10-CM | POA: Diagnosis not present

## 2017-08-18 LAB — COMPREHENSIVE METABOLIC PANEL
ALT: 38 U/L (ref 0–44)
AST: 37 U/L (ref 15–41)
Albumin: 3.4 g/dL — ABNORMAL LOW (ref 3.5–5.0)
Alkaline Phosphatase: 304 U/L — ABNORMAL HIGH (ref 38–126)
Anion gap: 10 (ref 5–15)
BUN: 31 mg/dL — ABNORMAL HIGH (ref 8–23)
CHLORIDE: 107 mmol/L (ref 98–111)
CO2: 18 mmol/L — ABNORMAL LOW (ref 22–32)
CREATININE: 1.3 mg/dL — AB (ref 0.61–1.24)
Calcium: 8.8 mg/dL — ABNORMAL LOW (ref 8.9–10.3)
GFR calc non Af Amer: 51 mL/min — ABNORMAL LOW (ref >60)
GFR, EST AFRICAN AMERICAN: 59 mL/min — AB (ref >60)
Glucose, Bld: 157 mg/dL — ABNORMAL HIGH (ref 70–99)
POTASSIUM: 4.1 mmol/L (ref 3.5–5.1)
Sodium: 135 mmol/L (ref 135–145)
Total Bilirubin: 0.5 mg/dL (ref 0.3–1.2)
Total Protein: 6.9 g/dL (ref 6.5–8.1)

## 2017-08-18 LAB — SAMPLE TO BLOOD BANK

## 2017-08-18 LAB — CBC WITH DIFFERENTIAL/PLATELET
Basophils Absolute: 0 10*3/uL (ref 0–0.1)
Basophils Relative: 1 %
EOS ABS: 0 10*3/uL (ref 0–0.7)
Eosinophils Relative: 1 %
HCT: 28.2 % — ABNORMAL LOW (ref 40.0–52.0)
Hemoglobin: 9.8 g/dL — ABNORMAL LOW (ref 13.0–18.0)
Lymphocytes Relative: 13 %
Lymphs Abs: 0.7 10*3/uL — ABNORMAL LOW (ref 1.0–3.6)
MCH: 35.2 pg — ABNORMAL HIGH (ref 26.0–34.0)
MCHC: 34.7 g/dL (ref 32.0–36.0)
MCV: 101.5 fL — ABNORMAL HIGH (ref 80.0–100.0)
MONO ABS: 0.5 10*3/uL (ref 0.2–1.0)
MONOS PCT: 9 %
Neutro Abs: 4.1 10*3/uL (ref 1.4–6.5)
Neutrophils Relative %: 76 %
PLATELETS: 151 10*3/uL (ref 150–440)
RBC: 2.78 MIL/uL — ABNORMAL LOW (ref 4.40–5.90)
RDW: 18.5 % — AB (ref 11.5–14.5)
WBC: 5.4 10*3/uL (ref 3.8–10.6)

## 2017-08-18 MED ORDER — HEPARIN SOD (PORK) LOCK FLUSH 100 UNIT/ML IV SOLN
500.0000 [IU] | Freq: Once | INTRAVENOUS | Status: AC | PRN
  Administered 2017-08-18: 500 [IU]
  Filled 2017-08-18: qty 5

## 2017-08-18 MED ORDER — SODIUM CHLORIDE 0.9 % IV SOLN
Freq: Once | INTRAVENOUS | Status: AC
  Administered 2017-08-18: 09:00:00 via INTRAVENOUS
  Filled 2017-08-18: qty 1000

## 2017-08-18 MED ORDER — SODIUM CHLORIDE 0.9 % IV SOLN
1600.0000 mg | Freq: Once | INTRAVENOUS | Status: AC
  Administered 2017-08-18: 1600 mg via INTRAVENOUS
  Filled 2017-08-18: qty 26.3

## 2017-08-18 MED ORDER — PROCHLORPERAZINE MALEATE 10 MG PO TABS
10.0000 mg | ORAL_TABLET | Freq: Once | ORAL | Status: AC
Start: 2017-08-18 — End: 2017-08-18
  Administered 2017-08-18: 10 mg via ORAL
  Filled 2017-08-18: qty 1

## 2017-08-18 MED ORDER — MEGESTROL ACETATE 40 MG PO TABS: 40.0000 mg | ORAL_TABLET | Freq: Every day | ORAL | 2 refills | Status: DC

## 2017-08-18 MED ORDER — FENTANYL 25 MCG/HR TD PT72: 25.0000 ug | MEDICATED_PATCH | TRANSDERMAL | 0 refills | Status: DC

## 2017-08-18 NOTE — Progress Notes (Signed)
Patient here for follow up. Complains of increased shortness of breath with activity. Pt requesting refill for megace and fentanyl.

## 2017-08-25 ENCOUNTER — Other Ambulatory Visit (INDEPENDENT_AMBULATORY_CARE_PROVIDER_SITE_OTHER): Payer: Self-pay | Admitting: Internal Medicine

## 2017-08-25 ENCOUNTER — Inpatient Hospital Stay (HOSPITAL_BASED_OUTPATIENT_CLINIC_OR_DEPARTMENT_OTHER): Payer: Medicare Other | Admitting: Oncology

## 2017-08-25 ENCOUNTER — Telehealth: Payer: Self-pay | Admitting: *Deleted

## 2017-08-25 ENCOUNTER — Inpatient Hospital Stay: Payer: Medicare Other

## 2017-08-25 ENCOUNTER — Other Ambulatory Visit: Payer: Self-pay

## 2017-08-25 VITALS — BP 121/68 | HR 85 | Temp 96.1°F | Resp 18 | Wt 183.1 lb

## 2017-08-25 DIAGNOSIS — Z7982 Long term (current) use of aspirin: Secondary | ICD-10-CM

## 2017-08-25 DIAGNOSIS — M109 Gout, unspecified: Secondary | ICD-10-CM | POA: Diagnosis not present

## 2017-08-25 DIAGNOSIS — D696 Thrombocytopenia, unspecified: Secondary | ICD-10-CM | POA: Diagnosis not present

## 2017-08-25 DIAGNOSIS — G4730 Sleep apnea, unspecified: Secondary | ICD-10-CM

## 2017-08-25 DIAGNOSIS — D649 Anemia, unspecified: Secondary | ICD-10-CM

## 2017-08-25 DIAGNOSIS — K5090 Crohn's disease, unspecified, without complications: Secondary | ICD-10-CM | POA: Diagnosis not present

## 2017-08-25 DIAGNOSIS — E7800 Pure hypercholesterolemia, unspecified: Secondary | ICD-10-CM | POA: Diagnosis not present

## 2017-08-25 DIAGNOSIS — Z5111 Encounter for antineoplastic chemotherapy: Secondary | ICD-10-CM | POA: Diagnosis not present

## 2017-08-25 DIAGNOSIS — C23 Malignant neoplasm of gallbladder: Secondary | ICD-10-CM

## 2017-08-25 DIAGNOSIS — Z79899 Other long term (current) drug therapy: Secondary | ICD-10-CM | POA: Diagnosis not present

## 2017-08-25 DIAGNOSIS — R5383 Other fatigue: Secondary | ICD-10-CM | POA: Diagnosis not present

## 2017-08-25 DIAGNOSIS — E1165 Type 2 diabetes mellitus with hyperglycemia: Secondary | ICD-10-CM | POA: Diagnosis not present

## 2017-08-25 DIAGNOSIS — M129 Arthropathy, unspecified: Secondary | ICD-10-CM | POA: Diagnosis not present

## 2017-08-25 DIAGNOSIS — R531 Weakness: Secondary | ICD-10-CM

## 2017-08-25 DIAGNOSIS — K5000 Crohn's disease of small intestine without complications: Secondary | ICD-10-CM

## 2017-08-25 DIAGNOSIS — I10 Essential (primary) hypertension: Secondary | ICD-10-CM | POA: Diagnosis not present

## 2017-08-25 DIAGNOSIS — E860 Dehydration: Secondary | ICD-10-CM | POA: Diagnosis not present

## 2017-08-25 DIAGNOSIS — R0602 Shortness of breath: Secondary | ICD-10-CM | POA: Diagnosis not present

## 2017-08-25 DIAGNOSIS — K219 Gastro-esophageal reflux disease without esophagitis: Secondary | ICD-10-CM | POA: Diagnosis not present

## 2017-08-25 DIAGNOSIS — F418 Other specified anxiety disorders: Secondary | ICD-10-CM

## 2017-08-25 DIAGNOSIS — R748 Abnormal levels of other serum enzymes: Secondary | ICD-10-CM | POA: Diagnosis not present

## 2017-08-25 LAB — CBC WITH DIFFERENTIAL/PLATELET
Basophils Absolute: 0 10*3/uL (ref 0–0.1)
Basophils Relative: 1 %
Eosinophils Absolute: 0 10*3/uL (ref 0–0.7)
Eosinophils Relative: 1 %
HEMATOCRIT: 25 % — AB (ref 40.0–52.0)
HEMOGLOBIN: 8.6 g/dL — AB (ref 13.0–18.0)
LYMPHS ABS: 0.8 10*3/uL — AB (ref 1.0–3.6)
Lymphocytes Relative: 27 %
MCH: 34.9 pg — AB (ref 26.0–34.0)
MCHC: 34.6 g/dL (ref 32.0–36.0)
MCV: 100.9 fL — ABNORMAL HIGH (ref 80.0–100.0)
MONO ABS: 0.2 10*3/uL (ref 0.2–1.0)
MONOS PCT: 8 %
NEUTROS ABS: 1.9 10*3/uL (ref 1.4–6.5)
NEUTROS PCT: 63 %
Platelets: 79 10*3/uL — ABNORMAL LOW (ref 150–440)
RBC: 2.48 MIL/uL — ABNORMAL LOW (ref 4.40–5.90)
RDW: 18.9 % — AB (ref 11.5–14.5)
WBC: 2.9 10*3/uL — ABNORMAL LOW (ref 3.8–10.6)

## 2017-08-25 LAB — COMPREHENSIVE METABOLIC PANEL
ALK PHOS: 313 U/L — AB (ref 38–126)
ALT: 28 U/L (ref 0–44)
AST: 31 U/L (ref 15–41)
Albumin: 3.3 g/dL — ABNORMAL LOW (ref 3.5–5.0)
Anion gap: 9 (ref 5–15)
BILIRUBIN TOTAL: 0.3 mg/dL (ref 0.3–1.2)
BUN: 27 mg/dL — AB (ref 8–23)
CALCIUM: 8.8 mg/dL — AB (ref 8.9–10.3)
CO2: 17 mmol/L — ABNORMAL LOW (ref 22–32)
Chloride: 107 mmol/L (ref 98–111)
Creatinine, Ser: 0.9 mg/dL (ref 0.61–1.24)
GFR calc non Af Amer: >60 mL/min (ref >60)
Glucose, Bld: 143 mg/dL — ABNORMAL HIGH (ref 70–99)
Potassium: 4.1 mmol/L (ref 3.5–5.1)
Sodium: 133 mmol/L — ABNORMAL LOW (ref 135–145)
TOTAL PROTEIN: 7 g/dL (ref 6.5–8.1)

## 2017-08-25 LAB — SAMPLE TO BLOOD BANK

## 2017-08-25 MED ORDER — SODIUM CHLORIDE 0.9% FLUSH
10.0000 mL | Freq: Once | INTRAVENOUS | Status: AC
  Administered 2017-08-25: 10 mL via INTRAVENOUS
  Filled 2017-08-25: qty 10

## 2017-08-25 MED ORDER — HEPARIN SOD (PORK) LOCK FLUSH 100 UNIT/ML IV SOLN
500.0000 [IU] | Freq: Once | INTRAVENOUS | Status: AC
  Administered 2017-08-25: 500 [IU] via INTRAVENOUS

## 2017-08-25 NOTE — Progress Notes (Signed)
[place]  [phone] [phone]) [phone]  ID: Thomas David OB: 1935/10/30  MR#: [id number]  WCB#:[id number]  Patient Care Team: Leone Haven, MD as PCP - General (Family Medicine) Kathyrn Drown, MD (Family Medicine) Clent Jacks, RN as Registered Nurse  CHIEF COMPLAINT: Adenocarcinoma of gallbladder.  INTERVAL HISTORY: Patient returns to clinic today for further evaluation and consideration of cycle 5-day 15 of cisplatin and gemcitabine.  Gemcitabine only today.  He continues to experience weakness and fatigue after day 1 of each cycle otherwise is tolerating treatments well.  Has shortness of breath with exertion and when his hemoglobin is low.  Maintaining a good appetite.  He continues Megace as prescribed.  Pain is controlled.  He has no neurological complaints.  He denies any recent fevers, illnesses, chest pain, abdominal pain, nausea, vomiting.  Has history of Crohn's so continues to have loose stools intermittently.  REVIEW OF SYSTEMS:   Review of Systems  Constitutional: Positive for malaise/fatigue. Negative for chills, fever and weight loss.  HENT: Negative for congestion and ear pain.   Eyes: Negative.  Negative for blurred vision and double vision.  Respiratory: Positive for shortness of breath (Intermittent). Negative for cough and sputum production.   Cardiovascular: Negative.  Negative for chest pain, palpitations and leg swelling.  Gastrointestinal: Negative.  Negative for abdominal pain, constipation, diarrhea, nausea and vomiting.  Genitourinary: Negative for dysuria, frequency and urgency.  Musculoskeletal: Negative for back pain and falls.  Skin: Negative.  Negative for rash.  Neurological: Positive for weakness. Negative for headaches.  Endo/Heme/Allergies: Negative.  Does not bruise/bleed easily.  Psychiatric/Behavioral: Negative.  Negative for depression. The patient is not nervous/anxious and does not have insomnia.      As per HPI. Otherwise, a complete review of systems is negative.  PAST MEDICAL HISTORY: Past Medical History:  Diagnosis Date  . Anxiety   . Arthritis   . Chronic lower back pain   . Crohn's disease ([place])   . Depression   . Difficult intubation    patient denies on 02/12/17  . GERD (gastroesophageal reflux disease)   . Gout   . History of kidney stones 40 years ago  . Hypercholesteremia   . Hypertension   . Prediabetes   . Sleep apnea    cpap     PAST SURGICAL HISTORY: Past Surgical History:  Procedure Laterality Date  . CHOLECYSTECTOMY N/A 11/03/2016   Procedure: LAPAROSCOPIC CHOLECYSTostomy with tube placement ;  Surgeon: Johnathan Hausen, MD;  Location: [facility];  Service: General;  Laterality: N/A;  . CHOLECYSTECTOMY N/A 02/15/2017   Procedure: LAPAROSCOPIC SUBTOTAL CHOLECYSTECTOMY, BIOPSY OF GALLBLADDER;  Surgeon: Johnathan Hausen, MD;  Location: [facility];  Service: General;  Laterality: N/A;  . COLECTOMY  1973   "part of large intestines" (05/04/2012)  . COLON RESECTION    . COLONOSCOPY    . COLONOSCOPY N/A 05/23/2014   Procedure: COLONOSCOPY;  Surgeon: Rogene Houston, MD;  [facility]: [facility];  Service: Endoscopy;  Laterality: N/A;  1200  . ESOPHAGEAL DILATION N/A 05/23/2014   Procedure: ESOPHAGEAL DILATION;  Surgeon: Rogene Houston, MD;  [facility];  Service: Endoscopy;  Laterality: N/A;  . ESOPHAGOGASTRODUODENOSCOPY N/A 03/16/2014   Procedure: esophageal dilation ;  Surgeon: Rogene Houston, MD;  [facility];  Service: Endoscopy;  Laterality: N/A;  . ESOPHAGOGASTRODUODENOSCOPY N/A 05/23/2014   Procedure: ESOPHAGOGASTRODUODENOSCOPY (EGD);  Surgeon: Rogene Houston, MD;  [facility];  Service: Endoscopy;  Laterality: N/A;  . ESOPHAGOGASTRODUODENOSCOPY (EGD) WITH PROPOFOL N/A 02/18/2017   Procedure: ESOPHAGOGASTRODUODENOSCOPY (EGD) WITH PROPOFOL;  Surgeon: Wonda Horner, MD;  Location: [facility];  Service: Endoscopy;  Laterality:  N/A;  . [place]  . IR CHOLANGIOGRAM EXISTING TUBE  11/26/2016  . KIDNEY STONE SURGERY  1970's  . PARTIAL COLECTOMY  ~ 40 years ago   some of large and small  . PORTA CATH INSERTION N/A 03/17/2017   Procedure: PORTA CATH INSERTION;  Surgeon: Katha Cabal, MD;  Location: [facility];  Service: Cardiovascular;  Laterality: N/A;  . PROSTATE SURGERY    . THYROIDECTOMY Right 05/04/2012   Procedure: RIGHT HEMI THYROIDECTOMY;  Surgeon: Ascencion Dike, MD;  Location: [facility];  Service: ENT;  Laterality: Right;  . THYROIDECTOMY    . TONSILLECTOMY AND ADENOIDECTOMY     as a child  . TRANSURETHRAL PROSTATECTOMY WITH GYRUS INSTRUMENTS N/A 07/11/2012   Procedure: TRANSURETHRAL PROSTATECTOMY WITH GYRUS INSTRUMENTS;  Surgeon: Claybon Jabs, MD;  Location: [facility];  Service: Urology;  Laterality: N/A;  . UPPER GASTROINTESTINAL ENDOSCOPY      FAMILY HISTORY: Family History  Problem Relation Age of Onset  . Diabetes Mother   . Stroke Father   . Healthy Daughter   . Healthy Son   . Heart disease Sister     ADVANCED DIRECTIVES (Y/N):  N  HEALTH MAINTENANCE: Social History   Tobacco Use  . Smoking status: Never Smoker  . Smokeless tobacco: Never Used  Substance Use Topics  . Alcohol use: No    Alcohol/week: 0.0 oz  . Drug use: No     Colonoscopy:  PAP:  Bone density:  Lipid panel:  No Known Allergies  Current Outpatient Medications  Medication Sig Dispense Refill  . allopurinol (ZYLOPRIM) 300 MG tablet Take 1 tablet (300 mg total) by mouth every morning. 90 tablet 3  . amLODipine (NORVASC) 5 MG tablet TAKE 1 TABLET BY MOUTH  DAILY 90 tablet 3  . aspirin EC 81 MG tablet Take 81 mg by mouth at bedtime.    . fentaNYL (DURAGESIC - DOSED MCG/HR) 25 MCG/HR patch Place 1 patch (25 mcg total) onto the skin every 3 (three) days. 10 patch 0  . lactulose (CHRONULAC) 10 GM/15ML solution Take 15 mLs (10 g total) by mouth daily. 240 mL 1  . lidocaine-prilocaine (EMLA) cream  Apply to port 1 hours prior to chemotherapy appointment. Cover with plastic wrap. 30 g 3  . losartan (COZAAR) 100 MG tablet Take 1 tablet (100 mg total) by mouth daily. 90 tablet 3  . megestrol (MEGACE) 40 MG tablet Take 1 tablet (40 mg total) by mouth daily. 30 tablet 2  . mesalamine (PENTASA) 500 MG CR capsule Take 1 capsule (500 mg total) by mouth 4 (four) times daily. (Patient taking differently: Take 1,000 mg by mouth 2 (two) times daily. ) 360 capsule 3  . metoprolol tartrate (LOPRESSOR) 50 MG tablet Take 1 tablet (50 mg total) by mouth 2 (two) times daily. 180 tablet 3  . Multiple Vitamin (MULTIVITAMIN WITH MINERALS) TABS tablet Take 2 tablets by mouth daily.    Marland Kitchen oxybutynin (DITROPAN-XL) 5 MG 24 hr tablet Take 1 tablet (5 mg total) by mouth at bedtime. 90 tablet 1  . pantoprazole (PROTONIX) 40 MG tablet Take 1 tablet (40 mg total) by mouth daily. 90 tablet 1  . simvastatin (ZOCOR) 20 MG tablet Take 1 tablet (20 mg total) by mouth daily. 90 tablet 3  .  vitamin B-12 (CYANOCOBALAMIN) 1000 MCG tablet Take 1,000 mcg by mouth 2 (two) times daily.    Marland Kitchen acetaminophen (TYLENOL) 500 MG tablet Take 1,000 mg by mouth every 4 (four) hours as needed for moderate pain or headache.     . bisacodyl (DULCOLAX) 10 MG suppository Place 1 suppository (10 mg total) rectally daily as needed for moderate constipation. (Patient not taking: Reported on 08/25/2017) 12 suppository 0  . fluticasone (FLONASE) 50 MCG/ACT nasal spray USE 1 SPRAY INTO BOTH  NOSTRILS DAILY (Patient not taking: Reported on 08/25/2017) 32 g 0  . Histamine Dihydrochloride (AUSTRALIAN DREAM ARTHRITIS EX) Apply 1 application topically 2 (two) times daily as needed (back pain).     Marland Kitchen HYDROcodone-acetaminophen (NORCO/VICODIN) 5-325 MG tablet Take 1-2 tablets by mouth every 4 (four) hours as needed for moderate pain. (Patient not taking: Reported on 08/25/2017) 30 tablet 0  . ondansetron (ZOFRAN) 8 MG tablet Take 1 tablet (8 mg total) by mouth 2 (two)  times daily. (Patient not taking: Reported on 08/25/2017) 30 tablet 3  . Simethicone 180 MG CAPS Take 1 capsule (180 mg total) by mouth 3 (three) times daily as needed. (Patient not taking: Reported on 08/25/2017)  0   No current facility-administered medications for this visit.    Facility-Administered Medications Ordered in Other Visits  Medication Dose Route Frequency Provider Last Rate Last Dose  . heparin lock flush 100 unit/mL  500 Units Intravenous Once Faythe Casa E, NP      . heparin lock flush 100 unit/mL  500 Units Intravenous Once Lloyd Huger, MD      . sodium chloride flush (NS) 0.9 % injection 10 mL  10 mL Intravenous PRN Jacquelin Hawking, NP   10 mL at 07/12/17 1445    OBJECTIVE: Vitals:   08/25/17 1006  BP: 121/68  Pulse: 85  Resp: 18  Temp: (!) 96.1 F (35.6 C)     Body mass index is 27.04 kg/m.    ECOG FS:1 - Symptomatic but completely ambulatory  Physical Exam  Constitutional: He is oriented to person, place, and time and well-developed, well-nourished, and in no distress. Vital signs are normal.  HENT:  Head: Normocephalic and atraumatic.  Eyes: Pupils are equal, round, and reactive to light.  Neck: Normal range of motion.  Cardiovascular: Normal rate, regular rhythm and normal heart sounds.  No murmur heard. Pulmonary/Chest: Effort normal and breath sounds normal. He has no wheezes.  Abdominal: Soft. Normal appearance and bowel sounds are normal. He exhibits no distension. There is no tenderness.  Musculoskeletal: Normal range of motion. He exhibits no edema.  Neurological: He is alert and oriented to person, place, and time. Gait normal.  Skin: Skin is warm and dry. No rash noted.  Psychiatric: Mood, memory, affect and judgment normal.    LAB RESULTS:  Lab Results  Component Value Date   NA 135 08/18/2017   K 4.1 08/18/2017   CL 107 08/18/2017   CO2 18 (L) 08/18/2017   GLUCOSE 157 (H) 08/18/2017   BUN 31 (H) 08/18/2017   CREATININE  1.30 (H) 08/18/2017   CALCIUM 8.8 (L) 08/18/2017   PROT 6.9 08/18/2017   ALBUMIN 3.4 (L) 08/18/2017   AST 37 08/18/2017   ALT 38 08/18/2017   ALKPHOS 304 (H) 08/18/2017   BILITOT 0.5 08/18/2017   GFRNONAA 51 (L) 08/18/2017   GFRAA 59 (L) 08/18/2017    Lab Results  Component Value Date   WBC 2.9 (L) 08/25/2017   NEUTROABS 1.9  08/25/2017   HGB 8.6 (L) 08/25/2017   HCT 25.0 (L) 08/25/2017   MCV 100.9 (H) 08/25/2017   PLT 79 (L) 08/25/2017     STUDIES: No results found.  ASSESSMENT: Adenocarcinoma of gallbladder.  PLAN:    1. Adenocarcinoma of gallbladder: Restaging PET results from July 06, 2017 reviewed independently with improvement of disease burden.  Patient still has residual disease, therefore we will continue treatment as planned.  Both gemcitabine and cisplatin have been dose reduced.  He continues to receive cisplatin and gemcitabine on day 1 and then gemcitabine only on days 8 and day 15.  This is a 28-day cycle.  Cycle 4, day 15 of treatment was skipped secondary to decreased performance status and pancytopenia.  Hold treatment today due to thrombocytopenia.  Platelet count is 79,000.  Spoke with [name] and will skip the cycle altogether.  He will return to clinic on 09/08/2017 for consideration of cycle 6 of gemcitabine and cisplatin. 2.  Poor appetite: Improving. Continue Megace.  3.  Anemia/SOB: Hemoglobin 8.6 today.  Will potentially require additional blood transfusions.  Have him return in 1 week for repeat lab draw and possible blood. 4.  Thrombocytopenia: Platelet count 79,000 today.  Treatment held as above.  Previously cisplatin and gemcitabine were dose reduced due to thrombocytopenia.  Greater than 50% was spent in counseling and coordination of care with this patient including but not limited to discussion of the relevant topics above (See A&P) including, but not limited to diagnosis and management of acute and chronic medical conditions.   Patient  expressed understanding and was in agreement with this plan. He also understands that He can call clinic at any time with any questions, concerns, or complaints.   Cancer Staging Adenocarcinoma of gallbladder [facility]) Staging form: Gallbladder, AJCC 8th Edition - Clinical stage from 03/03/2017: Stage IVB (cT3, cN0, cM1) - Signed by Lloyd Huger, MD on 03/23/2017   Jacquelin Hawking, NP   08/25/2017 10:21 AM

## 2017-08-25 NOTE — Progress Notes (Signed)
Here for follow up, stated w " light treatment " feeling well.  Overall doing well pain controlled w Fentynl patch .

## 2017-08-25 NOTE — Progress Notes (Signed)
No treatment today per Rulon Abide NP. Pt stable at discharge.

## 2017-08-25 NOTE — Telephone Encounter (Signed)
Patient called upset that he has to wait an entire week before coming back to be seen and feels he needs to be seen again this week. Per Lorretta Harp, NP and Dr Grayland Ormond, patient agrees to an appointment for Friday for NP and IV fluids

## 2017-08-26 ENCOUNTER — Telehealth (INDEPENDENT_AMBULATORY_CARE_PROVIDER_SITE_OTHER): Payer: Self-pay | Admitting: Internal Medicine

## 2017-08-26 DIAGNOSIS — K5010 Crohn's disease of large intestine without complications: Secondary | ICD-10-CM

## 2017-08-26 MED ORDER — MESALAMINE ER 500 MG PO CPCR: 1000.0000 mg | ORAL_CAPSULE | Freq: Two times a day (BID) | ORAL | 3 refills | Status: AC

## 2017-08-26 NOTE — Telephone Encounter (Signed)
Patient asked if you could call in his Pentasa refill

## 2017-08-26 NOTE — Telephone Encounter (Signed)
Rx sent to his pharmacy

## 2017-08-26 NOTE — Telephone Encounter (Signed)
Addressed.

## 2017-08-26 NOTE — Telephone Encounter (Signed)
Patient is wanting you to call pharmacy on his pentasa - please give patient a call

## 2017-08-27 ENCOUNTER — Inpatient Hospital Stay (HOSPITAL_BASED_OUTPATIENT_CLINIC_OR_DEPARTMENT_OTHER): Payer: Medicare Other | Admitting: Oncology

## 2017-08-27 ENCOUNTER — Inpatient Hospital Stay: Payer: Medicare Other

## 2017-08-27 ENCOUNTER — Other Ambulatory Visit: Payer: Self-pay | Admitting: *Deleted

## 2017-08-27 ENCOUNTER — Other Ambulatory Visit: Payer: Self-pay

## 2017-08-27 VITALS — BP 115/62 | HR 89 | Temp 98.4°F | Resp 20 | Ht 69.0 in | Wt 180.0 lb

## 2017-08-27 DIAGNOSIS — Z7982 Long term (current) use of aspirin: Secondary | ICD-10-CM | POA: Diagnosis not present

## 2017-08-27 DIAGNOSIS — Z5111 Encounter for antineoplastic chemotherapy: Secondary | ICD-10-CM

## 2017-08-27 DIAGNOSIS — E1165 Type 2 diabetes mellitus with hyperglycemia: Secondary | ICD-10-CM | POA: Diagnosis not present

## 2017-08-27 DIAGNOSIS — K219 Gastro-esophageal reflux disease without esophagitis: Secondary | ICD-10-CM | POA: Diagnosis not present

## 2017-08-27 DIAGNOSIS — D696 Thrombocytopenia, unspecified: Secondary | ICD-10-CM | POA: Diagnosis not present

## 2017-08-27 DIAGNOSIS — Z79899 Other long term (current) drug therapy: Secondary | ICD-10-CM | POA: Diagnosis not present

## 2017-08-27 DIAGNOSIS — M109 Gout, unspecified: Secondary | ICD-10-CM

## 2017-08-27 DIAGNOSIS — C23 Malignant neoplasm of gallbladder: Secondary | ICD-10-CM | POA: Diagnosis not present

## 2017-08-27 DIAGNOSIS — G4730 Sleep apnea, unspecified: Secondary | ICD-10-CM

## 2017-08-27 DIAGNOSIS — R531 Weakness: Secondary | ICD-10-CM

## 2017-08-27 DIAGNOSIS — E860 Dehydration: Secondary | ICD-10-CM

## 2017-08-27 DIAGNOSIS — D649 Anemia, unspecified: Secondary | ICD-10-CM

## 2017-08-27 DIAGNOSIS — Z95828 Presence of other vascular implants and grafts: Secondary | ICD-10-CM

## 2017-08-27 DIAGNOSIS — I10 Essential (primary) hypertension: Secondary | ICD-10-CM | POA: Diagnosis not present

## 2017-08-27 DIAGNOSIS — K5090 Crohn's disease, unspecified, without complications: Secondary | ICD-10-CM

## 2017-08-27 DIAGNOSIS — R5383 Other fatigue: Secondary | ICD-10-CM | POA: Diagnosis not present

## 2017-08-27 DIAGNOSIS — F418 Other specified anxiety disorders: Secondary | ICD-10-CM

## 2017-08-27 DIAGNOSIS — E7800 Pure hypercholesterolemia, unspecified: Secondary | ICD-10-CM | POA: Diagnosis not present

## 2017-08-27 DIAGNOSIS — M129 Arthropathy, unspecified: Secondary | ICD-10-CM | POA: Diagnosis not present

## 2017-08-27 DIAGNOSIS — R0602 Shortness of breath: Secondary | ICD-10-CM | POA: Diagnosis not present

## 2017-08-27 DIAGNOSIS — R748 Abnormal levels of other serum enzymes: Secondary | ICD-10-CM | POA: Diagnosis not present

## 2017-08-27 LAB — CBC WITH DIFFERENTIAL/PLATELET
BASOS ABS: 0 10*3/uL (ref 0–0.1)
BASOS PCT: 0 %
EOS ABS: 0 10*3/uL (ref 0–0.7)
EOS PCT: 1 %
HCT: 24.1 % — ABNORMAL LOW (ref 40.0–52.0)
Hemoglobin: 8.3 g/dL — ABNORMAL LOW (ref 13.0–18.0)
Lymphocytes Relative: 21 %
Lymphs Abs: 0.7 10*3/uL — ABNORMAL LOW (ref 1.0–3.6)
MCH: 35.1 pg — ABNORMAL HIGH (ref 26.0–34.0)
MCHC: 34.6 g/dL (ref 32.0–36.0)
MCV: 101.5 fL — ABNORMAL HIGH (ref 80.0–100.0)
Monocytes Absolute: 0.4 10*3/uL (ref 0.2–1.0)
Monocytes Relative: 14 %
NEUTROS PCT: 64 %
Neutro Abs: 2 10*3/uL (ref 1.4–6.5)
PLATELETS: 77 10*3/uL — AB (ref 150–440)
RBC: 2.37 MIL/uL — ABNORMAL LOW (ref 4.40–5.90)
RDW: 18.4 % — ABNORMAL HIGH (ref 11.5–14.5)
WBC: 3.1 10*3/uL — AB (ref 3.8–10.6)

## 2017-08-27 LAB — COMPREHENSIVE METABOLIC PANEL
ALBUMIN: 3.2 g/dL — AB (ref 3.5–5.0)
ALT: 26 U/L (ref 0–44)
AST: 29 U/L (ref 15–41)
Alkaline Phosphatase: 304 U/L — ABNORMAL HIGH (ref 38–126)
Anion gap: 9 (ref 5–15)
BUN: 29 mg/dL — ABNORMAL HIGH (ref 8–23)
CHLORIDE: 110 mmol/L (ref 98–111)
CO2: 19 mmol/L — AB (ref 22–32)
CREATININE: 1.02 mg/dL (ref 0.61–1.24)
Calcium: 8.9 mg/dL (ref 8.9–10.3)
GFR calc non Af Amer: >60 mL/min (ref >60)
GLUCOSE: 140 mg/dL — AB (ref 70–99)
Potassium: 3.8 mmol/L (ref 3.5–5.1)
SODIUM: 138 mmol/L (ref 135–145)
Total Bilirubin: 0.3 mg/dL (ref 0.3–1.2)
Total Protein: 6.8 g/dL (ref 6.5–8.1)

## 2017-08-27 LAB — SAMPLE TO BLOOD BANK

## 2017-08-27 MED ORDER — SODIUM CHLORIDE 0.9% FLUSH
10.0000 mL | Freq: Once | INTRAVENOUS | Status: AC
  Administered 2017-08-27: 10 mL via INTRAVENOUS
  Filled 2017-08-27: qty 10

## 2017-08-27 MED ORDER — DEXAMETHASONE SODIUM PHOSPHATE 100 MG/10ML IJ SOLN: Freq: Once | INTRAMUSCULAR | Status: DC

## 2017-08-27 MED ORDER — HEPARIN SOD (PORK) LOCK FLUSH 100 UNIT/ML IV SOLN
500.0000 [IU] | Freq: Once | INTRAVENOUS | Status: AC
  Administered 2017-08-27: 500 [IU]
  Filled 2017-08-27: qty 5

## 2017-08-27 MED ORDER — DEXAMETHASONE SODIUM PHOSPHATE 10 MG/ML IJ SOLN
10.0000 mg | Freq: Once | INTRAMUSCULAR | Status: AC
  Administered 2017-08-27: 10 mg via INTRAVENOUS
  Filled 2017-08-27: qty 1

## 2017-08-27 MED ORDER — SODIUM CHLORIDE 0.9 % IV SOLN
INTRAVENOUS | Status: DC
  Administered 2017-08-27: 10:00:00 via INTRAVENOUS
  Filled 2017-08-27 (×2): qty 1000

## 2017-08-27 NOTE — Patient Instructions (Signed)
Keep your scheduled appointments and contact our office on Monday if you feel like you need fluids.

## 2017-08-27 NOTE — Progress Notes (Signed)
Symptom Management Consult note [facility]  Telephone:([phone] [phone]  Patient Care Team: Leone Haven, MD as PCP - General (Family Medicine) Kathyrn Drown, MD (Family Medicine) Clent Jacks, RN as Registered Nurse   Name of the patient: Thomas David  [id number]  February 23, 1935   Date of visit: 08/27/17  Diagnosis-adenocarcinoma of gallbladder  Chief complaint/ Reason for visit-anemia/shortness of breath  Heme/Onc history: Patient was last seen by me on 08/25/2017 for consideration of cycle 5-day 15 of cisplatin and gemcitabine. It was held d/t thrombocytopenia.  Additionally he complained of intermittent shortness of breath thought to be from anemia.  Hemoglobin was 8.6.  He was set up for repeat lab draw in 1 week with the possibility for a blood transfusion.   Oncology History   Patient with initial diagnosis in January 2019 where he was admitted to the hospital for intermittent abdominal pain for several months.  This was thought to be related to his gallbladder he had surgery in October which was unsuccessful secondary to significant inflammation.  A drain was placed.  Had a cholecystectomy in December 2018 which revealed adenocarcinoma.  Had positive margins with disease on serosal surface.  Pet scans from March 19, 2017 confirming stage IV disease with omental and liver metastasis.  Plan is to continue palliative chemotherapy using gemcitabine and cisplatin Day 1 and gemcitabine only on days 8 and 15.  Cisplatin has been dose reduced due to difficulties during cycle 1.  He is scheduled for restaging PET/CT on July 06, 2017.  Pet Scan from 07/06/17 revealed improvement of hepatic metastasis decrease in metabolic activity of right adrenal gland metastasis and resolution of metabolic activity and left adrenal gland metastasis mild decrease in metabolic activity of omental metastasis disease.  Several new small pulmonary nodules in  left lower lobe hypermetabolic.     Adenocarcinoma of gallbladder ([place])   02/19/2017 Initial Diagnosis    Adenocarcinoma of gallbladder ([facility])      Interval history-Patient returns to clinic today for continued intermittent shortness of breath and weakness.  States this developed over the course of the past week.  Last treatment was approximately 10 days ago.  States all he has been doing is Optometrist around".  Admits to sleeping a lot.  Has intermittent episodes lasting approximately 30 seconds of shortness of breath.  It is exacerbated with activity.  Relieved with rest and PRBCs.  Has had several units packed red blood cells in the past and is interested in his hemoglobin level.   ECOG FS:1 - Symptomatic but completely ambulatory  Review of systems- Review of Systems  Constitutional: Positive for malaise/fatigue. Negative for chills, fever and weight loss.  HENT: Negative for congestion and ear pain.   Eyes: Negative.  Negative for blurred vision and double vision.  Respiratory: Positive for shortness of breath. Negative for cough and sputum production.   Cardiovascular: Negative.  Negative for chest pain, palpitations and leg swelling.  Gastrointestinal: Negative.  Negative for abdominal pain, constipation, diarrhea, nausea and vomiting.  Genitourinary: Negative for dysuria, frequency and urgency.  Musculoskeletal: Negative for back pain and falls.  Skin: Negative.  Negative for rash.  Neurological: Positive for weakness. Negative for headaches.  Endo/Heme/Allergies: Negative.  Does not bruise/bleed easily.  Psychiatric/Behavioral: Negative.  Negative for depression. The patient is not nervous/anxious and does not have insomnia.      Current treatment- s/p 5 cycles of cisplatin and gemcitabine.  Last given cycle 5-day 8 of  gemcitabine only.  Cycle 5-day 15 held due to thrombocytopenia (08/22/17)  No Known Allergies   Past Medical History:  Diagnosis Date  . Anxiety   . Arthritis     . Chronic lower back pain   . Crohn's disease ([place])   . Depression   . Difficult intubation    patient denies on 02/12/17  . GERD (gastroesophageal reflux disease)   . Gout   . History of kidney stones 40 years ago  . Hypercholesteremia   . Hypertension   . Prediabetes   . Sleep apnea    cpap      Past Surgical History:  Procedure Laterality Date  . CHOLECYSTECTOMY N/A 11/03/2016   Procedure: LAPAROSCOPIC CHOLECYSTostomy with tube placement ;  Surgeon: Johnathan Hausen, MD;  Location: [facility];  Service: General;  Laterality: N/A;  . CHOLECYSTECTOMY N/A 02/15/2017   Procedure: LAPAROSCOPIC SUBTOTAL CHOLECYSTECTOMY, BIOPSY OF GALLBLADDER;  Surgeon: Johnathan Hausen, MD;  Location: [facility];  Service: General;  Laterality: N/A;  . COLECTOMY  1973   "part of large intestines" (05/04/2012)  . COLON RESECTION    . COLONOSCOPY    . COLONOSCOPY N/A 05/23/2014   Procedure: COLONOSCOPY;  Surgeon: Rogene Houston, MD;  [facility];  Service: Endoscopy;  Laterality: N/A;  1200  . ESOPHAGEAL DILATION N/A 05/23/2014   Procedure: ESOPHAGEAL DILATION;  Surgeon: Rogene Houston, MD;  [facility];  Service: Endoscopy;  Laterality: N/A;  . ESOPHAGOGASTRODUODENOSCOPY N/A 03/16/2014   Procedure: esophageal dilation ;  Surgeon: Rogene Houston, MD;  [facility];  Service: Endoscopy;  Laterality: N/A;  . ESOPHAGOGASTRODUODENOSCOPY N/A 05/23/2014   Procedure: ESOPHAGOGASTRODUODENOSCOPY (EGD);  Surgeon: Rogene Houston, MD;  [facility];  Service: Endoscopy;  Laterality: N/A;  . ESOPHAGOGASTRODUODENOSCOPY (EGD) WITH PROPOFOL N/A 02/18/2017   Procedure: ESOPHAGOGASTRODUODENOSCOPY (EGD) WITH PROPOFOL;  Surgeon: Wonda Horner, MD;  Location: [facility];  Service: Endoscopy;  Laterality: N/A;  . [place]  . IR CHOLANGIOGRAM EXISTING TUBE  11/26/2016  . KIDNEY STONE SURGERY  1970's  . PARTIAL COLECTOMY  ~ 40 years ago   some of large and small  .  PORTA CATH INSERTION N/A 03/17/2017   Procedure: PORTA CATH INSERTION;  Surgeon: Katha Cabal, MD;  Location: [place] [facility];  Service: Cardiovascular;  Laterality: N/A;  . PROSTATE SURGERY    . THYROIDECTOMY Right 05/04/2012   Procedure: RIGHT HEMI THYROIDECTOMY;  Surgeon: Ascencion Dike, MD;  Location: [facility];  Service: ENT;  Laterality: Right;  . THYROIDECTOMY    . TONSILLECTOMY AND ADENOIDECTOMY     as a child  . TRANSURETHRAL PROSTATECTOMY WITH GYRUS INSTRUMENTS N/A 07/11/2012   Procedure: TRANSURETHRAL PROSTATECTOMY WITH GYRUS INSTRUMENTS;  Surgeon: Claybon Jabs, MD;  Location: [facility];  Service: Urology;  Laterality: N/A;  . UPPER GASTROINTESTINAL ENDOSCOPY      Social History   Socioeconomic History  . Marital status: Widowed    Spouse name: Not on file  . Number of children: Not on file  . Years of education: Not on file  . Highest education level: Not on file  Occupational History  . Not on file  Social Needs  . Financial resource strain: Not on file  . Food insecurity:    Worry: Not on file    Inability: Not on file  . Transportation needs:    Medical: Not on file    Non-medical: Not on file  Tobacco Use  .  Smoking status: Never Smoker  . Smokeless tobacco: Never Used  Substance and Sexual Activity  . Alcohol use: No    Alcohol/week: 0.0 oz  . Drug use: No  . Sexual activity: Never  Lifestyle  . Physical activity:    Days per week: Not on file    Minutes per session: Not on file  . Stress: Not on file  Relationships  . Social connections:    Talks on phone: Not on file    Gets together: Not on file    Attends religious service: Not on file    Active member of club or organization: Not on file    Attends meetings of clubs or organizations: Not on file    Relationship status: Not on file  . Intimate partner violence:    Fear of current or ex partner: Not on file    Emotionally abused: Not on file    Physically abused: Not on file    Forced sexual  activity: Not on file  Other Topics Concern  . Not on file  Social History Narrative   ** Merged History Encounter **        Family History  Problem Relation Age of Onset  . Diabetes Mother   . Stroke Father   . Healthy Daughter   . Healthy Son   . Heart disease Sister      Current Outpatient Medications:  .  allopurinol (ZYLOPRIM) 300 MG tablet, Take 1 tablet (300 mg total) by mouth every morning., Disp: 90 tablet, Rfl: 3 .  amLODipine (NORVASC) 5 MG tablet, TAKE 1 TABLET BY MOUTH  DAILY, Disp: 90 tablet, Rfl: 3 .  aspirin EC 81 MG tablet, Take 81 mg by mouth at bedtime., Disp: , Rfl:  .  fentaNYL (DURAGESIC - DOSED MCG/HR) 25 MCG/HR patch, Place 1 patch (25 mcg total) onto the skin every 3 (three) days., Disp: 10 patch, Rfl: 0 .  lidocaine-prilocaine (EMLA) cream, Apply to port 1 hours prior to chemotherapy appointment. Cover with plastic wrap., Disp: 30 g, Rfl: 3 .  losartan (COZAAR) 100 MG tablet, Take 1 tablet (100 mg total) by mouth daily., Disp: 90 tablet, Rfl: 3 .  megestrol (MEGACE) 40 MG tablet, Take 1 tablet (40 mg total) by mouth daily., Disp: 30 tablet, Rfl: 2 .  mesalamine (PENTASA) 500 MG CR capsule, Take 2 capsules (1,000 mg total) by mouth 2 (two) times daily., Disp: 360 capsule, Rfl: 3 .  metoprolol tartrate (LOPRESSOR) 50 MG tablet, Take 1 tablet (50 mg total) by mouth 2 (two) times daily., Disp: 180 tablet, Rfl: 3 .  Multiple Vitamin (MULTIVITAMIN WITH MINERALS) TABS tablet, Take 2 tablets by mouth daily., Disp: , Rfl:  .  oxybutynin (DITROPAN-XL) 5 MG 24 hr tablet, Take 1 tablet (5 mg total) by mouth at bedtime., Disp: 90 tablet, Rfl: 1 .  simvastatin (ZOCOR) 20 MG tablet, Take 1 tablet (20 mg total) by mouth daily., Disp: 90 tablet, Rfl: 3 .  vitamin B-12 (CYANOCOBALAMIN) 1000 MCG tablet, Take 1,000 mcg by mouth 2 (two) times daily., Disp: , Rfl:  .  acetaminophen (TYLENOL) 500 MG tablet, Take 1,000 mg by mouth every 4 (four) hours as needed for moderate pain or  headache. , Disp: , Rfl:  .  bisacodyl (DULCOLAX) 10 MG suppository, Place 1 suppository (10 mg total) rectally daily as needed for moderate constipation. (Patient not taking: Reported on 08/25/2017), Disp: 12 suppository, Rfl: 0 .  fluticasone (FLONASE) 50 MCG/ACT nasal spray, USE 1 SPRAY  INTO BOTH  NOSTRILS DAILY (Patient not taking: Reported on 08/25/2017), Disp: 32 g, Rfl: 0 .  Histamine Dihydrochloride (AUSTRALIAN DREAM ARTHRITIS EX), Apply 1 application topically 2 (two) times daily as needed (back pain). , Disp: , Rfl:  .  HYDROcodone-acetaminophen (NORCO/VICODIN) 5-325 MG tablet, Take 1-2 tablets by mouth every 4 (four) hours as needed for moderate pain. (Patient not taking: Reported on 08/25/2017), Disp: 30 tablet, Rfl: 0 .  lactulose (CHRONULAC) 10 GM/15ML solution, Take 15 mLs (10 g total) by mouth daily. (Patient not taking: Reported on 08/27/2017), Disp: 240 mL, Rfl: 1 .  ondansetron (ZOFRAN) 8 MG tablet, Take 1 tablet (8 mg total) by mouth 2 (two) times daily. (Patient not taking: Reported on 08/25/2017), Disp: 30 tablet, Rfl: 3 .  pantoprazole (PROTONIX) 40 MG tablet, Take 1 tablet (40 mg total) by mouth daily. (Patient not taking: Reported on 08/27/2017), Disp: 90 tablet, Rfl: 1 .  Simethicone 180 MG CAPS, Take 1 capsule (180 mg total) by mouth 3 (three) times daily as needed. (Patient not taking: Reported on 08/25/2017), Disp: , Rfl: 0 No current facility-administered medications for this visit.   Facility-Administered Medications Ordered in Other Visits:  .  0.9 %  sodium chloride infusion, , Intravenous, Continuous, Burns, Jennifer E, NP, Stopped at 08/27/17 1052 .  heparin lock flush 100 unit/mL, 500 Units, Intravenous, Once, Burns, Jennifer E, NP .  sodium chloride flush (NS) 0.9 % injection 10 mL, 10 mL, Intravenous, PRN, Jacquelin Hawking, NP, 10 mL at 07/12/17 1445  Physical exam:  Vitals:   08/27/17 0845 08/27/17 0847  BP: 115/62   Pulse: 89   Resp: 20   Temp: 98.4 F (36.9  C)   TempSrc: Oral   Weight:  180 lb (81.6 kg)  Height:  5' 9"  (1.753 m)   Physical Exam  Constitutional: He is oriented to person, place, and time. Vital signs are normal. He appears well-developed and well-nourished.  HENT:  Head: Normocephalic and atraumatic.  Eyes: Pupils are equal, round, and reactive to light.  Neck: Normal range of motion.  Cardiovascular: Normal rate, regular rhythm and normal heart sounds.  No murmur heard. Pulmonary/Chest: Effort normal and breath sounds normal. He has no wheezes.  Abdominal: Soft. Normal appearance and bowel sounds are normal. He exhibits no distension. There is no tenderness.  Musculoskeletal: Normal range of motion. He exhibits no edema.  Neurological: He is alert and oriented to person, place, and time.  Skin: Skin is warm and dry. No rash noted. There is pallor.  Psychiatric: Judgment normal.     CMP Latest Ref Rng & Units 08/27/2017  Glucose 70 - 99 mg/dL 140(H)  BUN 8 - 23 mg/dL 29(H)  Creatinine 0.61 - 1.24 mg/dL 1.02  Sodium 135 - 145 mmol/L 138  Potassium 3.5 - 5.1 mmol/L 3.8  Chloride 98 - 111 mmol/L 110  CO2 22 - 32 mmol/L 19(L)  Calcium 8.9 - 10.3 mg/dL 8.9  Total Protein 6.5 - 8.1 g/dL 6.8  Total Bilirubin 0.3 - 1.2 mg/dL 0.3  Alkaline Phos 38 - 126 U/L 304(H)  AST 15 - 41 U/L 29  ALT 0 - 44 U/L 26   CBC Latest Ref Rng & Units 08/27/2017  WBC 3.8 - 10.6 K/uL 3.1(L)  Hemoglobin 13.0 - 18.0 g/dL 8.3(L)  Hematocrit 40.0 - 52.0 % 24.1(L)  Platelets 150 - 440 K/uL 77(L)    No images are attached to the encounter.  No results found.   Assessment and plan- Patient  is a 82 y.o. male who presents for weakness and shortness of breath.  1.  Gallbladder cancer: Status post cycle 5-day 8 on 08/16/2017 (gemcitabine only).  Cycle 5-day 15 was held due to thrombocytopenia.  After cycle 1 he was dose reduced due to declining performance status and intermittent confusion.  Restaging scan on 07/06/2017 revealed decreased hepatic  metastasis, decrease in large right adrenal gland and near complete resolution of left adrenal gland.  New lung nodules were noted but not hypermetabolic.  He is scheduled to return to clinic on 09/08/2017 for cycle 6 of gemcitabine and cisplatin.   2.  Anemia: Continues to trend down post chemotherapy.  Last unit of PRBCs was given on 07/21/2017.  He likely will require additional units of blood.  We will tentatively have him scheduled to return to clinic on 09/01/2017 for 1 unit PRBC.  Hemoglobin today is 8.3.  Patient is symptomatic with shortness of breath, pallor and weakness.  We will give patient 1 L NaCl today and 10 mg Decadron.  He knows to call clinic  with any questions.  3. Thrombocytopenia: Platelet count 77,000 today.  Educated him on bleeding precautions.  4.  Dehydration: BUN slightly elevated at 29 today.  Creatinine stable.  He will receive 1 L NaCl as mentioned above.  Encouraged to drink plenty of fluids.  Visit Diagnosis 1. Anemia, unspecified type   2. Adenocarcinoma of gallbladder [facility])     Patient expressed understanding and was in agreement with this plan. He also understands that He can call clinic at any time with any questions, concerns, or complaints.   Greater than 50% was spent in counseling and coordination of care with this patient including but not limited to discussion of the relevant topics above (See A&P) including, but not limited to diagnosis and management of acute and chronic medical conditions.    Faythe Casa, AGNP-C [facility] [place]- [phone] Pager- [phone] 08/27/2017 1:25 PM

## 2017-09-01 ENCOUNTER — Inpatient Hospital Stay: Payer: Medicare Other | Admitting: Oncology

## 2017-09-01 ENCOUNTER — Inpatient Hospital Stay: Payer: Medicare Other

## 2017-09-01 DIAGNOSIS — D649 Anemia, unspecified: Secondary | ICD-10-CM | POA: Diagnosis not present

## 2017-09-01 DIAGNOSIS — K219 Gastro-esophageal reflux disease without esophagitis: Secondary | ICD-10-CM | POA: Diagnosis not present

## 2017-09-01 DIAGNOSIS — Z5111 Encounter for antineoplastic chemotherapy: Secondary | ICD-10-CM | POA: Diagnosis not present

## 2017-09-01 DIAGNOSIS — M109 Gout, unspecified: Secondary | ICD-10-CM | POA: Diagnosis not present

## 2017-09-01 DIAGNOSIS — I10 Essential (primary) hypertension: Secondary | ICD-10-CM | POA: Diagnosis not present

## 2017-09-01 DIAGNOSIS — R5383 Other fatigue: Secondary | ICD-10-CM | POA: Diagnosis not present

## 2017-09-01 DIAGNOSIS — G4730 Sleep apnea, unspecified: Secondary | ICD-10-CM | POA: Diagnosis not present

## 2017-09-01 DIAGNOSIS — E7800 Pure hypercholesterolemia, unspecified: Secondary | ICD-10-CM | POA: Diagnosis not present

## 2017-09-01 DIAGNOSIS — Z79899 Other long term (current) drug therapy: Secondary | ICD-10-CM | POA: Diagnosis not present

## 2017-09-01 DIAGNOSIS — D696 Thrombocytopenia, unspecified: Secondary | ICD-10-CM | POA: Diagnosis not present

## 2017-09-01 DIAGNOSIS — C23 Malignant neoplasm of gallbladder: Secondary | ICD-10-CM | POA: Diagnosis not present

## 2017-09-01 DIAGNOSIS — M129 Arthropathy, unspecified: Secondary | ICD-10-CM | POA: Diagnosis not present

## 2017-09-01 DIAGNOSIS — Z7982 Long term (current) use of aspirin: Secondary | ICD-10-CM | POA: Diagnosis not present

## 2017-09-01 DIAGNOSIS — E1165 Type 2 diabetes mellitus with hyperglycemia: Secondary | ICD-10-CM | POA: Diagnosis not present

## 2017-09-01 DIAGNOSIS — R0602 Shortness of breath: Secondary | ICD-10-CM | POA: Diagnosis not present

## 2017-09-01 DIAGNOSIS — K5090 Crohn's disease, unspecified, without complications: Secondary | ICD-10-CM | POA: Diagnosis not present

## 2017-09-01 DIAGNOSIS — R531 Weakness: Secondary | ICD-10-CM | POA: Diagnosis not present

## 2017-09-01 DIAGNOSIS — R748 Abnormal levels of other serum enzymes: Secondary | ICD-10-CM | POA: Diagnosis not present

## 2017-09-01 DIAGNOSIS — E860 Dehydration: Secondary | ICD-10-CM | POA: Diagnosis not present

## 2017-09-01 LAB — CBC WITH DIFFERENTIAL/PLATELET
Basophils Absolute: 0 K/uL (ref 0–0.1)
Basophils Relative: 0 %
Eosinophils Absolute: 0.1 K/uL (ref 0–0.7)
Eosinophils Relative: 2 %
HCT: 26.1 % — ABNORMAL LOW (ref 40.0–52.0)
Hemoglobin: 9.1 g/dL — ABNORMAL LOW (ref 13.0–18.0)
Lymphocytes Relative: 21 %
Lymphs Abs: 0.9 K/uL — ABNORMAL LOW (ref 1.0–3.6)
MCH: 35.5 pg — ABNORMAL HIGH (ref 26.0–34.0)
MCHC: 34.7 g/dL (ref 32.0–36.0)
MCV: 102.2 fL — ABNORMAL HIGH (ref 80.0–100.0)
Monocytes Absolute: 1.1 K/uL — ABNORMAL HIGH (ref 0.2–1.0)
Monocytes Relative: 25 %
Neutro Abs: 2.3 K/uL (ref 1.4–6.5)
Neutrophils Relative %: 52 %
Platelets: 276 K/uL (ref 150–440)
RBC: 2.56 MIL/uL — ABNORMAL LOW (ref 4.40–5.90)
RDW: 20.1 % — ABNORMAL HIGH (ref 11.5–14.5)
WBC: 4.5 K/uL (ref 3.8–10.6)

## 2017-09-01 LAB — SAMPLE TO BLOOD BANK

## 2017-09-01 MED ORDER — HEPARIN SOD (PORK) LOCK FLUSH 100 UNIT/ML IV SOLN
500.0000 [IU] | Freq: Once | INTRAVENOUS | Status: AC
  Administered 2017-09-01: 500 [IU] via INTRAVENOUS

## 2017-09-01 MED ORDER — HEPARIN SOD (PORK) LOCK FLUSH 100 UNIT/ML IV SOLN
INTRAVENOUS | Status: AC
  Filled 2017-09-01: qty 5

## 2017-09-01 NOTE — Progress Notes (Signed)
No blood needed today, per Rulon Abide, NP

## 2017-09-06 ENCOUNTER — Inpatient Hospital Stay: Payer: Medicare Other | Attending: Oncology

## 2017-09-06 DIAGNOSIS — K5090 Crohn's disease, unspecified, without complications: Secondary | ICD-10-CM | POA: Insufficient documentation

## 2017-09-06 DIAGNOSIS — Z79899 Other long term (current) drug therapy: Secondary | ICD-10-CM | POA: Insufficient documentation

## 2017-09-06 DIAGNOSIS — R5383 Other fatigue: Secondary | ICD-10-CM | POA: Insufficient documentation

## 2017-09-06 DIAGNOSIS — C23 Malignant neoplasm of gallbladder: Secondary | ICD-10-CM | POA: Insufficient documentation

## 2017-09-06 DIAGNOSIS — Z5111 Encounter for antineoplastic chemotherapy: Secondary | ICD-10-CM | POA: Insufficient documentation

## 2017-09-06 DIAGNOSIS — G4730 Sleep apnea, unspecified: Secondary | ICD-10-CM | POA: Insufficient documentation

## 2017-09-06 DIAGNOSIS — K5900 Constipation, unspecified: Secondary | ICD-10-CM | POA: Insufficient documentation

## 2017-09-06 DIAGNOSIS — D61818 Other pancytopenia: Secondary | ICD-10-CM | POA: Insufficient documentation

## 2017-09-06 DIAGNOSIS — R630 Anorexia: Secondary | ICD-10-CM | POA: Insufficient documentation

## 2017-09-06 DIAGNOSIS — K219 Gastro-esophageal reflux disease without esophagitis: Secondary | ICD-10-CM | POA: Insufficient documentation

## 2017-09-06 DIAGNOSIS — E7800 Pure hypercholesterolemia, unspecified: Secondary | ICD-10-CM | POA: Insufficient documentation

## 2017-09-06 DIAGNOSIS — R948 Abnormal results of function studies of other organs and systems: Secondary | ICD-10-CM | POA: Insufficient documentation

## 2017-09-06 DIAGNOSIS — R739 Hyperglycemia, unspecified: Secondary | ICD-10-CM | POA: Insufficient documentation

## 2017-09-06 DIAGNOSIS — F418 Other specified anxiety disorders: Secondary | ICD-10-CM | POA: Insufficient documentation

## 2017-09-06 DIAGNOSIS — R531 Weakness: Secondary | ICD-10-CM | POA: Insufficient documentation

## 2017-09-06 NOTE — Progress Notes (Signed)
[place]  [phone] [phone]) [phone]  ID: Gurshan Settlemire Goeser OB: 02-Jun-1935  MR#: [id number]  VVO#:[id number]  Patient Care Team: Leone Haven, MD as PCP - General (Family Medicine) Kathyrn Drown, MD (Family Medicine) Clent Jacks, RN as Registered Nurse  CHIEF COMPLAINT: Adenocarcinoma of gallbladder.  INTERVAL HISTORY: Patient returns to clinic today for further evaluation and consideration of cycle 6, day 1 of cisplatin gemcitabine.  Cycle 5, day 15 was not given secondary to poor performance status and pancytopenia.  He continues to have chronic weakness and fatigue.  He continues to have shortness of breath and dyspnea on exertion. He has an improved appetite with Megace.  His pain is well controlled on his current narcotic regimen. He has no neurologic complaints.  He denies any recent fevers or illnesses. He denies any chest pain.  He does not complain of abdominal pain today.  He denies any nausea or vomiting.  He has no urinary complaints.  Patient offers no further specific complaints today.    REVIEW OF SYSTEMS:   Review of Systems  Constitutional: Positive for malaise/fatigue. Negative for fever and weight loss.  Eyes: Negative for blurred vision.  Respiratory: Positive for shortness of breath. Negative for cough.   Cardiovascular: Negative.  Negative for chest pain and leg swelling.  Gastrointestinal: Negative.  Negative for abdominal pain, blood in stool, constipation, heartburn, melena and nausea.  Genitourinary: Negative.  Negative for dysuria and frequency.  Musculoskeletal: Negative.  Negative for falls.  Skin: Negative.  Negative for rash.  Neurological: Positive for weakness. Negative for sensory change and focal weakness.  Psychiatric/Behavioral: Negative.  The patient is not nervous/anxious.     As per HPI. Otherwise, a complete review of systems is negative.  PAST MEDICAL HISTORY: Past Medical History:  Diagnosis  Date  . Anxiety   . Arthritis   . Chronic lower back pain   . Crohn's disease ([place])   . Depression   . Difficult intubation    patient denies on 02/12/17  . GERD (gastroesophageal reflux disease)   . Gout   . History of kidney stones 40 years ago  . Hypercholesteremia   . Hypertension   . Prediabetes   . Sleep apnea    cpap     PAST SURGICAL HISTORY: Past Surgical History:  Procedure Laterality Date  . CHOLECYSTECTOMY N/A 11/03/2016   Procedure: LAPAROSCOPIC CHOLECYSTostomy with tube placement ;  Surgeon: Johnathan Hausen, MD;  Location: [facility];  Service: General;  Laterality: N/A;  . CHOLECYSTECTOMY N/A 02/15/2017   Procedure: LAPAROSCOPIC SUBTOTAL CHOLECYSTECTOMY, BIOPSY OF GALLBLADDER;  Surgeon: Johnathan Hausen, MD;  Location: [facility];  Service: General;  Laterality: N/A;  . COLECTOMY  1973   "part of large intestines" (05/04/2012)  . COLON RESECTION    . COLONOSCOPY    . COLONOSCOPY N/A 05/23/2014   Procedure: COLONOSCOPY;  Surgeon: Rogene Houston, MD;  Location: [facility];  Service: Endoscopy;  Laterality: N/A;  1200  . ESOPHAGEAL DILATION N/A 05/23/2014   Procedure: ESOPHAGEAL DILATION;  Surgeon: Rogene Houston, MD;  Location: [facility];  Service: Endoscopy;  Laterality: N/A;  . ESOPHAGOGASTRODUODENOSCOPY N/A 03/16/2014   Procedure: esophageal dilation ;  Surgeon: Rogene Houston, MD;  Location: [facility];  Service: Endoscopy;  Laterality: N/A;  . ESOPHAGOGASTRODUODENOSCOPY N/A 05/23/2014   Procedure: ESOPHAGOGASTRODUODENOSCOPY (EGD);  Surgeon: Rogene Houston, MD;  Location: [facility];  Service: Endoscopy;  Laterality: N/A;  . ESOPHAGOGASTRODUODENOSCOPY (EGD) WITH  PROPOFOL N/A 02/18/2017   Procedure: ESOPHAGOGASTRODUODENOSCOPY (EGD) WITH PROPOFOL;  Surgeon: Wonda Horner, MD;  Location: [facility];  Service: Endoscopy;  Laterality: N/A;  . [place]  . IR CHOLANGIOGRAM EXISTING TUBE  11/26/2016  . KIDNEY STONE SURGERY  1970's  . PARTIAL  COLECTOMY  ~ 40 years ago   some of large and small  . PORTA CATH INSERTION N/A 03/17/2017   Procedure: PORTA CATH INSERTION;  Surgeon: Katha Cabal, MD;  Location: [facility];  Service: Cardiovascular;  Laterality: N/A;  . PROSTATE SURGERY    . THYROIDECTOMY Right 05/04/2012   Procedure: RIGHT HEMI THYROIDECTOMY;  Surgeon: Ascencion Dike, MD;  Location: [place];  Service: ENT;  Laterality: Right;  . THYROIDECTOMY    . TONSILLECTOMY AND ADENOIDECTOMY     as a child  . TRANSURETHRAL PROSTATECTOMY WITH GYRUS INSTRUMENTS N/A 07/11/2012   Procedure: TRANSURETHRAL PROSTATECTOMY WITH GYRUS INSTRUMENTS;  Surgeon: Claybon Jabs, MD;  Location: [facility];  Service: Urology;  Laterality: N/A;  . UPPER GASTROINTESTINAL ENDOSCOPY      FAMILY HISTORY: Family History  Problem Relation Age of Onset  . Diabetes Mother   . Stroke Father   . Healthy Daughter   . Healthy Son   . Heart disease Sister     ADVANCED DIRECTIVES (Y/N):  N  HEALTH MAINTENANCE: Social History   Tobacco Use  . Smoking status: Never Smoker  . Smokeless tobacco: Never Used  Substance Use Topics  . Alcohol use: No    Alcohol/week: 0.0 standard drinks  . Drug use: No     Colonoscopy:  PAP:  Bone density:  Lipid panel:  No Known Allergies  Current Outpatient Medications  Medication Sig Dispense Refill  . acetaminophen (TYLENOL) 500 MG tablet Take 1,000 mg by mouth every 4 (four) hours as needed for moderate pain or headache.     . allopurinol (ZYLOPRIM) 300 MG tablet Take 1 tablet (300 mg total) by mouth every morning. 90 tablet 3  . amLODipine (NORVASC) 5 MG tablet TAKE 1 TABLET BY MOUTH  DAILY 90 tablet 3  . aspirin EC 81 MG tablet Take 81 mg by mouth at bedtime.    . fentaNYL (DURAGESIC - DOSED MCG/HR) 25 MCG/HR patch Place 1 patch (25 mcg total) onto the skin every 3 (three) days. 10 patch 0  . Histamine Dihydrochloride (AUSTRALIAN DREAM ARTHRITIS EX) Apply 1 application topically 2 (two) times daily as  needed (back pain).     Marland Kitchen lidocaine-prilocaine (EMLA) cream Apply to port 1 hours prior to chemotherapy appointment. Cover with plastic wrap. 30 g 3  . losartan (COZAAR) 100 MG tablet Take 1 tablet (100 mg total) by mouth daily. 90 tablet 3  . megestrol (MEGACE) 40 MG tablet Take 1 tablet (40 mg total) by mouth daily. 30 tablet 2  . mesalamine (PENTASA) 500 MG CR capsule Take 2 capsules (1,000 mg total) by mouth 2 (two) times daily. 360 capsule 3  . metoprolol tartrate (LOPRESSOR) 50 MG tablet Take 1 tablet (50 mg total) by mouth 2 (two) times daily. 180 tablet 3  . Multiple Vitamin (MULTIVITAMIN WITH MINERALS) TABS tablet Take 2 tablets by mouth daily.    Marland Kitchen oxybutynin (DITROPAN-XL) 5 MG 24 hr tablet Take 1 tablet (5 mg total) by mouth at bedtime. 90 tablet 1  . simvastatin (ZOCOR) 20 MG tablet Take 1 tablet (20 mg total) by mouth daily. 90 tablet 3  . vitamin B-12 (CYANOCOBALAMIN) 1000 MCG tablet  Take 1,000 mcg by mouth 2 (two) times daily.    . bisacodyl (DULCOLAX) 10 MG suppository Place 1 suppository (10 mg total) rectally daily as needed for moderate constipation. (Patient not taking: Reported on 08/25/2017) 12 suppository 0  . fluticasone (FLONASE) 50 MCG/ACT nasal spray USE 1 SPRAY INTO BOTH  NOSTRILS DAILY (Patient not taking: Reported on 08/25/2017) 32 g 0  . HYDROcodone-acetaminophen (NORCO/VICODIN) 5-325 MG tablet Take 1-2 tablets by mouth every 4 (four) hours as needed for moderate pain. (Patient not taking: Reported on 08/25/2017) 30 tablet 0  . lactulose (CHRONULAC) 10 GM/15ML solution Take 15 mLs (10 g total) by mouth daily. (Patient not taking: Reported on 08/27/2017) 240 mL 1  . ondansetron (ZOFRAN) 8 MG tablet Take 1 tablet (8 mg total) by mouth 2 (two) times daily. (Patient not taking: Reported on 08/25/2017) 30 tablet 3  . pantoprazole (PROTONIX) 40 MG tablet Take 1 tablet (40 mg total) by mouth daily. (Patient not taking: Reported on 08/27/2017) 90 tablet 1  . Simethicone 180 MG CAPS  Take 1 capsule (180 mg total) by mouth 3 (three) times daily as needed. (Patient not taking: Reported on 08/25/2017)  0   No current facility-administered medications for this visit.    Facility-Administered Medications Ordered in Other Visits  Medication Dose Route Frequency Provider Last Rate Last Dose  . heparin lock flush 100 unit/mL  500 Units Intravenous Once Faythe Casa E, NP      . sodium chloride flush (NS) 0.9 % injection 10 mL  10 mL Intravenous PRN Jacquelin Hawking, NP   10 mL at 07/12/17 1445    OBJECTIVE: Vitals:   09/08/17 0923 09/08/17 0929  BP: 109/73   Pulse: 80   Resp: (!) 21   Temp: 97.8 F (36.6 C)   SpO2:  99%     Body mass index is 26.64 kg/m.    ECOG FS:1 - Symptomatic but completely ambulatory  General: Well-developed, well-nourished, no acute distress. Eyes: Pink conjunctiva, anicteric sclera. HEENT: Normocephalic, moist mucous membranes. Lungs: Clear to auscultation bilaterally. Heart: Regular rate and rhythm. No rubs, murmurs, or gallops. Abdomen: Soft, nontender, nondistended. No organomegaly noted, normoactive bowel sounds. Musculoskeletal: No edema, cyanosis, or clubbing. Neuro: Alert, answering all questions appropriately. Cranial nerves grossly intact. Skin: No rashes or petechiae noted. Psych: Normal affect.  LAB RESULTS:  Lab Results  Component Value Date   NA 135 09/08/2017   K 4.3 09/08/2017   CL 109 09/08/2017   CO2 18 (L) 09/08/2017   GLUCOSE 152 (H) 09/08/2017   BUN 40 (H) 09/08/2017   CREATININE 1.04 09/08/2017   CALCIUM 9.0 09/08/2017   PROT 7.1 09/08/2017   ALBUMIN 3.5 09/08/2017   AST 42 (H) 09/08/2017   ALT 36 09/08/2017   ALKPHOS 273 (H) 09/08/2017   BILITOT 0.5 09/08/2017   GFRNONAA >60 09/08/2017   GFRAA >60 09/08/2017    Lab Results  Component Value Date   WBC 7.6 09/08/2017   NEUTROABS 5.5 09/08/2017   HGB 9.5 (L) 09/08/2017   HCT 27.9 (L) 09/08/2017   MCV 103.4 (H) 09/08/2017   PLT 247 09/08/2017      STUDIES: No results found.  ASSESSMENT: Adenocarcinoma of gallbladder.  PLAN:    1. Adenocarcinoma of gallbladder: Restaging PET results from July 06, 2017 reviewed independently with improvement of disease burden.  Patient still has residual disease, therefore will continue treatment as planned.  Both gemcitabine and cisplatin have been dose reduced.  He continues to receive cisplatin  and gemcitabine on day 1 and then gemcitabine only on days 8 and day 15.  This is a 28-day cycle.  Patient missed day 15 on both cycles 4 and 5 secondary to decreased performance status and pancytopenia.  Proceed with cycle 6, day 1 today.  Return to clinic in 1 week for further evaluation and consideration of cycle 6, day 8.  Plan to reimage with PET scan at the conclusion of cycle 6.   2.  Poor appetite: Appreciate dietary input.  Continue Megace as prescribed.   3.  Hyperglycemia: Patient has improved blood glucose control.  Monitor. 4.  Pain: Well controlled on his current narcotic regimen.  Continue fentanyl and hydrocodone as prescribed.   5.  Elevated liver enzymes: AST is only mildly elevated 42.  Monitor. 6.  Anemia: Hemoglobin relatively stable at 9.5.  He does not require an additional blood transfusion today, monitor.   7.  Thrombocytopenia: Resolved.  Proceed with treatment as above.  Both gemcitabine and cisplatin have been dose reduced. 8.  Neutropenia: Resolved.   9  Weakness and fatigue: Continue CARE program as ordered.  Patient expressed understanding and was in agreement with this plan. He also understands that He can call clinic at any time with any questions, concerns, or complaints.   Cancer Staging Adenocarcinoma of gallbladder [facility]) Staging form: Gallbladder, AJCC 8th Edition - Clinical stage from 03/03/2017: Stage IVB (cT3, cN0, cM1) - Signed by Lloyd Huger, MD on 03/23/2017   Lloyd Huger, MD   09/11/2017 6:57 PM

## 2017-09-06 NOTE — Progress Notes (Signed)
Nutrition Follow-up:  Patient with adenocarcinoma of gallbladder receiving chemotherapy.    Met with patient in clinic this am.  Reports that he is feeling better as he has not had chemotherapy recently but will be taking a treatment on Wednesday.  Reports that he is still short of breath from anemia.  Has been trying to drink ensure 3 times per day.  Reports that he has been eating better recently. Reports bowels are moving regularly but knows that after chemotherapy that likely will have constipation.      Medications: reviewed  Labs: Hgb 9.1  Anthropometrics:   Weight stable at 180 lb (7/26)  191 lb in Jan 2019 at RD initial visit   NUTRITION DIAGNOSIS: Malnutrition stable   MALNUTRITION DIAGNOSIS: severe malnutrition stable   INTERVENTION:  Discussed foods to help with anemia.  Handout provided to patient. Encouraged at least 3 ensure enlive daily to help meet nutritional needs and maintain weight.   Another case of ensure enlive given today, along with coupons.     MONITORING, EVALUATION, GOAL: weight trends, intake   NEXT VISIT: as needed  Thomas David, [place], [place] Registered Dietitian [phone] (pager)

## 2017-09-07 ENCOUNTER — Other Ambulatory Visit: Payer: Self-pay | Admitting: Oncology

## 2017-09-07 ENCOUNTER — Other Ambulatory Visit: Payer: Self-pay | Admitting: *Deleted

## 2017-09-07 DIAGNOSIS — C23 Malignant neoplasm of gallbladder: Secondary | ICD-10-CM

## 2017-09-08 ENCOUNTER — Ambulatory Visit: Payer: Medicare Other | Admitting: Family Medicine

## 2017-09-08 ENCOUNTER — Inpatient Hospital Stay: Payer: Medicare Other

## 2017-09-08 ENCOUNTER — Inpatient Hospital Stay (HOSPITAL_BASED_OUTPATIENT_CLINIC_OR_DEPARTMENT_OTHER): Payer: Medicare Other | Admitting: Oncology

## 2017-09-08 ENCOUNTER — Encounter: Payer: Self-pay | Admitting: Oncology

## 2017-09-08 VITALS — BP 109/73 | HR 80 | Temp 97.8°F | Resp 21 | Wt 180.4 lb

## 2017-09-08 DIAGNOSIS — C23 Malignant neoplasm of gallbladder: Secondary | ICD-10-CM | POA: Diagnosis not present

## 2017-09-08 DIAGNOSIS — K219 Gastro-esophageal reflux disease without esophagitis: Secondary | ICD-10-CM | POA: Diagnosis not present

## 2017-09-08 DIAGNOSIS — K5090 Crohn's disease, unspecified, without complications: Secondary | ICD-10-CM | POA: Diagnosis not present

## 2017-09-08 DIAGNOSIS — K5900 Constipation, unspecified: Secondary | ICD-10-CM | POA: Diagnosis not present

## 2017-09-08 DIAGNOSIS — Z79899 Other long term (current) drug therapy: Secondary | ICD-10-CM | POA: Diagnosis not present

## 2017-09-08 DIAGNOSIS — D61818 Other pancytopenia: Secondary | ICD-10-CM | POA: Diagnosis not present

## 2017-09-08 DIAGNOSIS — R630 Anorexia: Secondary | ICD-10-CM

## 2017-09-08 DIAGNOSIS — R5383 Other fatigue: Secondary | ICD-10-CM | POA: Diagnosis not present

## 2017-09-08 DIAGNOSIS — R531 Weakness: Secondary | ICD-10-CM

## 2017-09-08 DIAGNOSIS — F418 Other specified anxiety disorders: Secondary | ICD-10-CM | POA: Diagnosis not present

## 2017-09-08 DIAGNOSIS — R739 Hyperglycemia, unspecified: Secondary | ICD-10-CM

## 2017-09-08 DIAGNOSIS — R948 Abnormal results of function studies of other organs and systems: Secondary | ICD-10-CM | POA: Diagnosis not present

## 2017-09-08 DIAGNOSIS — D649 Anemia, unspecified: Secondary | ICD-10-CM

## 2017-09-08 DIAGNOSIS — Z5111 Encounter for antineoplastic chemotherapy: Secondary | ICD-10-CM | POA: Diagnosis not present

## 2017-09-08 DIAGNOSIS — E7800 Pure hypercholesterolemia, unspecified: Secondary | ICD-10-CM | POA: Diagnosis not present

## 2017-09-08 DIAGNOSIS — G4730 Sleep apnea, unspecified: Secondary | ICD-10-CM | POA: Diagnosis not present

## 2017-09-08 LAB — COMPREHENSIVE METABOLIC PANEL
ALBUMIN: 3.5 g/dL (ref 3.5–5.0)
ALK PHOS: 273 U/L — AB (ref 38–126)
ALT: 36 U/L (ref 0–44)
AST: 42 U/L — ABNORMAL HIGH (ref 15–41)
Anion gap: 8 (ref 5–15)
BUN: 40 mg/dL — ABNORMAL HIGH (ref 8–23)
CALCIUM: 9 mg/dL (ref 8.9–10.3)
CO2: 18 mmol/L — AB (ref 22–32)
CREATININE: 1.04 mg/dL (ref 0.61–1.24)
Chloride: 109 mmol/L (ref 98–111)
GFR calc non Af Amer: >60 mL/min (ref >60)
GLUCOSE: 152 mg/dL — AB (ref 70–99)
Potassium: 4.3 mmol/L (ref 3.5–5.1)
SODIUM: 135 mmol/L (ref 135–145)
Total Bilirubin: 0.5 mg/dL (ref 0.3–1.2)
Total Protein: 7.1 g/dL (ref 6.5–8.1)

## 2017-09-08 LAB — CBC WITH DIFFERENTIAL/PLATELET
BASOS PCT: 1 %
Basophils Absolute: 0.1 10*3/uL (ref 0–0.1)
EOS ABS: 0.1 10*3/uL (ref 0–0.7)
Eosinophils Relative: 1 %
HCT: 27.9 % — ABNORMAL LOW (ref 40.0–52.0)
Hemoglobin: 9.5 g/dL — ABNORMAL LOW (ref 13.0–18.0)
Lymphocytes Relative: 12 %
Lymphs Abs: 0.9 10*3/uL — ABNORMAL LOW (ref 1.0–3.6)
MCH: 35.2 pg — ABNORMAL HIGH (ref 26.0–34.0)
MCHC: 34.1 g/dL (ref 32.0–36.0)
MCV: 103.4 fL — ABNORMAL HIGH (ref 80.0–100.0)
MONO ABS: 1.1 10*3/uL — AB (ref 0.2–1.0)
MONOS PCT: 14 %
Neutro Abs: 5.5 10*3/uL (ref 1.4–6.5)
Neutrophils Relative %: 72 %
Platelets: 247 10*3/uL (ref 150–440)
RBC: 2.7 MIL/uL — ABNORMAL LOW (ref 4.40–5.90)
RDW: 20 % — AB (ref 11.5–14.5)
WBC: 7.6 10*3/uL (ref 3.8–10.6)

## 2017-09-08 LAB — SAMPLE TO BLOOD BANK

## 2017-09-08 MED ORDER — SODIUM CHLORIDE 0.9% FLUSH
10.0000 mL | INTRAVENOUS | Status: DC | PRN
  Administered 2017-09-08: 10 mL via INTRAVENOUS
  Filled 2017-09-08: qty 10

## 2017-09-08 MED ORDER — SODIUM CHLORIDE 0.9 % IV SOLN
1600.0000 mg | Freq: Once | INTRAVENOUS | Status: AC
  Administered 2017-09-08: 1600 mg via INTRAVENOUS
  Filled 2017-09-08: qty 26.3

## 2017-09-08 MED ORDER — SODIUM CHLORIDE 0.9 % IV SOLN
40.0000 mg/m2 | Freq: Once | INTRAVENOUS | Status: AC
  Administered 2017-09-08: 84 mg via INTRAVENOUS
  Filled 2017-09-08: qty 84

## 2017-09-08 MED ORDER — HEPARIN SOD (PORK) LOCK FLUSH 100 UNIT/ML IV SOLN
500.0000 [IU] | Freq: Once | INTRAVENOUS | Status: AC
  Administered 2017-09-08: 500 [IU] via INTRAVENOUS
  Filled 2017-09-08: qty 5

## 2017-09-08 MED ORDER — PALONOSETRON HCL INJECTION 0.25 MG/5ML
0.2500 mg | Freq: Once | INTRAVENOUS | Status: AC
  Administered 2017-09-08: 0.25 mg via INTRAVENOUS
  Filled 2017-09-08: qty 5

## 2017-09-08 MED ORDER — SODIUM CHLORIDE 0.9 % IV SOLN
Freq: Once | INTRAVENOUS | Status: AC
  Administered 2017-09-08: 10:00:00 via INTRAVENOUS
  Filled 2017-09-08: qty 1000

## 2017-09-08 MED ORDER — SODIUM CHLORIDE 0.9 % IV SOLN: 2000.0000 mg | Freq: Once | INTRAVENOUS | Status: DC

## 2017-09-08 MED ORDER — POTASSIUM CHLORIDE 2 MEQ/ML IV SOLN
Freq: Once | INTRAVENOUS | Status: AC
  Administered 2017-09-08: 11:00:00 via INTRAVENOUS
  Filled 2017-09-08: qty 1000

## 2017-09-08 MED ORDER — SODIUM CHLORIDE 0.9 % IV SOLN
Freq: Once | INTRAVENOUS | Status: AC
  Administered 2017-09-08: 13:00:00 via INTRAVENOUS
  Filled 2017-09-08: qty 5

## 2017-09-08 NOTE — Progress Notes (Signed)
Clarified gemcitabine dose with MD.  Should be 891m/m2

## 2017-09-08 NOTE — Progress Notes (Signed)
Pt in for follow up, daughter present today.  Pt reports increased shortness of breath yesterday and today.

## 2017-09-12 NOTE — Progress Notes (Signed)
[place]  [phone] [phone]) [phone]  ID: Thomas David OB: 10-08-1935  MR#: [id number]  S[facility]#:[id number]  Patient Care Team: Leone Haven, MD as PCP - General (Family Medicine) Kathyrn Drown, MD (Family Medicine) Clent Jacks, RN as Registered Nurse  CHIEF COMPLAINT: Adenocarcinoma of gallbladder.  INTERVAL HISTORY: Patient returns to clinic today for further evaluation and consideration of cycle 6, day 8 of cisplatin and gemcitabine.  He continues to have chronic weakness and fatigue.  He continues to have shortness of breath and dyspnea on exertion. He has an improved appetite with Megace.  He does not complain of pain today.  He has no neurologic complaints.  He denies any recent fevers or illnesses. He denies any chest pain.  He does not complain of abdominal pain today.  He denies any nausea, vomiting, or diarrhea.  He continues to have occasional problems with constipation.  He has no urinary complaints.  Patient offers no further specific complaints today.  REVIEW OF SYSTEMS:   Review of Systems  Constitutional: Positive for malaise/fatigue. Negative for fever and weight loss.  Eyes: Negative for blurred vision.  Respiratory: Positive for shortness of breath. Negative for cough.   Cardiovascular: Negative.  Negative for chest pain and leg swelling.  Gastrointestinal: Positive for constipation. Negative for abdominal pain, blood in stool, heartburn, melena and nausea.  Genitourinary: Negative.  Negative for dysuria and frequency.  Musculoskeletal: Negative.  Negative for falls.  Skin: Negative.  Negative for rash.  Neurological: Positive for weakness. Negative for sensory change and focal weakness.  Psychiatric/Behavioral: Negative.  The patient is not nervous/anxious.     As per HPI. Otherwise, a complete review of systems is negative.  PAST MEDICAL HISTORY: Past Medical History:  Diagnosis Date  . Anxiety   . Arthritis   .  Chronic lower back pain   . Crohn's disease ([place])   . Depression   . Difficult intubation    patient denies on 02/12/17  . GERD (gastroesophageal reflux disease)   . Gout   . History of kidney stones 40 years ago  . Hypercholesteremia   . Hypertension   . Prediabetes   . Sleep apnea    cpap     PAST SURGICAL HISTORY: Past Surgical History:  Procedure Laterality Date  . CHOLECYSTECTOMY N/A 11/03/2016   Procedure: LAPAROSCOPIC CHOLECYSTostomy with tube placement ;  Surgeon: Johnathan Hausen, MD;  Location: [facility];  Service: General;  Laterality: N/A;  . CHOLECYSTECTOMY N/A 02/15/2017   Procedure: LAPAROSCOPIC SUBTOTAL CHOLECYSTECTOMY, BIOPSY OF GALLBLADDER;  Surgeon: Johnathan Hausen, MD;  Location: [facility];  Service: General;  Laterality: N/A;  . COLECTOMY  1973   "part of large intestines" (05/04/2012)  . COLON RESECTION    . COLONOSCOPY    . COLONOSCOPY N/A 05/23/2014   Procedure: COLONOSCOPY;  Surgeon: Rogene Houston, MD;  [facility];  Service: Endoscopy;  Laterality: N/A;  1200  . ESOPHAGEAL DILATION N/A 05/23/2014   Procedure: ESOPHAGEAL DILATION;  Surgeon: Rogene Houston, MD;  [facility];  Service: Endoscopy;  Laterality: N/A;  . ESOPHAGOGASTRODUODENOSCOPY N/A 03/16/2014   Procedure: esophageal dilation ;  Surgeon: Rogene Houston, MD;  [facility];  Service: Endoscopy;  Laterality: N/A;  . ESOPHAGOGASTRODUODENOSCOPY N/A 05/23/2014   Procedure: ESOPHAGOGASTRODUODENOSCOPY (EGD);  Surgeon: Rogene Houston, MD;  [facility];  Service: Endoscopy;  Laterality: N/A;  . ESOPHAGOGASTRODUODENOSCOPY (EGD) WITH PROPOFOL N/A 02/18/2017   Procedure: ESOPHAGOGASTRODUODENOSCOPY (EGD)  WITH PROPOFOL;  Surgeon: Wonda Horner, MD;  Location: [facility];  Service: Endoscopy;  Laterality: N/A;  . [place]  . IR CHOLANGIOGRAM EXISTING TUBE  11/26/2016  . KIDNEY STONE SURGERY  1970's  . PARTIAL COLECTOMY  ~ 40 years ago   some of large  and small  . PORTA CATH INSERTION N/A 03/17/2017   Procedure: PORTA CATH INSERTION;  Surgeon: Katha Cabal, MD;  Location: [facility];  Service: Cardiovascular;  Laterality: N/A;  . PROSTATE SURGERY    . THYROIDECTOMY Right 05/04/2012   Procedure: RIGHT HEMI THYROIDECTOMY;  Surgeon: Ascencion Dike, MD;  Location: [facility];  Service: ENT;  Laterality: Right;  . THYROIDECTOMY    . TONSILLECTOMY AND ADENOIDECTOMY     as a child  . TRANSURETHRAL PROSTATECTOMY WITH GYRUS INSTRUMENTS N/A 07/11/2012   Procedure: TRANSURETHRAL PROSTATECTOMY WITH GYRUS INSTRUMENTS;  Surgeon: Claybon Jabs, MD;  [facility];  Service: Urology;  Laterality: N/A;  . UPPER GASTROINTESTINAL ENDOSCOPY      FAMILY HISTORY: Family History  Problem Relation Age of Onset  . Diabetes Mother   . Stroke Father   . Healthy Daughter   . Healthy Son   . Heart disease Sister     ADVANCED DIRECTIVES (Y/N):  N  HEALTH MAINTENANCE: Social History   Tobacco Use  . Smoking status: Never Smoker  . Smokeless tobacco: Never Used  Substance Use Topics  . Alcohol use: No    Alcohol/week: 0.0 standard drinks  . Drug use: No     Colonoscopy:  PAP:  Bone density:  Lipid panel:  No Known Allergies  Current Outpatient Medications  Medication Sig Dispense Refill  . acetaminophen (TYLENOL) 500 MG tablet Take 1,000 mg by mouth every 4 (four) hours as needed for moderate pain or headache.     . allopurinol (ZYLOPRIM) 300 MG tablet Take 1 tablet (300 mg total) by mouth every morning. 90 tablet 3  . amLODipine (NORVASC) 5 MG tablet TAKE 1 TABLET BY MOUTH  DAILY 90 tablet 3  . aspirin EC 81 MG tablet Take 81 mg by mouth at bedtime.    . bisacodyl (DULCOLAX) 10 MG suppository Place 1 suppository (10 mg total) rectally daily as needed for moderate constipation. (Patient not taking: Reported on 08/25/2017) 12 suppository 0  . fentaNYL (DURAGESIC - DOSED MCG/HR) 25 MCG/HR patch Place 1 patch (25 mcg total) onto the skin  every 3 (three) days. 10 patch 0  . fluticasone (FLONASE) 50 MCG/ACT nasal spray USE 1 SPRAY INTO BOTH  NOSTRILS DAILY (Patient not taking: Reported on 08/25/2017) 32 g 0  . Histamine Dihydrochloride (AUSTRALIAN DREAM ARTHRITIS EX) Apply 1 application topically 2 (two) times daily as needed (back pain).     Marland Kitchen HYDROcodone-acetaminophen (NORCO/VICODIN) 5-325 MG tablet Take 1-2 tablets by mouth every 4 (four) hours as needed for moderate pain. (Patient not taking: Reported on 08/25/2017) 30 tablet 0  . lactulose (CHRONULAC) 10 GM/15ML solution Take 15 mLs (10 g total) by mouth daily. (Patient not taking: Reported on 08/27/2017) 240 mL 1  . lidocaine-prilocaine (EMLA) cream Apply to port 1 hours prior to chemotherapy appointment. Cover with plastic wrap. 30 g 3  . losartan (COZAAR) 100 MG tablet Take 1 tablet (100 mg total) by mouth daily. 90 tablet 3  . megestrol (MEGACE) 40 MG tablet Take 1 tablet (40 mg total) by mouth daily. 30 tablet 2  . mesalamine (PENTASA) 500 MG CR capsule Take 2 capsules (  1,000 mg total) by mouth 2 (two) times daily. 360 capsule 3  . metoprolol tartrate (LOPRESSOR) 50 MG tablet Take 1 tablet (50 mg total) by mouth 2 (two) times daily. 180 tablet 3  . Multiple Vitamin (MULTIVITAMIN WITH MINERALS) TABS tablet Take 2 tablets by mouth daily.    . ondansetron (ZOFRAN) 8 MG tablet Take 1 tablet (8 mg total) by mouth 2 (two) times daily. (Patient not taking: Reported on 08/25/2017) 30 tablet 3  . oxybutynin (DITROPAN-XL) 5 MG 24 hr tablet Take 1 tablet (5 mg total) by mouth at bedtime. 90 tablet 1  . pantoprazole (PROTONIX) 40 MG tablet Take 1 tablet (40 mg total) by mouth daily. (Patient not taking: Reported on 08/27/2017) 90 tablet 1  . Simethicone 180 MG CAPS Take 1 capsule (180 mg total) by mouth 3 (three) times daily as needed. (Patient not taking: Reported on 08/25/2017)  0  . simvastatin (ZOCOR) 20 MG tablet Take 1 tablet (20 mg total) by mouth daily. 90 tablet 3  . vitamin B-12  (CYANOCOBALAMIN) 1000 MCG tablet Take 1,000 mcg by mouth 2 (two) times daily.     No current facility-administered medications for this visit.    Facility-Administered Medications Ordered in Other Visits  Medication Dose Route Frequency Provider Last Rate Last Dose  . heparin lock flush 100 unit/mL  500 Units Intravenous Once Faythe Casa E, NP      . sodium chloride flush (NS) 0.9 % injection 10 mL  10 mL Intravenous PRN Jacquelin Hawking, NP   10 mL at 07/12/17 1445    OBJECTIVE: There were no vitals filed for this visit.   There is no height or weight on file to calculate BMI.    ECOG FS:1 - Symptomatic but completely ambulatory  General: Well-developed, well-nourished, no acute distress. Eyes: Pink conjunctiva, anicteric sclera. HEENT: Normocephalic, moist mucous membranes. Lungs: Clear to auscultation bilaterally. Heart: Regular rate and rhythm. No rubs, murmurs, or gallops. Abdomen: Soft, nontender, nondistended. No organomegaly noted, normoactive bowel sounds. Musculoskeletal: No edema, cyanosis, or clubbing. Neuro: Alert, answering all questions appropriately. Cranial nerves grossly intact. Skin: No rashes or petechiae noted. Psych: Normal affect.  LAB RESULTS:  Lab Results  Component Value Date   NA 133 (L) 09/15/2017   K 4.7 09/15/2017   CL 105 09/15/2017   CO2 18 (L) 09/15/2017   GLUCOSE 162 (H) 09/15/2017   BUN 42 (H) 09/15/2017   CREATININE 1.24 09/15/2017   CALCIUM 9.0 09/15/2017   PROT 7.2 09/15/2017   ALBUMIN 3.6 09/15/2017   AST 40 09/15/2017   ALT 39 09/15/2017   ALKPHOS 293 (H) 09/15/2017   BILITOT 0.5 09/15/2017   GFRNONAA 54 (L) 09/15/2017   GFRAA >60 09/15/2017    Lab Results  Component Value Date   WBC 7.1 09/15/2017   NEUTROABS 5.8 09/15/2017   HGB 9.8 (L) 09/15/2017   HCT 29.1 (L) 09/15/2017   MCV 103.4 (H) 09/15/2017   PLT 144 (L) 09/15/2017     STUDIES: No results found.  ASSESSMENT: Adenocarcinoma of gallbladder.  PLAN:     1. Adenocarcinoma of gallbladder: Restaging PET results from July 06, 2017 reviewed independently with improvement of disease burden.  Patient still has residual disease, therefore treatment was continued. Both gemcitabine and cisplatin have been dose reduced.  He continues to receive cisplatin and gemcitabine on day 1 and then gemcitabine only on days 8 and day 15.  This is a 28-day cycle.  Patient missed day 15 on both cycles  4 and 5 secondary to decreased performance status and pancytopenia.  Proceed with cycle 6, day 8 today.  Return to clinic in 1 week for further evaluation and consideration of cycle 6, day 15.  Plan to reimage with PET scan at the conclusion of cycle 6.  This is been scheduled for October 11, 2017. 2.  Poor appetite: Improved.  Appreciate dietary input.  Continue Megace as prescribed.   3.  Hyperglycemia: Patient has improved blood glucose control.  Monitor. 4.  Pain: Patient does not complain of this today.  Well controlled on his current narcotic regimen.  Continue fentanyl and hydrocodone as prescribed.   5.  Elevated liver enzymes: Resolved.   6.  Anemia: Hemoglobin stable at 9.8.  He does not require an additional blood transfusion today, monitor.   7.  Thrombocytopenia: Mild, platelets 144 today.  Proceed with treatment as above.  Both gemcitabine and cisplatin have been dose reduced. 8.  Neutropenia: Resolved.   9  Weakness and fatigue: Continue CARE program as ordered.  Patient expressed understanding and was in agreement with this plan. He also understands that He can call clinic at any time with any questions, concerns, or complaints.   Cancer Staging Adenocarcinoma of gallbladder [facility]) Staging form: Gallbladder, AJCC 8th Edition - Clinical stage from 03/03/2017: Stage IVB (cT3, cN0, cM1) - Signed by Lloyd Huger, MD on 03/23/2017   Lloyd Huger, MD   09/19/2017 7:08 AM

## 2017-09-15 ENCOUNTER — Inpatient Hospital Stay: Payer: Medicare Other

## 2017-09-15 ENCOUNTER — Inpatient Hospital Stay (HOSPITAL_BASED_OUTPATIENT_CLINIC_OR_DEPARTMENT_OTHER): Payer: Medicare Other | Admitting: Oncology

## 2017-09-15 DIAGNOSIS — R948 Abnormal results of function studies of other organs and systems: Secondary | ICD-10-CM | POA: Diagnosis not present

## 2017-09-15 DIAGNOSIS — K5090 Crohn's disease, unspecified, without complications: Secondary | ICD-10-CM | POA: Diagnosis not present

## 2017-09-15 DIAGNOSIS — R630 Anorexia: Secondary | ICD-10-CM

## 2017-09-15 DIAGNOSIS — K5900 Constipation, unspecified: Secondary | ICD-10-CM

## 2017-09-15 DIAGNOSIS — F418 Other specified anxiety disorders: Secondary | ICD-10-CM

## 2017-09-15 DIAGNOSIS — R5383 Other fatigue: Secondary | ICD-10-CM | POA: Diagnosis not present

## 2017-09-15 DIAGNOSIS — R739 Hyperglycemia, unspecified: Secondary | ICD-10-CM

## 2017-09-15 DIAGNOSIS — C23 Malignant neoplasm of gallbladder: Secondary | ICD-10-CM

## 2017-09-15 DIAGNOSIS — G4730 Sleep apnea, unspecified: Secondary | ICD-10-CM | POA: Diagnosis not present

## 2017-09-15 DIAGNOSIS — K219 Gastro-esophageal reflux disease without esophagitis: Secondary | ICD-10-CM | POA: Diagnosis not present

## 2017-09-15 DIAGNOSIS — E7800 Pure hypercholesterolemia, unspecified: Secondary | ICD-10-CM | POA: Diagnosis not present

## 2017-09-15 DIAGNOSIS — R531 Weakness: Secondary | ICD-10-CM

## 2017-09-15 DIAGNOSIS — Z5111 Encounter for antineoplastic chemotherapy: Secondary | ICD-10-CM | POA: Diagnosis not present

## 2017-09-15 DIAGNOSIS — D649 Anemia, unspecified: Secondary | ICD-10-CM

## 2017-09-15 DIAGNOSIS — D61818 Other pancytopenia: Secondary | ICD-10-CM | POA: Diagnosis not present

## 2017-09-15 DIAGNOSIS — Z79899 Other long term (current) drug therapy: Secondary | ICD-10-CM

## 2017-09-15 LAB — COMPREHENSIVE METABOLIC PANEL
ALBUMIN: 3.6 g/dL (ref 3.5–5.0)
ALT: 39 U/L (ref 0–44)
ANION GAP: 10 (ref 5–15)
AST: 40 U/L (ref 15–41)
Alkaline Phosphatase: 293 U/L — ABNORMAL HIGH (ref 38–126)
BUN: 42 mg/dL — ABNORMAL HIGH (ref 8–23)
CALCIUM: 9 mg/dL (ref 8.9–10.3)
CO2: 18 mmol/L — AB (ref 22–32)
Chloride: 105 mmol/L (ref 98–111)
Creatinine, Ser: 1.24 mg/dL (ref 0.61–1.24)
GFR calc non Af Amer: 54 mL/min — ABNORMAL LOW (ref >60)
GLUCOSE: 162 mg/dL — AB (ref 70–99)
POTASSIUM: 4.7 mmol/L (ref 3.5–5.1)
SODIUM: 133 mmol/L — AB (ref 135–145)
Total Bilirubin: 0.5 mg/dL (ref 0.3–1.2)
Total Protein: 7.2 g/dL (ref 6.5–8.1)

## 2017-09-15 LAB — CBC WITH DIFFERENTIAL/PLATELET
BASOS ABS: 0 10*3/uL (ref 0–0.1)
BASOS PCT: 1 %
EOS ABS: 0 10*3/uL (ref 0–0.7)
Eosinophils Relative: 0 %
HCT: 29.1 % — ABNORMAL LOW (ref 40.0–52.0)
HEMOGLOBIN: 9.8 g/dL — AB (ref 13.0–18.0)
Lymphocytes Relative: 11 %
Lymphs Abs: 0.8 10*3/uL — ABNORMAL LOW (ref 1.0–3.6)
MCH: 34.8 pg — ABNORMAL HIGH (ref 26.0–34.0)
MCHC: 33.6 g/dL (ref 32.0–36.0)
MCV: 103.4 fL — ABNORMAL HIGH (ref 80.0–100.0)
MONO ABS: 0.5 10*3/uL (ref 0.2–1.0)
Monocytes Relative: 7 %
NEUTROS PCT: 81 %
Neutro Abs: 5.8 10*3/uL (ref 1.4–6.5)
Platelets: 144 10*3/uL — ABNORMAL LOW (ref 150–440)
RBC: 2.81 MIL/uL — ABNORMAL LOW (ref 4.40–5.90)
RDW: 19.1 % — AB (ref 11.5–14.5)
WBC: 7.1 10*3/uL (ref 3.8–10.6)

## 2017-09-15 LAB — SAMPLE TO BLOOD BANK

## 2017-09-15 MED ORDER — SODIUM CHLORIDE 0.9% FLUSH
10.0000 mL | INTRAVENOUS | Status: DC | PRN
  Administered 2017-09-15: 10 mL
  Filled 2017-09-15: qty 10

## 2017-09-15 MED ORDER — SODIUM CHLORIDE 0.9 % IV SOLN
Freq: Once | INTRAVENOUS | Status: AC
  Administered 2017-09-15: 11:00:00 via INTRAVENOUS
  Filled 2017-09-15: qty 1000

## 2017-09-15 MED ORDER — PROCHLORPERAZINE MALEATE 10 MG PO TABS
10.0000 mg | ORAL_TABLET | Freq: Once | ORAL | Status: AC
  Administered 2017-09-15: 10 mg via ORAL
  Filled 2017-09-15: qty 1

## 2017-09-15 MED ORDER — SODIUM CHLORIDE 0.9 % IV SOLN
1600.0000 mg | Freq: Once | INTRAVENOUS | Status: AC
  Administered 2017-09-15: 1600 mg via INTRAVENOUS
  Filled 2017-09-15: qty 26.3

## 2017-09-15 MED ORDER — HEPARIN SOD (PORK) LOCK FLUSH 100 UNIT/ML IV SOLN
500.0000 [IU] | Freq: Once | INTRAVENOUS | Status: AC | PRN
  Administered 2017-09-15: 500 [IU]
  Filled 2017-09-15: qty 5

## 2017-09-21 NOTE — Progress Notes (Signed)
[place]  Telephone:(336) [phone] Fax:(336) [phone]  ID: Thomas David No OB: 05/31/1935  MR#: [id number]  OEU#:[id number]  Patient Care Team: Leone Haven, MD as PCP - General (Family Medicine) Kathyrn Drown, MD (Family Medicine) Clent Jacks, RN as Registered Nurse  CHIEF COMPLAINT: Adenocarcinoma of gallbladder.  INTERVAL HISTORY: Patient returns to clinic today for further evaluation and consideration of cycle 6, day 15 of cisplatin and gemcitabine.  Gemcitabine only.  He has noted increased weakness and fatigue this past week.  He also complains of dyspnea on exertion.  He has a fair appetite.  He denies diarrhea, but has loose frequent stools.  He does not complain of pain today.  He has no neurologic complaints.  He denies any recent fevers or illnesses. He denies any chest pain.  He does not complain of abdominal pain today.  He has no urinary complaints.  Patient offers no further specific complaints today.  REVIEW OF SYSTEMS:   Review of Systems  Constitutional: Positive for malaise/fatigue. Negative for fever and weight loss.  Eyes: Negative for blurred vision.  Respiratory: Positive for shortness of breath. Negative for cough.   Cardiovascular: Negative.  Negative for chest pain and leg swelling.  Gastrointestinal: Negative.  Negative for abdominal pain, blood in stool, constipation, heartburn, melena and nausea.  Genitourinary: Negative.  Negative for dysuria and frequency.  Musculoskeletal: Negative.  Negative for falls.  Skin: Negative.  Negative for rash.  Neurological: Positive for weakness. Negative for sensory change and focal weakness.  Psychiatric/Behavioral: Negative.  The patient is not nervous/anxious.     As per HPI. Otherwise, a complete review of systems is negative.  PAST MEDICAL HISTORY: Past Medical History:  Diagnosis Date  . Anxiety   . Arthritis   . Chronic lower back pain   . Crohn's disease ([place])   . Depression    . Difficult intubation    patient denies on 02/12/17  . GERD (gastroesophageal reflux disease)   . Gout   . History of kidney stones 40 years ago  . Hypercholesteremia   . Hypertension   . Prediabetes   . Sleep apnea    cpap     PAST SURGICAL HISTORY: Past Surgical History:  Procedure Laterality Date  . CHOLECYSTECTOMY N/A 11/03/2016   Procedure: LAPAROSCOPIC CHOLECYSTostomy with tube placement ;  Surgeon: Johnathan Hausen, MD;  Location: [facility];  Service: General;  Laterality: N/A;  . CHOLECYSTECTOMY N/A 02/15/2017   Procedure: LAPAROSCOPIC SUBTOTAL CHOLECYSTECTOMY, BIOPSY OF GALLBLADDER;  Surgeon: Johnathan Hausen, MD;  Location[facility];  Service: General;  Laterality: N/A;  . COLECTOMY  1973   "part of large intestines" (05/04/2012)  . COLON RESECTION    . COLONOSCOPY    . COLONOSCOPY N/A 05/23/2014   Procedure: COLONOSCOPY;  Surgeon: Rogene Houston, MD;  [facility];  Service: Endoscopy;  Laterality: N/A;  1200  . ESOPHAGEAL DILATION N/A 05/23/2014   Procedure: ESOPHAGEAL DILATION;  Surgeon: Rogene Houston, MD;  [facility];  Service: Endoscopy;  Laterality: N/A;  . ESOPHAGOGASTRODUODENOSCOPY N/A 03/16/2014   Procedure: esophageal dilation ;  Surgeon: Rogene Houston, MD;  [facility];  Service: Endoscopy;  Laterality: N/A;  . ESOPHAGOGASTRODUODENOSCOPY N/A 05/23/2014   Procedure: ESOPHAGOGASTRODUODENOSCOPY (EGD);  Surgeon: Rogene Houston, MD;  [facility];  Service: Endoscopy;  Laterality: N/A;  . ESOPHAGOGASTRODUODENOSCOPY (EGD) WITH PROPOFOL N/A 02/18/2017   Procedure: ESOPHAGOGASTRODUODENOSCOPY (EGD) WITH PROPOFOL;  Surgeon: Wonda Horner, MD;  [facility];  Service: Endoscopy;  Laterality: N/A;  . [place]  . IR CHOLANGIOGRAM EXISTING TUBE  11/26/2016  . KIDNEY STONE SURGERY  1970's  . PARTIAL COLECTOMY  ~ 40 years ago   some of large and small  . PORTA CATH INSERTION N/A 03/17/2017   Procedure:  PORTA CATH INSERTION;  Surgeon: Katha Cabal, MD;  Location: [facility];  Service: Cardiovascular;  Laterality: N/A;  . PROSTATE SURGERY    . THYROIDECTOMY Right 05/04/2012   Procedure: RIGHT HEMI THYROIDECTOMY;  Surgeon: Ascencion Dike, MD;  Location: [facility];  Service: ENT;  Laterality: Right;  . THYROIDECTOMY    . TONSILLECTOMY AND ADENOIDECTOMY     as a child  . TRANSURETHRAL PROSTATECTOMY WITH GYRUS INSTRUMENTS N/A 07/11/2012   Procedure: TRANSURETHRAL PROSTATECTOMY WITH GYRUS INSTRUMENTS;  Surgeon: Claybon Jabs, MD;  [facility];  Service: Urology;  Laterality: N/A;  . UPPER GASTROINTESTINAL ENDOSCOPY      FAMILY HISTORY: Family History  Problem Relation Age of Onset  . Diabetes Mother   . Stroke Father   . Healthy Daughter   . Healthy Son   . Heart disease Sister     ADVANCED DIRECTIVES (Y/N):  N  HEALTH MAINTENANCE: Social History   Tobacco Use  . Smoking status: Never Smoker  . Smokeless tobacco: Never Used  Substance Use Topics  . Alcohol use: No    Alcohol/week: 0.0 standard drinks  . Drug use: No     Colonoscopy:  PAP:  Bone density:  Lipid panel:  No Known Allergies  Current Outpatient Medications  Medication Sig Dispense Refill  . allopurinol (ZYLOPRIM) 300 MG tablet Take 1 tablet (300 mg total) by mouth every morning. 90 tablet 3  . amLODipine (NORVASC) 5 MG tablet TAKE 1 TABLET BY MOUTH  DAILY 90 tablet 3  . aspirin EC 81 MG tablet Take 81 mg by mouth at bedtime.    . fentaNYL (DURAGESIC - DOSED MCG/HR) 25 MCG/HR patch Place 1 patch (25 mcg total) onto the skin every 3 (three) days. 10 patch 0  . HYDROcodone-acetaminophen (NORCO/VICODIN) 5-325 MG tablet Take 1-2 tablets by mouth every 4 (four) hours as needed for moderate pain. 30 tablet 0  . lactulose (CHRONULAC) 10 GM/15ML solution Take 15 mLs (10 g total) by mouth daily. 240 mL 1  . lidocaine-prilocaine (EMLA) cream Apply to port 1 hours prior to chemotherapy appointment. Cover with  plastic wrap. 30 g 3  . losartan (COZAAR) 100 MG tablet Take 1 tablet (100 mg total) by mouth daily. 90 tablet 3  . megestrol (MEGACE) 40 MG tablet Take 1 tablet (40 mg total) by mouth daily. 30 tablet 2  . mesalamine (PENTASA) 500 MG CR capsule Take 2 capsules (1,000 mg total) by mouth 2 (two) times daily. 360 capsule 3  . metoprolol tartrate (LOPRESSOR) 50 MG tablet Take 1 tablet (50 mg total) by mouth 2 (two) times daily. 180 tablet 3  . Multiple Vitamin (MULTIVITAMIN WITH MINERALS) TABS tablet Take 2 tablets by mouth daily.    . ondansetron (ZOFRAN) 8 MG tablet Take 1 tablet (8 mg total) by mouth 2 (two) times daily. 30 tablet 3  . oxybutynin (DITROPAN-XL) 5 MG 24 hr tablet Take 1 tablet (5 mg total) by mouth at bedtime. 90 tablet 1  . pantoprazole (PROTONIX) 40 MG tablet Take 1 tablet (40 mg total) by mouth daily. 90 tablet 1  . Simethicone 180 MG CAPS Take 1 capsule (  180 mg total) by mouth 3 (three) times daily as needed.  0  . simvastatin (ZOCOR) 20 MG tablet Take 1 tablet (20 mg total) by mouth daily. 90 tablet 3  . vitamin B-12 (CYANOCOBALAMIN) 1000 MCG tablet Take 1,000 mcg by mouth 2 (two) times daily.    Marland Kitchen acetaminophen (TYLENOL) 500 MG tablet Take 1,000 mg by mouth every 4 (four) hours as needed for moderate pain or headache.     . bisacodyl (DULCOLAX) 10 MG suppository Place 1 suppository (10 mg total) rectally daily as needed for moderate constipation. (Patient not taking: Reported on 09/22/2017) 12 suppository 0  . fluticasone (FLONASE) 50 MCG/ACT nasal spray USE 1 SPRAY INTO BOTH  NOSTRILS DAILY (Patient not taking: Reported on 09/22/2017) 32 g 0  . Histamine Dihydrochloride (AUSTRALIAN DREAM ARTHRITIS EX) Apply 1 application topically 2 (two) times daily as needed (back pain).      No current facility-administered medications for this visit.    Facility-Administered Medications Ordered in Other Visits  Medication Dose Route Frequency Provider Last Rate Last Dose  . 0.9 %  sodium  chloride infusion (Manually program via Guardrails IV Fluids)  250 mL Intravenous Once Jacquelin Hawking, NP      . acetaminophen (TYLENOL) 325 MG tablet           . heparin lock flush 100 unit/mL  500 Units Intravenous Once Faythe Casa E, NP      . heparin lock flush 100 unit/mL  250 Units Intracatheter PRN Faythe Casa E, NP      . sodium chloride flush (NS) 0.9 % injection 10 mL  10 mL Intravenous PRN Jacquelin Hawking, NP   10 mL at 07/12/17 1445  . sodium chloride flush (NS) 0.9 % injection 3 mL  3 mL Intracatheter PRN Jacquelin Hawking, NP        OBJECTIVE: Vitals:   09/22/17 1059  BP: 133/70  Pulse: 76  Resp: 18  Temp: (!) 95.3 F (35.2 C)     Body mass index is 26.55 kg/m.    ECOG FS:1 - Symptomatic but completely ambulatory  General: Well-developed, well-nourished, no acute distress. Eyes: Pink conjunctiva, anicteric sclera. HEENT: Normocephalic, moist mucous membranes. Lungs: Clear to auscultation bilaterally. Heart: Regular rate and rhythm. No rubs, murmurs, or gallops. Abdomen: Soft, nontender, nondistended. No organomegaly noted, normoactive bowel sounds. Musculoskeletal: No edema, cyanosis, or clubbing. Neuro: Alert, answering all questions appropriately. Cranial nerves grossly intact. Skin: No rashes or petechiae noted. Psych: Normal affect.  LAB RESULTS:  Lab Results  Component Value Date   NA 133 (L) 09/22/2017   K 4.2 09/22/2017   CL 111 09/22/2017   CO2 18 (L) 09/22/2017   GLUCOSE 141 (H) 09/22/2017   BUN 35 (H) 09/22/2017   CREATININE 1.24 09/22/2017   CALCIUM 9.0 09/22/2017   PROT 6.9 09/22/2017   ALBUMIN 3.4 (L) 09/22/2017   AST 31 09/22/2017   ALT 28 09/22/2017   ALKPHOS 302 (H) 09/22/2017   BILITOT 0.6 09/22/2017   GFRNONAA 54 (L) 09/22/2017   GFRAA >60 09/22/2017    Lab Results  Component Value Date   WBC 1.5 (L) 09/22/2017   NEUTROABS 0.9 (L) 09/22/2017   HGB 7.8 (L) 09/22/2017   HCT 23.0 (L) 09/22/2017   MCV 102.5 (H)  09/22/2017   PLT 72 (L) 09/22/2017     STUDIES: No results found.  ASSESSMENT: Adenocarcinoma of gallbladder.  PLAN:    1. Adenocarcinoma of gallbladder: Restaging PET results from July 06, 2017 reviewed independently with improvement of disease burden.  Patient still has residual disease, therefore treatment was continued. Both gemcitabine and cisplatin have been dose reduced.  He continues to receive cisplatin and gemcitabine on day 1 and then gemcitabine only on days 8 and day 15.  It is apparent patient can only tolerate treatment on days 1 and 8 having missed a cycle 15 for the last 3 cycles.  No treatment today and return to clinic as scheduled for his PET scan on October 11, 2017 and then follow-up several days later to discuss the results.   2.  Poor appetite: Improved.  Appreciate dietary input.  Continue Megace as prescribed.   3.  Hyperglycemia: Patient has improved blood glucose control.  Monitor. 4.  Pain: Patient does not complain of this today.  Well controlled on his current narcotic regimen.  Continue fentanyl and hydrocodone as prescribed.  Patient was given a refill of his fentanyl patch today. 5.  Anemia: Patient's hemoglobin has trended down to 7.8 and he is symptomatic.  Return to clinic on Friday for 1 unit packed red blood cells. 6.  Thrombocytopenia: No treatment as above. 7.  Neutropenia: No treatment as above.   8.  Weakness and fatigue: Continue CARE program as ordered.  Patient expressed understanding and was in agreement with this plan. He also understands that He can call clinic at any time with any questions, concerns, or complaints.   Cancer Staging Adenocarcinoma of gallbladder [facility]) Staging form: Gallbladder, AJCC 8th Edition - Clinical stage from 03/03/2017: Stage IVB (cT3, cN0, cM1) - Signed by Lloyd Huger, MD on 03/23/2017   Lloyd Huger, MD   09/24/2017 7:15 PM

## 2017-09-22 ENCOUNTER — Inpatient Hospital Stay: Payer: Medicare Other

## 2017-09-22 ENCOUNTER — Inpatient Hospital Stay (HOSPITAL_BASED_OUTPATIENT_CLINIC_OR_DEPARTMENT_OTHER): Payer: Medicare Other | Admitting: Oncology

## 2017-09-22 ENCOUNTER — Other Ambulatory Visit: Payer: Self-pay

## 2017-09-22 VITALS — BP 133/70 | HR 76 | Temp 95.3°F | Resp 18 | Wt 179.8 lb

## 2017-09-22 DIAGNOSIS — C23 Malignant neoplasm of gallbladder: Secondary | ICD-10-CM | POA: Diagnosis not present

## 2017-09-22 DIAGNOSIS — Z79899 Other long term (current) drug therapy: Secondary | ICD-10-CM

## 2017-09-22 DIAGNOSIS — K219 Gastro-esophageal reflux disease without esophagitis: Secondary | ICD-10-CM

## 2017-09-22 DIAGNOSIS — F418 Other specified anxiety disorders: Secondary | ICD-10-CM

## 2017-09-22 DIAGNOSIS — E7800 Pure hypercholesterolemia, unspecified: Secondary | ICD-10-CM | POA: Diagnosis not present

## 2017-09-22 DIAGNOSIS — G4730 Sleep apnea, unspecified: Secondary | ICD-10-CM | POA: Diagnosis not present

## 2017-09-22 DIAGNOSIS — R948 Abnormal results of function studies of other organs and systems: Secondary | ICD-10-CM

## 2017-09-22 DIAGNOSIS — R531 Weakness: Secondary | ICD-10-CM | POA: Diagnosis not present

## 2017-09-22 DIAGNOSIS — K5900 Constipation, unspecified: Secondary | ICD-10-CM

## 2017-09-22 DIAGNOSIS — K5090 Crohn's disease, unspecified, without complications: Secondary | ICD-10-CM

## 2017-09-22 DIAGNOSIS — R739 Hyperglycemia, unspecified: Secondary | ICD-10-CM | POA: Diagnosis not present

## 2017-09-22 DIAGNOSIS — Z5111 Encounter for antineoplastic chemotherapy: Secondary | ICD-10-CM

## 2017-09-22 DIAGNOSIS — R5383 Other fatigue: Secondary | ICD-10-CM

## 2017-09-22 DIAGNOSIS — D649 Anemia, unspecified: Secondary | ICD-10-CM

## 2017-09-22 DIAGNOSIS — D61818 Other pancytopenia: Secondary | ICD-10-CM | POA: Diagnosis not present

## 2017-09-22 DIAGNOSIS — R630 Anorexia: Secondary | ICD-10-CM | POA: Diagnosis not present

## 2017-09-22 LAB — CBC WITH DIFFERENTIAL/PLATELET
BASOS PCT: 1 %
Basophils Absolute: 0 10*3/uL (ref 0–0.1)
EOS ABS: 0 10*3/uL (ref 0–0.7)
Eosinophils Relative: 0 %
HCT: 23 % — ABNORMAL LOW (ref 40.0–52.0)
Hemoglobin: 7.8 g/dL — ABNORMAL LOW (ref 13.0–18.0)
LYMPHS ABS: 0.5 10*3/uL — AB (ref 1.0–3.6)
Lymphocytes Relative: 36 %
MCH: 34.8 pg — AB (ref 26.0–34.0)
MCHC: 33.9 g/dL (ref 32.0–36.0)
MCV: 102.5 fL — AB (ref 80.0–100.0)
MONO ABS: 0.1 10*3/uL — AB (ref 0.2–1.0)
Monocytes Relative: 7 %
NEUTROS ABS: 0.9 10*3/uL — AB (ref 1.4–6.5)
NEUTROS PCT: 56 %
PLATELETS: 72 10*3/uL — AB (ref 150–440)
RBC: 2.24 MIL/uL — ABNORMAL LOW (ref 4.40–5.90)
RDW: 18.1 % — ABNORMAL HIGH (ref 11.5–14.5)
WBC: 1.5 10*3/uL — ABNORMAL LOW (ref 3.8–10.6)

## 2017-09-22 LAB — COMPREHENSIVE METABOLIC PANEL
ALBUMIN: 3.4 g/dL — AB (ref 3.5–5.0)
ALK PHOS: 302 U/L — AB (ref 38–126)
ALT: 28 U/L (ref 0–44)
AST: 31 U/L (ref 15–41)
Anion gap: 4 — ABNORMAL LOW (ref 5–15)
BUN: 35 mg/dL — ABNORMAL HIGH (ref 8–23)
CO2: 18 mmol/L — AB (ref 22–32)
CREATININE: 1.24 mg/dL (ref 0.61–1.24)
Calcium: 9 mg/dL (ref 8.9–10.3)
Chloride: 111 mmol/L (ref 98–111)
GFR calc Af Amer: >60 mL/min (ref >60)
GFR calc non Af Amer: 54 mL/min — ABNORMAL LOW (ref >60)
GLUCOSE: 141 mg/dL — AB (ref 70–99)
Potassium: 4.2 mmol/L (ref 3.5–5.1)
SODIUM: 133 mmol/L — AB (ref 135–145)
Total Bilirubin: 0.6 mg/dL (ref 0.3–1.2)
Total Protein: 6.9 g/dL (ref 6.5–8.1)

## 2017-09-22 LAB — SAMPLE TO BLOOD BANK

## 2017-09-22 MED ORDER — HEPARIN SOD (PORK) LOCK FLUSH 100 UNIT/ML IV SOLN
500.0000 [IU] | Freq: Once | INTRAVENOUS | Status: AC
  Administered 2017-09-22: 500 [IU] via INTRAVENOUS
  Filled 2017-09-22: qty 5

## 2017-09-22 MED ORDER — SODIUM CHLORIDE 0.9% FLUSH
10.0000 mL | Freq: Once | INTRAVENOUS | Status: AC
  Administered 2017-09-22: 10 mL via INTRAVENOUS
  Filled 2017-09-22: qty 10

## 2017-09-22 NOTE — Progress Notes (Signed)
Here for follow up. Pt noted to be weak and stated sob-pulse ox 100% on RA. Per pt having 6 bm y day-mushy consistency per pt -normally 3-4 per d. Feeling less sob after sitting 5 min. refd w/c assistance.

## 2017-09-23 ENCOUNTER — Other Ambulatory Visit: Payer: Self-pay | Admitting: *Deleted

## 2017-09-23 ENCOUNTER — Other Ambulatory Visit: Payer: Self-pay | Admitting: Oncology

## 2017-09-23 ENCOUNTER — Telehealth: Payer: Self-pay | Admitting: *Deleted

## 2017-09-23 DIAGNOSIS — D649 Anemia, unspecified: Secondary | ICD-10-CM

## 2017-09-23 LAB — PREPARE RBC (CROSSMATCH)

## 2017-09-23 NOTE — Telephone Encounter (Signed)
SDS can transfuse patient tomorrow morning at 8 AM Thomas David has entered orders and patient has been informed to be at [facility] by 7:30-7:45 so that he can be in SDS by 8 AM. I have also spoken with the blood bank to inform them that orders have been entered so that the crossmatch can be done and ready for patient tomorrow

## 2017-09-23 NOTE — Progress Notes (Signed)
Blood orders placed for 1 unit PRBC.  8 am tomorrow morning at same day surgery.   Faythe Casa, NP 09/23/2017 11:45 AM

## 2017-09-23 NOTE — Telephone Encounter (Signed)
Patient called and states that Thomas David was to call him and let him know if he is to get a blood transfusion. He is weak. Please return his call or advise.

## 2017-09-23 NOTE — Telephone Encounter (Signed)
We do not have any space in infusion for add-ons for Friday.

## 2017-09-23 NOTE — Telephone Encounter (Signed)
Infusion cannot add him today or tomorrow can he be set up in SDS for this?

## 2017-09-23 NOTE — Telephone Encounter (Signed)
He can have 1 unit of blood if desired.  [facility] if needed.

## 2017-09-23 NOTE — Telephone Encounter (Signed)
Patient would like  to have blood transfusion ASAP

## 2017-09-24 ENCOUNTER — Ambulatory Visit
Admission: RE | Admit: 2017-09-24 | Discharge: 2017-09-24 | Disposition: A | Discharge status: Home / Self Care | Payer: Medicare Other | Source: Ambulatory Visit | Attending: Oncology | Admitting: Oncology

## 2017-09-24 DIAGNOSIS — D649 Anemia, unspecified: Secondary | ICD-10-CM | POA: Insufficient documentation

## 2017-09-24 MED ORDER — ACETAMINOPHEN 325 MG PO TABS
ORAL_TABLET | ORAL | Status: AC
  Filled 2017-09-24: qty 2

## 2017-09-24 MED ORDER — DIPHENHYDRAMINE HCL 25 MG PO CAPS
ORAL_CAPSULE | ORAL | Status: AC
  Administered 2017-09-24: 25 mg via ORAL
  Filled 2017-09-24: qty 1

## 2017-09-24 MED ORDER — HEPARIN SOD (PORK) LOCK FLUSH 100 UNIT/ML IV SOLN
500.0000 [IU] | Freq: Every day | INTRAVENOUS | Status: AC | PRN
  Administered 2017-09-24: 500 [IU]

## 2017-09-24 MED ORDER — ACETAMINOPHEN 325 MG PO TABS
650.0000 mg | ORAL_TABLET | Freq: Once | ORAL | Status: AC
  Administered 2017-09-24: 650 mg via ORAL

## 2017-09-24 MED ORDER — ACETAMINOPHEN 325 MG PO TABS
ORAL_TABLET | ORAL | Status: AC
  Filled 2017-09-24: qty 1

## 2017-09-24 MED ORDER — DIPHENHYDRAMINE HCL 25 MG PO CAPS
25.0000 mg | ORAL_CAPSULE | Freq: Once | ORAL | Status: AC
  Administered 2017-09-24: 25 mg via ORAL

## 2017-09-24 MED ORDER — HEPARIN SOD (PORK) LOCK FLUSH 100 UNIT/ML IV SOLN
INTRAVENOUS | Status: AC
  Administered 2017-09-24: 500 [IU]
  Filled 2017-09-24: qty 5

## 2017-09-24 MED ORDER — SODIUM CHLORIDE FLUSH 0.9 % IV SOLN
INTRAVENOUS | Status: AC
  Administered 2017-09-24: 10 mL
  Filled 2017-09-24: qty 10

## 2017-09-24 MED ORDER — HEPARIN SOD (PORK) LOCK FLUSH 100 UNIT/ML IV SOLN: 250.0000 [IU] | INTRAVENOUS | Status: DC | PRN

## 2017-09-24 MED ORDER — SODIUM CHLORIDE 0.9% IV SOLUTION: 250.0000 mL | Freq: Once | INTRAVENOUS | Status: DC

## 2017-09-24 MED ORDER — SODIUM CHLORIDE 0.9% FLUSH
10.0000 mL | INTRAVENOUS | Status: AC | PRN
  Administered 2017-09-24: 10 mL

## 2017-09-24 MED ORDER — FENTANYL 25 MCG/HR TD PT72: 25.0000 ug | MEDICATED_PATCH | TRANSDERMAL | 0 refills | Status: DC

## 2017-09-24 MED ORDER — SODIUM CHLORIDE 0.9% FLUSH: 3.0000 mL | INTRAVENOUS | Status: DC | PRN

## 2017-09-25 LAB — TYPE AND SCREEN
ABO/RH(D): O POS
Antibody Screen: NEGATIVE
UNIT DIVISION: 0

## 2017-09-25 LAB — BPAM RBC
Blood Product Expiration Date: 201909082359
ISSUE DATE / TIME: 201908230817
Unit Type and Rh: 5100

## 2017-09-28 ENCOUNTER — Encounter: Payer: Self-pay | Admitting: Family Medicine

## 2017-09-28 ENCOUNTER — Ambulatory Visit (INDEPENDENT_AMBULATORY_CARE_PROVIDER_SITE_OTHER): Payer: Medicare Other | Admitting: Family Medicine

## 2017-09-28 VITALS — BP 104/60 | HR 87 | Temp 97.9°F | Ht 69.0 in | Wt 180.2 lb

## 2017-09-28 DIAGNOSIS — R350 Frequency of micturition: Secondary | ICD-10-CM

## 2017-09-28 DIAGNOSIS — M109 Gout, unspecified: Secondary | ICD-10-CM

## 2017-09-28 DIAGNOSIS — D649 Anemia, unspecified: Secondary | ICD-10-CM

## 2017-09-28 DIAGNOSIS — K5909 Other constipation: Secondary | ICD-10-CM | POA: Diagnosis not present

## 2017-09-28 DIAGNOSIS — E119 Type 2 diabetes mellitus without complications: Secondary | ICD-10-CM | POA: Diagnosis not present

## 2017-09-28 DIAGNOSIS — C23 Malignant neoplasm of gallbladder: Secondary | ICD-10-CM

## 2017-09-28 LAB — POCT URINALYSIS DIPSTICK
Bilirubin, UA: NEGATIVE
Blood, UA: NEGATIVE
Glucose, UA: NEGATIVE
Ketones, UA: NEGATIVE
LEUKOCYTES UA: NEGATIVE
Nitrite, UA: NEGATIVE
PH UA: 5 (ref 5.0–8.0)
PROTEIN UA: POSITIVE — AB
Spec Grav, UA: 1.01 (ref 1.010–1.025)
Urobilinogen, UA: 0.2 E.U./dL

## 2017-09-28 LAB — CBC WITH DIFFERENTIAL/PLATELET
BASOS ABS: 0 10*3/uL (ref 0.0–0.1)
Basophils Relative: 0.3 % (ref 0.0–3.0)
Eosinophils Absolute: 0 10*3/uL (ref 0.0–0.7)
Eosinophils Relative: 0.8 % (ref 0.0–5.0)
HEMATOCRIT: 28.7 % — AB (ref 39.0–52.0)
HEMOGLOBIN: 9.8 g/dL — AB (ref 13.0–17.0)
LYMPHS PCT: 26.4 % (ref 12.0–46.0)
Lymphs Abs: 1.2 10*3/uL (ref 0.7–4.0)
MCHC: 34.1 g/dL (ref 30.0–36.0)
MCV: 99.7 fl (ref 78.0–100.0)
Monocytes Absolute: 1.9 10*3/uL — ABNORMAL HIGH (ref 0.1–1.0)
Neutro Abs: 1.4 10*3/uL (ref 1.4–7.7)
Neutrophils Relative %: 30.3 % — ABNORMAL LOW (ref 43.0–77.0)
Platelets: 227 10*3/uL (ref 150.0–400.0)
RBC: 2.88 Mil/uL — AB (ref 4.22–5.81)
RDW: 18.3 % — ABNORMAL HIGH (ref 11.5–15.5)
WBC: 4.5 10*3/uL (ref 4.0–10.5)

## 2017-09-28 LAB — HEMOGLOBIN A1C: HEMOGLOBIN A1C: 6.9 % — AB (ref 4.6–6.5)

## 2017-09-28 NOTE — Progress Notes (Signed)
  Tommi Rumps, MD Phone: [phone]  Thomas David is a 82 y.o. male who presents today for f/u.  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  He has undergone surgical intervention for this and is currently on chemotherapy.  Last week he could not do the chemotherapy due to his blood counts.  He did not feel great.  He had dyspnea on exertion at that time and was found to be more anemic than previously.  He received a transfusion on Friday and notes his dyspnea has improved some since then though he still has some mild dyspnea.  No chest pain.  He contacted the cancer center on Monday and they advised him to contact them on Wednesday if he was still not feeling any better.  Patient reports he not had a gout flare in 10 years.  He wants to come off of the allopurinol.  He had not had many gout flares prior to that.  Diabetes: He does note some increased urination no known polydipsia.  He has some nocturia.  He is on Ditropan to help with that and it does help some.  Occasional discomfort in his lower suprapubic area if he does not drink enough fluids.  No dysuria or urgency.    He does have intermittent issues with constipation alternating with diarrhea since he has been on chemotherapy and since his gallbladder has been removed.  Advised to stop MiraLAX recently and that has helped some.  Social History   Tobacco Use  Smoking Status Never Smoker  Smokeless Tobacco Never Used     ROS see history of present illness  Objective  Physical Exam Vitals:   09/28/17 1031  BP: 104/60  Pulse: 87  Temp: 97.9 F (36.6 C)  SpO2: 99%    BP Readings from Last 3 Encounters:  09/28/17 104/60  09/22/17 133/70  09/08/17 109/73   Wt Readings from Last 3 Encounters:  09/28/17 180 lb 3.2 oz (81.7 kg)  09/22/17 179 lb 12.8 oz (81.6 kg)  09/08/17 180 lb 6 oz (81.8 kg)    Physical  Exam  Constitutional: No distress.  Cardiovascular: Normal rate, regular rhythm and normal heart sounds.  Pulmonary/Chest: Effort normal and breath sounds normal.  Abdominal: Soft. Bowel sounds are normal. He exhibits no distension. There is no tenderness.  Musculoskeletal: He exhibits no edema.  Neurological: He is alert.  Skin: Skin is warm and dry. He is not diaphoretic.     Assessment/Plan: Please see individual problem list.  Adenocarcinoma of gallbladder [facility]) Patient will continue to follow with oncology.  Diabetes ([place]) Urinary frequency could be related to diabetes.  Check A1c.  Check urinalysis.  Anemia Symptoms have improved with transfusion.  We will recheck a hemoglobin.  If he does not continue to feel improved he will contact the cancer center.  Constipation Intermittent issues with this.  He will monitor currently.  He will discuss with his GI physician.  Gout He will discontinue allopurinol per his request.    Orders Placed This Encounter  Procedures  . HgB A1c  . CBC w/Diff  . POCT Urinalysis Dipstick    No orders of the defined types were placed in this encounter.    Tommi Rumps, MD [place]

## 2017-09-28 NOTE — Patient Instructions (Signed)
Nice to see you. We will check lab work today to help determine if you need another blood transfusion. We will check your urine as well. Please discuss your alternating constipation and diarrhea with your GI physician.

## 2017-09-30 DIAGNOSIS — D649 Anemia, unspecified: Secondary | ICD-10-CM | POA: Insufficient documentation

## 2017-09-30 NOTE — Assessment & Plan Note (Signed)
Intermittent issues with this.  He will monitor currently.  He will discuss with his GI physician.

## 2017-09-30 NOTE — Assessment & Plan Note (Signed)
Patient will continue to follow with oncology.

## 2017-09-30 NOTE — Assessment & Plan Note (Signed)
He will discontinue allopurinol per his request.

## 2017-09-30 NOTE — Assessment & Plan Note (Signed)
Symptoms have improved with transfusion.  We will recheck a hemoglobin.  If he does not continue to feel improved he will contact the cancer center.

## 2017-09-30 NOTE — Assessment & Plan Note (Signed)
Urinary frequency could be related to diabetes.  Check A1c.  Check urinalysis.

## 2017-10-02 ENCOUNTER — Other Ambulatory Visit: Payer: Self-pay | Admitting: Family Medicine

## 2017-10-02 DIAGNOSIS — R809 Proteinuria, unspecified: Secondary | ICD-10-CM

## 2017-10-10 NOTE — Progress Notes (Signed)
[place]  [phone] [phone]  ID: Thomas David OB: 01/04/1936  MR#: [id number]  SPQ#:[id number]  Patient Care Team: Leone Haven, MD as PCP - General (Family Medicine) Kathyrn Drown, MD (Family Medicine) Clent Jacks, RN as Registered Nurse  CHIEF COMPLAINT: Adenocarcinoma of gallbladder.  INTERVAL HISTORY: Patient returns to clinic today for further evaluation, discussion of his imaging results, and treatment planning.  He feels significantly improved after having several weeks off of chemotherapy.  He continues to have chronic weakness and fatigue.  He does not complain of dyspnea on exertion today.  He has a fair appetite. He does not complain of constipation or diarrhea today.  He does not complain of pain today.  He has no neurologic complaints.  He denies any recent fevers or illnesses. He denies any chest pain.  He has no urinary complaints.  Patient offers no further specific complaints today.  REVIEW OF SYSTEMS:   Review of Systems  Constitutional: Positive for malaise/fatigue. Negative for fever and weight loss.  Eyes: Negative for blurred vision.  Respiratory: Negative.  Negative for cough and shortness of breath.   Cardiovascular: Negative.  Negative for chest pain and leg swelling.  Gastrointestinal: Negative.  Negative for abdominal pain, blood in stool, constipation, heartburn, melena and nausea.  Genitourinary: Negative.  Negative for dysuria and frequency.  Musculoskeletal: Negative.  Negative for falls.  Skin: Negative.  Negative for rash.  Neurological: Positive for weakness. Negative for sensory change and focal weakness.  Psychiatric/Behavioral: Negative.  The patient is not nervous/anxious.     As per HPI. Otherwise, a complete review of systems is negative.  PAST MEDICAL HISTORY: Past Medical History:  Diagnosis Date  . Anxiety   . Arthritis   . Chronic lower back pain   . Crohn's disease ([place])     . Depression   . Difficult intubation    patient denies on 02/12/17  . GERD (gastroesophageal reflux disease)   . Gout   . History of kidney stones 40 years ago  . Hypercholesteremia   . Hypertension   . Prediabetes   . Sleep apnea    cpap     PAST SURGICAL HISTORY: Past Surgical History:  Procedure Laterality Date  . CHOLECYSTECTOMY N/A 11/03/2016   Procedure: LAPAROSCOPIC CHOLECYSTostomy with tube placement ;  Surgeon: Johnathan Hausen, MD;  Location: [facility];  Service: General;  Laterality: N/A;  . CHOLECYSTECTOMY N/A 02/15/2017   Procedure: LAPAROSCOPIC SUBTOTAL CHOLECYSTECTOMY, BIOPSY OF GALLBLADDER;  Surgeon: Johnathan Hausen, MD;  Location: [facility];  Service: General;  Laterality: N/A;  . COLECTOMY  1973   "part of large intestines" (05/04/2012)  . COLON RESECTION    . COLONOSCOPY    . COLONOSCOPY N/A 05/23/2014   Procedure: COLONOSCOPY;  Surgeon: Rogene Houston, MD;  [facility];  Service: Endoscopy;  Laterality: N/A;  1200  . ESOPHAGEAL DILATION N/A 05/23/2014   Procedure: ESOPHAGEAL DILATION;  Surgeon: Rogene Houston, MD;  [facility];  Service: Endoscopy;  Laterality: N/A;  . ESOPHAGOGASTRODUODENOSCOPY N/A 03/16/2014   Procedure: esophageal dilation ;  Surgeon: Rogene Houston, MD;  [facility];  Service: Endoscopy;  Laterality: N/A;  . ESOPHAGOGASTRODUODENOSCOPY N/A 05/23/2014   Procedure: ESOPHAGOGASTRODUODENOSCOPY (EGD);  Surgeon: Rogene Houston, MD;  [facility];  Service: Endoscopy;  Laterality: N/A;  . ESOPHAGOGASTRODUODENOSCOPY (EGD) WITH PROPOFOL N/A 02/18/2017   Procedure: ESOPHAGOGASTRODUODENOSCOPY (EGD) WITH PROPOFOL;  Surgeon: Wonda Horner, MD;  Location[facility];  Service: Endoscopy;  Laterality: N/A;  . [place]  . IR CHOLANGIOGRAM EXISTING TUBE  11/26/2016  . KIDNEY STONE SURGERY  1970's  . PARTIAL COLECTOMY  ~ 40 years ago   some of large and small  . PORTA CATH INSERTION N/A 03/17/2017    Procedure: PORTA CATH INSERTION;  Surgeon: Katha Cabal, MD;  Location: [facility];  Service: Cardiovascular;  Laterality: N/A;  . PROSTATE SURGERY    . THYROIDECTOMY Right 05/04/2012   Procedure: RIGHT HEMI THYROIDECTOMY;  Surgeon: Ascencion Dike, MD;  Location: [facility];  Service: ENT;  Laterality: Right;  . THYROIDECTOMY    . TONSILLECTOMY AND ADENOIDECTOMY     as a child  . TRANSURETHRAL PROSTATECTOMY WITH GYRUS INSTRUMENTS N/A 07/11/2012   Procedure: TRANSURETHRAL PROSTATECTOMY WITH GYRUS INSTRUMENTS;  Surgeon: Claybon Jabs, MD;  Location: [facility];  Service: Urology;  Laterality: N/A;  . UPPER GASTROINTESTINAL ENDOSCOPY      FAMILY HISTORY: Family History  Problem Relation Age of Onset  . Diabetes Mother   . Stroke Father   . Healthy Daughter   . Healthy Son   . Heart disease Sister     ADVANCED DIRECTIVES (Y/N):  N  HEALTH MAINTENANCE: Social History   Tobacco Use  . Smoking status: Never Smoker  . Smokeless tobacco: Never Used  Substance Use Topics  . Alcohol use: No    Alcohol/week: 0.0 standard drinks  . Drug use: No     Colonoscopy:  PAP:  Bone density:  Lipid panel:  No Known Allergies  Current Outpatient Medications  Medication Sig Dispense Refill  . acetaminophen (TYLENOL) 500 MG tablet Take 1,000 mg by mouth every 4 (four) hours as needed for moderate pain or headache.     . allopurinol (ZYLOPRIM) 300 MG tablet Take 1 tablet (300 mg total) by mouth every morning. 90 tablet 3  . amLODipine (NORVASC) 5 MG tablet TAKE 1 TABLET BY MOUTH  DAILY 90 tablet 3  . aspirin EC 81 MG tablet Take 81 mg by mouth at bedtime.    . bisacodyl (DULCOLAX) 10 MG suppository Place 1 suppository (10 mg total) rectally daily as needed for moderate constipation. 12 suppository 0  . fentaNYL (DURAGESIC - DOSED MCG/HR) 25 MCG/HR patch Place 1 patch (25 mcg total) onto the skin every 3 (three) days. 10 patch 0  . fluticasone (FLONASE) 50 MCG/ACT nasal spray USE 1 SPRAY INTO  BOTH  NOSTRILS DAILY 32 g 0  . Histamine Dihydrochloride (AUSTRALIAN DREAM ARTHRITIS EX) Apply 1 application topically 2 (two) times daily as needed (back pain).     Marland Kitchen lactulose (CHRONULAC) 10 GM/15ML solution Take 15 mLs (10 g total) by mouth daily. 240 mL 1  . lidocaine-prilocaine (EMLA) cream Apply to port 1 hours prior to chemotherapy appointment. Cover with plastic wrap. 30 g 3  . losartan (COZAAR) 100 MG tablet Take 1 tablet (100 mg total) by mouth daily. 90 tablet 3  . megestrol (MEGACE) 40 MG tablet Take 1 tablet (40 mg total) by mouth daily. 30 tablet 2  . mesalamine (PENTASA) 500 MG CR capsule Take 2 capsules (1,000 mg total) by mouth 2 (two) times daily. 360 capsule 3  . metoprolol tartrate (LOPRESSOR) 50 MG tablet Take 1 tablet (50 mg total) by mouth 2 (two) times daily. 180 tablet 3  . Multiple Vitamin (MULTIVITAMIN WITH MINERALS) TABS tablet Take 2 tablets by mouth daily.    . pantoprazole (PROTONIX) 40  MG tablet Take 1 tablet (40 mg total) by mouth daily. 90 tablet 1  . Simethicone 180 MG CAPS Take 1 capsule (180 mg total) by mouth 3 (three) times daily as needed.  0  . simvastatin (ZOCOR) 20 MG tablet Take 1 tablet (20 mg total) by mouth daily. 90 tablet 3  . vitamin B-12 (CYANOCOBALAMIN) 1000 MCG tablet Take 1,000 mcg by mouth 2 (two) times daily.    Marland Kitchen HYDROcodone-acetaminophen (NORCO/VICODIN) 5-325 MG tablet Take 1-2 tablets by mouth every 4 (four) hours as needed for moderate pain. (Patient not taking: Reported on 10/13/2017) 30 tablet 0  . ondansetron (ZOFRAN) 8 MG tablet Take 1 tablet (8 mg total) by mouth 2 (two) times daily. (Patient not taking: Reported on 10/13/2017) 30 tablet 3  . oxybutynin (DITROPAN-XL) 5 MG 24 hr tablet TAKE 1 TABLET BY MOUTH AT  BEDTIME 90 tablet 1   No current facility-administered medications for this visit.    Facility-Administered Medications Ordered in Other Visits  Medication Dose Route Frequency Provider Last Rate Last Dose  . heparin lock  flush 100 unit/mL  500 Units Intravenous Once Faythe Casa E, NP      . sodium chloride flush (NS) 0.9 % injection 10 mL  10 mL Intravenous PRN Jacquelin Hawking, NP   10 mL at 07/12/17 1445    OBJECTIVE: Vitals:   10/13/17 1136  BP: 132/80  Pulse: 83  Resp: 18  Temp: 97.6 F (36.4 C)  SpO2: 98%     Body mass index is 26.66 kg/m.    ECOG FS:1 - Symptomatic but completely ambulatory  General: Well-developed, well-nourished, no acute distress. Eyes: Pink conjunctiva, anicteric sclera. HEENT: Normocephalic, moist mucous membranes. Lungs: Clear to auscultation bilaterally. Heart: Regular rate and rhythm. No rubs, murmurs, or gallops. Abdomen: Soft, nontender, nondistended. No organomegaly noted, normoactive bowel sounds. Musculoskeletal: No edema, cyanosis, or clubbing. Neuro: Alert, answering all questions appropriately. Cranial nerves grossly intact. Skin: No rashes or petechiae noted. Psych: Normal affect.  LAB RESULTS:  Lab Results  Component Value Date   NA 135 10/15/2017   K 4.4 10/15/2017   CL 107 10/15/2017   CO2 20 (L) 10/15/2017   GLUCOSE 144 (H) 10/15/2017   BUN 33 (H) 10/15/2017   CREATININE 1.31 (H) 10/15/2017   CALCIUM 9.0 10/15/2017   PROT 7.2 10/15/2017   ALBUMIN 3.3 (L) 10/15/2017   AST 28 10/15/2017   ALT 20 10/15/2017   ALKPHOS 284 (H) 10/15/2017   BILITOT 0.7 10/15/2017   GFRNONAA 50 (L) 10/15/2017   GFRAA 58 (L) 10/15/2017    Lab Results  Component Value Date   WBC 9.0 10/15/2017   NEUTROABS 6.6 (H) 10/15/2017   HGB 9.4 (L) 10/15/2017   HCT 28.0 (L) 10/15/2017   MCV 100.0 10/15/2017   PLT 237 10/15/2017     STUDIES: Nm Pet Image Restag (ps) Skull Base To Thigh  Result Date: 10/11/2017 CLINICAL DATA:  Subsequent treatment strategy for gallbladder adenocarcinoma. EXAM: NUCLEAR MEDICINE PET SKULL BASE TO THIGH TECHNIQUE: 10.0 mCi F-18 FDG was injected intravenously. Full-ring PET imaging was performed from the skull base to thigh after  the radiotracer. CT data was obtained and used for attenuation correction and anatomic localization. Fasting blood glucose: 91 mg/dl COMPARISON:  Multiple exams, including 07/06/2017 FINDINGS: Mediastinal blood pool activity: SUV max 1.8 NECK: No significant abnormal hypermetabolic activity in this region. Incidental CT findings: Atherosclerotic calcification of the common carotid arteries. CHEST: A left supraclavicular node measuring 0.7 cm in short  axis on image 66/3 has a maximum SUV 2.6. Right lower paratracheal node measuring 1.0 cm in short axis on image 97/3 has a maximum SUV of 3.7, formerly 2.3. Indistinct activity at the right hilum, maximum SUV 2.8. Diffuse esophageal activity noted, activity in the mid esophagus with maximum SUV of 6.3. This appearance could be from esophagitis or physiologic. Given the long segment of involvement, esophageal cancer is unlikely. Chronic volume loss in the right upper lobe adjacent to the minor fissure without significant accentuated metabolic activity, unchanged from recent chest CT. 6 by 4 mm right upper lobe pulmonary nodule on image 82/3, no appreciable accentuated metabolic activity but below sensitive PET-CT size thresholds. 6 by 7 mm left lower lobe nodule on image 131/3, previously 4 by 5 mm on 07/12/2017, maximum SUV 1.4. Incidental CT findings: Coronary, aortic arch, and branch vessel atherosclerotic vascular disease. Mild cardiomegaly. Right Port-A-Cath tip: Cavoatrial junction. ABDOMEN/PELVIS: Worsening omental caking of tumor with worsening tumor nodularity in the left upper quadrant omentum as well, and worsening right adrenal metastatic lesion. The right upper quadrant omental caking has a maximum SUV of 6.2 (formerly about 4.3) and has a more solid appearance, measuring about 1.9 cm in width on image 180/3, previously 1.4 cm. The overall right adrenal mass measures 4.9 by 4.6 cm on image 149/3 (formerly 4.5 by 4.2 cm) with maximum SUV 10.6 (formerly 5.8).  New hypermetabolic nodularity in the left upper quadrant has a maximum SUV of 4.0. There is a new tumor nodule in the left inguinal hernia with maximum SUV 4.3. New perisplenic and mild perihepatic ascites along with increased ascites in the paracolic gutters, probably malignant. High anorectal activity is most likely physiologic, strictly speaking anal malignancy cannot be excluded. Incidental CT findings: Bilateral renal cysts of varying complexity but are not hypermetabolic and accordingly favor benign cysts. Diffuse mesenteric edema, increased. Aortoiliac atherosclerotic vascular disease. Considerable prostatomegaly. Bilateral inguinal hernias contain adipose tissue, larger on the left the right, and the left likewise contains a small amount of tumor as detailed above. SKELETON: 7 mm small focus of sclerosis posteriorly in the T10 vertebral body with maximum SUV 3.9. Incidental CT findings: Bridging spurring of both sacroiliac joints. Degenerative arthropathy of both hips. IMPRESSION: 1. Significant worsening of omental caking of tumor with increase in solidity and activity in the dominant right upper quadrant omental caking; increased nodularity in the left upper quadrant omentum; and increase in ascites and mesenteric edema. 2. Significant increase in size and metabolic activity of the right adrenal metastatic lesion. 3. Mild left supraclavicular, and right paratracheal adenopathy in the chest could reflect early metastatic spread. Small pulmonary nodules are not appreciably hypermetabolic but are below sensitive PET-CT size thresholds; a left lower lobe nodule in particular on image 131/3 appears to have mildly enlarged compared to the prior exam, concerning for early metastatic disease and meriting surveillance. 4. There bilateral inguinal hernias; the left inguinal hernia contains a small hypermetabolic tumor nodule. 5. 7 mm small focus of sclerosis posteriorly in the T10 vertebral body is mildly  hypermetabolic and likewise could represent a metastatic lesion. 6. Other imaging findings of potential clinical significance: Aortic Atherosclerosis (ICD10-I70.0). Coronary atherosclerosis with mild cardiomegaly. Bilateral renal cysts of varying complexity appear benign. Prostatomegaly. Electronically Signed   By: Van Clines M.D.   On: 10/11/2017 12:08    ASSESSMENT: Adenocarcinoma of gallbladder.  PLAN:    1. Adenocarcinoma of gallbladder: PET scan results from October 11, 2017 reviewed independently and report as above with  significant progression of disease.  After lengthy discussion with the patient and his family which included hospice and end-of-life care, patient is unclear if he wishes to pursue second line treatment.  Options presented to the patient were FOLFOX, CAPEOX, and Keytruda if MSI is high.  MSI results are currently pending.  Patient will call clinic in the next week after discussing with his family what he wants to pursue.  Follow-up will depend on whether he wishes palliative treatment or possibly hospice in the near future.  2.  Hospice: If patient chooses no further treatment, he likely does not require hospice at this time but will need services in the near future. 3.  Poor appetite: Improved.  Appreciate dietary input.  Continue Megace as prescribed.   3.  Hyperglycemia: Patient continues to have improved control.  Monitor.   4.  Pain: Patient does not complain of this today.  Well controlled on his current narcotic regimen.  Continue fentanyl and hydrocodone as prescribed.  5.  Anemia: Patient's hemoglobin has significantly improved to 9.2 with 1 unit of packed red blood cells.  He does not require further transfusion today.   6.  Thrombocytopenia: Resolved. 7.  Neutropenia: Resolved. 8.  Weakness and fatigue: Continue CARE program as ordered. 9.  Hyperkalemia: Mild.  Encouraged increased fluid intake.  Patient expressed understanding and was in agreement with  this plan. He also understands that He can call clinic at any time with any questions, concerns, or complaints.   Cancer Staging Adenocarcinoma of gallbladder [facility]) Staging form: Gallbladder, AJCC 8th Edition - Clinical stage from 03/03/2017: Stage IVB (cT3, cN0, cM1) - Signed by Lloyd Huger, MD on 03/23/2017   Lloyd Huger, MD   10/15/2017 5:49 PM

## 2017-10-11 ENCOUNTER — Encounter
Admission: RE | Admit: 2017-10-11 | Discharge: 2017-10-11 | Disposition: A | Discharge status: Home / Self Care | Payer: Medicare Other | Source: Ambulatory Visit | Attending: Oncology | Admitting: Oncology

## 2017-10-11 DIAGNOSIS — C23 Malignant neoplasm of gallbladder: Secondary | ICD-10-CM | POA: Diagnosis not present

## 2017-10-11 LAB — GLUCOSE, CAPILLARY: Glucose-Capillary: 91 mg/dL (ref 70–99)

## 2017-10-11 MED ORDER — FLUDEOXYGLUCOSE F - 18 (FDG) INJECTION
9.3000 | Freq: Once | INTRAVENOUS | Status: AC | PRN
  Administered 2017-10-11: 10 via INTRAVENOUS

## 2017-10-13 ENCOUNTER — Inpatient Hospital Stay: Payer: Medicare Other | Attending: Oncology

## 2017-10-13 ENCOUNTER — Inpatient Hospital Stay (HOSPITAL_BASED_OUTPATIENT_CLINIC_OR_DEPARTMENT_OTHER): Payer: Medicare Other | Admitting: Oncology

## 2017-10-13 ENCOUNTER — Other Ambulatory Visit: Payer: Self-pay

## 2017-10-13 ENCOUNTER — Other Ambulatory Visit: Payer: Self-pay | Admitting: Family Medicine

## 2017-10-13 VITALS — BP 132/80 | HR 83 | Temp 97.6°F | Resp 18 | Wt 180.6 lb

## 2017-10-13 DIAGNOSIS — C7971 Secondary malignant neoplasm of right adrenal gland: Secondary | ICD-10-CM

## 2017-10-13 DIAGNOSIS — M109 Gout, unspecified: Secondary | ICD-10-CM | POA: Diagnosis not present

## 2017-10-13 DIAGNOSIS — R531 Weakness: Secondary | ICD-10-CM | POA: Diagnosis not present

## 2017-10-13 DIAGNOSIS — Z5111 Encounter for antineoplastic chemotherapy: Secondary | ICD-10-CM | POA: Insufficient documentation

## 2017-10-13 DIAGNOSIS — I10 Essential (primary) hypertension: Secondary | ICD-10-CM

## 2017-10-13 DIAGNOSIS — C23 Malignant neoplasm of gallbladder: Secondary | ICD-10-CM

## 2017-10-13 DIAGNOSIS — K5730 Diverticulosis of large intestine without perforation or abscess without bleeding: Secondary | ICD-10-CM | POA: Insufficient documentation

## 2017-10-13 DIAGNOSIS — K5090 Crohn's disease, unspecified, without complications: Secondary | ICD-10-CM | POA: Insufficient documentation

## 2017-10-13 DIAGNOSIS — E7800 Pure hypercholesterolemia, unspecified: Secondary | ICD-10-CM

## 2017-10-13 DIAGNOSIS — G4730 Sleep apnea, unspecified: Secondary | ICD-10-CM | POA: Diagnosis not present

## 2017-10-13 DIAGNOSIS — I2510 Atherosclerotic heart disease of native coronary artery without angina pectoris: Secondary | ICD-10-CM | POA: Diagnosis not present

## 2017-10-13 DIAGNOSIS — M545 Low back pain: Secondary | ICD-10-CM | POA: Diagnosis not present

## 2017-10-13 DIAGNOSIS — E875 Hyperkalemia: Secondary | ICD-10-CM | POA: Insufficient documentation

## 2017-10-13 DIAGNOSIS — E860 Dehydration: Secondary | ICD-10-CM | POA: Insufficient documentation

## 2017-10-13 DIAGNOSIS — Z7982 Long term (current) use of aspirin: Secondary | ICD-10-CM | POA: Insufficient documentation

## 2017-10-13 DIAGNOSIS — E1165 Type 2 diabetes mellitus with hyperglycemia: Secondary | ICD-10-CM

## 2017-10-13 DIAGNOSIS — R188 Other ascites: Secondary | ICD-10-CM | POA: Insufficient documentation

## 2017-10-13 DIAGNOSIS — Z9049 Acquired absence of other specified parts of digestive tract: Secondary | ICD-10-CM | POA: Diagnosis not present

## 2017-10-13 DIAGNOSIS — N281 Cyst of kidney, acquired: Secondary | ICD-10-CM | POA: Diagnosis not present

## 2017-10-13 DIAGNOSIS — R5383 Other fatigue: Secondary | ICD-10-CM | POA: Insufficient documentation

## 2017-10-13 DIAGNOSIS — Z79899 Other long term (current) drug therapy: Secondary | ICD-10-CM

## 2017-10-13 DIAGNOSIS — R000 Tachycardia, unspecified: Secondary | ICD-10-CM | POA: Insufficient documentation

## 2017-10-13 DIAGNOSIS — D649 Anemia, unspecified: Secondary | ICD-10-CM | POA: Diagnosis not present

## 2017-10-13 DIAGNOSIS — R918 Other nonspecific abnormal finding of lung field: Secondary | ICD-10-CM | POA: Diagnosis not present

## 2017-10-13 DIAGNOSIS — F418 Other specified anxiety disorders: Secondary | ICD-10-CM | POA: Diagnosis not present

## 2017-10-13 DIAGNOSIS — R112 Nausea with vomiting, unspecified: Secondary | ICD-10-CM | POA: Insufficient documentation

## 2017-10-13 DIAGNOSIS — E46 Unspecified protein-calorie malnutrition: Secondary | ICD-10-CM | POA: Insufficient documentation

## 2017-10-13 DIAGNOSIS — K219 Gastro-esophageal reflux disease without esophagitis: Secondary | ICD-10-CM | POA: Insufficient documentation

## 2017-10-13 LAB — CBC WITH DIFFERENTIAL/PLATELET
BASOS PCT: 1 %
Basophils Absolute: 0.1 10*3/uL (ref 0–0.1)
EOS PCT: 1 %
Eosinophils Absolute: 0 10*3/uL (ref 0–0.7)
HEMATOCRIT: 27.6 % — AB (ref 40.0–52.0)
Hemoglobin: 9.2 g/dL — ABNORMAL LOW (ref 13.0–18.0)
Lymphocytes Relative: 14 %
Lymphs Abs: 1.3 10*3/uL (ref 1.0–3.6)
MCH: 33.6 pg (ref 26.0–34.0)
MCHC: 33.2 g/dL (ref 32.0–36.0)
MCV: 101.1 fL — ABNORMAL HIGH (ref 80.0–100.0)
MONO ABS: 1.3 10*3/uL — AB (ref 0.2–1.0)
MONOS PCT: 14 %
Neutro Abs: 6.6 10*3/uL — ABNORMAL HIGH (ref 1.4–6.5)
Neutrophils Relative %: 70 %
Platelets: 221 10*3/uL (ref 150–440)
RBC: 2.74 MIL/uL — ABNORMAL LOW (ref 4.40–5.90)
RDW: 18.2 % — AB (ref 11.5–14.5)
WBC: 9.4 10*3/uL (ref 3.8–10.6)

## 2017-10-13 LAB — COMPREHENSIVE METABOLIC PANEL
ALK PHOS: 277 U/L — AB (ref 38–126)
ALT: 19 U/L (ref 0–44)
AST: 29 U/L (ref 15–41)
Albumin: 3.3 g/dL — ABNORMAL LOW (ref 3.5–5.0)
Anion gap: 8 (ref 5–15)
BUN: 41 mg/dL — AB (ref 8–23)
CALCIUM: 9.1 mg/dL (ref 8.9–10.3)
CO2: 20 mmol/L — AB (ref 22–32)
Chloride: 110 mmol/L (ref 98–111)
Creatinine, Ser: 1.25 mg/dL — ABNORMAL HIGH (ref 0.61–1.24)
GFR calc Af Amer: >60 mL/min (ref >60)
GFR calc non Af Amer: 53 mL/min — ABNORMAL LOW (ref >60)
Glucose, Bld: 130 mg/dL — ABNORMAL HIGH (ref 70–99)
Potassium: 5.4 mmol/L — ABNORMAL HIGH (ref 3.5–5.1)
SODIUM: 138 mmol/L (ref 135–145)
Total Bilirubin: 0.6 mg/dL (ref 0.3–1.2)
Total Protein: 7.2 g/dL (ref 6.5–8.1)

## 2017-10-13 LAB — SAMPLE TO BLOOD BANK

## 2017-10-13 NOTE — Progress Notes (Signed)
Pt in for follow up, reports "felt the best I have in a long time over the last 2 weeks".

## 2017-10-15 ENCOUNTER — Inpatient Hospital Stay: Payer: Medicare Other

## 2017-10-15 ENCOUNTER — Other Ambulatory Visit (HOSPITAL_COMMUNITY)
Admission: RE | Admit: 2017-10-15 | Discharge: 2017-10-15 | Disposition: A | Discharge status: Home / Self Care | Payer: Medicare Other | Source: Ambulatory Visit | Attending: Oncology | Admitting: Oncology

## 2017-10-15 DIAGNOSIS — I2510 Atherosclerotic heart disease of native coronary artery without angina pectoris: Secondary | ICD-10-CM | POA: Diagnosis not present

## 2017-10-15 DIAGNOSIS — Z9049 Acquired absence of other specified parts of digestive tract: Secondary | ICD-10-CM | POA: Diagnosis not present

## 2017-10-15 DIAGNOSIS — Z79899 Other long term (current) drug therapy: Secondary | ICD-10-CM | POA: Diagnosis not present

## 2017-10-15 DIAGNOSIS — E1165 Type 2 diabetes mellitus with hyperglycemia: Secondary | ICD-10-CM | POA: Diagnosis not present

## 2017-10-15 DIAGNOSIS — C23 Malignant neoplasm of gallbladder: Secondary | ICD-10-CM

## 2017-10-15 DIAGNOSIS — E7800 Pure hypercholesterolemia, unspecified: Secondary | ICD-10-CM | POA: Diagnosis not present

## 2017-10-15 DIAGNOSIS — M545 Low back pain: Secondary | ICD-10-CM | POA: Diagnosis not present

## 2017-10-15 DIAGNOSIS — Z5111 Encounter for antineoplastic chemotherapy: Secondary | ICD-10-CM | POA: Diagnosis not present

## 2017-10-15 DIAGNOSIS — C7971 Secondary malignant neoplasm of right adrenal gland: Secondary | ICD-10-CM | POA: Diagnosis not present

## 2017-10-15 DIAGNOSIS — R5383 Other fatigue: Secondary | ICD-10-CM | POA: Diagnosis not present

## 2017-10-15 DIAGNOSIS — D649 Anemia, unspecified: Secondary | ICD-10-CM | POA: Insufficient documentation

## 2017-10-15 DIAGNOSIS — R188 Other ascites: Secondary | ICD-10-CM | POA: Diagnosis not present

## 2017-10-15 DIAGNOSIS — N281 Cyst of kidney, acquired: Secondary | ICD-10-CM | POA: Diagnosis not present

## 2017-10-15 DIAGNOSIS — Z7982 Long term (current) use of aspirin: Secondary | ICD-10-CM | POA: Diagnosis not present

## 2017-10-15 DIAGNOSIS — R531 Weakness: Secondary | ICD-10-CM | POA: Diagnosis not present

## 2017-10-15 DIAGNOSIS — R918 Other nonspecific abnormal finding of lung field: Secondary | ICD-10-CM | POA: Diagnosis not present

## 2017-10-15 DIAGNOSIS — K5730 Diverticulosis of large intestine without perforation or abscess without bleeding: Secondary | ICD-10-CM | POA: Diagnosis not present

## 2017-10-15 DIAGNOSIS — I10 Essential (primary) hypertension: Secondary | ICD-10-CM | POA: Diagnosis not present

## 2017-10-15 DIAGNOSIS — G4730 Sleep apnea, unspecified: Secondary | ICD-10-CM | POA: Diagnosis not present

## 2017-10-15 DIAGNOSIS — E875 Hyperkalemia: Secondary | ICD-10-CM | POA: Diagnosis not present

## 2017-10-15 DIAGNOSIS — M109 Gout, unspecified: Secondary | ICD-10-CM | POA: Diagnosis not present

## 2017-10-15 DIAGNOSIS — K5090 Crohn's disease, unspecified, without complications: Secondary | ICD-10-CM | POA: Diagnosis not present

## 2017-10-15 DIAGNOSIS — K219 Gastro-esophageal reflux disease without esophagitis: Secondary | ICD-10-CM | POA: Diagnosis not present

## 2017-10-15 LAB — COMPREHENSIVE METABOLIC PANEL
ALT: 20 U/L (ref 0–44)
ANION GAP: 8 (ref 5–15)
AST: 28 U/L (ref 15–41)
Albumin: 3.3 g/dL — ABNORMAL LOW (ref 3.5–5.0)
Alkaline Phosphatase: 284 U/L — ABNORMAL HIGH (ref 38–126)
BILIRUBIN TOTAL: 0.7 mg/dL (ref 0.3–1.2)
BUN: 33 mg/dL — ABNORMAL HIGH (ref 8–23)
CHLORIDE: 107 mmol/L (ref 98–111)
CO2: 20 mmol/L — ABNORMAL LOW (ref 22–32)
Calcium: 9 mg/dL (ref 8.9–10.3)
Creatinine, Ser: 1.31 mg/dL — ABNORMAL HIGH (ref 0.61–1.24)
GFR, EST AFRICAN AMERICAN: 58 mL/min — AB (ref >60)
GFR, EST NON AFRICAN AMERICAN: 50 mL/min — AB (ref >60)
Glucose, Bld: 144 mg/dL — ABNORMAL HIGH (ref 70–99)
POTASSIUM: 4.4 mmol/L (ref 3.5–5.1)
Sodium: 135 mmol/L (ref 135–145)
TOTAL PROTEIN: 7.2 g/dL (ref 6.5–8.1)

## 2017-10-15 LAB — CBC WITH DIFFERENTIAL/PLATELET
Basophils Absolute: 0.1 10*3/uL (ref 0–0.1)
Basophils Relative: 1 %
EOS PCT: 0 %
Eosinophils Absolute: 0 10*3/uL (ref 0–0.7)
HCT: 28 % — ABNORMAL LOW (ref 40.0–52.0)
HEMOGLOBIN: 9.4 g/dL — AB (ref 13.0–18.0)
LYMPHS ABS: 1.2 10*3/uL (ref 1.0–3.6)
LYMPHS PCT: 13 %
MCH: 33.6 pg (ref 26.0–34.0)
MCHC: 33.5 g/dL (ref 32.0–36.0)
MCV: 100 fL (ref 80.0–100.0)
Monocytes Absolute: 1.1 10*3/uL — ABNORMAL HIGH (ref 0.2–1.0)
Monocytes Relative: 13 %
NEUTROS PCT: 73 %
Neutro Abs: 6.6 10*3/uL — ABNORMAL HIGH (ref 1.4–6.5)
PLATELETS: 237 10*3/uL (ref 150–440)
RBC: 2.8 MIL/uL — AB (ref 4.40–5.90)
RDW: 18.3 % — ABNORMAL HIGH (ref 11.5–14.5)
WBC: 9 10*3/uL (ref 3.8–10.6)

## 2017-10-15 LAB — SAMPLE TO BLOOD BANK

## 2017-10-20 ENCOUNTER — Telehealth: Payer: Self-pay

## 2017-10-20 ENCOUNTER — Other Ambulatory Visit: Payer: Self-pay | Admitting: Oncology

## 2017-10-20 ENCOUNTER — Telehealth: Payer: Self-pay | Admitting: Pharmacy Technician

## 2017-10-20 DIAGNOSIS — I420 Dilated cardiomyopathy: Secondary | ICD-10-CM | POA: Diagnosis not present

## 2017-10-20 DIAGNOSIS — C23 Malignant neoplasm of gallbladder: Secondary | ICD-10-CM

## 2017-10-20 DIAGNOSIS — C801 Malignant (primary) neoplasm, unspecified: Secondary | ICD-10-CM | POA: Diagnosis not present

## 2017-10-20 MED ORDER — CAPECITABINE 500 MG PO TABS: 1000.0000 mg/m2 | ORAL_TABLET | Freq: Two times a day (BID) | ORAL | 2 refills | Status: DC

## 2017-10-20 NOTE — Telephone Encounter (Signed)
Oral Oncology Patient Advocate Encounter   Completed benefits investigation for Xeloda (capecitabine)  #112 tablets for 21 day course.  Test claim revealed a co-pay of $63.66.  I did not see any grant funding available for his disease status at this time.    [place] Patient [place] Phone [phone] Fax [phone] 10/20/2017 3:42 PM

## 2017-10-20 NOTE — Progress Notes (Signed)
DISCONTINUE OFF PATHWAY REGIMEN - [Other Dx]   [id number]:Gemcitabine + Cisplatin every 28 days:   A cycle is every 28 days:     Gemcitabine      Cisplatin   **Always confirm dose/schedule in your pharmacy ordering system**  REASON: Disease Progression PRIOR TREATMENT: Gemcitabine + Cisplatin every 28 days TREATMENT RESPONSE: Progressive Disease (PD)  START OFF PATHWAY REGIMEN - [Other Dx]   [id number]:Capecitabine + Oxaliplatin (850/130):   A cycle is every 21 days:     Capecitabine      Oxaliplatin   **Always confirm dose/schedule in your pharmacy ordering system**  Patient Characteristics: Intent of Therapy: Non-Curative / Palliative Intent, Discussed with Patient

## 2017-10-20 NOTE — Telephone Encounter (Signed)
Called pt and left a detailed VM that they are more than welcome to come by and pick up a specimen container. Advised them that our office does close at 5 PM today if they have time to come and pick it up.

## 2017-10-20 NOTE — Telephone Encounter (Signed)
Copied from [place] [id number]. Topic: Inquiry >> Oct 20, 2017 10:02 AM Rutherford Nail, NT wrote: Reason for CRM: Patient calling and wants to know if he can come by today and pick up a urine specimen container? States that he has an appointment to drop off a urine sample at 10am on 10/21/17, but states that he does not urinate well and would like to know if it is possible to pick the cup up today to put in a sample in the morning before getting to the office and then bring it by at the 10am appt?

## 2017-10-21 ENCOUNTER — Other Ambulatory Visit (INDEPENDENT_AMBULATORY_CARE_PROVIDER_SITE_OTHER): Payer: Medicare Other

## 2017-10-21 ENCOUNTER — Other Ambulatory Visit: Payer: Self-pay | Admitting: Family Medicine

## 2017-10-21 DIAGNOSIS — R809 Proteinuria, unspecified: Secondary | ICD-10-CM

## 2017-10-21 DIAGNOSIS — R801 Persistent proteinuria, unspecified: Secondary | ICD-10-CM

## 2017-10-21 LAB — URINALYSIS, MICROSCOPIC ONLY

## 2017-10-21 LAB — POCT URINALYSIS DIPSTICK
BILIRUBIN UA: NEGATIVE
GLUCOSE UA: NEGATIVE
KETONES UA: NEGATIVE
LEUKOCYTES UA: NEGATIVE
Nitrite, UA: NEGATIVE
Protein, UA: POSITIVE — AB
SPEC GRAV UA: 1.02 (ref 1.010–1.025)
Urobilinogen, UA: 0.2 E.U./dL
pH, UA: 5 (ref 5.0–8.0)

## 2017-10-21 LAB — MICROALBUMIN / CREATININE URINE RATIO
Creatinine,U: 118.4 mg/dL
MICROALB UR: 5.5 mg/dL — AB (ref 0.0–1.9)
Microalb Creat Ratio: 4.7 mg/g (ref 0.0–30.0)

## 2017-10-21 NOTE — Addendum Note (Signed)
Addended by: Leeanne Rio on: 10/21/2017 12:38 PM   Modules accepted: Orders

## 2017-10-21 NOTE — Telephone Encounter (Signed)
Thank you. I spoke with patient and he states the co-pay is reasonable.

## 2017-10-22 ENCOUNTER — Other Ambulatory Visit: Payer: Self-pay | Admitting: Oncology

## 2017-10-24 NOTE — Progress Notes (Signed)
[place]  Telephone:([phone]) [phone] [phone]  ID: Thomas David OB: 19-Apr-1935  MR#: [id number]  VOZ#:[id number]  Patient Care Team: Leone Haven, MD as PCP - General (Family Medicine) Kathyrn Drown, MD (Family Medicine) Clent Jacks, RN as Registered Nurse  CHIEF COMPLAINT: Adenocarcinoma of gallbladder.  INTERVAL HISTORY: Patient returns to clinic today for further evaluation and initiation of cycle 1 of FOLFOX for his progressive adenocarcinoma of the gallbladder.  He currently feels well and is asymptomatic.  He does not complain of weakness or fatigue today.  He does not complain of dyspnea on exertion today.  He has a fair appetite. He does not complain of constipation or diarrhea today.  He does not complain of pain today.  He has no neurologic complaints.  He denies any recent fevers or illnesses. He denies any chest pain.  He has no urinary complaints.  Patient offers no specific complaints today.  REVIEW OF SYSTEMS:   Review of Systems  Constitutional: Negative.  Negative for fever, malaise/fatigue and weight loss.  Eyes: Negative for blurred vision.  Respiratory: Negative.  Negative for cough and shortness of breath.   Cardiovascular: Negative.  Negative for chest pain and leg swelling.  Gastrointestinal: Negative.  Negative for abdominal pain, blood in stool, constipation, heartburn, melena and nausea.  Genitourinary: Negative.  Negative for dysuria and frequency.  Musculoskeletal: Negative.  Negative for falls.  Skin: Negative.  Negative for rash.  Neurological: Negative.  Negative for sensory change, focal weakness and weakness.  Psychiatric/Behavioral: Negative.  The patient is not nervous/anxious.     As per HPI. Otherwise, a complete review of systems is negative.  PAST MEDICAL HISTORY: Past Medical History:  Diagnosis Date  . Anxiety   . Arthritis   . Cancer ([place])    gallbladder CA  . Chronic lower back pain   .  Crohn's disease ([place])   . Depression   . Diabetes mellitus without complication ([place])   . Difficult intubation    patient denies on 02/12/17  . GERD (gastroesophageal reflux disease)   . Gout   . History of kidney stones 40 years ago  . Hypercholesteremia   . Hypertension   . Prediabetes   . Sleep apnea    cpap     PAST SURGICAL HISTORY: Past Surgical History:  Procedure Laterality Date  . CHOLECYSTECTOMY N/A 11/03/2016   Procedure: LAPAROSCOPIC CHOLECYSTostomy with tube placement ;  Surgeon: Johnathan Hausen, MD;  Location: [facility];  Service: General;  Laterality: N/A;  . CHOLECYSTECTOMY N/A 02/15/2017   Procedure: LAPAROSCOPIC SUBTOTAL CHOLECYSTECTOMY, BIOPSY OF GALLBLADDER;  Surgeon: Johnathan Hausen, MD;  Location: [facility];  Service: General;  Laterality: N/A;  . COLECTOMY  1973   "part of large intestines" (05/04/2012)  . COLON RESECTION    . COLONOSCOPY    . COLONOSCOPY N/A 05/23/2014   Procedure: COLONOSCOPY;  Surgeon: Rogene Houston, MD;  [facility];  Service: Endoscopy;  Laterality: N/A;  1200  . ESOPHAGEAL DILATION N/A 05/23/2014   Procedure: ESOPHAGEAL DILATION;  Surgeon: Rogene Houston, MD;  [facility];  Service: Endoscopy;  Laterality: N/A;  . ESOPHAGOGASTRODUODENOSCOPY N/A 03/16/2014   Procedure: esophageal dilation ;  Surgeon: Rogene Houston, MD;  [facility];  Service: Endoscopy;  Laterality: N/A;  . ESOPHAGOGASTRODUODENOSCOPY N/A 05/23/2014   Procedure: ESOPHAGOGASTRODUODENOSCOPY (EGD);  Surgeon: Rogene Houston, MD;  [facility];  Service: Endoscopy;  Laterality: N/A;  . ESOPHAGOGASTRODUODENOSCOPY (EGD)  WITH PROPOFOL N/A 02/18/2017   Procedure: ESOPHAGOGASTRODUODENOSCOPY (EGD) WITH PROPOFOL;  Surgeon: Wonda Horner, MD;  [facility];  Service: Endoscopy;  Laterality: N/A;  . [place]  . IR CHOLANGIOGRAM EXISTING TUBE  11/26/2016  . KIDNEY STONE SURGERY  1970's  . PARTIAL COLECTOMY  ~ 40 years  ago   some of large and small  . PORTA CATH INSERTION N/A 03/17/2017   Procedure: PORTA CATH INSERTION;  Surgeon: Katha Cabal, MD;  Location: [facility];  Service: Cardiovascular;  Laterality: N/A;  . PROSTATE SURGERY    . THYROIDECTOMY Right 05/04/2012   Procedure: RIGHT HEMI THYROIDECTOMY;  Surgeon: Ascencion Dike, MD;  Location: [facility];  Service: ENT;  Laterality: Right;  . THYROIDECTOMY    . TONSILLECTOMY AND ADENOIDECTOMY     as a child  . TRANSURETHRAL PROSTATECTOMY WITH GYRUS INSTRUMENTS N/A 07/11/2012   Procedure: TRANSURETHRAL PROSTATECTOMY WITH GYRUS INSTRUMENTS;  Surgeon: Claybon Jabs, MD;  Location[facility];  Service: Urology;  Laterality: N/A;  . UPPER GASTROINTESTINAL ENDOSCOPY      FAMILY HISTORY: Family History  Problem Relation Age of Onset  . Diabetes Mother   . Stroke Father   . Healthy Daughter   . Healthy Son   . Heart disease Sister     ADVANCED DIRECTIVES (Y/N):  N  HEALTH MAINTENANCE: Social History   Tobacco Use  . Smoking status: Never Smoker  . Smokeless tobacco: Never Used  Substance Use Topics  . Alcohol use: No    Alcohol/week: 0.0 standard drinks  . Drug use: No     Colonoscopy:  PAP:  Bone density:  Lipid panel:  No Known Allergies  Current Outpatient Medications  Medication Sig Dispense Refill  . allopurinol (ZYLOPRIM) 300 MG tablet Take 1 tablet (300 mg total) by mouth every morning. 90 tablet 3  . amLODipine (NORVASC) 5 MG tablet TAKE 1 TABLET BY MOUTH  DAILY 90 tablet 3  . aspirin EC 81 MG tablet Take 81 mg by mouth at bedtime.    . fentaNYL (DURAGESIC - DOSED MCG/HR) 25 MCG/HR patch Place 1 patch (25 mcg total) onto the skin every 3 (three) days. 10 patch 0  . fluticasone (FLONASE) 50 MCG/ACT nasal spray USE 1 SPRAY INTO BOTH  NOSTRILS DAILY 32 g 0  . Histamine Dihydrochloride (AUSTRALIAN DREAM ARTHRITIS EX) Apply 1 application topically 2 (two) times daily as needed (back pain).     Marland Kitchen lactulose (CHRONULAC) 10 GM/15ML  solution Take 15 mLs (10 g total) by mouth daily. 240 mL 1  . losartan (COZAAR) 100 MG tablet Take 1 tablet (100 mg total) by mouth daily. 90 tablet 3  . megestrol (MEGACE) 40 MG tablet Take 1 tablet (40 mg total) by mouth daily. 30 tablet 2  . mesalamine (PENTASA) 500 MG CR capsule Take 2 capsules (1,000 mg total) by mouth 2 (two) times daily. 360 capsule 3  . metoprolol tartrate (LOPRESSOR) 50 MG tablet Take 1 tablet (50 mg total) by mouth 2 (two) times daily. 180 tablet 3  . Multiple Vitamin (MULTIVITAMIN WITH MINERALS) TABS tablet Take 2 tablets by mouth daily.    Marland Kitchen oxybutynin (DITROPAN-XL) 5 MG 24 hr tablet TAKE 1 TABLET BY MOUTH AT  BEDTIME 90 tablet 1  . pantoprazole (PROTONIX) 40 MG tablet Take 1 tablet (40 mg total) by mouth daily. 90 tablet 1  . Simethicone 180 MG CAPS Take 1 capsule (180 mg total) by mouth 3 (three) times daily  as needed.  0  . simvastatin (ZOCOR) 20 MG tablet Take 1 tablet (20 mg total) by mouth daily. 90 tablet 3  . vitamin B-12 (CYANOCOBALAMIN) 1000 MCG tablet Take 1,000 mcg by mouth 2 (two) times daily.    Marland Kitchen acetaminophen (TYLENOL) 500 MG tablet Take 1,000 mg by mouth every 4 (four) hours as needed for moderate pain or headache.     . bisacodyl (DULCOLAX) 10 MG suppository Place 1 suppository (10 mg total) rectally daily as needed for moderate constipation. (Patient not taking: Reported on 10/27/2017) 12 suppository 0  . HYDROcodone-acetaminophen (NORCO/VICODIN) 5-325 MG tablet Take 1-2 tablets by mouth every 4 (four) hours as needed for moderate pain. (Patient not taking: Reported on 10/27/2017) 30 tablet 0  . ondansetron (ZOFRAN ODT) 8 MG disintegrating tablet Take 1 tablet (8 mg total) by mouth every 8 (eight) hours as needed for nausea or vomiting. 20 tablet 0  . ondansetron (ZOFRAN) 8 MG tablet Take 1 tablet (8 mg total) by mouth 2 (two) times daily. (Patient not taking: Reported on 10/27/2017) 30 tablet 3  . promethazine (PHENERGAN) 25 MG suppository Place 1  suppository (25 mg total) rectally every 6 (six) hours as needed for nausea or vomiting. 12 each 0   No current facility-administered medications for this visit.    Facility-Administered Medications Ordered in Other Visits  Medication Dose Route Frequency Provider Last Rate Last Dose  . heparin lock flush 100 unit/mL  500 Units Intravenous Once Faythe Casa E, NP      . sodium chloride flush (NS) 0.9 % injection 10 mL  10 mL Intravenous PRN Jacquelin Hawking, NP   10 mL at 07/12/17 1445    OBJECTIVE: Vitals:   10/27/17 0832  BP: 108/67  Pulse: 83  Resp: 18  Temp: (!) 94.2 F (34.6 C)     Body mass index is 26.33 kg/m.    ECOG FS:1 - Symptomatic but completely ambulatory  General: Well-developed, well-nourished, no acute distress. Eyes: Pink conjunctiva, anicteric sclera. HEENT: Normocephalic, moist mucous membranes. Lungs: Clear to auscultation bilaterally. Heart: Regular rate and rhythm. No rubs, murmurs, or gallops. Abdomen: Soft, nontender, nondistended. No organomegaly noted, normoactive bowel sounds. Musculoskeletal: No edema, cyanosis, or clubbing. Neuro: Alert, answering all questions appropriately. Cranial nerves grossly intact. Skin: No rashes or petechiae noted. Psych: Normal affect.  LAB RESULTS:  Lab Results  Component Value Date   NA 138 10/29/2017   K 3.6 10/29/2017   CL 111 10/29/2017   CO2 18 (L) 10/29/2017   GLUCOSE 150 (H) 10/29/2017   BUN 44 (H) 10/29/2017   CREATININE 1.24 10/29/2017   CALCIUM 8.5 (L) 10/29/2017   PROT 6.5 10/29/2017   ALBUMIN 2.8 (L) 10/29/2017   AST 23 10/29/2017   ALT 21 10/29/2017   ALKPHOS 213 (H) 10/29/2017   BILITOT 0.7 10/29/2017   GFRNONAA 53 (L) 10/29/2017   GFRAA >60 10/29/2017    Lab Results  Component Value Date   WBC 5.4 10/29/2017   NEUTROABS 4.2 10/29/2017   HGB 9.7 (L) 10/29/2017   HCT 28.7 (L) 10/29/2017   MCV 96.5 10/29/2017   PLT 191 10/29/2017     STUDIES: Ct Abdomen Pelvis W  Contrast  Result Date: 10/28/2017 CLINICAL DATA:  82 year old male with history of gallbladder carcinoma. Patient presenting with vomiting. EXAM: CT ABDOMEN AND PELVIS WITH CONTRAST TECHNIQUE: Multidetector CT imaging of the abdomen and pelvis was performed using the standard protocol following bolus administration of intravenous contrast. CONTRAST:  69m OMNIPAQUE IOHEXOL  300 MG/ML  SOLN COMPARISON:  Abdominal CT dated 03/05/2017 and PET-CT dated 10/11/2017 FINDINGS: Lower chest: There are 2 nodules in the left lower lobe measuring up to 7 mm, 1 new, and the other increased in size since the prior CT concerning for metastatic disease. There is coronary vascular calcification. No intra-abdominal free air. Small ascites increased since the prior PET. Hepatobiliary: A 9 mm hypodense focus in the anterior liver (series 2, image 16) is not well characterized. Additional subcentimeter hypodense foci noted in the liver. There is periportal edema or mild biliary ductal dilatation. The gallbladder is distended. There is a history of subtotal cholecystectomy. There is a focal area of thickening of the gallbladder wall adjacent to the second portion of the duodenum (series 5, image 33). Pancreas: The body and tail of the pancreas are atrophic with interval progression of atrophy since CT of 03/05/2017. Spleen: Normal in size without focal abnormality. Adrenals/Urinary Tract: A 4.7 x 5.3 cm heterogeneously enhancing right adrenal mass appears larger since the prior PET when it measured 4.6 x 4.9 cm. Left adrenal thickening. Bilateral renal cysts measure up to 9.4 cm in the upper pole of the right kidney. A 2 cm exophytic lesion from the posterior cortex of the right kidney demonstrates high attenuation, likely a complex or hemorrhagic cyst. There is no hydronephrosis on either side. There is symmetric enhancement of the kidneys. The urinary bladder is partially distended and grossly unremarkable. Stomach/Bowel: There is  sigmoid diverticulosis without active inflammatory changes. Mildly thickened stomach and top-normal fluid-filled loops of small bowel may be related to ascites although gastroenteritis is not excluded. Clinical correlation is recommended. No evidence of bowel obstruction. Normal appendix. There is abutment of the Patty flexure of the colon to the gallbladder. Direct invasion of the colon by the gallbladder tumor was reported on the prior CT. Vascular/Lymphatic: Moderate aortoiliac atherosclerotic disease. No portal venous gas. There is no retroperitoneal adenopathy. There is diffuse omental nodularity and caking. Diffuse mesenteric edema. Reproductive: Enlarged prostate gland with probable postprocedural changes of TURP. The prostate measures 5.2 cm in transverse diameter. Other: Bilateral fat containing inguinal hernia, left greater right. Musculoskeletal: Degenerative changes of the spine. IMPRESSION: 1. Reactive thickening of the stomach versus gastroenteritis. Clinical correlation is recommended. No bowel obstruction. Normal appendix. 2. Small ascites diffuse mesenteric edema, and omental nodularity and caking consistent with metastasis. 3. Abutment of the gallbladder to the hepatic flexure of the colon as well as second portion of the duodenum. Direct invasion of the colon was reported on the prior CT. 4. Atrophy of the body and tail of the pancreas, progressed since the prior CT. 5. Increased size of the right adrenal metastatic disease. 6. Increase in the size of the left lower lobe pulmonary nodules concerning for metastatic disease. Electronically Signed   By: Anner Crete M.D.   On: 10/28/2017 23:23   Nm Pet Image Restag (ps) Skull Base To Thigh  Result Date: 10/11/2017 CLINICAL DATA:  Subsequent treatment strategy for gallbladder adenocarcinoma. EXAM: NUCLEAR MEDICINE PET SKULL BASE TO THIGH TECHNIQUE: 10.0 mCi F-18 FDG was injected intravenously. Full-ring PET imaging was performed from the skull  base to thigh after the radiotracer. CT data was obtained and used for attenuation correction and anatomic localization. Fasting blood glucose: 91 mg/dl COMPARISON:  Multiple exams, including 07/06/2017 FINDINGS: Mediastinal blood pool activity: SUV max 1.8 NECK: No significant abnormal hypermetabolic activity in this region. Incidental CT findings: Atherosclerotic calcification of the common carotid arteries. CHEST: A left supraclavicular node  measuring 0.7 cm in short axis on image 66/3 has a maximum SUV 2.6. Right lower paratracheal node measuring 1.0 cm in short axis on image 97/3 has a maximum SUV of 3.7, formerly 2.3. Indistinct activity at the right hilum, maximum SUV 2.8. Diffuse esophageal activity noted, activity in the mid esophagus with maximum SUV of 6.3. This appearance could be from esophagitis or physiologic. Given the long segment of involvement, esophageal cancer is unlikely. Chronic volume loss in the right upper lobe adjacent to the minor fissure without significant accentuated metabolic activity, unchanged from recent chest CT. 6 by 4 mm right upper lobe pulmonary nodule on image 82/3, no appreciable accentuated metabolic activity but below sensitive PET-CT size thresholds. 6 by 7 mm left lower lobe nodule on image 131/3, previously 4 by 5 mm on 07/12/2017, maximum SUV 1.4. Incidental CT findings: Coronary, aortic arch, and branch vessel atherosclerotic vascular disease. Mild cardiomegaly. Right Port-A-Cath tip: Cavoatrial junction. ABDOMEN/PELVIS: Worsening omental caking of tumor with worsening tumor nodularity in the left upper quadrant omentum as well, and worsening right adrenal metastatic lesion. The right upper quadrant omental caking has a maximum SUV of 6.2 (formerly about 4.3) and has a more solid appearance, measuring about 1.9 cm in width on image 180/3, previously 1.4 cm. The overall right adrenal mass measures 4.9 by 4.6 cm on image 149/3 (formerly 4.5 by 4.2 cm) with maximum SUV  10.6 (formerly 5.8). New hypermetabolic nodularity in the left upper quadrant has a maximum SUV of 4.0. There is a new tumor nodule in the left inguinal hernia with maximum SUV 4.3. New perisplenic and mild perihepatic ascites along with increased ascites in the paracolic gutters, probably malignant. High anorectal activity is most likely physiologic, strictly speaking anal malignancy cannot be excluded. Incidental CT findings: Bilateral renal cysts of varying complexity but are not hypermetabolic and accordingly favor benign cysts. Diffuse mesenteric edema, increased. Aortoiliac atherosclerotic vascular disease. Considerable prostatomegaly. Bilateral inguinal hernias contain adipose tissue, larger on the left the right, and the left likewise contains a small amount of tumor as detailed above. SKELETON: 7 mm small focus of sclerosis posteriorly in the T10 vertebral body with maximum SUV 3.9. Incidental CT findings: Bridging spurring of both sacroiliac joints. Degenerative arthropathy of both hips. IMPRESSION: 1. Significant worsening of omental caking of tumor with increase in solidity and activity in the dominant right upper quadrant omental caking; increased nodularity in the left upper quadrant omentum; and increase in ascites and mesenteric edema. 2. Significant increase in size and metabolic activity of the right adrenal metastatic lesion. 3. Mild left supraclavicular, and right paratracheal adenopathy in the chest could reflect early metastatic spread. Small pulmonary nodules are not appreciably hypermetabolic but are below sensitive PET-CT size thresholds; a left lower lobe nodule in particular on image 131/3 appears to have mildly enlarged compared to the prior exam, concerning for early metastatic disease and meriting surveillance. 4. There bilateral inguinal hernias; the left inguinal hernia contains a small hypermetabolic tumor nodule. 5. 7 mm small focus of sclerosis posteriorly in the T10 vertebral body  is mildly hypermetabolic and likewise could represent a metastatic lesion. 6. Other imaging findings of potential clinical significance: Aortic Atherosclerosis (ICD10-I70.0). Coronary atherosclerosis with mild cardiomegaly. Bilateral renal cysts of varying complexity appear benign. Prostatomegaly. Electronically Signed   By: Van Clines M.D.   On: 10/11/2017 12:08    ASSESSMENT: Adenocarcinoma of gallbladder, MSI-stable.  PLAN:    1. Adenocarcinoma of gallbladder: PET scan results from October 11, 2017 reviewed  independently and reported as above with significant progression of disease.  Previously, hospice and end-of-life care were discussed but patient and family did not wish to pursue this at this time.  He declined treatment with CAPEOX secondary to difficulty taking increased pills required with oral chemotherapy.  He has agreed to FOLFOX.  Proceed with cycle 1 today.  Return to clinic in 2 days for pump removal and IV fluids.  Patient will then return to clinic in 2 weeks for further evaluation and consideration of cycle 2.   2.  Disposition: Hospice and end-of-life were discussed, but patient and family do not wish to pursue this at this time. 3.  Poor appetite: Improved.  Appreciate dietary input.  Continue Megace as prescribed.   3.  Hyperglycemia: Patient continues to have improved control.  Monitor.   4.  Pain: Patient does not complain of this today.  Well controlled on his current narcotic regimen.  He was given a refill of his fentanyl and hydrocodone today.   5.  Anemia: Hemoglobin decreased, but unchanged at 9.4.  He does not require further transfusion today.   6.  Thrombocytopenia: Resolved. 7.  Neutropenia: Resolved. 8.  Weakness and fatigue: Continue CARE program as ordered. 9.  Hyperkalemia: Resolved.  Patient expressed understanding and was in agreement with this plan. He also understands that He can call clinic at any time with any questions, concerns, or complaints.    Cancer Staging Adenocarcinoma of gallbladder [facility]) Staging form: Gallbladder, AJCC 8th Edition - Clinical stage from 03/03/2017: Stage IVB (cT3, cN0, cM1) - Signed by Lloyd Huger, MD on 03/23/2017   Lloyd Huger, MD   10/30/2017 7:49 AM

## 2017-10-25 ENCOUNTER — Telehealth: Payer: Self-pay | Admitting: Pharmacy Technician

## 2017-10-25 ENCOUNTER — Telehealth: Payer: Self-pay | Admitting: Pharmacist

## 2017-10-25 DIAGNOSIS — C23 Malignant neoplasm of gallbladder: Secondary | ICD-10-CM | POA: Diagnosis not present

## 2017-10-25 MED ORDER — CAPECITABINE 500 MG PO TABS: 1000.0000 mg/m2 | ORAL_TABLET | Freq: Two times a day (BID) | ORAL | 2 refills | Status: DC

## 2017-10-25 NOTE — Telephone Encounter (Signed)
Oral Oncology Pharmacist Encounter  Received new prescription for Xeloda (capecitabine) for the treatment of adenocarcinoma of gallbladder in conjunction with oxaliplatin, planned duration until disease progression or unacceptable drug toxicity.  CMP from 10/15/17 assessed, SCr trending up. For renal impairment, CrCl 30-50 mL/min, the recommendation is to dose reduction the capecitabine starting dose by 75%. Monitor his CrCl and SCr for the need to dose adjust.   Prescription dose and frequency assessed.   Current medication list in Epic reviewed, a few DDIs with capecitabine identified: - Allopurinol: Category X interaction; Allopurinol may decrease serum concentrations of the active metabolites of capecitabine. The recommendation is to avoid allopurinol while on capecitabine. Alterative gout options to consider that would not have an interaction with capecitabine: colchicine or NSAIDs (naproxen) - Pantoprazole: Proton Pump Inhibitors may diminish the therapeutic effect of Capecitabine. Recommend evaluating the need for a proton pump inhibitor (PPI). If acid suppression is needed, recommend switching to a H2 antagonist (eg, ranitidine) to avoid this DDI.  - Mesalamine: mesalamine may enhance the myelosuppressive effect of capecitabine. Monitor CBC  Prescription has been e-scribed to the [facility] for benefits analysis and approval.  Oral Oncology Clinic will continue to follow for insurance authorization, copayment issues, initial counseling and start date.  Darl Pikes, PharmD, BCPS, [facility] Hematology/Oncology Clinical Pharmacist [facility] [phone]  10/25/2017 1:40 PM

## 2017-10-25 NOTE — Telephone Encounter (Deleted)
Oral Oncology Patient Advocate Encounter   Spoke with patients daughter to set up first fill of Xeloda.  Copayment discussed and patient is ok with the amount ($63.66).    [facility] will fill Xeloda 10/26/17 and Nuala Alpha, oral oncology pharmacist, will pick up medication and bring to patients appointment on 10/27/17.  Thomas David Patient [place] Phone [phone] Fax [phone] 10/25/2017 3:43 PM

## 2017-10-26 ENCOUNTER — Telehealth: Payer: Self-pay

## 2017-10-26 ENCOUNTER — Telehealth: Payer: Self-pay | Admitting: *Deleted

## 2017-10-26 ENCOUNTER — Other Ambulatory Visit: Payer: Self-pay | Admitting: Oncology

## 2017-10-26 DIAGNOSIS — C23 Malignant neoplasm of gallbladder: Secondary | ICD-10-CM

## 2017-10-26 NOTE — Telephone Encounter (Signed)
Oral Oncology Patient Advocate Encounter  Patients daughter called this morning and spoke with Tillie Rung to let her know that Mr Gell did not want to start Xeloda.  He will do IV treatment instead.    Rx that was being filled at [facility] has been reversed off insurance and returned to stock.  [place] Patient [place] Phone [phone] Fax [phone] 10/26/2017 2:46 PM

## 2017-10-26 NOTE — Telephone Encounter (Signed)
Oral Chemotherapy Pharmacist Encounter   Patient has decided to not proceed with capecitabine at this time. He and his family have elected to go with IV 5-FU instead. Pharmacy notified.    Darl Pikes, PharmD, BCPS, [facility] Hematology/Oncology Clinical Pharmacist [facility] [phone]  10/26/2017 2:43 PM

## 2017-10-26 NOTE — Progress Notes (Signed)
DISCONTINUE OFF PATHWAY REGIMEN - [Other Dx]   [id number]:Capecitabine + Oxaliplatin (850/130):   A cycle is every 21 days:     Capecitabine      Oxaliplatin   **Always confirm dose/schedule in your pharmacy ordering system**  REASON: Other Reason PRIOR TREATMENT: Capecitabine + Oxaliplatin (850/130)  START OFF PATHWAY REGIMEN - [Other Dx]   [id number] (q14d):   A cycle is every 14 days:     Oxaliplatin      Leucovorin      5-Fluorouracil      5-Fluorouracil   **Always confirm dose/schedule in your pharmacy ordering system**  Patient Characteristics: Intent of Therapy: Non-Curative / Palliative Intent, Discussed with Patient

## 2017-10-26 NOTE — Telephone Encounter (Signed)
Call received from patients daughter regarding treatment planning. Patient and family have decided to switch from CapOx planned for tomorrow to Folfox. I have spoken with [name] and Otho Perl, patient can proceed with treatment with Folfox tomorrow. I have also spoken with Alyson regarding stopping order of Capecitabine. Capecitabine will be cancelled and patient will be refunded. Patient notified and verbalized understanding.

## 2017-10-26 NOTE — Telephone Encounter (Signed)
Application for Disability parking [place] in [place] folder for signature.  Call pt once done can either mail or give to pt on his appt on 11/02/2017 for AWV

## 2017-10-27 ENCOUNTER — Inpatient Hospital Stay: Payer: Medicare Other

## 2017-10-27 ENCOUNTER — Inpatient Hospital Stay (HOSPITAL_BASED_OUTPATIENT_CLINIC_OR_DEPARTMENT_OTHER): Payer: Medicare Other | Admitting: Oncology

## 2017-10-27 ENCOUNTER — Other Ambulatory Visit: Payer: Self-pay

## 2017-10-27 VITALS — BP 108/67 | HR 83 | Temp 94.2°F | Resp 18 | Wt 178.3 lb

## 2017-10-27 DIAGNOSIS — K5730 Diverticulosis of large intestine without perforation or abscess without bleeding: Secondary | ICD-10-CM | POA: Diagnosis not present

## 2017-10-27 DIAGNOSIS — F419 Anxiety disorder, unspecified: Secondary | ICD-10-CM

## 2017-10-27 DIAGNOSIS — C23 Malignant neoplasm of gallbladder: Secondary | ICD-10-CM

## 2017-10-27 DIAGNOSIS — K219 Gastro-esophageal reflux disease without esophagitis: Secondary | ICD-10-CM

## 2017-10-27 DIAGNOSIS — E875 Hyperkalemia: Secondary | ICD-10-CM | POA: Diagnosis not present

## 2017-10-27 DIAGNOSIS — R5383 Other fatigue: Secondary | ICD-10-CM

## 2017-10-27 DIAGNOSIS — Z5111 Encounter for antineoplastic chemotherapy: Secondary | ICD-10-CM | POA: Diagnosis not present

## 2017-10-27 DIAGNOSIS — Z9049 Acquired absence of other specified parts of digestive tract: Secondary | ICD-10-CM | POA: Diagnosis not present

## 2017-10-27 DIAGNOSIS — M545 Low back pain: Secondary | ICD-10-CM

## 2017-10-27 DIAGNOSIS — I10 Essential (primary) hypertension: Secondary | ICD-10-CM

## 2017-10-27 DIAGNOSIS — M109 Gout, unspecified: Secondary | ICD-10-CM | POA: Diagnosis not present

## 2017-10-27 DIAGNOSIS — G4730 Sleep apnea, unspecified: Secondary | ICD-10-CM

## 2017-10-27 DIAGNOSIS — K5090 Crohn's disease, unspecified, without complications: Secondary | ICD-10-CM

## 2017-10-27 DIAGNOSIS — N281 Cyst of kidney, acquired: Secondary | ICD-10-CM | POA: Diagnosis not present

## 2017-10-27 DIAGNOSIS — Z7982 Long term (current) use of aspirin: Secondary | ICD-10-CM | POA: Diagnosis not present

## 2017-10-27 DIAGNOSIS — Z79899 Other long term (current) drug therapy: Secondary | ICD-10-CM | POA: Diagnosis not present

## 2017-10-27 DIAGNOSIS — R188 Other ascites: Secondary | ICD-10-CM

## 2017-10-27 DIAGNOSIS — D649 Anemia, unspecified: Secondary | ICD-10-CM

## 2017-10-27 DIAGNOSIS — R531 Weakness: Secondary | ICD-10-CM

## 2017-10-27 DIAGNOSIS — C7971 Secondary malignant neoplasm of right adrenal gland: Secondary | ICD-10-CM

## 2017-10-27 DIAGNOSIS — I2510 Atherosclerotic heart disease of native coronary artery without angina pectoris: Secondary | ICD-10-CM

## 2017-10-27 DIAGNOSIS — R918 Other nonspecific abnormal finding of lung field: Secondary | ICD-10-CM

## 2017-10-27 DIAGNOSIS — E1165 Type 2 diabetes mellitus with hyperglycemia: Secondary | ICD-10-CM | POA: Diagnosis not present

## 2017-10-27 DIAGNOSIS — E7800 Pure hypercholesterolemia, unspecified: Secondary | ICD-10-CM | POA: Diagnosis not present

## 2017-10-27 LAB — COMPREHENSIVE METABOLIC PANEL
ALBUMIN: 3.2 g/dL — AB (ref 3.5–5.0)
ALT: 20 U/L (ref 0–44)
ANION GAP: 10 (ref 5–15)
AST: 28 U/L (ref 15–41)
Alkaline Phosphatase: 288 U/L — ABNORMAL HIGH (ref 38–126)
BILIRUBIN TOTAL: 0.5 mg/dL (ref 0.3–1.2)
BUN: 51 mg/dL — AB (ref 8–23)
CHLORIDE: 109 mmol/L (ref 98–111)
CO2: 17 mmol/L — ABNORMAL LOW (ref 22–32)
Calcium: 8.9 mg/dL (ref 8.9–10.3)
Creatinine, Ser: 1.36 mg/dL — ABNORMAL HIGH (ref 0.61–1.24)
GFR calc Af Amer: 55 mL/min — ABNORMAL LOW (ref >60)
GFR, EST NON AFRICAN AMERICAN: 48 mL/min — AB (ref >60)
Glucose, Bld: 146 mg/dL — ABNORMAL HIGH (ref 70–99)
POTASSIUM: 3.7 mmol/L (ref 3.5–5.1)
Sodium: 136 mmol/L (ref 135–145)
TOTAL PROTEIN: 7.1 g/dL (ref 6.5–8.1)

## 2017-10-27 LAB — CBC WITH DIFFERENTIAL/PLATELET
Basophils Absolute: 0.1 10*3/uL (ref 0–0.1)
Basophils Relative: 1 %
EOS PCT: 2 %
Eosinophils Absolute: 0.2 10*3/uL (ref 0–0.7)
HEMATOCRIT: 27.6 % — AB (ref 40.0–52.0)
Hemoglobin: 9.4 g/dL — ABNORMAL LOW (ref 13.0–18.0)
LYMPHS ABS: 1.1 10*3/uL (ref 1.0–3.6)
Lymphocytes Relative: 9 %
MCH: 32.9 pg (ref 26.0–34.0)
MCHC: 34 g/dL (ref 32.0–36.0)
MCV: 96.8 fL (ref 80.0–100.0)
MONO ABS: 1.1 10*3/uL — AB (ref 0.2–1.0)
MONOS PCT: 9 %
NEUTROS ABS: 9.2 10*3/uL — AB (ref 1.4–6.5)
Neutrophils Relative %: 79 %
Platelets: 270 10*3/uL (ref 150–440)
RBC: 2.85 MIL/uL — ABNORMAL LOW (ref 4.40–5.90)
RDW: 17 % — AB (ref 11.5–14.5)
WBC: 11.6 10*3/uL — ABNORMAL HIGH (ref 3.8–10.6)

## 2017-10-27 MED ORDER — HEPARIN SOD (PORK) LOCK FLUSH 100 UNIT/ML IV SOLN
INTRAVENOUS | Status: AC
  Filled 2017-10-27: qty 5

## 2017-10-27 MED ORDER — SODIUM CHLORIDE 0.9 % IV SOLN: 10.0000 mg | Freq: Once | INTRAVENOUS | Status: DC

## 2017-10-27 MED ORDER — PALONOSETRON HCL INJECTION 0.25 MG/5ML
0.2500 mg | Freq: Once | INTRAVENOUS | Status: AC
  Administered 2017-10-27: 0.25 mg via INTRAVENOUS
  Filled 2017-10-27: qty 5

## 2017-10-27 MED ORDER — SODIUM CHLORIDE 0.9 % IV SOLN
2400.0000 mg/m2 | INTRAVENOUS | Status: DC
  Administered 2017-10-27: 4800 mg via INTRAVENOUS
  Filled 2017-10-27: qty 96

## 2017-10-27 MED ORDER — DEXAMETHASONE SODIUM PHOSPHATE 10 MG/ML IJ SOLN
10.0000 mg | Freq: Once | INTRAMUSCULAR | Status: AC
  Administered 2017-10-27: 10 mg via INTRAVENOUS
  Filled 2017-10-27: qty 1

## 2017-10-27 MED ORDER — FENTANYL 25 MCG/HR TD PT72: 25.0000 ug | MEDICATED_PATCH | TRANSDERMAL | 0 refills | Status: DC

## 2017-10-27 MED ORDER — LEUCOVORIN CALCIUM INJECTION 350 MG
400.0000 mg/m2 | Freq: Once | INTRAVENOUS | Status: AC
  Administered 2017-10-27: 800 mg via INTRAVENOUS
  Filled 2017-10-27: qty 25

## 2017-10-27 MED ORDER — OXALIPLATIN CHEMO INJECTION 100 MG/20ML
85.0000 mg/m2 | Freq: Once | INTRAVENOUS | Status: AC
  Administered 2017-10-27: 170 mg via INTRAVENOUS
  Filled 2017-10-27: qty 34

## 2017-10-27 MED ORDER — DEXTROSE 5 % IV SOLN
Freq: Once | INTRAVENOUS | Status: AC
  Administered 2017-10-27: 09:00:00 via INTRAVENOUS
  Filled 2017-10-27: qty 250

## 2017-10-27 MED ORDER — FLUOROURACIL CHEMO INJECTION 2.5 GM/50ML
400.0000 mg/m2 | Freq: Once | INTRAVENOUS | Status: AC
  Administered 2017-10-27: 800 mg via INTRAVENOUS
  Filled 2017-10-27: qty 16

## 2017-10-27 NOTE — Progress Notes (Signed)
Here for follow up. Needs Fentanyl patch renewal. Feels mild soreness lower left abd area.

## 2017-10-27 NOTE — Patient Instructions (Signed)

## 2017-10-28 ENCOUNTER — Encounter: Payer: Self-pay | Admitting: Emergency Medicine

## 2017-10-28 ENCOUNTER — Emergency Department: Payer: Medicare Other

## 2017-10-28 ENCOUNTER — Emergency Department
Admission: EM | Admit: 2017-10-28 | Discharge: 2017-10-29 | Disposition: A | Discharge status: Home / Self Care | Payer: Medicare Other | Attending: Emergency Medicine | Admitting: Emergency Medicine

## 2017-10-28 ENCOUNTER — Other Ambulatory Visit: Payer: Self-pay

## 2017-10-28 DIAGNOSIS — E119 Type 2 diabetes mellitus without complications: Secondary | ICD-10-CM | POA: Insufficient documentation

## 2017-10-28 DIAGNOSIS — R109 Unspecified abdominal pain: Secondary | ICD-10-CM | POA: Diagnosis not present

## 2017-10-28 DIAGNOSIS — R112 Nausea with vomiting, unspecified: Secondary | ICD-10-CM | POA: Diagnosis not present

## 2017-10-28 DIAGNOSIS — I499 Cardiac arrhythmia, unspecified: Secondary | ICD-10-CM | POA: Diagnosis not present

## 2017-10-28 DIAGNOSIS — T887XXA Unspecified adverse effect of drug or medicament, initial encounter: Secondary | ICD-10-CM | POA: Diagnosis not present

## 2017-10-28 DIAGNOSIS — C23 Malignant neoplasm of gallbladder: Secondary | ICD-10-CM | POA: Insufficient documentation

## 2017-10-28 DIAGNOSIS — Y658 Other specified misadventures during surgical and medical care: Secondary | ICD-10-CM | POA: Diagnosis not present

## 2017-10-28 DIAGNOSIS — I10 Essential (primary) hypertension: Secondary | ICD-10-CM | POA: Diagnosis not present

## 2017-10-28 DIAGNOSIS — R1110 Vomiting, unspecified: Secondary | ICD-10-CM | POA: Diagnosis present

## 2017-10-28 DIAGNOSIS — T451X5A Adverse effect of antineoplastic and immunosuppressive drugs, initial encounter: Secondary | ICD-10-CM | POA: Diagnosis not present

## 2017-10-28 DIAGNOSIS — Z7982 Long term (current) use of aspirin: Secondary | ICD-10-CM | POA: Insufficient documentation

## 2017-10-28 DIAGNOSIS — C7971 Secondary malignant neoplasm of right adrenal gland: Secondary | ICD-10-CM | POA: Diagnosis not present

## 2017-10-28 DIAGNOSIS — Z79899 Other long term (current) drug therapy: Secondary | ICD-10-CM | POA: Diagnosis not present

## 2017-10-28 HISTORY — DX: Malignant (primary) neoplasm, unspecified: C80.1

## 2017-10-28 HISTORY — DX: Type 2 diabetes mellitus without complications: E11.9

## 2017-10-28 LAB — CBC
HEMATOCRIT: 32 % — AB (ref 40.0–52.0)
Hemoglobin: 11.1 g/dL — ABNORMAL LOW (ref 13.0–18.0)
MCH: 33.2 pg (ref 26.0–34.0)
MCHC: 34.7 g/dL (ref 32.0–36.0)
MCV: 95.7 fL (ref 80.0–100.0)
PLATELETS: 265 10*3/uL (ref 150–440)
RBC: 3.34 MIL/uL — ABNORMAL LOW (ref 4.40–5.90)
RDW: 17.3 % — AB (ref 11.5–14.5)
WBC: 13.3 10*3/uL — ABNORMAL HIGH (ref 3.8–10.6)

## 2017-10-28 LAB — COMPREHENSIVE METABOLIC PANEL
ALBUMIN: 3.4 g/dL — AB (ref 3.5–5.0)
ALT: 24 U/L (ref 0–44)
AST: 33 U/L (ref 15–41)
Alkaline Phosphatase: 240 U/L — ABNORMAL HIGH (ref 38–126)
Anion gap: 12 (ref 5–15)
BILIRUBIN TOTAL: 0.6 mg/dL (ref 0.3–1.2)
BUN: 46 mg/dL — AB (ref 8–23)
CHLORIDE: 108 mmol/L (ref 98–111)
CO2: 21 mmol/L — ABNORMAL LOW (ref 22–32)
CREATININE: 1.4 mg/dL — AB (ref 0.61–1.24)
Calcium: 9.7 mg/dL (ref 8.9–10.3)
GFR calc Af Amer: 53 mL/min — ABNORMAL LOW (ref >60)
GFR calc non Af Amer: 46 mL/min — ABNORMAL LOW (ref >60)
GLUCOSE: 152 mg/dL — AB (ref 70–99)
Potassium: 4.5 mmol/L (ref 3.5–5.1)
Sodium: 141 mmol/L (ref 135–145)
Total Protein: 7.6 g/dL (ref 6.5–8.1)

## 2017-10-28 LAB — DIFFERENTIAL
Basophils Absolute: 0 10*3/uL (ref 0–0.1)
Basophils Relative: 0 %
Eosinophils Absolute: 0 10*3/uL (ref 0–0.7)
Eosinophils Relative: 0 %
LYMPHS ABS: 1.2 10*3/uL (ref 1.0–3.6)
LYMPHS PCT: 9 %
MONO ABS: 1.3 10*3/uL — AB (ref 0.2–1.0)
Monocytes Relative: 10 %
NEUTROS ABS: 10.9 10*3/uL — AB (ref 1.4–6.5)
NEUTROS PCT: 81 %

## 2017-10-28 LAB — LIPASE, BLOOD: LIPASE: 18 U/L (ref 11–51)

## 2017-10-28 LAB — SAMPLE TO BLOOD BANK

## 2017-10-28 MED ORDER — SODIUM CHLORIDE 0.9 % IV SOLN
Freq: Once | INTRAVENOUS | Status: AC
  Administered 2017-10-28: via INTRAVENOUS

## 2017-10-28 MED ORDER — SODIUM CHLORIDE 0.9 % IV SOLN
8.0000 mg | Freq: Once | INTRAVENOUS | Status: DC
  Filled 2017-10-28: qty 4

## 2017-10-28 MED ORDER — IOHEXOL 300 MG/ML  SOLN
75.0000 mL | Freq: Once | INTRAMUSCULAR | Status: AC | PRN
  Administered 2017-10-28: 75 mL via INTRAVENOUS

## 2017-10-28 MED ORDER — MORPHINE SULFATE (PF) 4 MG/ML IV SOLN: 4.0000 mg | Freq: Once | INTRAVENOUS | Status: DC

## 2017-10-28 MED ORDER — ONDANSETRON 4 MG PO TBDP
8.0000 mg | ORAL_TABLET | Freq: Once | ORAL | Status: AC
  Administered 2017-10-28: 8 mg via ORAL
  Filled 2017-10-28: qty 2

## 2017-10-28 MED ORDER — METOCLOPRAMIDE HCL 5 MG/ML IJ SOLN
10.0000 mg | Freq: Once | INTRAMUSCULAR | Status: AC
  Administered 2017-10-28: 10 mg via INTRAVENOUS
  Filled 2017-10-28: qty 2

## 2017-10-28 NOTE — Telephone Encounter (Signed)
Completed.  Please make available for pickup tomorrow.

## 2017-10-28 NOTE — ED Provider Notes (Signed)
[facility] Emergency Department Provider Note   ____________________________________________   None    (approximate)  I have reviewed the triage vital signs and the nursing notes.   HISTORY  Chief Complaint Emesis    Hpi Thomas David is a 82 y.o. male who has a history of gallbladder cancer.  He was seen yesterday and his Megace was stopped briefly and he was given a infusion of 5-FU which is going on now.  Patient began having vomiting and not feeling well after that.  He is vomited 5 times last 24 hours.  Has not been able to keep down any of his medicines or anything else.  He has right mid to lower abdominal pain.  Nurses notes that he had another chemical infusion yesterday and that was leucovorin.  Vision had Zofran 8 mg pill today at noon but vomited it up.  Saw the patient in [place] to see is anxious and thinking about going home.   Past Medical History:  Diagnosis Date  . Anxiety   . Arthritis   . Cancer ([place])    gallbladder CA  . Chronic lower back pain   . Crohn's disease ([place])   . Depression   . Difficult intubation    patient denies on 02/12/17  . GERD (gastroesophageal reflux disease)   . Gout   . History of kidney stones 40 years ago  . Hypercholesteremia   . Hypertension   . Prediabetes   . Sleep apnea    cpap     Patient Active Problem List   Diagnosis Date Noted  . Anemia 09/30/2017  . Dyspnea on exertion 06/03/2017  . Constipation 03/04/2017  . Goals of care, counseling/discussion 03/03/2017  . 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 Fistula of gallbladder 11/03/2016  . Proteinuria 01/06/2016  . Obstructive sleep apnea 10/04/2013  . Diabetes ([place]) 08/24/2012  . Pernicious anemia 08/24/2012  . BPH (benign prostatic hyperplasia) 05/26/2012  . Crohn's disease of small and large intestines ([place]) 06/23/2011  . Gout 06/23/2011  . Essential hypertension 06/23/2011  . Hyperlipemia 06/23/2011  . GERD  (gastroesophageal reflux disease) 06/23/2011    Past Surgical History:  Procedure Laterality Date  . CHOLECYSTECTOMY N/A 11/03/2016   Procedure: LAPAROSCOPIC CHOLECYSTostomy with tube placement ;  Surgeon: Johnathan Hausen, MD;  Location: [facility];  Service: General;  Laterality: N/A;  . CHOLECYSTECTOMY N/A 02/15/2017   Procedure: LAPAROSCOPIC SUBTOTAL CHOLECYSTECTOMY, BIOPSY OF GALLBLADDER;  Surgeon: Johnathan Hausen, MD;  Location: [facility];  Service: General;  Laterality: N/A;  . COLECTOMY  1973   "part of large intestines" (05/04/2012)  . COLON RESECTION    . COLONOSCOPY    . COLONOSCOPY N/A 05/23/2014   Procedure: COLONOSCOPY;  Surgeon: Rogene Houston, MD;  [facility];  Service: Endoscopy;  Laterality: N/A;  1200  . ESOPHAGEAL DILATION N/A 05/23/2014   Procedure: ESOPHAGEAL DILATION;  Surgeon: Rogene Houston, MD;  [facility];  Service: Endoscopy;  Laterality: N/A;  . ESOPHAGOGASTRODUODENOSCOPY N/A 03/16/2014   Procedure: esophageal dilation ;  Surgeon: Rogene Houston, MD;  [facility];  Service: Endoscopy;  Laterality: N/A;  . ESOPHAGOGASTRODUODENOSCOPY N/A 05/23/2014   Procedure: ESOPHAGOGASTRODUODENOSCOPY (EGD);  Surgeon: Rogene Houston, MD;  [facility];  Service: Endoscopy;  Laterality: N/A;  . ESOPHAGOGASTRODUODENOSCOPY (EGD) WITH PROPOFOL N/A 02/18/2017   Procedure: ESOPHAGOGASTRODUODENOSCOPY (EGD) WITH PROPOFOL;  Surgeon: Wonda Horner, MD;  [facility];  Service: Endoscopy;  Laterality: N/A;  .  [name]  . IR CHOLANGIOGRAM EXISTING TUBE  11/26/2016  . KIDNEY STONE SURGERY  1970's  . PARTIAL COLECTOMY  ~ 40 years ago   some of large and small  . PORTA CATH INSERTION N/A 03/17/2017   Procedure: PORTA CATH INSERTION;  Surgeon: Katha Cabal, MD;  Location: [facility];  Service: Cardiovascular;  Laterality: N/A;  . PROSTATE SURGERY    . THYROIDECTOMY Right 05/04/2012   Procedure: RIGHT HEMI  THYROIDECTOMY;  Surgeon: Ascencion Dike, MD;  Location: [facility];  Service: ENT;  Laterality: Right;  . THYROIDECTOMY    . TONSILLECTOMY AND ADENOIDECTOMY     as a child  . TRANSURETHRAL PROSTATECTOMY WITH GYRUS INSTRUMENTS N/A 07/11/2012   Procedure: TRANSURETHRAL PROSTATECTOMY WITH GYRUS INSTRUMENTS;  Surgeon: Claybon Jabs, MD;  [facility];  Service: Urology;  Laterality: N/A;  . UPPER GASTROINTESTINAL ENDOSCOPY      Prior to Admission medications   Medication Sig Start Date End Date Taking? Authorizing Provider  acetaminophen (TYLENOL) 500 MG tablet Take 1,000 mg by mouth every 4 (four) hours as needed for moderate pain or headache.     [provider]  allopurinol (ZYLOPRIM) 300 MG tablet Take 1 tablet (300 mg total) by mouth every morning. 04/01/17   Leone Haven, MD  amLODipine (NORVASC) 5 MG tablet TAKE 1 TABLET BY MOUTH  DAILY 04/01/17   Leone Haven, MD  aspirin EC 81 MG tablet Take 81 mg by mouth at bedtime.    [provider]  bisacodyl (DULCOLAX) 10 MG suppository Place 1 suppository (10 mg total) rectally daily as needed for moderate constipation. Patient not taking: Reported on 10/27/2017 02/11/17   Rogene Houston, MD  fentaNYL (DURAGESIC - DOSED MCG/HR) 25 MCG/HR patch Place 1 patch (25 mcg total) onto the skin every 3 (three) days. 10/27/17   Lloyd Huger, MD  fluticasone (FLONASE) 50 MCG/ACT nasal spray USE 1 SPRAY INTO BOTH  NOSTRILS DAILY 02/08/17   Leone Haven, MD  Histamine Dihydrochloride (AUSTRALIAN DREAM ARTHRITIS EX) Apply 1 application topically 2 (two) times daily as needed (back pain).     [provider]  HYDROcodone-acetaminophen (NORCO/VICODIN) 5-325 MG tablet Take 1-2 tablets by mouth every 4 (four) hours as needed for moderate pain. Patient not taking: Reported on 10/27/2017 07/21/17   Lloyd Huger, MD  lactulose (CHRONULAC) 10 GM/15ML solution Take 15 mLs (10 g total) by mouth daily. 07/16/17   Lloyd Huger, MD  losartan (COZAAR) 100 MG tablet Take 1 tablet (100 mg total) by mouth daily. 04/01/17   Leone Haven, MD  megestrol (MEGACE) 40 MG tablet Take 1 tablet (40 mg total) by mouth daily. 08/18/17   Lloyd Huger, MD  mesalamine (PENTASA) 500 MG CR capsule Take 2 capsules (1,000 mg total) by mouth 2 (two) times daily. 08/26/17   Setzer, Rona Ravens, NP  metoprolol tartrate (LOPRESSOR) 50 MG tablet Take 1 tablet (50 mg total) by mouth 2 (two) times daily. 04/01/17   Leone Haven, MD  Multiple Vitamin (MULTIVITAMIN WITH MINERALS) TABS tablet Take 2 tablets by mouth daily.    [provider]  ondansetron (ZOFRAN) 8 MG tablet Take 1 tablet (8 mg total) by mouth 2 (two) times daily. Patient not taking: Reported on 10/27/2017 06/03/17   Leone Haven, MD  oxybutynin (DITROPAN-XL) 5 MG 24 hr tablet TAKE 1 TABLET BY MOUTH AT  BEDTIME 10/14/17   Leone Haven,  MD  pantoprazole (PROTONIX) 40 MG tablet Take 1 tablet (40 mg total) by mouth daily. 08/10/17   Leone Haven, MD  Simethicone 180 MG CAPS Take 1 capsule (180 mg total) by mouth 3 (three) times daily as needed. 08/17/17   Rehman, Mechele Dawley, MD  simvastatin (ZOCOR) 20 MG tablet Take 1 tablet (20 mg total) by mouth daily. 04/01/17   Leone Haven, MD  vitamin B-12 (CYANOCOBALAMIN) 1000 MCG tablet Take 1,000 mcg by mouth 2 (two) times daily.    [provider]    Allergies Patient has no known allergies.  Family History  Problem Relation Age of Onset  . Diabetes Mother   . Stroke Father   . Healthy Daughter   . Healthy Son   . Heart disease Sister     Social History Social History   Tobacco Use  . Smoking status: Never Smoker  . Smokeless tobacco: Never Used  Substance Use Topics  . Alcohol use: No    Alcohol/week: 0.0 standard drinks  . Drug use: No    Review of Systems  Constitutional: No fever/chills Eyes: No visual changes. ENT: No sore throat. Cardiovascular: Denies chest  pain. Respiratory: Denies shortness of breath. Gastrointestinal:  abdominal pain.   nausea,  vomiting.  No diarrhea. constipation. Genitourinary: Negative for dysuria. Musculoskeletal: Negative for back pain. Skin: Negative for rash. Neurological: Negative for headaches, focal weakness   ____________________________________________   PHYSICAL EXAM:  VITAL SIGNS: ED Triage Vitals  Enc Vitals Group     BP 10/28/17 2006 116/63     Pulse Rate 10/28/17 2006 (!) 111     Resp 10/28/17 2006 20     Temp 10/28/17 2006 97.9 F (36.6 C)     Temp Source 10/28/17 2006 Oral     SpO2 10/28/17 2006 99 %     Weight 10/28/17 2006 177 lb (80.3 kg)     Height 10/28/17 2006 5' 9"  (1.753 m)     Head Circumference --      Peak Flow --      Pain Score 10/28/17 2011 5     Pain Loc --      Pain Edu? --      Excl. in [place]? --     Constitutional: Alert and oriented.  Uncomfortable and chronically ill-appearing Eyes: Conjunctivae are normal. . Head: Atraumatic. Nose: No congestion/rhinnorhea. Mouth/Throat: Mucous membranes are moist.  Oropharynx non-erythematous. Neck: No stridor.   Cardiovascular: Normal rate, regular rhythm. Grossly normal heart sounds.  Good peripheral circulation. Respiratory: Normal respiratory effort.  No retractions. Lungs CTAB. Gastrointestinal: Soft tender to palpation just below into the right of the umbilicus. No distention. No abdominal bruits. No CVA tenderness. Musculoskeletal: No lower extremity tenderness  Neurologic:  Normal speech and language. No gross focal neurologic deficits are appreciated.  Skin:  Skin is warm, dry and intact. No rash noted. Psychiatric: Mood and affect are normal. Speech and behavior are normal.  ____________________________________________   LABS (all labs ordered are listed, but only abnormal results are displayed)  Labs Reviewed  COMPREHENSIVE METABOLIC PANEL - Abnormal; Notable for the following components:      Result Value   CO2  21 (*)    Glucose, Bld 152 (*)    BUN 46 (*)    Creatinine, Ser 1.40 (*)    Albumin 3.4 (*)    Alkaline Phosphatase 240 (*)    GFR calc non Af Amer 46 (*)    GFR calc Af Amer 53 (*)  All other components within normal limits  CBC - Abnormal; Notable for the following components:   WBC 13.3 (*)    RBC 3.34 (*)    Hemoglobin 11.1 (*)    HCT 32.0 (*)    RDW 17.3 (*)    All other components within normal limits  LIPASE, BLOOD  URINALYSIS, COMPLETE (UACMP) WITH MICROSCOPIC  DIFFERENTIAL  DIFFERENTIAL   ____________________________________________  EKG   ____________________________________________  RADIOLOGY  ED MD interpretation:    Official radiology report(s): No results found.  ____________________________________________   PROCEDURES  Procedure(s) performed:   Procedures  Critical Care performed:   ____________________________________________   INITIAL IMPRESSION / ASSESSMENT AND PLAN / ED COURSE  Patient is still in [place].  It is over an hour after the end of my shift.  I will let Dr. Cherylann Banas and Dr. Rae Lips know about this patient.         ____________________________________________   FINAL CLINICAL IMPRESSION(S) / ED DIAGNOSES  Final diagnoses:  Abdominal pain, unspecified abdominal location     ED Discharge Orders    None       Note:  This document was prepared using Dragon voice recognition software and may include unintentional dictation errors.    Nena Polio, MD 10/28/17 2211

## 2017-10-28 NOTE — ED Triage Notes (Signed)
Pt presents to ED with c/o frequent vomiting (X5)  for the past 24 hours. Pt had new chemo infusion yesterday morning and upon completion pt MD placed him on a new chemo drug that is infused slowly at home with a pump over 48 hours. Pt states he has been unable to keep anything down. Pt denies any other symptoms.

## 2017-10-28 NOTE — ED Notes (Signed)
Pt states that he has been vomiting all day and not been able to sleep. Pt given ODT zofran and is feeling better.

## 2017-10-29 ENCOUNTER — Encounter
Admission: RE | Admit: 2017-10-29 | Discharge: 2017-10-29 | Disposition: A | Discharge status: Home / Self Care | Payer: Medicare Other | Source: Ambulatory Visit | Attending: Internal Medicine | Admitting: Internal Medicine

## 2017-10-29 ENCOUNTER — Other Ambulatory Visit: Payer: Self-pay

## 2017-10-29 ENCOUNTER — Inpatient Hospital Stay (HOSPITAL_BASED_OUTPATIENT_CLINIC_OR_DEPARTMENT_OTHER): Payer: Medicare Other | Admitting: Oncology

## 2017-10-29 ENCOUNTER — Inpatient Hospital Stay: Payer: Medicare Other

## 2017-10-29 VITALS — BP 113/78 | HR 120 | Temp 96.9°F | Resp 18

## 2017-10-29 DIAGNOSIS — C23 Malignant neoplasm of gallbladder: Secondary | ICD-10-CM | POA: Diagnosis not present

## 2017-10-29 DIAGNOSIS — G4730 Sleep apnea, unspecified: Secondary | ICD-10-CM | POA: Diagnosis not present

## 2017-10-29 DIAGNOSIS — R5383 Other fatigue: Secondary | ICD-10-CM | POA: Diagnosis not present

## 2017-10-29 DIAGNOSIS — I2510 Atherosclerotic heart disease of native coronary artery without angina pectoris: Secondary | ICD-10-CM | POA: Diagnosis not present

## 2017-10-29 DIAGNOSIS — E860 Dehydration: Secondary | ICD-10-CM | POA: Diagnosis not present

## 2017-10-29 DIAGNOSIS — M109 Gout, unspecified: Secondary | ICD-10-CM | POA: Diagnosis not present

## 2017-10-29 DIAGNOSIS — R112 Nausea with vomiting, unspecified: Secondary | ICD-10-CM

## 2017-10-29 DIAGNOSIS — R531 Weakness: Secondary | ICD-10-CM | POA: Diagnosis not present

## 2017-10-29 DIAGNOSIS — R110 Nausea: Secondary | ICD-10-CM | POA: Diagnosis not present

## 2017-10-29 DIAGNOSIS — R000 Tachycardia, unspecified: Secondary | ICD-10-CM

## 2017-10-29 DIAGNOSIS — E7800 Pure hypercholesterolemia, unspecified: Secondary | ICD-10-CM | POA: Diagnosis not present

## 2017-10-29 DIAGNOSIS — Z79899 Other long term (current) drug therapy: Secondary | ICD-10-CM | POA: Diagnosis not present

## 2017-10-29 DIAGNOSIS — Z5111 Encounter for antineoplastic chemotherapy: Secondary | ICD-10-CM | POA: Diagnosis not present

## 2017-10-29 DIAGNOSIS — E1165 Type 2 diabetes mellitus with hyperglycemia: Secondary | ICD-10-CM | POA: Diagnosis not present

## 2017-10-29 DIAGNOSIS — N281 Cyst of kidney, acquired: Secondary | ICD-10-CM | POA: Diagnosis not present

## 2017-10-29 DIAGNOSIS — M545 Low back pain: Secondary | ICD-10-CM | POA: Diagnosis not present

## 2017-10-29 DIAGNOSIS — C7971 Secondary malignant neoplasm of right adrenal gland: Secondary | ICD-10-CM | POA: Diagnosis not present

## 2017-10-29 DIAGNOSIS — I10 Essential (primary) hypertension: Secondary | ICD-10-CM | POA: Diagnosis not present

## 2017-10-29 DIAGNOSIS — Z7982 Long term (current) use of aspirin: Secondary | ICD-10-CM | POA: Diagnosis not present

## 2017-10-29 DIAGNOSIS — K5090 Crohn's disease, unspecified, without complications: Secondary | ICD-10-CM | POA: Diagnosis not present

## 2017-10-29 DIAGNOSIS — R188 Other ascites: Secondary | ICD-10-CM | POA: Diagnosis not present

## 2017-10-29 DIAGNOSIS — Z9049 Acquired absence of other specified parts of digestive tract: Secondary | ICD-10-CM | POA: Diagnosis not present

## 2017-10-29 DIAGNOSIS — D649 Anemia, unspecified: Secondary | ICD-10-CM | POA: Diagnosis not present

## 2017-10-29 DIAGNOSIS — I499 Cardiac arrhythmia, unspecified: Secondary | ICD-10-CM | POA: Diagnosis not present

## 2017-10-29 DIAGNOSIS — K219 Gastro-esophageal reflux disease without esophagitis: Secondary | ICD-10-CM | POA: Diagnosis not present

## 2017-10-29 DIAGNOSIS — K5730 Diverticulosis of large intestine without perforation or abscess without bleeding: Secondary | ICD-10-CM | POA: Diagnosis not present

## 2017-10-29 DIAGNOSIS — R918 Other nonspecific abnormal finding of lung field: Secondary | ICD-10-CM | POA: Diagnosis not present

## 2017-10-29 DIAGNOSIS — E875 Hyperkalemia: Secondary | ICD-10-CM | POA: Diagnosis not present

## 2017-10-29 LAB — COMPREHENSIVE METABOLIC PANEL
ALK PHOS: 213 U/L — AB (ref 38–126)
ALT: 21 U/L (ref 0–44)
AST: 23 U/L (ref 15–41)
Albumin: 2.8 g/dL — ABNORMAL LOW (ref 3.5–5.0)
Anion gap: 9 (ref 5–15)
BILIRUBIN TOTAL: 0.7 mg/dL (ref 0.3–1.2)
BUN: 44 mg/dL — ABNORMAL HIGH (ref 8–23)
CALCIUM: 8.5 mg/dL — AB (ref 8.9–10.3)
CO2: 18 mmol/L — AB (ref 22–32)
CREATININE: 1.24 mg/dL (ref 0.61–1.24)
Chloride: 111 mmol/L (ref 98–111)
GFR, EST NON AFRICAN AMERICAN: 53 mL/min — AB (ref >60)
Glucose, Bld: 150 mg/dL — ABNORMAL HIGH (ref 70–99)
Potassium: 3.6 mmol/L (ref 3.5–5.1)
SODIUM: 138 mmol/L (ref 135–145)
TOTAL PROTEIN: 6.5 g/dL (ref 6.5–8.1)

## 2017-10-29 LAB — CBC WITH DIFFERENTIAL/PLATELET
Basophils Absolute: 0 10*3/uL (ref 0–0.1)
Basophils Relative: 0 %
EOS ABS: 0 10*3/uL (ref 0–0.7)
Eosinophils Relative: 0 %
HEMATOCRIT: 28.7 % — AB (ref 40.0–52.0)
HEMOGLOBIN: 9.7 g/dL — AB (ref 13.0–18.0)
LYMPHS ABS: 0.5 10*3/uL — AB (ref 1.0–3.6)
LYMPHS PCT: 9 %
MCH: 32.6 pg (ref 26.0–34.0)
MCHC: 33.7 g/dL (ref 32.0–36.0)
MCV: 96.5 fL (ref 80.0–100.0)
MONOS PCT: 13 %
Monocytes Absolute: 0.7 10*3/uL (ref 0.2–1.0)
NEUTROS PCT: 78 %
Neutro Abs: 4.2 10*3/uL (ref 1.4–6.5)
Platelets: 191 10*3/uL (ref 150–440)
RBC: 2.98 MIL/uL — ABNORMAL LOW (ref 4.40–5.90)
RDW: 16.9 % — ABNORMAL HIGH (ref 11.5–14.5)
WBC: 5.4 10*3/uL (ref 3.8–10.6)

## 2017-10-29 LAB — URINALYSIS, COMPLETE (UACMP) WITH MICROSCOPIC
BILIRUBIN URINE: NEGATIVE
Bacteria, UA: NONE SEEN
GLUCOSE, UA: NEGATIVE mg/dL
Hgb urine dipstick: NEGATIVE
Ketones, ur: NEGATIVE mg/dL
Leukocytes, UA: NEGATIVE
Nitrite: NEGATIVE
Protein, ur: NEGATIVE mg/dL
SPECIFIC GRAVITY, URINE: 1.027 (ref 1.005–1.030)
pH: 5 (ref 5.0–8.0)

## 2017-10-29 MED ORDER — ONDANSETRON HCL 4 MG/2ML IJ SOLN
8.0000 mg | Freq: Once | INTRAMUSCULAR | Status: AC
  Administered 2017-10-29: 8 mg via INTRAVENOUS
  Filled 2017-10-29: qty 4

## 2017-10-29 MED ORDER — SODIUM CHLORIDE 0.9 % IV SOLN
Freq: Once | INTRAVENOUS | Status: AC
  Administered 2017-10-29: 14:00:00 via INTRAVENOUS
  Filled 2017-10-29: qty 250

## 2017-10-29 MED ORDER — HEPARIN SOD (PORK) LOCK FLUSH 100 UNIT/ML IV SOLN
500.0000 [IU] | Freq: Once | INTRAVENOUS | Status: AC | PRN
  Administered 2017-10-29: 500 [IU]
  Filled 2017-10-29: qty 5

## 2017-10-29 MED ORDER — DEXAMETHASONE SODIUM PHOSPHATE 10 MG/ML IJ SOLN
10.0000 mg | Freq: Once | INTRAMUSCULAR | Status: AC
  Administered 2017-10-29: 10 mg via INTRAVENOUS
  Filled 2017-10-29: qty 1

## 2017-10-29 MED ORDER — SODIUM CHLORIDE 0.9 % IV BOLUS
1000.0000 mL | Freq: Once | INTRAVENOUS | Status: AC
  Administered 2017-10-29: 1000 mL via INTRAVENOUS

## 2017-10-29 MED ORDER — ONDANSETRON 8 MG PO TBDP: 8.0000 mg | ORAL_TABLET | Freq: Three times a day (TID) | ORAL | 0 refills | Status: AC | PRN

## 2017-10-29 MED ORDER — SODIUM CHLORIDE 0.9 % IV SOLN: Freq: Once | INTRAVENOUS | Status: DC

## 2017-10-29 MED ORDER — PROMETHAZINE HCL 25 MG RE SUPP: 25.0000 mg | Freq: Four times a day (QID) | RECTAL | 0 refills | Status: DC | PRN

## 2017-10-29 NOTE — Telephone Encounter (Signed)
Called pt and left a detailed VM. Advised pt form is ready for pick up and it has been placed up front.

## 2017-10-29 NOTE — Progress Notes (Signed)
[name] arrived to get his pump off today. Patient complains of weakness, nausea, and lack of appetite. He mentioned that he was sent home from the ED at 0230 this morning for fluids. Notified [name] and patient was ordered 1L of NS. Symptom management nurse aware.

## 2017-10-29 NOTE — Progress Notes (Signed)
Symptom Management Consult note [facility]  [phone] [phone]  Patient Care Team: Leone Haven, MD as PCP - General (Family Medicine) Kathyrn Drown, MD (Family Medicine) Clent Jacks, RN as Registered Nurse   Name of the patient: Thomas David  [id number]  07-Mar-1935   Date of visit: 10/29/2017  Diagnosis: 1. Dehydration - CBC with Differential/Platelet; Future - Comprehensive metabolic panel; Future   Chief Complaint: dehydration, weakness, malnutrition, nausea with vomiting  Current Treatment: s/p cycle 1 FOLFOX q 14 days. Last given on 10/27/17.   Oncology History: Patient was seen and evaluated by medical oncologist [name] on 10/27/2017 prior to cycle 1 FOLFOX.  At that visit he was doing well but did complain of chronic weakness and fatigue.  Labs were adequate for treatment.   Last evening around 7 PM patient's daughter escorted him to the emergency department d/t nausea with vomiting not controlled with current antiemetics.  While in the emergency room he was given 2 L fluid and antinausea medications.  CT scan for intermittent abdominal pain was not convincing for any acute etiology.  Labs revealed elevated kidney function likely due to dehydration and elevated white count.  He was not febrile.  He was discharged home.   On 10/13/2017 patient presented for results of recent PET scan.  Unfortunately, PET scan revealed significant progression of disease.  They discussed hospice and end-of-life care but patient has opted to pursue second line treatment with FOLFOX.    Oncology History   Patient with initial diagnosis in January 2019 where he was admitted to the hospital for intermittent abdominal pain for several months.  This was thought to be related to his gallbladder he had surgery in October which was unsuccessful secondary to significant inflammation.  A drain was placed.  Had a cholecystectomy in December 2018  which revealed adenocarcinoma.  Had positive margins with disease on serosal surface.  Pet scans from March 19, 2017 confirming stage IV disease with omental and liver metastasis.  Plan is to continue palliative chemotherapy using gemcitabine and cisplatin Day 1 and gemcitabine only on days 8 and 15.  Cisplatin has been dose reduced due to difficulties during cycle 1.  He is scheduled for restaging PET/CT on July 06, 2017.  Pet Scan from 07/06/17 revealed improvement of hepatic metastasis decrease in metabolic activity of right adrenal gland metastasis and resolution of metabolic activity and left adrenal gland metastasis mild decrease in metabolic activity of omental metastasis disease.  Several new small pulmonary nodules in left lower lobe hypermetabolic.     Adenocarcinoma of gallbladder ([place])   02/19/2017 Initial Diagnosis    Adenocarcinoma of gallbladder ([place])    10/20/2017 - 10/27/2017 Chemotherapy    The patient had palonosetron (ALOXI) injection 0.25 mg, 0.25 mg, Intravenous,  Once, 0 of 8 cycles oxaliplatin (ELOXATIN) 170 mg in dextrose 5 % 500 mL chemo infusion, 85 mg/m2 = 170 mg (100 % of original dose 85 mg/m2), Intravenous,  Once, 0 of 8 cycles Dose modification: 85 mg/m2 (original dose 85 mg/m2, Cycle 1, Reason: Provider Judgment)  for chemotherapy treatment.     10/26/2017 -  Chemotherapy    The patient had palonosetron (ALOXI) injection 0.25 mg, 0.25 mg, Intravenous,  Once, 1 of 6 cycles Administration: 0.25 mg (10/27/2017) leucovorin 800 mg in dextrose 5 % 250 mL infusion, 400 mg/m2 = 800 mg, Intravenous,  Once, 1 of 6 cycles Administration: 800 mg (10/27/2017) oxaliplatin (ELOXATIN) 170 mg in dextrose 5 %  500 mL chemo infusion, 85 mg/m2 = 170 mg, Intravenous,  Once, 1 of 6 cycles Administration: 170 mg (10/27/2017) fluorouracil (ADRUCIL) chemo injection 800 mg, 400 mg/m2 = 800 mg, Intravenous,  Once, 1 of 6 cycles Administration: 800 mg (10/27/2017) fluorouracil (ADRUCIL) 4,800  mg in sodium chloride 0.9 % 54 mL chemo infusion, 2,400 mg/m2 = 4,800 mg, Intravenous, 1 Day/Dose, 1 of 6 cycles Administration: 4,800 mg (10/27/2017)  for chemotherapy treatment.      Subjective Data:  Subjective:     Thomas David is a 82 y.o. male who presents for evaluation of nausea, vomiting, and dehydration and pump removal.  It has been present for 2 days.  Associated signs & symptoms:  moderate abdominal pain and inability to keep down fluids.  Daughter present at bedside states he is improved since last night but continues to be significantly weak with no energy and dizzy spells.  Symptoms have been present since chemotherapy on Wednesday.  He has been unable to keep anything down.  Taking antiemetics as prescribed.  Denies fevers.  Admits to intermittent left upper quadrant abdominal pain that resolved with bowel movement this morning.  Has been unable to take daily medications by mouth due to nausea.  As[facility] given he does not feel safe at home alone.  The following portions of the patient's history were reviewed and updated as appropriate: allergies, current medications, past family history, past medical history, past social history, past surgical history and problem list. Review of Systems A comprehensive review of systems was negative except for: Constitutional: positive for anorexia, fatigue, malaise and sweats Gastrointestinal: positive for abdominal pain, jaundice, nausea and vomiting Musculoskeletal: positive for muscle weakness and myalgias Neurological: positive for coordination problems, dizziness, gait problems and weakness       Objective:    There were no vitals taken for this visit. General appearance: alert, mild distress and pale Lungs: clear to auscultation bilaterally Chest wall: no tenderness Heart: irregularly irregular rhythm Abdomen: soft, non-tender; bowel sounds normal; no masses,  no  organomegaly Skin: Skin color, texture, turgor normal. No rashes or lesions    Assessment:      Dehydration with nausea and vomiting d/t recent chemotherapy and progression of adenocarcinoma of gall bladder. Currently on 2nd line therapy.  Cycle 1 FOLFOX.   Plan:    Antiemetics per medication orders. IV fluids per orders. Labs per orders. PRN antiemetic per medication orders.    Gallbladder cancer: Noted to have recent progression on PET scan.  Started second line chemotherapy with FOLFOX.  Cycle 1 given on 10/27/2017.  Scheduled to return to clinic on 11/03/2017 for labs and assessment of her first cycle.  Has scheduled follow-up with [name] with labs and assessment on 11/03/2017.   Nausea with vomiting: likely d/t recent chemo. Patient was seen in the emergency room last night and received IV Zofran.  He received a second dose today while in clinic.  We will provide a prescription for ODT Zofran and Phenergan suppositories 25 mg.   Dehydration: d/t nausea and vomiting.  Kidney function has improved after receiving 3 L fluids over the course of the past 24 hours.  Will receive fluids as mentioned above.  He will need to stay hydrated.  Samples of Pedialyte and Ensure clears provided today.  He will need repeat labs every 2 days (CBC,CMP) given severe nausea and vomiting.   Weakness: Patient significantly weak and deconditioned d/t side effects  from chemotherapy.  Daughter is concerned that he may fall and hurt himself at home.  She is wanting him to either be admitted to the hospital or to assisted living facility at the [facility].  Patient is stable for outpatient. spoke with Education officer, museum here at [facility] [employer]) who walked me through the process of  outpatient [facility] paperwork.  Filled out paperwork with attached medication list and most recent progress note.  Patient's daughter and Hal Hope have both spoken to admissions at [facility].  Anemia/thrombocytopenia:  Improved.  Labs look surprisingly good today.  Tachycardia: Likely due to dehydration.  EKG looked ok. Baseline. Sinus Tachycardia. HR improved.   Greater than 50% was spent in counseling and coordination of care with this patient including but not limited to discussion of the relevant topics above (See A&P) including, but not limited to diagnosis and management of acute and chronic medical conditions.   Faythe Casa, NP 10/29/2017 3:37 PM

## 2017-11-01 ENCOUNTER — Other Ambulatory Visit: Payer: Self-pay

## 2017-11-01 ENCOUNTER — Telehealth: Payer: Self-pay

## 2017-11-01 DIAGNOSIS — C799 Secondary malignant neoplasm of unspecified site: Secondary | ICD-10-CM | POA: Diagnosis not present

## 2017-11-01 DIAGNOSIS — E860 Dehydration: Secondary | ICD-10-CM | POA: Diagnosis not present

## 2017-11-01 DIAGNOSIS — R112 Nausea with vomiting, unspecified: Secondary | ICD-10-CM | POA: Diagnosis not present

## 2017-11-01 MED ORDER — HYDROCODONE-ACETAMINOPHEN 5-325 MG PO TABS: 1.0000 | ORAL_TABLET | ORAL | 0 refills | Status: DC | PRN

## 2017-11-01 MED ORDER — FENTANYL 25 MCG/HR TD PT72: 25.0000 ug | MEDICATED_PATCH | TRANSDERMAL | 0 refills | Status: DC

## 2017-11-01 NOTE — Telephone Encounter (Signed)
Copied from [place] [id number]. Topic: Inquiry >> Nov 01, 2017  9:13 AM Judyann Munson wrote: Reason for CRM:  patient was advise he  could pick up his [employer] parking pass when he came for his AWV on  11-02-17 due to chemo reaction  he has cancel the appt and he is requesting to have his  parking pass mailed to: [address], [place] [address].   Please advise  Handicap parking sticker form has been put in the mail for patient

## 2017-11-01 NOTE — Telephone Encounter (Signed)
Rx sent to [facility] phone : [phone] , fax : [phone]

## 2017-11-02 ENCOUNTER — Ambulatory Visit: Payer: Medicare Other

## 2017-11-02 ENCOUNTER — Encounter: Payer: Self-pay | Admitting: Adult Health

## 2017-11-02 ENCOUNTER — Encounter
Admission: RE | Admit: 2017-11-02 | Discharge: 2017-11-02 | Disposition: A | Discharge status: Home / Self Care | Payer: Medicare Other | Source: Ambulatory Visit | Attending: Internal Medicine | Admitting: Internal Medicine

## 2017-11-02 NOTE — Progress Notes (Signed)
Entered in error

## 2017-11-03 ENCOUNTER — Other Ambulatory Visit: Payer: Self-pay

## 2017-11-03 ENCOUNTER — Inpatient Hospital Stay (HOSPITAL_BASED_OUTPATIENT_CLINIC_OR_DEPARTMENT_OTHER): Payer: Medicare Other | Admitting: Oncology

## 2017-11-03 ENCOUNTER — Encounter: Payer: Self-pay | Admitting: Emergency Medicine

## 2017-11-03 ENCOUNTER — Inpatient Hospital Stay: Payer: Medicare Other | Attending: Oncology

## 2017-11-03 ENCOUNTER — Emergency Department
Admission: EM | Admit: 2017-11-03 | Discharge: 2017-11-03 | Disposition: A | Discharge status: Home / Self Care | Payer: Medicare Other | Attending: Emergency Medicine | Admitting: Emergency Medicine

## 2017-11-03 ENCOUNTER — Inpatient Hospital Stay: Payer: Medicare Other

## 2017-11-03 VITALS — BP 94/62 | HR 88 | Temp 94.9°F | Resp 18 | Wt 174.8 lb

## 2017-11-03 DIAGNOSIS — E860 Dehydration: Secondary | ICD-10-CM | POA: Insufficient documentation

## 2017-11-03 DIAGNOSIS — R410 Disorientation, unspecified: Secondary | ICD-10-CM | POA: Diagnosis not present

## 2017-11-03 DIAGNOSIS — R944 Abnormal results of kidney function studies: Secondary | ICD-10-CM | POA: Diagnosis not present

## 2017-11-03 DIAGNOSIS — C23 Malignant neoplasm of gallbladder: Secondary | ICD-10-CM | POA: Insufficient documentation

## 2017-11-03 DIAGNOSIS — C7971 Secondary malignant neoplasm of right adrenal gland: Secondary | ICD-10-CM

## 2017-11-03 DIAGNOSIS — R531 Weakness: Secondary | ICD-10-CM

## 2017-11-03 DIAGNOSIS — F5089 Other specified eating disorder: Secondary | ICD-10-CM | POA: Diagnosis not present

## 2017-11-03 DIAGNOSIS — R110 Nausea: Secondary | ICD-10-CM

## 2017-11-03 DIAGNOSIS — I10 Essential (primary) hypertension: Secondary | ICD-10-CM | POA: Diagnosis not present

## 2017-11-03 DIAGNOSIS — E43 Unspecified severe protein-calorie malnutrition: Secondary | ICD-10-CM | POA: Diagnosis not present

## 2017-11-03 DIAGNOSIS — R112 Nausea with vomiting, unspecified: Secondary | ICD-10-CM | POA: Diagnosis not present

## 2017-11-03 DIAGNOSIS — E119 Type 2 diabetes mellitus without complications: Secondary | ICD-10-CM | POA: Diagnosis not present

## 2017-11-03 DIAGNOSIS — Z66 Do not resuscitate: Secondary | ICD-10-CM | POA: Diagnosis not present

## 2017-11-03 DIAGNOSIS — I959 Hypotension, unspecified: Secondary | ICD-10-CM

## 2017-11-03 DIAGNOSIS — R61 Generalized hyperhidrosis: Secondary | ICD-10-CM | POA: Diagnosis not present

## 2017-11-03 DIAGNOSIS — D649 Anemia, unspecified: Secondary | ICD-10-CM | POA: Diagnosis not present

## 2017-11-03 DIAGNOSIS — F419 Anxiety disorder, unspecified: Secondary | ICD-10-CM | POA: Diagnosis not present

## 2017-11-03 DIAGNOSIS — R109 Unspecified abdominal pain: Secondary | ICD-10-CM | POA: Diagnosis not present

## 2017-11-03 LAB — CBC WITH DIFFERENTIAL/PLATELET
BASOS ABS: 0 10*3/uL (ref 0–0.1)
BASOS ABS: 0 10*3/uL (ref 0–0.1)
Basophils Relative: 0 %
Basophils Relative: 0 %
EOS PCT: 1 %
Eosinophils Absolute: 0.2 10*3/uL (ref 0–0.7)
Eosinophils Absolute: 0.1 10*3/uL (ref 0–0.7)
Eosinophils Relative: 2 %
HEMATOCRIT: 31.2 % — AB (ref 40.0–52.0)
HEMATOCRIT: 28.9 % — AB (ref 40.0–52.0)
Hemoglobin: 10.6 g/dL — ABNORMAL LOW (ref 13.0–18.0)
Hemoglobin: 9.9 g/dL — ABNORMAL LOW (ref 13.0–18.0)
LYMPHS ABS: 0.6 10*3/uL — AB (ref 1.0–3.6)
LYMPHS PCT: 11 %
LYMPHS PCT: 6 %
Lymphs Abs: 1 10*3/uL (ref 1.0–3.6)
MCH: 32.5 pg (ref 26.0–34.0)
MCH: 32.4 pg (ref 26.0–34.0)
MCHC: 34 g/dL (ref 32.0–36.0)
MCHC: 34.1 g/dL (ref 32.0–36.0)
MCV: 95.4 fL (ref 80.0–100.0)
MCV: 95.1 fL (ref 80.0–100.0)
MONO ABS: 0.6 10*3/uL (ref 0.2–1.0)
MONO ABS: 0.7 10*3/uL (ref 0.2–1.0)
MONOS PCT: 7 %
Monocytes Relative: 7 %
NEUTROS ABS: 7.5 10*3/uL — AB (ref 1.4–6.5)
NEUTROS ABS: 8.1 10*3/uL — AB (ref 1.4–6.5)
Neutrophils Relative %: 80 %
Neutrophils Relative %: 86 %
PLATELETS: 138 10*3/uL — AB (ref 150–440)
Platelets: 178 10*3/uL (ref 150–440)
RBC: 3.27 MIL/uL — ABNORMAL LOW (ref 4.40–5.90)
RBC: 3.04 MIL/uL — AB (ref 4.40–5.90)
RDW: 16.3 % — AB (ref 11.5–14.5)
RDW: 16.2 % — AB (ref 11.5–14.5)
WBC: 9.4 10*3/uL (ref 3.8–10.6)
WBC: 9.4 10*3/uL (ref 3.8–10.6)

## 2017-11-03 LAB — COMPREHENSIVE METABOLIC PANEL
ALT: 16 U/L (ref 0–44)
ALT: 16 U/L (ref 0–44)
AST: 20 U/L (ref 15–41)
AST: 23 U/L (ref 15–41)
Albumin: 3 g/dL — ABNORMAL LOW (ref 3.5–5.0)
Albumin: 2.8 g/dL — ABNORMAL LOW (ref 3.5–5.0)
Alkaline Phosphatase: 211 U/L — ABNORMAL HIGH (ref 38–126)
Alkaline Phosphatase: 197 U/L — ABNORMAL HIGH (ref 38–126)
Anion gap: 12 (ref 5–15)
Anion gap: 10 (ref 5–15)
BILIRUBIN TOTAL: 0.8 mg/dL (ref 0.3–1.2)
BUN: 39 mg/dL — AB (ref 8–23)
BUN: 41 mg/dL — AB (ref 8–23)
CHLORIDE: 106 mmol/L (ref 98–111)
CO2: 20 mmol/L — ABNORMAL LOW (ref 22–32)
CO2: 20 mmol/L — ABNORMAL LOW (ref 22–32)
CREATININE: 1.79 mg/dL — AB (ref 0.61–1.24)
Calcium: 8.7 mg/dL — ABNORMAL LOW (ref 8.9–10.3)
Calcium: 8.4 mg/dL — ABNORMAL LOW (ref 8.9–10.3)
Chloride: 104 mmol/L (ref 98–111)
Creatinine, Ser: 1.64 mg/dL — ABNORMAL HIGH (ref 0.61–1.24)
GFR calc Af Amer: 44 mL/min — ABNORMAL LOW (ref >60)
GFR calc Af Amer: 40 mL/min — ABNORMAL LOW (ref >60)
GFR, EST NON AFRICAN AMERICAN: 38 mL/min — AB (ref >60)
GFR, EST NON AFRICAN AMERICAN: 34 mL/min — AB (ref >60)
Glucose, Bld: 154 mg/dL — ABNORMAL HIGH (ref 70–99)
Glucose, Bld: 171 mg/dL — ABNORMAL HIGH (ref 70–99)
POTASSIUM: 3.6 mmol/L (ref 3.5–5.1)
Potassium: 3.8 mmol/L (ref 3.5–5.1)
Sodium: 136 mmol/L (ref 135–145)
Sodium: 136 mmol/L (ref 135–145)
TOTAL PROTEIN: 6.9 g/dL (ref 6.5–8.1)
TOTAL PROTEIN: 6.1 g/dL — AB (ref 6.5–8.1)
Total Bilirubin: 0.8 mg/dL (ref 0.3–1.2)

## 2017-11-03 LAB — SAMPLE TO BLOOD BANK

## 2017-11-03 LAB — GLUCOSE, CAPILLARY: Glucose-Capillary: 117 mg/dL — ABNORMAL HIGH (ref 70–99)

## 2017-11-03 LAB — TROPONIN I: TROPONIN I: 0.05 ng/mL — AB (ref <0.03)

## 2017-11-03 MED ORDER — SODIUM CHLORIDE 0.9 % IV SOLN
INTRAVENOUS | Status: AC
  Filled 2017-11-03: qty 250

## 2017-11-03 MED ORDER — ONDANSETRON HCL 4 MG/2ML IJ SOLN: 8.0000 mg | Freq: Once | INTRAMUSCULAR | Status: DC

## 2017-11-03 MED ORDER — DEXAMETHASONE SODIUM PHOSPHATE 10 MG/ML IJ SOLN: 10.0000 mg | Freq: Once | INTRAMUSCULAR | Status: AC

## 2017-11-03 MED ORDER — SODIUM CHLORIDE 0.9 % IV SOLN
1000.0000 mL | Freq: Once | INTRAVENOUS | Status: AC
  Administered 2017-11-03: 1000 mL via INTRAVENOUS

## 2017-11-03 MED ORDER — SODIUM CHLORIDE 0.9 % IV SOLN: Freq: Once | INTRAVENOUS | Status: DC

## 2017-11-03 MED ORDER — HEPARIN SOD (PORK) LOCK FLUSH 100 UNIT/ML IV SOLN
500.0000 [IU] | Freq: Once | INTRAVENOUS | Status: AC
  Administered 2017-11-03: 500 [IU] via INTRAVENOUS
  Filled 2017-11-03: qty 5

## 2017-11-03 MED ORDER — ONDANSETRON HCL 4 MG/2ML IJ SOLN
4.0000 mg | Freq: Once | INTRAMUSCULAR | Status: AC
  Administered 2017-11-03: 4 mg via INTRAVENOUS
  Filled 2017-11-03: qty 2

## 2017-11-03 NOTE — ED Notes (Signed)
Date and time results received: 11/03/17 1231  Test: Trop Critical Value: 0.05  Name of Provider Notified: Jimmye Norman MD  Orders Received? Or Actions Taken?: Will do EKG, Will cont to monitor

## 2017-11-03 NOTE — ED Triage Notes (Signed)
Pt presents to ED by stretcher with c/o AMS, weakness and diaphoresis. [facility] RN's who transported report that they were attempting to get patient out of chair and patient was noted to be extremely diaphoretic, pt is also confused, RN reports that patient was singing en route to ED.

## 2017-11-03 NOTE — ED Notes (Signed)
Pt states to EDP "I want to die". Pt able to speak in complete sentences with EDP, pt states "I dont' feel good, I can't enjoy life, so if I can't enjoy it anymore just let the good lord take me out".

## 2017-11-03 NOTE — Progress Notes (Signed)
[facility]  [phone] [phone]) [phone]  ID: Thomas David OB: 02/19/1935  MR#: [id number]  HFW#:[id number]  Patient Care Team: Leone Haven, MD as PCP - General (Family Medicine) Kathyrn Drown, MD (Family Medicine) Clent Jacks, RN as Registered Nurse  CHIEF COMPLAINT: Adenocarcinoma of gallbladder.  INTERVAL HISTORY: Patient comes to clinic today for further evaluation and assessment of cycle 1 of FOLFOX due to his progressive adenocarcinoma of gallbladder.  In the interim he was seen in the Emergency Dept on 10/28/17  in [facility] by me on 10/29/17 for weakness, dehydration and nausea with vomiting. This was thought to be due to recent chemotherapy that was given on 10/27/2017.  He was given a total of 3 L IV fluids and IV Zofran in and provided prescription for ODT Zofran and Phenergan suppositories.  Paperwork was filled out for higher level of care at [facility] from independent living to assisted living facility.  He has since been residing at the assisted living facility with no improvement.  His appetite is poor, generally weak and intermittent nausea.  Has dyspnea with exertion.  He denies any pain today.  He denies constipation or diarrhea.  He denies any recent fevers or illnesses.  He denies chest pain or urinary complaints.   REVIEW OF SYSTEMS:   Review of Systems  Constitutional: Positive for chills and malaise/fatigue. Negative for fever and weight loss.  HENT: Negative for congestion, ear pain and tinnitus.   Eyes: Negative.  Negative for blurred vision and double vision.  Respiratory: Negative.  Negative for cough, sputum production and shortness of breath.   Cardiovascular: Negative.  Negative for chest pain, palpitations and leg swelling.  Gastrointestinal: Positive for nausea and vomiting. Negative for abdominal pain, constipation and diarrhea.  Genitourinary: Negative for dysuria, frequency and urgency.  Musculoskeletal:  Negative for back pain and falls.  Skin: Negative.  Negative for rash.  Neurological: Positive for weakness. Negative for headaches.  Endo/Heme/Allergies: Negative.  Does not bruise/bleed easily.  Psychiatric/Behavioral: Negative.  Negative for depression. The patient is not nervous/anxious and does not have insomnia.      As per HPI. Otherwise, a complete review of systems is negative.  PAST MEDICAL HISTORY: Past Medical History:  Diagnosis Date  . Anxiety   . Arthritis   . Cancer ([place])    gallbladder CA  . Chronic lower back pain   . Crohn's disease ([place])   . Depression   . Diabetes mellitus without complication ([place])   . Difficult intubation    patient denies on 02/12/17  . GERD (gastroesophageal reflux disease)   . Gout   . History of kidney stones 40 years ago  . Hypercholesteremia   . Hypertension   . Prediabetes   . Sleep apnea    cpap     PAST SURGICAL HISTORY: Past Surgical History:  Procedure Laterality Date  . CHOLECYSTECTOMY N/A 11/03/2016   Procedure: LAPAROSCOPIC CHOLECYSTostomy with tube placement ;  Surgeon: Johnathan Hausen, MD;  Location: [facility];  Service: General;  Laterality: N/A;  . CHOLECYSTECTOMY N/A 02/15/2017   Procedure: LAPAROSCOPIC SUBTOTAL CHOLECYSTECTOMY, BIOPSY OF GALLBLADDER;  Surgeon: Johnathan Hausen, MD;  Location: [facility];  Service: General;  Laterality: N/A;  . COLECTOMY  1973   "part of large intestines" (05/04/2012)  . COLON RESECTION    . COLONOSCOPY    . COLONOSCOPY N/A 05/23/2014   Procedure: COLONOSCOPY;  Surgeon: Rogene Houston, MD;  [facility];  Service: Endoscopy;  Laterality: N/A;  1200  . ESOPHAGEAL DILATION N/A 05/23/2014   Procedure: ESOPHAGEAL DILATION;  Surgeon: Rogene Houston, MD;  [facility];  Service: Endoscopy;  Laterality: N/A;  . ESOPHAGOGASTRODUODENOSCOPY N/A 03/16/2014   Procedure: esophageal dilation ;  Surgeon: Rogene Houston, MD;  [facility];  Service: Endoscopy;   Laterality: N/A;  . ESOPHAGOGASTRODUODENOSCOPY N/A 05/23/2014   Procedure: ESOPHAGOGASTRODUODENOSCOPY (EGD);  Surgeon: Rogene Houston, MD;  [facility];  Service: Endoscopy;  Laterality: N/A;  . ESOPHAGOGASTRODUODENOSCOPY (EGD) WITH PROPOFOL N/A 02/18/2017   Procedure: ESOPHAGOGASTRODUODENOSCOPY (EGD) WITH PROPOFOL;  Surgeon: Wonda Horner, MD;  [facility];  Service: Endoscopy;  Laterality: N/A;  . [place]  . IR CHOLANGIOGRAM EXISTING TUBE  11/26/2016  . KIDNEY STONE SURGERY  1970's  . PARTIAL COLECTOMY  ~ 40 years ago   some of large and small  . PORTA CATH INSERTION N/A 03/17/2017   Procedure: PORTA CATH INSERTION;  Surgeon: Katha Cabal, MD;  Location: [facility];  Service: Cardiovascular;  Laterality: N/A;  . PROSTATE SURGERY    . THYROIDECTOMY Right 05/04/2012   Procedure: RIGHT HEMI THYROIDECTOMY;  Surgeon: Ascencion Dike, MD;  Location: [facility];  Service: ENT;  Laterality: Right;  . THYROIDECTOMY    . TONSILLECTOMY AND ADENOIDECTOMY     as a child  . TRANSURETHRAL PROSTATECTOMY WITH GYRUS INSTRUMENTS N/A 07/11/2012   Procedure: TRANSURETHRAL PROSTATECTOMY WITH GYRUS INSTRUMENTS;  Surgeon: Claybon Jabs, MD;  Location: [facility];  Service: Urology;  Laterality: N/A;  . UPPER GASTROINTESTINAL ENDOSCOPY      FAMILY HISTORY: Family History  Problem Relation Age of Onset  . Diabetes Mother   . Stroke Father   . Healthy Daughter   . Healthy Son   . Heart disease Sister     ADVANCED DIRECTIVES (Y/N):  N  HEALTH MAINTENANCE: Social History   Tobacco Use  . Smoking status: Never Smoker  . Smokeless tobacco: Never Used  Substance Use Topics  . Alcohol use: No    Alcohol/week: 0.0 standard drinks  . Drug use: No     Colonoscopy:  PAP:  Bone density:  Lipid panel:  No Known Allergies  No current facility-administered medications for this visit.    Current Outpatient Medications  Medication Sig Dispense Refill  .  acetaminophen (TYLENOL) 500 MG tablet Take 1,000 mg by mouth every 4 (four) hours as needed for moderate pain or headache.     . allopurinol (ZYLOPRIM) 300 MG tablet Take 1 tablet (300 mg total) by mouth every morning. 90 tablet 3  . amLODipine (NORVASC) 5 MG tablet TAKE 1 TABLET BY MOUTH  DAILY 90 tablet 3  . aspirin EC 81 MG tablet Take 81 mg by mouth at bedtime.    . bisacodyl (DULCOLAX) 10 MG suppository Place 1 suppository (10 mg total) rectally daily as needed for moderate constipation. 12 suppository 0  . fentaNYL (DURAGESIC - DOSED MCG/HR) 25 MCG/HR patch Place 1 patch (25 mcg total) onto the skin every 3 (three) days. 10 patch 0  . fluticasone (FLONASE) 50 MCG/ACT nasal spray USE 1 SPRAY INTO BOTH  NOSTRILS DAILY 32 g 0  . Histamine Dihydrochloride (AUSTRALIAN DREAM ARTHRITIS EX) Apply 1 application topically 2 (two) times daily as needed (back pain).     Marland Kitchen losartan (COZAAR) 100 MG tablet Take 1 tablet (100 mg total) by mouth daily. 90 tablet 3  . megestrol (MEGACE) 40 MG tablet Take  1 tablet (40 mg total) by mouth daily. 30 tablet 2  . mesalamine (PENTASA) 500 MG CR capsule Take 2 capsules (1,000 mg total) by mouth 2 (two) times daily. 360 capsule 3  . metoprolol tartrate (LOPRESSOR) 50 MG tablet Take 1 tablet (50 mg total) by mouth 2 (two) times daily. 180 tablet 3  . Multiple Vitamin (MULTIVITAMIN WITH MINERALS) TABS tablet Take 2 tablets by mouth daily.    . ondansetron (ZOFRAN) 8 MG tablet Take 1 tablet (8 mg total) by mouth 2 (two) times daily. 30 tablet 3  . oxybutynin (DITROPAN-XL) 5 MG 24 hr tablet TAKE 1 TABLET BY MOUTH AT  BEDTIME 90 tablet 1  . pantoprazole (PROTONIX) 40 MG tablet Take 1 tablet (40 mg total) by mouth daily. 90 tablet 1  . Simethicone 180 MG CAPS Take 1 capsule (180 mg total) by mouth 3 (three) times daily as needed.  0  . simvastatin (ZOCOR) 20 MG tablet Take 1 tablet (20 mg total) by mouth daily. 90 tablet 3  . vitamin B-12 (CYANOCOBALAMIN) 1000 MCG tablet  Take 1,000 mcg by mouth 2 (two) times daily.    Marland Kitchen HYDROcodone-acetaminophen (NORCO/VICODIN) 5-325 MG tablet Take 1-2 tablets by mouth every 4 (four) hours as needed for moderate pain. (Patient not taking: Reported on 11/03/2017) 120 tablet 0  . lactulose (CHRONULAC) 10 GM/15ML solution Take 15 mLs (10 g total) by mouth daily. (Patient not taking: Reported on 11/03/2017) 240 mL 1  . ondansetron (ZOFRAN ODT) 8 MG disintegrating tablet Take 1 tablet (8 mg total) by mouth every 8 (eight) hours as needed for nausea or vomiting. (Patient not taking: Reported on 11/03/2017) 20 tablet 0   Facility-Administered Medications Ordered in Other Visits  Medication Dose Route Frequency Provider Last Rate Last Dose  . 0.9 %  sodium chloride infusion   Intravenous Continuous Jacquelin Hawking, NP      . dexamethasone (DECADRON) injection 10 mg  10 mg Intravenous Once Faythe Casa E, NP      . heparin lock flush 100 unit/mL  500 Units Intravenous Once Jacquelin Hawking, NP      . ondansetron [facility]) injection 8 mg  8 mg Intravenous Once Faythe Casa E, NP      . sodium chloride flush (NS) 0.9 % injection 10 mL  10 mL Intravenous PRN Jacquelin Hawking, NP   10 mL at 07/12/17 1445    OBJECTIVE: Vitals:   11/03/17 0950  BP: 94/62  Pulse: 88  Resp: 18  Temp: (!) 94.9 F (34.9 C)     Body mass index is 25.81 kg/m.    ECOG FS:1 - Symptomatic but completely ambulatory  Physical Exam  Constitutional: He is oriented to person, place, and time. Vital signs are normal. He appears lethargic. He is easily aroused. He has a sickly appearance.  HENT:  Head: Normocephalic and atraumatic.  Eyes: Pupils are equal, round, and reactive to light.  Neck: Normal range of motion.  Cardiovascular: Normal rate, regular rhythm and normal heart sounds.  No murmur heard. Pulmonary/Chest: Effort normal and breath sounds normal. He has no wheezes.  Abdominal: Soft. Normal appearance and bowel sounds are normal. He exhibits no  distension. There is no tenderness.  Musculoskeletal: Normal range of motion. He exhibits no edema.  Neurological: He is oriented to person, place, and time and easily aroused. He appears lethargic.  Skin: Skin is warm and dry. No rash noted. There is pallor.  Psychiatric: Judgment normal.  Nursing note and  vitals reviewed.   LAB RESULTS:  Lab Results  Component Value Date   NA 136 11/03/2017   K 3.6 11/03/2017   CL 104 11/03/2017   CO2 20 (L) 11/03/2017   GLUCOSE 154 (H) 11/03/2017   BUN 39 (H) 11/03/2017   CREATININE 1.64 (H) 11/03/2017   CALCIUM 8.7 (L) 11/03/2017   PROT 6.9 11/03/2017   ALBUMIN 3.0 (L) 11/03/2017   AST 20 11/03/2017   ALT 16 11/03/2017   ALKPHOS 211 (H) 11/03/2017   BILITOT 0.8 11/03/2017   GFRNONAA 38 (L) 11/03/2017   GFRAA 44 (L) 11/03/2017    Lab Results  Component Value Date   WBC 9.4 11/03/2017   NEUTROABS 8.1 (H) 11/03/2017   HGB 9.9 (L) 11/03/2017   HCT 28.9 (L) 11/03/2017   MCV 95.1 11/03/2017   PLT 138 (L) 11/03/2017     STUDIES: Ct Abdomen Pelvis W Contrast  Result Date: 10/28/2017 CLINICAL DATA:  82 year old male with history of gallbladder carcinoma. Patient presenting with vomiting. EXAM: CT ABDOMEN AND PELVIS WITH CONTRAST TECHNIQUE: Multidetector CT imaging of the abdomen and pelvis was performed using the standard protocol following bolus administration of intravenous contrast. CONTRAST:  46m OMNIPAQUE IOHEXOL 300 MG/ML  SOLN COMPARISON:  Abdominal CT dated 03/05/2017 and PET-CT dated 10/11/2017 FINDINGS: Lower chest: There are 2 nodules in the left lower lobe measuring up to 7 mm, 1 new, and the other increased in size since the prior CT concerning for metastatic disease. There is coronary vascular calcification. No intra-abdominal free air. Small ascites increased since the prior PET. Hepatobiliary: A 9 mm hypodense focus in the anterior liver (series 2, image 16) is not well characterized. Additional subcentimeter hypodense foci  noted in the liver. There is periportal edema or mild biliary ductal dilatation. The gallbladder is distended. There is a history of subtotal cholecystectomy. There is a focal area of thickening of the gallbladder wall adjacent to the second portion of the duodenum (series 5, image 33). Pancreas: The body and tail of the pancreas are atrophic with interval progression of atrophy since CT of 03/05/2017. Spleen: Normal in size without focal abnormality. Adrenals/Urinary Tract: A 4.7 x 5.3 cm heterogeneously enhancing right adrenal mass appears larger since the prior PET when it measured 4.6 x 4.9 cm. Left adrenal thickening. Bilateral renal cysts measure up to 9.4 cm in the upper pole of the right kidney. A 2 cm exophytic lesion from the posterior cortex of the right kidney demonstrates high attenuation, likely a complex or hemorrhagic cyst. There is no hydronephrosis on either side. There is symmetric enhancement of the kidneys. The urinary bladder is partially distended and grossly unremarkable. Stomach/Bowel: There is sigmoid diverticulosis without active inflammatory changes. Mildly thickened stomach and top-normal fluid-filled loops of small bowel may be related to ascites although gastroenteritis is not excluded. Clinical correlation is recommended. No evidence of bowel obstruction. Normal appendix. There is abutment of the [place] flexure of the colon to the gallbladder. Direct invasion of the colon by the gallbladder tumor was reported on the prior CT. Vascular/Lymphatic: Moderate aortoiliac atherosclerotic disease. No portal venous gas. There is no retroperitoneal adenopathy. There is diffuse omental nodularity and caking. Diffuse mesenteric edema. Reproductive: Enlarged prostate gland with probable postprocedural changes of TURP. The prostate measures 5.2 cm in transverse diameter. Other: Bilateral fat containing inguinal hernia, left greater right. Musculoskeletal: Degenerative changes of the spine.  IMPRESSION: 1. Reactive thickening of the stomach versus gastroenteritis. Clinical correlation is recommended. No bowel obstruction. Normal appendix. 2. Small  ascites diffuse mesenteric edema, and omental nodularity and caking consistent with metastasis. 3. Abutment of the gallbladder to the hepatic flexure of the colon as well as second portion of the duodenum. Direct invasion of the colon was reported on the prior CT. 4. Atrophy of the body and tail of the pancreas, progressed since the prior CT. 5. Increased size of the right adrenal metastatic disease. 6. Increase in the size of the left lower lobe pulmonary nodules concerning for metastatic disease. Electronically Signed   By: Anner Crete M.D.   On: 10/28/2017 23:23   Nm Pet Image Restag (ps) Skull Base To Thigh  Result Date: 10/11/2017 CLINICAL DATA:  Subsequent treatment strategy for gallbladder adenocarcinoma. EXAM: NUCLEAR MEDICINE PET SKULL BASE TO THIGH TECHNIQUE: 10.0 mCi F-18 FDG was injected intravenously. Full-ring PET imaging was performed from the skull base to thigh after the radiotracer. CT data was obtained and used for attenuation correction and anatomic localization. Fasting blood glucose: 91 mg/dl COMPARISON:  Multiple exams, including 07/06/2017 FINDINGS: Mediastinal blood pool activity: SUV max 1.8 NECK: No significant abnormal hypermetabolic activity in this region. Incidental CT findings: Atherosclerotic calcification of the common carotid arteries. CHEST: A left supraclavicular node measuring 0.7 cm in short axis on image 66/3 has a maximum SUV 2.6. Right lower paratracheal node measuring 1.0 cm in short axis on image 97/3 has a maximum SUV of 3.7, formerly 2.3. Indistinct activity at the right hilum, maximum SUV 2.8. Diffuse esophageal activity noted, activity in the mid esophagus with maximum SUV of 6.3. This appearance could be from esophagitis or physiologic. Given the long segment of involvement, esophageal cancer is  unlikely. Chronic volume loss in the right upper lobe adjacent to the minor fissure without significant accentuated metabolic activity, unchanged from recent chest CT. 6 by 4 mm right upper lobe pulmonary nodule on image 82/3, no appreciable accentuated metabolic activity but below sensitive PET-CT size thresholds. 6 by 7 mm left lower lobe nodule on image 131/3, previously 4 by 5 mm on 07/12/2017, maximum SUV 1.4. Incidental CT findings: Coronary, aortic arch, and branch vessel atherosclerotic vascular disease. Mild cardiomegaly. Right Port-A-Cath tip: Cavoatrial junction. ABDOMEN/PELVIS: Worsening omental caking of tumor with worsening tumor nodularity in the left upper quadrant omentum as well, and worsening right adrenal metastatic lesion. The right upper quadrant omental caking has a maximum SUV of 6.2 (formerly about 4.3) and has a more solid appearance, measuring about 1.9 cm in width on image 180/3, previously 1.4 cm. The overall right adrenal mass measures 4.9 by 4.6 cm on image 149/3 (formerly 4.5 by 4.2 cm) with maximum SUV 10.6 (formerly 5.8). New hypermetabolic nodularity in the left upper quadrant has a maximum SUV of 4.0. There is a new tumor nodule in the left inguinal hernia with maximum SUV 4.3. New perisplenic and mild perihepatic ascites along with increased ascites in the paracolic gutters, probably malignant. High anorectal activity is most likely physiologic, strictly speaking anal malignancy cannot be excluded. Incidental CT findings: Bilateral renal cysts of varying complexity but are not hypermetabolic and accordingly favor benign cysts. Diffuse mesenteric edema, increased. Aortoiliac atherosclerotic vascular disease. Considerable prostatomegaly. Bilateral inguinal hernias contain adipose tissue, larger on the left the right, and the left likewise contains a small amount of tumor as detailed above. SKELETON: 7 mm small focus of sclerosis posteriorly in the T10 vertebral body with maximum  SUV 3.9. Incidental CT findings: Bridging spurring of both sacroiliac joints. Degenerative arthropathy of both hips. IMPRESSION: 1. Significant worsening of omental  caking of tumor with increase in solidity and activity in the dominant right upper quadrant omental caking; increased nodularity in the left upper quadrant omentum; and increase in ascites and mesenteric edema. 2. Significant increase in size and metabolic activity of the right adrenal metastatic lesion. 3. Mild left supraclavicular, and right paratracheal adenopathy in the chest could reflect early metastatic spread. Small pulmonary nodules are not appreciably hypermetabolic but are below sensitive PET-CT size thresholds; a left lower lobe nodule in particular on image 131/3 appears to have mildly enlarged compared to the prior exam, concerning for early metastatic disease and meriting surveillance. 4. There bilateral inguinal hernias; the left inguinal hernia contains a small hypermetabolic tumor nodule. 5. 7 mm small focus of sclerosis posteriorly in the T10 vertebral body is mildly hypermetabolic and likewise could represent a metastatic lesion. 6. Other imaging findings of potential clinical significance: Aortic Atherosclerosis (ICD10-I70.0). Coronary atherosclerosis with mild cardiomegaly. Bilateral renal cysts of varying complexity appear benign. Prostatomegaly. Electronically Signed   By: Van Clines M.D.   On: 10/11/2017 12:08    ASSESSMENT: Adenocarcinoma of gallbladder, MSI-stable.  PLAN:    1. Adenocarcinoma of gallbladder: Noted to have recent progression on PET scan from October 11, 2017.  Recently agreed to second line chemotherapy with FOLFOX.  Had cycle 1 on 10/27/2017 with DC of pump on 10/29/2017.  Tolerated chemotherapy poorly.  Has required several liters of fluids, antiemetics and transition from independent living to assisted living facility at the [facility].  Patient's children are from out of town but  daughter is currently visiting and concerned about continuing with chemotherapy.  Today, patient will require 1 L NaCl for elevated kidney function, hypotension and severe malnutrition.  2.  Nausea with vomiting: Prescribed ODT Zofran and Phenergan suppositories at last visit.  It does not appear to have helped. 3.  Weakness: He is extremely weak, lethargic but easily aroused.  He is very pale.  Concerned he may need to be evaluated in the emergency department.  Given labs are okay and vital signs are stable we will attempt to give fluids and reassess.  While transferring patient from clinic to infusion, patient became extremely diaphoretic and confused.  NP was called over to reassess.  Patient's vital signs remained stable throughout entire episode.  He was taken directly to the emergency department for further work-up and higher level of care.  [name] notified.  Greater than 50% was spent in counseling and coordination of care with this patient including but not limited to discussion of the relevant topics above (See A&P) including, but not limited to diagnosis and management of acute and chronic medical conditions.   Cancer Staging Adenocarcinoma of gallbladder [facility] [facility]) Staging form: Gallbladder, AJCC 8th Edition - Clinical stage from 03/03/2017: Stage IVB (cT3, cN0, cM1) - Signed by Lloyd Huger, MD on 03/23/2017   Jacquelin Hawking, NP   11/03/2017 12:25 PM

## 2017-11-03 NOTE — ED Provider Notes (Signed)
[facility] Emergency Department Provider Note       Time seen: ----------------------------------------- 11:03 AM on 11/03/2017 -----------------------------------------   I have reviewed the triage vital signs and the nursing notes.  HISTORY   Chief Complaint Weakness and Diaphoresis    HPI Thomas David is a 82 y.o. male with a history of anxiety, arthritis, gallbladder cancer, Crohn's disease, depression, diabetes, GERD, gout, hypertension, hyperlipidemia who presents to the ED for altered mental status, weakness and diaphoresis.  The cancer center nurse who transported reported they were attempted to get the patient out of a chair and she was noted to be extremely diaphoretic and confused.  Recently he was seen for vomiting.  Patient states he wants to die  Past Medical History:  Diagnosis Date  . Anxiety   . Arthritis   . Cancer ([place])    gallbladder CA  . Chronic lower back pain   . Crohn's disease ([place])   . Depression   . Diabetes mellitus without complication ([place])   . Difficult intubation    patient denies on 02/12/17  . GERD (gastroesophageal reflux disease)   . Gout   . History of kidney stones 40 years ago  . Hypercholesteremia   . Hypertension   . Prediabetes   . Sleep apnea    cpap     Patient Active Problem List   Diagnosis Date Noted  . Anemia 09/30/2017  . Dyspnea on exertion 06/03/2017  . Constipation 03/04/2017  . Goals of care, counseling/discussion 03/03/2017  . Adenocarcinoma of gallbladder ([place]) 02/19/2017  . Fistula of gallbladder 11/03/2016  . Proteinuria 01/06/2016  . Obstructive sleep apnea 10/04/2013  . Diabetes ([place]) 08/24/2012  . Pernicious anemia 08/24/2012  . BPH (benign prostatic hyperplasia) 05/26/2012  . Crohn's disease of small and large intestines ([place]) 06/23/2011  . Gout 06/23/2011  . Essential hypertension 06/23/2011  . Hyperlipemia 06/23/2011  . GERD (gastroesophageal reflux disease) 06/23/2011     Past Surgical History:  Procedure Laterality Date  . CHOLECYSTECTOMY N/A 11/03/2016   Procedure: LAPAROSCOPIC CHOLECYSTostomy with tube placement ;  Surgeon: Johnathan Hausen, MD;  Location: [facility];  Service: General;  Laterality: N/A;  . CHOLECYSTECTOMY N/A 02/15/2017   Procedure: LAPAROSCOPIC SUBTOTAL CHOLECYSTECTOMY, BIOPSY OF GALLBLADDER;  Surgeon: Johnathan Hausen, MD;  Location: [facility];  Service: General;  Laterality: N/A;  . COLECTOMY  1973   "part of large intestines" (05/04/2012)  . COLON RESECTION    . COLONOSCOPY    . COLONOSCOPY N/A 05/23/2014   Procedure: COLONOSCOPY;  Surgeon: Rogene Houston, MD;  [facility];  Service: Endoscopy;  Laterality: N/A;  1200  . ESOPHAGEAL DILATION N/A 05/23/2014   Procedure: ESOPHAGEAL DILATION;  Surgeon: Rogene Houston, MD;  [facility];  Service: Endoscopy;  Laterality: N/A;  . ESOPHAGOGASTRODUODENOSCOPY N/A 03/16/2014   Procedure: esophageal dilation ;  Surgeon: Rogene Houston, MD;  [facility];  Service: Endoscopy;  Laterality: N/A;  . ESOPHAGOGASTRODUODENOSCOPY N/A 05/23/2014   Procedure: ESOPHAGOGASTRODUODENOSCOPY (EGD);  Surgeon: Rogene Houston, MD;  [facility];  Service: Endoscopy;  Laterality: N/A;  . ESOPHAGOGASTRODUODENOSCOPY (EGD) WITH PROPOFOL N/A 02/18/2017   Procedure: ESOPHAGOGASTRODUODENOSCOPY (EGD) WITH PROPOFOL;  Surgeon: Wonda Horner, MD;  [facility];  Service: Endoscopy;  Laterality: N/A;  . [place]  . IR CHOLANGIOGRAM EXISTING TUBE  11/26/2016  . KIDNEY STONE SURGERY  1970's  . PARTIAL COLECTOMY  ~ 40 years ago   some of  large and small  . PORTA CATH INSERTION N/A 03/17/2017   Procedure: PORTA CATH INSERTION;  Surgeon: Katha Cabal, MD;  Location: [facility];  Service: Cardiovascular;  Laterality: N/A;  . PROSTATE SURGERY    . THYROIDECTOMY Right 05/04/2012   Procedure: RIGHT HEMI THYROIDECTOMY;  Surgeon: Ascencion Dike, MD;   Location: [facility];  Service: ENT;  Laterality: Right;  . THYROIDECTOMY    . TONSILLECTOMY AND ADENOIDECTOMY     as a child  . TRANSURETHRAL PROSTATECTOMY WITH GYRUS INSTRUMENTS N/A 07/11/2012   Procedure: TRANSURETHRAL PROSTATECTOMY WITH GYRUS INSTRUMENTS;  Surgeon: Claybon Jabs, MD;  Location: [facility];  Service: Urology;  Laterality: N/A;  . UPPER GASTROINTESTINAL ENDOSCOPY      Allergies Patient has no known allergies.  Social History Social History   Tobacco Use  . Smoking status: Never Smoker  . Smokeless tobacco: Never Used  Substance Use Topics  . Alcohol use: No    Alcohol/week: 0.0 standard drinks  . Drug use: No   Review of Systems Constitutional: Negative for fever.  Positive for poor appetite Cardiovascular: Negative for chest pain. Respiratory: Negative for shortness of breath. Gastrointestinal: Positive for abdominal pain Musculoskeletal: Negative for back pain. Skin: Negative for rash. Neurological: Positive for weakness Psychiatric: Positive for depression  All systems negative/normal/unremarkable except as stated in the HPI  ____________________________________________   PHYSICAL EXAM:  VITAL SIGNS: ED Triage Vitals [11/03/17 1103]  Enc Vitals Group     BP      Pulse      Resp      Temp      Temp src      SpO2      Weight 174 lb 12.8 oz (79.3 kg)     Height 5' 9"  (1.753 m)     Head Circumference      Peak Flow      Pain Score      Pain Loc      Pain Edu?      Excl. in [place]?    Constitutional: Alert and oriented.  Tearful, mild distress Eyes: Conjunctivae are normal. Normal extraocular movements. ENT   Head: Normocephalic and atraumatic.   Nose: No congestion/rhinnorhea.   Mouth/Throat: Mucous membranes are moist.   Neck: No stridor. Cardiovascular: Normal rate, regular rhythm. No murmurs, rubs, or gallops. Respiratory: Normal respiratory effort without tachypnea nor retractions. Breath sounds are clear and equal bilaterally. No  wheezes/rales/rhonchi. Gastrointestinal: Right-sided abdominal tenderness is noted, normal bowel sounds. Musculoskeletal: Nontender with normal range of motion in extremities. No lower extremity tenderness nor edema. Neurologic:  Normal speech and language. No gross focal neurologic deficits are appreciated.  Skin: Pallor and diaphoresis are noted Psychiatric: Depressed and tearful ____________________________________________  EKG: Interpreted by me.  Sinus rhythm rate 72 bpm, normal PR interval, normal QRS, normal QT  ____________________________________________  ED COURSE:  As part of my medical decision making, I reviewed the following data within the [facility] History obtained from family if available, nursing notes, old chart and ekg, as well as notes from prior ED visits. Patient presented for weakness and diaphoresis, we will assess with labs and imaging as indicated at this time.   Procedures ____________________________________________   LABS (pertinent positives/negatives)  Labs Reviewed  CBC WITH DIFFERENTIAL/PLATELET - Abnormal; Notable for the following components:      Result Value   RBC 3.04 (*)    Hemoglobin 9.9 (*)    HCT 28.9 (*)    RDW  16.2 (*)    Platelets 138 (*)    Neutro Abs 8.1 (*)    Lymphs Abs 0.6 (*)    All other components within normal limits  COMPREHENSIVE METABOLIC PANEL - Abnormal; Notable for the following components:   CO2 20 (*)    Glucose, Bld 171 (*)    BUN 41 (*)    Creatinine, Ser 1.79 (*)    Calcium 8.4 (*)    Total Protein 6.1 (*)    Albumin 2.8 (*)    Alkaline Phosphatase 197 (*)    GFR calc non Af Amer 34 (*)    GFR calc Af Amer 40 (*)    All other components within normal limits  TROPONIN I - Abnormal; Notable for the following components:   Troponin I 0.05 (*)    All other components within normal limits  GLUCOSE, CAPILLARY - Abnormal; Notable for the following components:   Glucose-Capillary 117 (*)     All other components within normal limits  URINALYSIS, COMPLETE (UACMP) WITH MICROSCOPIC  CBG MONITORING, ED  ____________________________________________  DIFFERENTIAL DIAGNOSIS   Dehydration, electrolyte abnormality, depression, occult infection, cancer progression  FINAL ASSESSMENT AND PLAN  Weakness   Plan: The patient had presented for weakness. Patient's labs did not reveal any significant change from his prior lab work.  Patient states he does not want any further treatment for his cancer which I think is reasonable.  He is going to go back to [facility] where he can receive fluids as needed.  He has prescriptions for Zofran and Phenergan already written for.  He denies any other complaints and family is in agreement with this plan.   Laurence Aly, MD   Note: This note was generated in part or whole with voice recognition software. Voice recognition is usually quite accurate but there are transcription errors that can and very often do occur. I apologize for any typographical errors that were not detected and corrected.     Earleen Newport, MD 11/03/17 1335

## 2017-11-03 NOTE — ED Notes (Signed)
DC instructions reviewed with pt and pts daughter. Upon dc pt alert & oriented x3. ABCs intact. VSS. NAD. Pt ambulated with steady gait to and from bathroom in room. Pt then wheeled to front of ED where daughter taking pt back [facility] [place].

## 2017-11-03 NOTE — ED Notes (Signed)
Blood sugar 117

## 2017-11-03 NOTE — ED Notes (Signed)
Williams MD at bedside

## 2017-11-03 NOTE — Progress Notes (Signed)
Here for follow up. In w/c -weak and nauseaous. No vomiting except for this am- x 4 this am. C/o diff swallowing -no pain he stated. Feeling poorly x  Days   Eating iced chips, gatorade, popscicles. Little rice /ham last eve-that he stated he has to force down. Nausea x 6-7 d per pt. Pale and cool to touch this am.

## 2017-11-03 NOTE — ED Notes (Addendum)
PT FROM [facility]

## 2017-11-04 ENCOUNTER — Telehealth: Payer: Self-pay | Admitting: *Deleted

## 2017-11-04 NOTE — Telephone Encounter (Signed)
K. I will call her.   Faythe Casa, NP 11/04/2017 12:36 PM

## 2017-11-04 NOTE — Telephone Encounter (Signed)
Daughter called stating she has questions she forgot to ask yesterday and would like for Lorretta Harp, NP to return her call. [phone], or [phone].

## 2017-11-05 ENCOUNTER — Inpatient Hospital Stay: Payer: Medicare Other

## 2017-11-05 ENCOUNTER — Other Ambulatory Visit: Payer: Self-pay | Admitting: Oncology

## 2017-11-05 NOTE — Progress Notes (Signed)
Patient scheduled to have fluids today for continued dehydration d/t most recent cycle of FOLFOX.  He continues to be very weak.  Currently residing in assisted living facility at the [facility].  Daughter, Cecelia called and they are able to give IV fluids at the facility but they need orders. Have spoken with [name] and he is in agreement with plan to have fluids administered at the [facility].   Paperwork faxed over this morning at 8:30am. Will cancel fluids today here at the [facility].    Faythe Casa, NP 11/05/2017 8:40 AM

## 2017-11-08 NOTE — Progress Notes (Signed)
[facility]  [phone] [phone]) [phone]  ID: Thomas David OB: 06-03-1935  MR#: [id number]  MHD#:[id number]  Patient Care Team: Leone Haven, MD as PCP - General (Family Medicine) Kathyrn Drown, MD (Family Medicine) Clent Jacks, RN as Registered Nurse  CHIEF COMPLAINT: Adenocarcinoma of gallbladder.  INTERVAL HISTORY: Patient returns to clinic today for further evaluation.  He received 1 dose of FOLFOX approximately 2 weeks ago for his progressive adenocarcinoma of the gallbladder and his performance status has significantly declined since.  He has increased weakness and fatigue.  He has persistent shortness of breath.  He has a poor appetite. He does not complain of constipation or diarrhea today.  He does not complain of pain today.  He has no neurologic complaints.  He denies any recent fevers or illnesses. He denies any chest pain.  He has no urinary complaints.  Patient feels generally terrible, but offers no further specific complaints today.  REVIEW OF SYSTEMS:   Review of Systems  Constitutional: Positive for malaise/fatigue. Negative for fever and weight loss.  Eyes: Negative for blurred vision.  Respiratory: Positive for shortness of breath. Negative for cough.   Cardiovascular: Negative.  Negative for chest pain and leg swelling.  Gastrointestinal: Negative.  Negative for abdominal pain, blood in stool, constipation, heartburn, melena and nausea.  Genitourinary: Negative.  Negative for dysuria and frequency.  Musculoskeletal: Negative.  Negative for falls.  Skin: Negative.  Negative for rash.  Neurological: Positive for weakness. Negative for sensory change, focal weakness and headaches.  Psychiatric/Behavioral: Negative.  The patient is not nervous/anxious.     As per HPI. Otherwise, a complete review of systems is negative.  PAST MEDICAL HISTORY: Past Medical History:  Diagnosis Date  . Anxiety   . Arthritis   . Cancer  ([place])    gallbladder CA  . Chronic lower back pain   . Crohn's disease ([place])   . Depression   . Diabetes mellitus without complication ([place])   . Difficult intubation    patient denies on 02/12/17  . GERD (gastroesophageal reflux disease)   . Gout   . History of kidney stones 40 years ago  . Hypercholesteremia   . Hypertension   . Prediabetes   . Sleep apnea    cpap     PAST SURGICAL HISTORY: Past Surgical History:  Procedure Laterality Date  . CHOLECYSTECTOMY N/A 11/03/2016   Procedure: LAPAROSCOPIC CHOLECYSTostomy with tube placement ;  Surgeon: Johnathan Hausen, MD;  Location: [facility];  Service: General;  Laterality: N/A;  . CHOLECYSTECTOMY N/A 02/15/2017   Procedure: LAPAROSCOPIC SUBTOTAL CHOLECYSTECTOMY, BIOPSY OF GALLBLADDER;  Surgeon: Johnathan Hausen, MD;  Location: [facility];  Service: General;  Laterality: N/A;  . COLECTOMY  1973   "part of large intestines" (05/04/2012)  . COLON RESECTION    . COLONOSCOPY    . COLONOSCOPY N/A 05/23/2014   Procedure: COLONOSCOPY;  Surgeon: Rogene Houston, MD;  [facility];  Service: Endoscopy;  Laterality: N/A;  1200  . ESOPHAGEAL DILATION N/A 05/23/2014   Procedure: ESOPHAGEAL DILATION;  Surgeon: Rogene Houston, MD;  [facility];  Service: Endoscopy;  Laterality: N/A;  . ESOPHAGOGASTRODUODENOSCOPY N/A 03/16/2014   Procedure: esophageal dilation ;  Surgeon: Rogene Houston, MD;  [facility];  Service: Endoscopy;  Laterality: N/A;  . ESOPHAGOGASTRODUODENOSCOPY N/A 05/23/2014   Procedure: ESOPHAGOGASTRODUODENOSCOPY (EGD);  Surgeon: Rogene Houston, MD;  [facility];  Service: Endoscopy;  Laterality: N/A;  .  ESOPHAGOGASTRODUODENOSCOPY (EGD) WITH PROPOFOL N/A 02/18/2017   Procedure: ESOPHAGOGASTRODUODENOSCOPY (EGD) WITH PROPOFOL;  Surgeon: Wonda Horner, MD;  Location: [facility];  Service: Endoscopy;  Laterality: N/A;  . [place]  . IR CHOLANGIOGRAM EXISTING TUBE  11/26/2016  .  KIDNEY STONE SURGERY  1970's  . PARTIAL COLECTOMY  ~ 40 years ago   some of large and small  . PORTA CATH INSERTION N/A 03/17/2017   Procedure: PORTA CATH INSERTION;  Surgeon: Katha Cabal, MD;  Location: [facility];  Service: Cardiovascular;  Laterality: N/A;  . PROSTATE SURGERY    . THYROIDECTOMY Right 05/04/2012   Procedure: RIGHT HEMI THYROIDECTOMY;  Surgeon: Ascencion Dike, MD;  Location: [facility];  Service: ENT;  Laterality: Right;  . THYROIDECTOMY    . TONSILLECTOMY AND ADENOIDECTOMY     as a child  . TRANSURETHRAL PROSTATECTOMY WITH GYRUS INSTRUMENTS N/A 07/11/2012   Procedure: TRANSURETHRAL PROSTATECTOMY WITH GYRUS INSTRUMENTS;  Surgeon: Claybon Jabs, MD;  Location: [facility];  Service: Urology;  Laterality: N/A;  . UPPER GASTROINTESTINAL ENDOSCOPY      FAMILY HISTORY: Family History  Problem Relation Age of Onset  . Diabetes Mother   . Stroke Father   . Healthy Daughter   . Healthy Son   . Heart disease Sister     ADVANCED DIRECTIVES (Y/N):  N  HEALTH MAINTENANCE: Social History   Tobacco Use  . Smoking status: Never Smoker  . Smokeless tobacco: Never Used  Substance Use Topics  . Alcohol use: No    Alcohol/week: 0.0 standard drinks  . Drug use: No     Colonoscopy:  PAP:  Bone density:  Lipid panel:  No Known Allergies  Current Outpatient Medications  Medication Sig Dispense Refill  . acetaminophen (TYLENOL) 500 MG tablet Take 1,000 mg by mouth every 4 (four) hours as needed for moderate pain or headache.     . bisacodyl (DULCOLAX) 10 MG suppository Place 1 suppository (10 mg total) rectally daily as needed for moderate constipation. 12 suppository 0  . fentaNYL (DURAGESIC - DOSED MCG/HR) 25 MCG/HR patch Place 1 patch (25 mcg total) onto the skin every 3 (three) days. 10 patch 0  . fluticasone (FLONASE) 50 MCG/ACT nasal spray USE 1 SPRAY INTO BOTH  NOSTRILS DAILY (Patient taking differently: Place 1 spray into both nostrils daily as needed for  allergies. ) 32 g 0  . HYDROcodone-acetaminophen (NORCO/VICODIN) 5-325 MG tablet Take 1-2 tablets by mouth every 4 (four) hours as needed for moderate pain. (Patient taking differently: Take 1-2 tablets by mouth every 4 (four) hours as needed for moderate pain. For moderate pain) 120 tablet 0  . lactulose (CHRONULAC) 10 GM/15ML solution Take 15 mLs (10 g total) by mouth daily. 240 mL 1  . mesalamine (PENTASA) 500 MG CR capsule Take 2 capsules (1,000 mg total) by mouth 2 (two) times daily. 360 capsule 3  . metoprolol tartrate (LOPRESSOR) 50 MG tablet Take 1 tablet (50 mg total) by mouth 2 (two) times daily. 180 tablet 3  . ondansetron (ZOFRAN ODT) 8 MG disintegrating tablet Take 1 tablet (8 mg total) by mouth every 8 (eight) hours as needed for nausea or vomiting. 20 tablet 0  . ondansetron (ZOFRAN) 8 MG tablet Take 1 tablet (8 mg total) by mouth 2 (two) times daily. 30 tablet 3  . oxybutynin (DITROPAN-XL) 5 MG 24 hr tablet TAKE 1 TABLET BY MOUTH AT  BEDTIME (Patient taking differently: Take 5 mg by mouth at  bedtime. ) 90 tablet 1  . pantoprazole (PROTONIX) 40 MG tablet Take 1 tablet (40 mg total) by mouth daily. 90 tablet 1  . Simethicone 180 MG CAPS Take 1 capsule (180 mg total) by mouth 3 (three) times daily as needed. (Patient taking differently: Take 180 mg by mouth 3 (three) times daily as needed (for gas/bloating). )  0  . LORazepam (ATIVAN) 0.5 MG tablet Take 1 tablet (0.5 mg total) by mouth every 6 (six) hours as needed for anxiety. 30 tablet 0   No current facility-administered medications for this visit.    Facility-Administered Medications Ordered in Other Visits  Medication Dose Route Frequency Provider Last Rate Last Dose  . 0.9 %  sodium chloride infusion   Intravenous Continuous Burns, Jennifer E, NP      . 0.9 %  sodium chloride infusion   Intravenous Once Lloyd Huger, MD 999 mL/hr at 11/10/17 1140    . dexamethasone (DECADRON) injection 10 mg  10 mg Intravenous Once Faythe Casa E, NP      . heparin lock flush 100 unit/mL  500 Units Intravenous Once Faythe Casa E, NP      . heparin lock flush 100 unit/mL  500 Units Intravenous Once Lloyd Huger, MD      . ondansetron [facility]) injection 8 mg  8 mg Intravenous Once Faythe Casa E, NP      . sodium chloride flush (NS) 0.9 % injection 10 mL  10 mL Intravenous PRN Jacquelin Hawking, NP   10 mL at 07/12/17 1445  . sodium chloride flush (NS) 0.9 % injection 10 mL  10 mL Intravenous PRN Lloyd Huger, MD   10 mL at 11/10/17 0901    OBJECTIVE: Vitals:   11/10/17 1000 11/10/17 1002  BP: 97/61   Pulse: (!) 102   Resp: 20   Temp: 97.8 F (36.6 C)   SpO2:  99%     Body mass index is 25.84 kg/m.    ECOG FS:3 - Symptomatic, >50% confined to bed  General: Ill-appearing, no acute distress, sitting in a wheelchair. Eyes: Pink conjunctiva, anicteric sclera. HEENT: Normocephalic, moist mucous membranes. Lungs: Clear to auscultation bilaterally. Heart: Regular rate and rhythm. No rubs, murmurs, or gallops. Abdomen: Soft, nontender, nondistended. No organomegaly noted, normoactive bowel sounds. Musculoskeletal: No edema, cyanosis, or clubbing. Neuro: Alert, answering all questions appropriately. Cranial nerves grossly intact. Skin: No rashes or petechiae noted. Psych: Normal affect.  LAB RESULTS:  Lab Results  Component Value Date   NA 136 11/10/2017   K 3.9 11/10/2017   CL 108 11/10/2017   CO2 18 (L) 11/10/2017   GLUCOSE 150 (H) 11/10/2017   BUN 26 (H) 11/10/2017   CREATININE 1.62 (H) 11/10/2017   CALCIUM 7.7 (L) 11/10/2017   PROT 6.3 (L) 11/10/2017   ALBUMIN 2.5 (L) 11/10/2017   AST 20 11/10/2017   ALT 16 11/10/2017   ALKPHOS 230 (H) 11/10/2017   BILITOT 0.6 11/10/2017   GFRNONAA 38 (L) 11/10/2017   GFRAA 45 (L) 11/10/2017    Lab Results  Component Value Date   WBC 4.4 11/10/2017   NEUTROABS 2.8 11/10/2017   HGB 7.9 (L) 11/10/2017   HCT 24.6 (L) 11/10/2017   MCV 95.0  11/10/2017   PLT 151 11/10/2017     STUDIES: Ct Abdomen Pelvis W Contrast  Result Date: 10/28/2017 CLINICAL DATA:  82 year old male with history of gallbladder carcinoma. Patient presenting with vomiting. EXAM: CT ABDOMEN AND PELVIS WITH CONTRAST TECHNIQUE: Multidetector  CT imaging of the abdomen and pelvis was performed using the standard protocol following bolus administration of intravenous contrast. CONTRAST:  58m OMNIPAQUE IOHEXOL 300 MG/ML  SOLN COMPARISON:  Abdominal CT dated 03/05/2017 and PET-CT dated 10/11/2017 FINDINGS: Lower chest: There are 2 nodules in the left lower lobe measuring up to 7 mm, 1 new, and the other increased in size since the prior CT concerning for metastatic disease. There is coronary vascular calcification. No intra-abdominal free air. Small ascites increased since the prior PET. Hepatobiliary: A 9 mm hypodense focus in the anterior liver (series 2, image 16) is not well characterized. Additional subcentimeter hypodense foci noted in the liver. There is periportal edema or mild biliary ductal dilatation. The gallbladder is distended. There is a history of subtotal cholecystectomy. There is a focal area of thickening of the gallbladder wall adjacent to the second portion of the duodenum (series 5, image 33). Pancreas: The body and tail of the pancreas are atrophic with interval progression of atrophy since CT of 03/05/2017. Spleen: Normal in size without focal abnormality. Adrenals/Urinary Tract: A 4.7 x 5.3 cm heterogeneously enhancing right adrenal mass appears larger since the prior PET when it measured 4.6 x 4.9 cm. Left adrenal thickening. Bilateral renal cysts measure up to 9.4 cm in the upper pole of the right kidney. A 2 cm exophytic lesion from the posterior cortex of the right kidney demonstrates high attenuation, likely a complex or hemorrhagic cyst. There is no hydronephrosis on either side. There is symmetric enhancement of the kidneys. The urinary bladder is  partially distended and grossly unremarkable. Stomach/Bowel: There is sigmoid diverticulosis without active inflammatory changes. Mildly thickened stomach and top-normal fluid-filled loops of small bowel may be related to ascites although gastroenteritis is not excluded. Clinical correlation is recommended. No evidence of bowel obstruction. Normal appendix. There is abutment of the Patty flexure of the colon to the gallbladder. Direct invasion of the colon by the gallbladder tumor was reported on the prior CT. Vascular/Lymphatic: Moderate aortoiliac atherosclerotic disease. No portal venous gas. There is no retroperitoneal adenopathy. There is diffuse omental nodularity and caking. Diffuse mesenteric edema. Reproductive: Enlarged prostate gland with probable postprocedural changes of TURP. The prostate measures 5.2 cm in transverse diameter. Other: Bilateral fat containing inguinal hernia, left greater right. Musculoskeletal: Degenerative changes of the spine. IMPRESSION: 1. Reactive thickening of the stomach versus gastroenteritis. Clinical correlation is recommended. No bowel obstruction. Normal appendix. 2. Small ascites diffuse mesenteric edema, and omental nodularity and caking consistent with metastasis. 3. Abutment of the gallbladder to the hepatic flexure of the colon as well as second portion of the duodenum. Direct invasion of the colon was reported on the prior CT. 4. Atrophy of the body and tail of the pancreas, progressed since the prior CT. 5. Increased size of the right adrenal metastatic disease. 6. Increase in the size of the left lower lobe pulmonary nodules concerning for metastatic disease. Electronically Signed   By: AAnner CreteM.D.   On: 10/28/2017 23:23    ASSESSMENT: Adenocarcinoma of gallbladder, MSI-stable.  PLAN:    1. Adenocarcinoma of gallbladder: PET scan results from October 11, 2017 reviewed independently with significant progression of disease.  Patient attempted one  infusion of palliative FOLFOX treatment, but did not tolerate this well and is elected to discontinue treatment altogether.  After lengthy discussion with palliative care, patient has agreed to enroll in hospice.  Given his poor appetite and dehydration, he has requested continued IV fluids which can be completed under  hospice care.  No further intervention or follow-up has been scheduled.  2.  Disposition: Patient is now enrolling in hospice.  Appreciate palliative care input.   3.  Poor appetite: Continue Megace as prescribed.  IV fluids as needed with hospice care. 3.  Pain: Patient does not complain of this today.  Well controlled on his current narcotic regimen. 5.  Anemia: Patient's hemoglobin has trended down to 7.9.  He does not require transfusion.  No additional blood draws are recommended.    I spent a total of 30 minutes face-to-face with the patient of which greater than 50% of the visit was spent in counseling and coordination of care as detailed above.   Patient expressed understanding and was in agreement with this plan. He also understands that He can call clinic at any time with any questions, concerns, or complaints.   Cancer Staging Adenocarcinoma of gallbladder [facility]) Staging form: Gallbladder, AJCC 8th Edition - Clinical stage from 03/03/2017: Stage IVB (cT3, cN0, cM1) - Signed by Lloyd Huger, MD on 03/23/2017   Lloyd Huger, MD   11/10/2017 12:24 PM

## 2017-11-10 ENCOUNTER — Inpatient Hospital Stay (HOSPITAL_BASED_OUTPATIENT_CLINIC_OR_DEPARTMENT_OTHER): Payer: Medicare Other | Admitting: Hospice and Palliative Medicine

## 2017-11-10 ENCOUNTER — Other Ambulatory Visit: Payer: Self-pay | Admitting: *Deleted

## 2017-11-10 ENCOUNTER — Inpatient Hospital Stay: Payer: Medicare Other

## 2017-11-10 ENCOUNTER — Inpatient Hospital Stay (HOSPITAL_BASED_OUTPATIENT_CLINIC_OR_DEPARTMENT_OTHER): Payer: Medicare Other | Admitting: Oncology

## 2017-11-10 ENCOUNTER — Encounter: Payer: Self-pay | Admitting: Oncology

## 2017-11-10 VITALS — BP 97/61 | HR 102 | Temp 97.8°F | Resp 20 | Wt 175.0 lb

## 2017-11-10 DIAGNOSIS — D649 Anemia, unspecified: Secondary | ICD-10-CM | POA: Diagnosis not present

## 2017-11-10 DIAGNOSIS — Z515 Encounter for palliative care: Secondary | ICD-10-CM

## 2017-11-10 DIAGNOSIS — R531 Weakness: Secondary | ICD-10-CM | POA: Diagnosis not present

## 2017-11-10 DIAGNOSIS — E43 Unspecified severe protein-calorie malnutrition: Secondary | ICD-10-CM | POA: Diagnosis not present

## 2017-11-10 DIAGNOSIS — E860 Dehydration: Secondary | ICD-10-CM | POA: Diagnosis not present

## 2017-11-10 DIAGNOSIS — R410 Disorientation, unspecified: Secondary | ICD-10-CM | POA: Diagnosis not present

## 2017-11-10 DIAGNOSIS — C23 Malignant neoplasm of gallbladder: Secondary | ICD-10-CM

## 2017-11-10 DIAGNOSIS — R61 Generalized hyperhidrosis: Secondary | ICD-10-CM | POA: Diagnosis not present

## 2017-11-10 DIAGNOSIS — R112 Nausea with vomiting, unspecified: Secondary | ICD-10-CM | POA: Diagnosis not present

## 2017-11-10 DIAGNOSIS — C7971 Secondary malignant neoplasm of right adrenal gland: Secondary | ICD-10-CM

## 2017-11-10 DIAGNOSIS — F5089 Other specified eating disorder: Secondary | ICD-10-CM | POA: Diagnosis not present

## 2017-11-10 DIAGNOSIS — I959 Hypotension, unspecified: Secondary | ICD-10-CM | POA: Diagnosis not present

## 2017-11-10 DIAGNOSIS — Z66 Do not resuscitate: Secondary | ICD-10-CM | POA: Diagnosis not present

## 2017-11-10 DIAGNOSIS — R944 Abnormal results of kidney function studies: Secondary | ICD-10-CM | POA: Diagnosis not present

## 2017-11-10 LAB — COMPREHENSIVE METABOLIC PANEL
ALBUMIN: 2.5 g/dL — AB (ref 3.5–5.0)
ALT: 16 U/L (ref 0–44)
AST: 20 U/L (ref 15–41)
Alkaline Phosphatase: 230 U/L — ABNORMAL HIGH (ref 38–126)
Anion gap: 10 (ref 5–15)
BILIRUBIN TOTAL: 0.6 mg/dL (ref 0.3–1.2)
BUN: 26 mg/dL — AB (ref 8–23)
CO2: 18 mmol/L — ABNORMAL LOW (ref 22–32)
CREATININE: 1.62 mg/dL — AB (ref 0.61–1.24)
Calcium: 7.7 mg/dL — ABNORMAL LOW (ref 8.9–10.3)
Chloride: 108 mmol/L (ref 98–111)
GFR calc Af Amer: 45 mL/min — ABNORMAL LOW (ref >60)
GFR, EST NON AFRICAN AMERICAN: 38 mL/min — AB (ref >60)
GLUCOSE: 150 mg/dL — AB (ref 70–99)
POTASSIUM: 3.9 mmol/L (ref 3.5–5.1)
Sodium: 136 mmol/L (ref 135–145)
Total Protein: 6.3 g/dL — ABNORMAL LOW (ref 6.5–8.1)

## 2017-11-10 LAB — CBC WITH DIFFERENTIAL/PLATELET
ABS IMMATURE GRANULOCYTES: 0.02 10*3/uL (ref 0.00–0.07)
Basophils Absolute: 0 10*3/uL (ref 0.0–0.1)
Basophils Relative: 1 %
EOS PCT: 3 %
Eosinophils Absolute: 0.1 10*3/uL (ref 0.0–0.5)
HEMATOCRIT: 24.6 % — AB (ref 39.0–52.0)
HEMOGLOBIN: 7.9 g/dL — AB (ref 13.0–17.0)
Immature Granulocytes: 1 %
LYMPHS PCT: 18 %
Lymphs Abs: 0.8 10*3/uL (ref 0.7–4.0)
MCH: 30.5 pg (ref 26.0–34.0)
MCHC: 32.1 g/dL (ref 30.0–36.0)
MCV: 95 fL (ref 80.0–100.0)
MONO ABS: 0.6 10*3/uL (ref 0.1–1.0)
MONOS PCT: 15 %
Neutro Abs: 2.8 10*3/uL (ref 1.7–7.7)
Neutrophils Relative %: 62 %
PLATELETS: 151 10*3/uL (ref 150–400)
RBC: 2.59 MIL/uL — ABNORMAL LOW (ref 4.22–5.81)
RDW: 15.1 % (ref 11.5–15.5)
WBC: 4.4 10*3/uL (ref 4.0–10.5)
nRBC: 0 % (ref 0.0–0.2)

## 2017-11-10 LAB — SAMPLE TO BLOOD BANK

## 2017-11-10 MED ORDER — HEPARIN SOD (PORK) LOCK FLUSH 100 UNIT/ML IV SOLN
500.0000 [IU] | Freq: Once | INTRAVENOUS | Status: AC
  Administered 2017-11-10: 500 [IU] via INTRAVENOUS
  Filled 2017-11-10: qty 5

## 2017-11-10 MED ORDER — LORAZEPAM 0.5 MG PO TABS: 0.5000 mg | ORAL_TABLET | Freq: Four times a day (QID) | ORAL | 0 refills | Status: DC | PRN

## 2017-11-10 MED ORDER — LACTULOSE 10 GM/15ML PO SOLN: 10.0000 g | Freq: Every day | ORAL | 1 refills | Status: AC | PRN

## 2017-11-10 MED ORDER — FLUTICASONE PROPIONATE 50 MCG/ACT NA SUSP: 1.0000 | Freq: Every day | NASAL | 0 refills | Status: AC | PRN

## 2017-11-10 MED ORDER — SODIUM CHLORIDE 0.9 % IV SOLN
Freq: Once | INTRAVENOUS | Status: AC
  Administered 2017-11-10: 12:00:00 via INTRAVENOUS
  Filled 2017-11-10: qty 250

## 2017-11-10 MED ORDER — SODIUM CHLORIDE 0.9% FLUSH
10.0000 mL | INTRAVENOUS | Status: DC | PRN
  Administered 2017-11-10: 10 mL via INTRAVENOUS
  Filled 2017-11-10: qty 10

## 2017-11-10 MED ORDER — VITAMIN B-12 1000 MCG PO TABS: 1000.0000 ug | ORAL_TABLET | Freq: Every day | ORAL | 0 refills | Status: DC

## 2017-11-10 NOTE — Progress Notes (Signed)
[facility]  [phone] [phone]   Name: Thomas David Date: 11/10/2017 MRN: [id number]  DOB: 18-Jul-1935  Patient Care Team: Leone Haven, MD as PCP - General (Family Medicine) Kathyrn Drown, MD (Family Medicine) Clent Jacks, RN as Registered Nurse    REASON FOR CONSULTATION: Palliative Care consult requested for this 82 y.o. male with multiple medical problems including stage IV adenocarcinoma of the gallbladder who is on second line [facility].  Patient has had symptom progression with poor oral intake and weakness requiring transition from assisted living to skilled nursing facility.  Patient returns today to the clinic for consideration of treatment.  Palliative care was asked to help establish treatment goals.   SOCIAL HISTORY:    She is a resident at the [facility].  He is a widower, his wife having died about 9 months ago.  Patient has a daughter, who lives out of state but is very involved in his care.  Patient also has a son who lives out of state.  ADVANCE DIRECTIVES:  Patient has advanced directives on file.  He is a DNR at the facility.  CODE STATUS: DNR  PAST MEDICAL HISTORY: Past Medical History:  Diagnosis Date  . Anxiety   . Arthritis   . Cancer ([place])    gallbladder CA  . Chronic lower back pain   . Crohn's disease ([place])   . Depression   . Diabetes mellitus without complication ([place])   . Difficult intubation    patient denies on 02/12/17  . GERD (gastroesophageal reflux disease)   . Gout   . History of kidney stones 40 years ago  . Hypercholesteremia   . Hypertension   . Prediabetes   . Sleep apnea    cpap     PAST SURGICAL HISTORY:  Past Surgical History:  Procedure Laterality Date  . CHOLECYSTECTOMY N/A 11/03/2016   Procedure: LAPAROSCOPIC CHOLECYSTostomy with tube placement ;  Surgeon: Johnathan Hausen, MD;  Location: [facility];  Service: General;  Laterality:  N/A;  . CHOLECYSTECTOMY N/A 02/15/2017   Procedure: LAPAROSCOPIC SUBTOTAL CHOLECYSTECTOMY, BIOPSY OF GALLBLADDER;  Surgeon: Johnathan Hausen, MD;  Location: [facility];  Service: General;  Laterality: N/A;  . COLECTOMY  1973   "part of large intestines" (05/04/2012)  . COLON RESECTION    . COLONOSCOPY    . COLONOSCOPY N/A 05/23/2014   Procedure: COLONOSCOPY;  Surgeon: Rogene Houston, MD;  [facility];  Service: Endoscopy;  Laterality: N/A;  1200  . ESOPHAGEAL DILATION N/A 05/23/2014   Procedure: ESOPHAGEAL DILATION;  Surgeon: Rogene Houston, MD;  [facility];  Service: Endoscopy;  Laterality: N/A;  . ESOPHAGOGASTRODUODENOSCOPY N/A 03/16/2014   Procedure: esophageal dilation ;  Surgeon: Rogene Houston, MD;  [facility];  Service: Endoscopy;  Laterality: N/A;  . ESOPHAGOGASTRODUODENOSCOPY N/A 05/23/2014   Procedure: ESOPHAGOGASTRODUODENOSCOPY (EGD);  Surgeon: Rogene Houston, MD;  [facility];  Service: Endoscopy;  Laterality: N/A;  . ESOPHAGOGASTRODUODENOSCOPY (EGD) WITH PROPOFOL N/A 02/18/2017   Procedure: ESOPHAGOGASTRODUODENOSCOPY (EGD) WITH PROPOFOL;  Surgeon: Wonda Horner, MD;  Location: [facility];  Service: Endoscopy;  Laterality: N/A;  . [place]  . IR CHOLANGIOGRAM EXISTING TUBE  11/26/2016  . KIDNEY STONE SURGERY  1970's  . PARTIAL COLECTOMY  ~ 40 years ago   some of large and small  . PORTA CATH INSERTION N/A 03/17/2017   Procedure: PORTA CATH INSERTION;  Surgeon: Katha Cabal, MD;  Location: [facility];  Service: Cardiovascular;  Laterality: N/A;  . PROSTATE SURGERY    . THYROIDECTOMY Right 05/04/2012   Procedure: RIGHT HEMI THYROIDECTOMY;  Surgeon: Ascencion Dike, MD;  Location: [facility];  Service: ENT;  Laterality: Right;  . THYROIDECTOMY    . TONSILLECTOMY AND ADENOIDECTOMY     as a child  . TRANSURETHRAL PROSTATECTOMY WITH GYRUS INSTRUMENTS N/A 07/11/2012   Procedure: TRANSURETHRAL PROSTATECTOMY WITH GYRUS  INSTRUMENTS;  Surgeon: Claybon Jabs, MD;  Location: [facility];  Service: Urology;  Laterality: N/A;  . UPPER GASTROINTESTINAL ENDOSCOPY      HEMATOLOGY/ONCOLOGY HISTORY:  Oncology History   Patient with initial diagnosis in January 2019 where he was admitted to the hospital for intermittent abdominal pain for several months.  This was thought to be related to his gallbladder he had surgery in October which was unsuccessful secondary to significant inflammation.  A drain was placed.  Had a cholecystectomy in December 2018 which revealed adenocarcinoma.  Had positive margins with disease on serosal surface.  Pet scans from March 19, 2017 confirming stage IV disease with omental and liver metastasis.  Plan is to continue palliative chemotherapy using gemcitabine and cisplatin Day 1 and gemcitabine only on days 8 and 15.  Cisplatin has been dose reduced due to difficulties during cycle 1.  He is scheduled for restaging PET/CT on July 06, 2017.  Pet Scan from 07/06/17 revealed improvement of hepatic metastasis decrease in metabolic activity of right adrenal gland metastasis and resolution of metabolic activity and left adrenal gland metastasis mild decrease in metabolic activity of omental metastasis disease.  Several new small pulmonary nodules in left lower lobe hypermetabolic.     Adenocarcinoma of gallbladder ([place])   02/19/2017 Initial Diagnosis    Adenocarcinoma of gallbladder ([place])    10/20/2017 - 10/27/2017 Chemotherapy    The patient had palonosetron (ALOXI) injection 0.25 mg, 0.25 mg, Intravenous,  Once, 0 of 8 cycles oxaliplatin (ELOXATIN) 170 mg in dextrose 5 % 500 mL chemo infusion, 85 mg/m2 = 170 mg (100 % of original dose 85 mg/m2), Intravenous,  Once, 0 of 8 cycles Dose modification: 85 mg/m2 (original dose 85 mg/m2, Cycle 1, Reason: Provider Judgment)  for chemotherapy treatment.     10/26/2017 -  Chemotherapy    The patient had palonosetron (ALOXI) injection 0.25 mg, 0.25 mg,  Intravenous,  Once, 1 of 6 cycles Administration: 0.25 mg (10/27/2017) leucovorin 800 mg in dextrose 5 % 250 mL infusion, 400 mg/m2 = 800 mg, Intravenous,  Once, 1 of 6 cycles Administration: 800 mg (10/27/2017) oxaliplatin (ELOXATIN) 170 mg in dextrose 5 % 500 mL chemo infusion, 85 mg/m2 = 170 mg, Intravenous,  Once, 1 of 6 cycles Administration: 170 mg (10/27/2017) fluorouracil (ADRUCIL) chemo injection 800 mg, 400 mg/m2 = 800 mg, Intravenous,  Once, 1 of 6 cycles Administration: 800 mg (10/27/2017) fluorouracil (ADRUCIL) 4,800 mg in sodium chloride 0.9 % 54 mL chemo infusion, 2,400 mg/m2 = 4,800 mg, Intravenous, 1 Day/Dose, 1 of 6 cycles Administration: 4,800 mg (10/27/2017)  for chemotherapy treatment.      ALLERGIES:  has No Known Allergies.  MEDICATIONS:  Current Outpatient Medications  Medication Sig Dispense Refill  . acetaminophen (TYLENOL) 500 MG tablet Take 1,000 mg by mouth every 4 (four) hours as needed for moderate pain or headache.     . allopurinol (ZYLOPRIM) 300 MG tablet Take 1 tablet (300 mg total) by mouth every morning. 90 tablet 3  .  amLODipine (NORVASC) 5 MG tablet TAKE 1 TABLET BY MOUTH  DAILY (Patient taking differently: Take 5 mg by mouth daily. ) 90 tablet 3  . aspirin EC 81 MG tablet Take 81 mg by mouth at bedtime.    . bisacodyl (DULCOLAX) 10 MG suppository Place 1 suppository (10 mg total) rectally daily as needed for moderate constipation. 12 suppository 0  . fentaNYL (DURAGESIC - DOSED MCG/HR) 25 MCG/HR patch Place 1 patch (25 mcg total) onto the skin every 3 (three) days. 10 patch 0  . fluticasone (FLONASE) 50 MCG/ACT nasal spray USE 1 SPRAY INTO BOTH  NOSTRILS DAILY (Patient taking differently: Place 1 spray into both nostrils daily as needed for allergies. ) 32 g 0  . HYDROcodone-acetaminophen (NORCO/VICODIN) 5-325 MG tablet Take 1-2 tablets by mouth every 4 (four) hours as needed for moderate pain. (Patient taking differently: Take 1-2 tablets by mouth every 4  (four) hours as needed for moderate pain. For moderate pain) 120 tablet 0  . lactulose (CHRONULAC) 10 GM/15ML solution Take 15 mLs (10 g total) by mouth daily. 240 mL 1  . losartan (COZAAR) 100 MG tablet Take 1 tablet (100 mg total) by mouth daily. 90 tablet 3  . megestrol (MEGACE) 40 MG tablet Take 1 tablet (40 mg total) by mouth daily. 30 tablet 2  . mesalamine (PENTASA) 500 MG CR capsule Take 2 capsules (1,000 mg total) by mouth 2 (two) times daily. 360 capsule 3  . metoprolol tartrate (LOPRESSOR) 50 MG tablet Take 1 tablet (50 mg total) by mouth 2 (two) times daily. 180 tablet 3  . Multiple Vitamins-Minerals (OCUVITE ADULT FORMULA PO) Take 2 tablets by mouth daily.    . ondansetron (ZOFRAN ODT) 8 MG disintegrating tablet Take 1 tablet (8 mg total) by mouth every 8 (eight) hours as needed for nausea or vomiting. (Patient not taking: Reported on 11/03/2017) 20 tablet 0  . ondansetron (ZOFRAN) 8 MG tablet Take 1 tablet (8 mg total) by mouth 2 (two) times daily. 30 tablet 3  . oxybutynin (DITROPAN-XL) 5 MG 24 hr tablet TAKE 1 TABLET BY MOUTH AT  BEDTIME (Patient taking differently: Take 5 mg by mouth at bedtime. ) 90 tablet 1  . pantoprazole (PROTONIX) 40 MG tablet Take 1 tablet (40 mg total) by mouth daily. 90 tablet 1  . Simethicone 180 MG CAPS Take 1 capsule (180 mg total) by mouth 3 (three) times daily as needed. (Patient taking differently: Take 180 mg by mouth 3 (three) times daily as needed (for gas/bloating). )  0  . simvastatin (ZOCOR) 20 MG tablet Take 1 tablet (20 mg total) by mouth daily. 90 tablet 3  . vitamin B-12 (CYANOCOBALAMIN) 1000 MCG tablet Take 1,000 mcg by mouth 2 (two) times daily.     No current facility-administered medications for this visit.    Facility-Administered Medications Ordered in Other Visits  Medication Dose Route Frequency Provider Last Rate Last Dose  . 0.9 %  sodium chloride infusion   Intravenous Continuous Jacquelin Hawking, NP      . dexamethasone  (DECADRON) injection 10 mg  10 mg Intravenous Once Faythe Casa E, NP      . heparin lock flush 100 unit/mL  500 Units Intravenous Once Faythe Casa E, NP      . heparin lock flush 100 unit/mL  500 Units Intravenous Once Lloyd Huger, MD      . ondansetron [facility]) injection 8 mg  8 mg Intravenous Once Jacquelin Hawking, NP      .  sodium chloride flush (NS) 0.9 % injection 10 mL  10 mL Intravenous PRN Jacquelin Hawking, NP   10 mL at 07/12/17 1445  . sodium chloride flush (NS) 0.9 % injection 10 mL  10 mL Intravenous PRN Lloyd Huger, MD   10 mL at 11/10/17 0901    VITAL SIGNS: There were no vitals taken for this visit. There were no vitals filed for this visit.  Estimated body mass index is 25.84 kg/m as calculated from the following:   Height as of 11/03/17: 5' 9"  (1.753 m).   Weight as of an earlier encounter on 11/10/17: 175 lb (79.4 kg).  LABS: CBC:    Component Value Date/Time   WBC 4.4 11/10/2017 0901   HGB 7.9 (L) 11/10/2017 0901   HCT 24.6 (L) 11/10/2017 0901   PLT 151 11/10/2017 0901   MCV 95.0 11/10/2017 0901   NEUTROABS 2.8 11/10/2017 0901   LYMPHSABS 0.8 11/10/2017 0901   MONOABS 0.6 11/10/2017 0901   EOSABS 0.1 11/10/2017 0901   BASOSABS 0.0 11/10/2017 0901   Comprehensive Metabolic Panel:    Component Value Date/Time   NA 136 11/10/2017 0901   NA 139 10/11/2014 1026   K 3.9 11/10/2017 0901   CL 108 11/10/2017 0901   CO2 18 (L) 11/10/2017 0901   BUN 26 (H) 11/10/2017 0901   BUN 12 10/11/2014 1026   CREATININE 1.62 (H) 11/10/2017 0901   CREATININE 0.85 03/01/2014 0929   GLUCOSE 150 (H) 11/10/2017 0901   CALCIUM 7.7 (L) 11/10/2017 0901   AST 20 11/10/2017 0901   ALT 16 11/10/2017 0901   ALKPHOS 230 (H) 11/10/2017 0901   BILITOT 0.6 11/10/2017 0901   BILITOT 0.5 10/11/2014 1026   PROT 6.3 (L) 11/10/2017 0901   PROT 7.1 10/11/2014 1026   ALBUMIN 2.5 (L) 11/10/2017 0901   ALBUMIN 4.2 10/11/2014 1026    RADIOGRAPHIC STUDIES: Ct  Abdomen Pelvis W Contrast  Result Date: 10/28/2017 CLINICAL DATA:  82 year old male with history of gallbladder carcinoma. Patient presenting with vomiting. EXAM: CT ABDOMEN AND PELVIS WITH CONTRAST TECHNIQUE: Multidetector CT imaging of the abdomen and pelvis was performed using the standard protocol following bolus administration of intravenous contrast. CONTRAST:  42m OMNIPAQUE IOHEXOL 300 MG/ML  SOLN COMPARISON:  Abdominal CT dated 03/05/2017 and PET-CT dated 10/11/2017 FINDINGS: Lower chest: There are 2 nodules in the left lower lobe measuring up to 7 mm, 1 new, and the other increased in size since the prior CT concerning for metastatic disease. There is coronary vascular calcification. No intra-abdominal free air. Small ascites increased since the prior PET. Hepatobiliary: A 9 mm hypodense focus in the anterior liver (series 2, image 16) is not well characterized. Additional subcentimeter hypodense foci noted in the liver. There is periportal edema or mild biliary ductal dilatation. The gallbladder is distended. There is a history of subtotal cholecystectomy. There is a focal area of thickening of the gallbladder wall adjacent to the second portion of the duodenum (series 5, image 33). Pancreas: The body and tail of the pancreas are atrophic with interval progression of atrophy since CT of 03/05/2017. Spleen: Normal in size without focal abnormality. Adrenals/Urinary Tract: A 4.7 x 5.3 cm heterogeneously enhancing right adrenal mass appears larger since the prior PET when it measured 4.6 x 4.9 cm. Left adrenal thickening. Bilateral renal cysts measure up to 9.4 cm in the upper pole of the right kidney. A 2 cm exophytic lesion from the posterior cortex of the right kidney demonstrates high attenuation,  likely a complex or hemorrhagic cyst. There is no hydronephrosis on either side. There is symmetric enhancement of the kidneys. The urinary bladder is partially distended and grossly unremarkable.  Stomach/Bowel: There is sigmoid diverticulosis without active inflammatory changes. Mildly thickened stomach and top-normal fluid-filled loops of small bowel may be related to ascites although gastroenteritis is not excluded. Clinical correlation is recommended. No evidence of bowel obstruction. Normal appendix. There is abutment of the Patty flexure of the colon to the gallbladder. Direct invasion of the colon by the gallbladder tumor was reported on the prior CT. Vascular/Lymphatic: Moderate aortoiliac atherosclerotic disease. No portal venous gas. There is no retroperitoneal adenopathy. There is diffuse omental nodularity and caking. Diffuse mesenteric edema. Reproductive: Enlarged prostate gland with probable postprocedural changes of TURP. The prostate measures 5.2 cm in transverse diameter. Other: Bilateral fat containing inguinal hernia, left greater right. Musculoskeletal: Degenerative changes of the spine. IMPRESSION: 1. Reactive thickening of the stomach versus gastroenteritis. Clinical correlation is recommended. No bowel obstruction. Normal appendix. 2. Small ascites diffuse mesenteric edema, and omental nodularity and caking consistent with metastasis. 3. Abutment of the gallbladder to the hepatic flexure of the colon as well as second portion of the duodenum. Direct invasion of the colon was reported on the prior CT. 4. Atrophy of the body and tail of the pancreas, progressed since the prior CT. 5. Increased size of the right adrenal metastatic disease. 6. Increase in the size of the left lower lobe pulmonary nodules concerning for metastatic disease. Electronically Signed   By: Anner Crete M.D.   On: 10/28/2017 23:23    PERFORMANCE STATUS (ECOG) : 3 - Symptomatic, >50% confined to bed  Review of Systems As noted above. Otherwise, a complete review of systems is negative.  Physical Exam General: NAD, frail appearing, thin, sitting in wheelchair Cardiovascular: regular rate and  rhythm Pulmonary: clear ant fields Abdomen: soft, nontender, + bowel sounds GU: no suprapubic tenderness Extremities: no edema, no joint deformities Skin: no rashes Neurological: Weakness but otherwise nonfocal  IMPRESSION: I met today with patient and his daughter in the clinic.  After introducing myself the patient was quick to tell me that he is finished with "chemicals" and no longer wants to pursue any treatment for the cancer.  Patient says he is ready to "go home to be with his wife" and clarifies that his wife is in [place].  We talked about options to ensure he is comfortable including hospice involvement.  Both patient and daughter were in agreement with hospice at the skilled nursing facility.  Both patient and daughter had several questions about continued IV fluids and blood transfusions but recognize that these interventions would only likely temporarily improve his clinical status.  He will receive fluids today before returning to the skilled nursing facility under hospice care.  I reviewed with him his medication list as patient complains of a high pill burden.  Patient requests to be taken off any medication that is not consistent with his goal of comfort.  I recommended consideration of continuing his antihypertensives, aspirin, Megace, multivitamin, and simvastatin.  A more in-depth medication review will be completed at time of hospice admission.  Patient appears to have anxiety that exacerbates his symptoms.  Will start lorazepam as needed.  I completed a MOST form today. The patient and family outlined their wishes for the following treatment decisions:  Cardiopulmonary Resuscitation: Do Not Attempt Resuscitation (DNR/No CPR)  Medical Interventions: Comfort Measures: Keep clean, warm, and dry. Use medication by any  route, positioning, wound care, and other measures to relieve pain and suffering. Use oxygen, suction and manual treatment of airway obstruction as needed for  comfort. Do not transfer to the hospital unless comfort needs cannot be met in current location.  Antibiotics: Determine use of limitation of antibiotics when infection occurs  IV Fluids: IV fluids for a defined trial period  Feeding Tube: No feeding tube   PLAN: 1. Patient's goal is to stop treatment and pursue comfort measures 2. Lorazepam 0.42m q6h prn 3. Consider d/c of non-comfort meds as outlined above 4. MOST form was completed as outlined above 5. IV fluids today 6. Hospice referral   Patient expressed understanding and was in agreement with this plan. He also understands that He can call clinic at any time with any questions, concerns, or complaints.     Time Total: 60 minutes  Visit consisted of counseling and education dealing with the complex and emotionally intense issues of symptom management and palliative care in the setting of serious and potentially life-threatening illness.Greater than 50%  of this time was spent counseling and coordinating care related to the above assessment and plan.  Signed by: JAltha Harm DHarrison NP-C, [place]([facility])

## 2017-11-10 NOTE — Progress Notes (Signed)
Patient here today for follow up and treatment consideration regarding gallbladder cancer. Patient reports weakness, fatigue, shortness of breath.

## 2017-11-10 NOTE — Addendum Note (Signed)
Addended by: Altha Harm R on: 11/10/2017 12:56 PM   Modules accepted: Orders

## 2017-11-11 ENCOUNTER — Other Ambulatory Visit: Payer: Self-pay

## 2017-11-11 MED ORDER — LORAZEPAM 0.5 MG PO TABS: 0.5000 mg | ORAL_TABLET | Freq: Four times a day (QID) | ORAL | 0 refills | Status: AC | PRN

## 2017-11-11 NOTE — Telephone Encounter (Signed)
Rx sent to [facility] phone : [phone] , fax : [phone]

## 2017-11-12 ENCOUNTER — Telehealth: Payer: Self-pay | Admitting: *Deleted

## 2017-11-12 ENCOUNTER — Inpatient Hospital Stay: Payer: Medicare Other

## 2017-11-12 NOTE — Telephone Encounter (Signed)
Patient is on Hospice.  Please have them evaluate.  Would not recommend a transfusion.

## 2017-11-12 NOTE — Telephone Encounter (Signed)
Patient is to be opened to Hospice today, I called and spoke with Triage nurse Vicky and she will contact the facility to check on pt

## 2017-11-12 NOTE — Telephone Encounter (Signed)
Son called and had several questions which were answered as to the length of side effects and if the patient would possibly improve from his current state of health. He reports that per his sister, the patient is more SHORT OF BREATH the past few days,(his last 3 O2 sats here in office were 99, 98, 99%) His most recent Hgb was 7.9 on 11/10/17. Do you think he would benefit from a blood transfusion? If so could it wait until Monday, or should he go to get at [facility] over the weekend in [facility] or as observation? Please advise

## 2017-11-15 ENCOUNTER — Non-Acute Institutional Stay (SKILLED_NURSING_FACILITY): Payer: Medicare Other | Admitting: Adult Health

## 2017-11-15 ENCOUNTER — Other Ambulatory Visit: Payer: Self-pay | Admitting: *Deleted

## 2017-11-15 ENCOUNTER — Encounter: Payer: Self-pay | Admitting: Adult Health

## 2017-11-15 DIAGNOSIS — K219 Gastro-esophageal reflux disease without esophagitis: Secondary | ICD-10-CM

## 2017-11-15 DIAGNOSIS — N401 Enlarged prostate with lower urinary tract symptoms: Secondary | ICD-10-CM | POA: Diagnosis not present

## 2017-11-15 DIAGNOSIS — D649 Anemia, unspecified: Secondary | ICD-10-CM | POA: Diagnosis not present

## 2017-11-15 DIAGNOSIS — K5080 Crohn's disease of both small and large intestine without complications: Secondary | ICD-10-CM

## 2017-11-15 DIAGNOSIS — R350 Frequency of micturition: Secondary | ICD-10-CM

## 2017-11-15 DIAGNOSIS — C23 Malignant neoplasm of gallbladder: Secondary | ICD-10-CM

## 2017-11-15 NOTE — Progress Notes (Signed)
Location:   [facility] Room Number: [place] of Service:  SNF (31)   CODE STATUS: DNR  No Known Allergies  Chief Complaint  Patient presents with  . Medical Management of Chronic Issues    Crohn's disease of both small and large intestine without complication; gastroesophageal reflux disease without esophagitis; benign prostatic hyperplasia with urinary frequency.  Weekly follow up for the first 30 days post hospitalization.     HPI:  He is a 82 year old resident of this facility being seen for the management of his chronic illnesses: crohns disease; gerd; bph. He does have some abdominal distension; he will more than likely need a palliative paracentesis in the future. He denies any uncontrolled pain; his current regimen is effective. He does not have a great appetite; states he eats what he can.   Past Medical History:  Diagnosis Date  . Anxiety   . Arthritis   . Cancer ([place])    gallbladder CA  . Chronic lower back pain   . Crohn's disease ([place])   . Depression   . Diabetes mellitus without complication ([place])   . Difficult intubation    patient denies on 02/12/17  . GERD (gastroesophageal reflux disease)   . Gout   . History of kidney stones 40 years ago  . Hypercholesteremia   . Hypertension   . Prediabetes   . Sleep apnea    cpap     Past Surgical History:  Procedure Laterality Date  . CHOLECYSTECTOMY N/A 11/03/2016   Procedure: LAPAROSCOPIC CHOLECYSTostomy with tube placement ;  Surgeon: Johnathan Hausen, MD;  Location: [facility];  Service: General;  Laterality: N/A;  . CHOLECYSTECTOMY N/A 02/15/2017   Procedure: LAPAROSCOPIC SUBTOTAL CHOLECYSTECTOMY, BIOPSY OF GALLBLADDER;  Surgeon: Johnathan Hausen, MD;  Location: [facility];  Service: General;  Laterality: N/A;  . COLECTOMY  1973   "part of large intestines" (05/04/2012)  . COLON RESECTION    . COLONOSCOPY    . COLONOSCOPY N/A 05/23/2014   Procedure: COLONOSCOPY;  Surgeon: Rogene Houston,  MD;  [facility];  Service: Endoscopy;  Laterality: N/A;  1200  . ESOPHAGEAL DILATION N/A 05/23/2014   Procedure: ESOPHAGEAL DILATION;  Surgeon: Rogene Houston, MD;  [facility];  Service: Endoscopy;  Laterality: N/A;  . ESOPHAGOGASTRODUODENOSCOPY N/A 03/16/2014   Procedure: esophageal dilation ;  Surgeon: Rogene Houston, MD;  [facility];  Service: Endoscopy;  Laterality: N/A;  . ESOPHAGOGASTRODUODENOSCOPY N/A 05/23/2014   Procedure: ESOPHAGOGASTRODUODENOSCOPY (EGD);  Surgeon: Rogene Houston, MD;  [facility];  Service: Endoscopy;  Laterality: N/A;  . ESOPHAGOGASTRODUODENOSCOPY (EGD) WITH PROPOFOL N/A 02/18/2017   Procedure: ESOPHAGOGASTRODUODENOSCOPY (EGD) WITH PROPOFOL;  Surgeon: Wonda Horner, MD;  Location: [facility];  Service: Endoscopy;  Laterality: N/A;  . [place]  . IR CHOLANGIOGRAM EXISTING TUBE  11/26/2016  . KIDNEY STONE SURGERY  1970's  . PARTIAL COLECTOMY  ~ 40 years ago   some of large and small  . PORTA CATH INSERTION N/A 03/17/2017   Procedure: PORTA CATH INSERTION;  Surgeon: Katha Cabal, MD;  Location: [facility];  Service: Cardiovascular;  Laterality: N/A;  . PROSTATE SURGERY    . THYROIDECTOMY Right 05/04/2012   Procedure: RIGHT HEMI THYROIDECTOMY;  Surgeon: Ascencion Dike, MD;  Location: [facility];  Service: ENT;  Laterality: Right;  . THYROIDECTOMY    . TONSILLECTOMY AND ADENOIDECTOMY     as a  child  . TRANSURETHRAL PROSTATECTOMY WITH GYRUS INSTRUMENTS N/A 07/11/2012   Procedure: TRANSURETHRAL PROSTATECTOMY WITH GYRUS INSTRUMENTS;  Surgeon: Claybon Jabs, MD;  Location: [facility];  Service: Urology;  Laterality: N/A;  . UPPER GASTROINTESTINAL ENDOSCOPY      Social History   Socioeconomic History  . Marital status: Widowed    Spouse name: Not on file  . Number of children: Not on file  . Years of education: Not on file  . Highest education level: Not on file  Occupational History  . Not on  file  Social Needs  . Financial resource strain: Not on file  . Food insecurity:    Worry: Not on file    Inability: Not on file  . Transportation needs:    Medical: Not on file    Non-medical: Not on file  Tobacco Use  . Smoking status: Never Smoker  . Smokeless tobacco: Never Used  Substance and Sexual Activity  . Alcohol use: No    Alcohol/week: 0.0 standard drinks  . Drug use: No  . Sexual activity: Never  Lifestyle  . Physical activity:    Days per week: Not on file    Minutes per session: Not on file  . Stress: Not on file  Relationships  . Social connections:    Talks on phone: Not on file    Gets together: Not on file    Attends religious service: Not on file    Active member of club or organization: Not on file    Attends meetings of clubs or organizations: Not on file    Relationship status: Not on file  . Intimate partner violence:    Fear of current or ex partner: Not on file    Emotionally abused: Not on file    Physically abused: Not on file    Forced sexual activity: Not on file  Other Topics Concern  . Not on file  Social History Narrative   ** Merged History Encounter **       Family History  Problem Relation Age of Onset  . Diabetes Mother   . Stroke Father   . Healthy Daughter   . Healthy Son   . Heart disease Sister       VITAL SIGNS BP (!) 147/85   Pulse 87   Temp 98.3 F (36.8 C)   Resp 16   Ht 5' 9"  (1.753 m)   Wt 173 lb 14.4 oz (78.9 kg)   SpO2 100%   BMI 25.68 kg/m   Outpatient Encounter Medications as of 11/15/2017  Medication Sig  . acetaminophen (TYLENOL) 500 MG tablet Take 1,000 mg by mouth every 4 (four) hours as needed for moderate pain or headache.   . bisacodyl (DULCOLAX) 10 MG suppository Place 1 suppository (10 mg total) rectally daily as needed for moderate constipation.  . fentaNYL (DURAGESIC - DOSED MCG/HR) 25 MCG/HR patch Place 1 patch (25 mcg total) onto the skin every 3 (three) days.  . fluticasone (FLONASE)  50 MCG/ACT nasal spray Place 1 spray into both nostrils daily as needed for allergies.  Marland Kitchen HYDROcodone-acetaminophen (NORCO/VICODIN) 5-325 MG tablet Take 1-2 tablets by mouth every 4 (four) hours as needed for moderate pain.  . [place] (DERMACLOUD) CREA Apply a liberal amount to area of skin irritation as needed  . lactulose (CHRONULAC) 10 GM/15ML solution Take 15 mLs (10 g total) by mouth daily as needed for mild constipation.  Marland Kitchen LORazepam (ATIVAN) 0.5 MG tablet Take 1 tablet (0.5  mg total) by mouth every 6 (six) hours as needed for anxiety.  . mesalamine (PENTASA) 500 MG CR capsule Take 2 capsules (1,000 mg total) by mouth 2 (two) times daily.  . metoprolol tartrate (LOPRESSOR) 50 MG tablet Take 1 tablet (50 mg total) by mouth 2 (two) times daily.  . ondansetron (ZOFRAN ODT) 8 MG disintegrating tablet Take 1 tablet (8 mg total) by mouth every 8 (eight) hours as needed for nausea or vomiting.  . ondansetron (ZOFRAN) 8 MG tablet Take 8 mg by mouth every 8 (eight) hours as needed for nausea or vomiting.  Marland Kitchen oxybutynin (DITROPAN-XL) 5 MG 24 hr tablet TAKE 1 TABLET BY MOUTH AT  BEDTIME  . pantoprazole (PROTONIX) 40 MG tablet Take 1 tablet (40 mg total) by mouth daily.  . Simethicone 180 MG CAPS Take 1 capsule by mouth 3 (three) times daily as needed.  . vitamin B-12 (CYANOCOBALAMIN) 1000 MCG tablet Take 1 tablet (1,000 mcg total) by mouth daily.   Facility-Administered Encounter Medications as of 11/15/2017     SIGNIFICANT DIAGNOSTIC EXAMS  LABS REVIEWED: TODAY  11-10-17: wbc 4.4; hgb 7.9; hct 24.6; mcv 95.0; plt 151; glucose 150; bun 26; creat 1.62; k+ 3.9; na++ 136; ca 7.7 alk phos 230; albumin 2.5  Review of Systems  Constitutional: Negative for malaise/fatigue.  Respiratory: Negative for cough and shortness of breath.   Cardiovascular: Negative for chest pain, palpitations and leg swelling.  Gastrointestinal: Negative for abdominal pain, constipation and heartburn.        Abdominal distended   Musculoskeletal: Negative for back pain, joint pain and myalgias.       Pain is managed   Skin: Negative.   Neurological: Negative for dizziness.  Psychiatric/Behavioral: The patient is not nervous/anxious.     Physical Exam  Constitutional: He is oriented to person, place, and time. He appears well-developed and well-nourished. No distress.  Neck: No thyromegaly present.  Cardiovascular: Normal rate, regular rhythm, normal heart sounds and intact distal pulses.  Pulmonary/Chest: Effort normal and breath sounds normal. No respiratory distress.  Abdominal: Soft. Bowel sounds are normal. He exhibits distension. There is no tenderness.  Musculoskeletal: Normal range of motion. He exhibits no edema.  Lymphadenopathy:    He has no cervical adenopathy.  Neurological: He is alert and oriented to person, place, and time.  Skin: Skin is warm and dry. He is not diaphoretic. There is pallor.  Psychiatric: He has a normal mood and affect.      ASSESSMENT/ PLAN:  TODAY:   1. Essential hypertension: is stable b/p 147/85: will continue lopressor 50 mg twice daily   2.  Crohn's disease of small and large intestines, without complication: is stable will continue 1000 mg twice daily is having 3-4 stools per day.   3. GERD without esophagitis: is stable will continue protonix 40 mg daily   4. Benign prostatic hyperplasia with urinary frequency: is without change; will continue ditropan xl 5 mg daily   5. Anemia: is without change hgb 7.9 will monitor   6. 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 He may a paracentesis in the future for comfort.   Will begin lactaid four times daily as needed.       MD is aware of resident's narcotic use and is in agreement with current plan  of care. We will attempt to  wean resident as apropriate   Ok Edwards NP [facility]  Contact [phone] Monday through Friday 8am- 5pm  After hours call [phone]

## 2017-11-24 ENCOUNTER — Other Ambulatory Visit: Payer: Self-pay

## 2017-11-24 DIAGNOSIS — R180 Malignant ascites: Secondary | ICD-10-CM | POA: Diagnosis not present

## 2017-11-24 DIAGNOSIS — C786 Secondary malignant neoplasm of retroperitoneum and peritoneum: Secondary | ICD-10-CM | POA: Diagnosis not present

## 2017-11-24 DIAGNOSIS — C787 Secondary malignant neoplasm of liver and intrahepatic bile duct: Secondary | ICD-10-CM | POA: Diagnosis not present

## 2017-11-24 DIAGNOSIS — C23 Malignant neoplasm of gallbladder: Secondary | ICD-10-CM | POA: Diagnosis not present

## 2017-11-24 DIAGNOSIS — N2889 Other specified disorders of kidney and ureter: Secondary | ICD-10-CM | POA: Diagnosis not present

## 2017-11-24 MED ORDER — FENTANYL 25 MCG/HR TD PT72: 25.0000 ug | MEDICATED_PATCH | TRANSDERMAL | 0 refills | Status: AC

## 2017-11-24 NOTE — Telephone Encounter (Signed)
Rx sent to [facility] phone : [phone] , fax : [phone]

## 2017-11-25 ENCOUNTER — Encounter: Payer: Self-pay | Admitting: Adult Health

## 2017-11-25 ENCOUNTER — Non-Acute Institutional Stay (SKILLED_NURSING_FACILITY): Payer: Medicare Other | Admitting: Adult Health

## 2017-11-25 DIAGNOSIS — G8929 Other chronic pain: Secondary | ICD-10-CM

## 2017-11-25 DIAGNOSIS — K50819 Crohn's disease of both small and large intestine with unspecified complications: Secondary | ICD-10-CM

## 2017-11-25 DIAGNOSIS — I10 Essential (primary) hypertension: Secondary | ICD-10-CM

## 2017-11-25 DIAGNOSIS — C23 Malignant neoplasm of gallbladder: Secondary | ICD-10-CM | POA: Diagnosis not present

## 2017-11-25 DIAGNOSIS — F419 Anxiety disorder, unspecified: Secondary | ICD-10-CM

## 2017-11-25 NOTE — Progress Notes (Signed)
[facility]  [facility] Room Number: 354-P Place of Service:  SNF (31) Provider:  Durenda Age, NP  Patient Care Team: Leone Haven, MD as PCP - General (Family Medicine) Kathyrn Drown, MD (Family Medicine) Clent Jacks, RN as Registered Nurse  Extended Emergency Contact Information Primary Emergency Contact: Padia,Cecilia Address: [address] [place] [address] Johnnette Litter of [place] Phone: [phone] Relation: Daughter Secondary Emergency Contact: Reihl,John Address: [place] [address] Johnnette Litter of [place] Phone: [phone] Mobile Phone: [phone] Relation: Son  Code Status:  DNR  Goals of care: Advanced Directive information Advanced Directives 11/25/2017  Does Patient Have a Medical Advance Directive? Yes  Type of Advance Directive Out of facility DNR (pink MOST or yellow form)  Does patient want to make changes to medical advance directive? No - Patient declined  Copy of [place] in Chart? -  Would patient like information on creating a medical advance directive? No - Patient declined  Pre-existing out of facility DNR order (yellow form or pink MOST form) -     Chief Complaint  Patient presents with  . Acute Visit    Patient is on hospice services and has had a significant decline in condition with abdominal swelling.    HPI:  Pt is an 82 y.o. male seen today for a decline in condition with BLE edema and abdominal swelling.  He is a Hosp[facility] patient at [facility].  He has a PMH of gallbladder cancer, Crohn's disease, GERD, DM, depression, and arthritis. He was seen in the room today. Charge nurse reported that he has poor oral intake. BPs are stable -125/89, 120/81, 117/86, 135/99. He requested for ice chips andstates that his mouth is dry. He was given a teaspoon of ice chips and was able to chew  and swallow without coughing.He verbalized that he is ready to "go". He is currently followed by hospice due to his stage IV adenocarcinoma of the gallbladder.   Past Medical History:  Diagnosis Date  . Anxiety   . Arthritis   . Cancer ([place])    gallbladder CA  . Chronic lower back pain   . Crohn's disease ([place])   . Depression   . Diabetes mellitus without complication ([place])   . Difficult intubation    patient denies on 02/12/17  . GERD (gastroesophageal reflux disease)   . Gout   . History of kidney stones 40 years ago  . Hypercholesteremia   . Hypertension   . Prediabetes   . Sleep apnea    cpap    Past Surgical History:  Procedure Laterality Date  . CHOLECYSTECTOMY N/A 11/03/2016   Procedure: LAPAROSCOPIC CHOLECYSTostomy with tube placement ;  Surgeon: Johnathan Hausen, MD;  Location: [facility];  Service: General;  Laterality: N/A;  . CHOLECYSTECTOMY N/A 02/15/2017   Procedure: LAPAROSCOPIC SUBTOTAL CHOLECYSTECTOMY, BIOPSY OF GALLBLADDER;  Surgeon: Johnathan Hausen, MD;  Location: [facility];  Service: General;  Laterality: N/A;  . COLECTOMY  1973   "part of large intestines" (05/04/2012)  . COLON RESECTION    . COLONOSCOPY    . COLONOSCOPY N/A 05/23/2014   Procedure: COLONOSCOPY;  Surgeon: Rogene [place], MD;  [facility];  Service: Endoscopy;  Laterality: N/A;  1200  . ESOPHAGEAL DILATION N/A  05/23/2014   Procedure: ESOPHAGEAL DILATION;  Surgeon: Rogene [place], MD;  [facility];  Service: Endoscopy;  Laterality: N/A;  . ESOPHAGOGASTRODUODENOSCOPY N/A 03/16/2014   Procedure: esophageal dilation ;  Surgeon: Rogene [place], MD;  [facility];  Service: Endoscopy;  Laterality: N/A;  . ESOPHAGOGASTRODUODENOSCOPY N/A 05/23/2014   Procedure: ESOPHAGOGASTRODUODENOSCOPY (EGD);  Surgeon: Rogene [place], MD;  [facility];  Service: Endoscopy;  Laterality: N/A;  . ESOPHAGOGASTRODUODENOSCOPY (EGD) WITH PROPOFOL N/A 02/18/2017   Procedure:  ESOPHAGOGASTRODUODENOSCOPY (EGD) WITH PROPOFOL;  Surgeon: Wonda Horner, MD;  [facility];  Service: Endoscopy;  Laterality: N/A;  . [place]  . IR CHOLANGIOGRAM EXISTING TUBE  11/26/2016  . KIDNEY STONE SURGERY  1970's  . PARTIAL COLECTOMY  ~ 40 years ago   some of large and small  . PORTA CATH INSERTION N/A 03/17/2017   Procedure: PORTA CATH INSERTION;  Surgeon: Katha Cabal, MD;  Location: [facility];  Service: Cardiovascular;  Laterality: N/A;  . PROSTATE SURGERY    . THYROIDECTOMY Right 05/04/2012   Procedure: RIGHT HEMI THYROIDECTOMY;  Surgeon: Ascencion Dike, MD;  Location: [facility];  Service: ENT;  Laterality: Right;  . THYROIDECTOMY    . TONSILLECTOMY AND ADENOIDECTOMY     as a child  . TRANSURETHRAL PROSTATECTOMY WITH GYRUS INSTRUMENTS N/A 07/11/2012   Procedure: TRANSURETHRAL PROSTATECTOMY WITH GYRUS INSTRUMENTS;  Surgeon: Claybon Jabs, MD;  Location: [facility];  Service: Urology;  Laterality: N/A;  . UPPER GASTROINTESTINAL ENDOSCOPY      No Known Allergies  Outpatient Encounter Medications as of 11/25/2017  Medication Sig  . acetaminophen (TYLENOL) 500 MG tablet Take 1,000 mg by mouth every 4 (four) hours as needed for moderate pain or headache.   . bisacodyl (DULCOLAX) 10 MG suppository Place 1 suppository (10 mg total) rectally daily as needed for moderate constipation.  . fentaNYL (DURAGESIC - DOSED MCG/HR) 25 MCG/HR patch Place 1 patch (25 mcg total) onto the skin every 3 (three) days.  . fluticasone (FLONASE) 50 MCG/ACT nasal spray Place 1 spray into both nostrils daily as needed for allergies.  Marland Kitchen HYDROcodone-acetaminophen (NORCO/VICODIN) 5-325 MG tablet Take 1-2 tablets by mouth every 4 (four) hours as needed for moderate pain.  . [place] (DERMACLOUD) CREA Apply a liberal amount to area of skin irritation as needed  . lactase (LACTAID) 3000 units tablet Take 3,000 Units by mouth 4 (four) times daily.  Marland Kitchen lactulose (CHRONULAC)  10 GM/15ML solution Take 15 mLs (10 g total) by mouth daily as needed for mild constipation.  Marland Kitchen LORazepam (ATIVAN) 0.5 MG tablet Take 1 tablet (0.5 mg total) by mouth every 6 (six) hours as needed for anxiety.  . mesalamine (PENTASA) 500 MG CR capsule Take 2 capsules (1,000 mg total) by mouth 2 (two) times daily.  . metoprolol tartrate (LOPRESSOR) 50 MG tablet Take 1 tablet (50 mg total) by mouth 2 (two) times daily.  . ondansetron (ZOFRAN ODT) 8 MG disintegrating tablet Take 1 tablet (8 mg total) by mouth every 8 (eight) hours as needed for nausea or vomiting.  . ondansetron (ZOFRAN) 8 MG tablet Take 8 mg by mouth every 8 (eight) hours as needed for nausea or vomiting.  Marland Kitchen oxybutynin (DITROPAN-XL) 5 MG 24 hr tablet TAKE 1 TABLET BY MOUTH AT  BEDTIME  . pantoprazole (PROTONIX) 40 MG tablet Take 1 tablet (40 mg total) by mouth daily.  . Probiotic Product (RISA-BID PROBIOTIC PO) Take  1 tablet by mouth daily.  . Simethicone 180 MG CAPS Take 1 capsule by mouth 3 (three) times daily as needed.  . [DISCONTINUED] vitamin B-12 (CYANOCOBALAMIN) 1000 MCG tablet Take 1 tablet (1,000 mcg total) by mouth daily.   Facility-Administered Encounter Medications as of 11/25/2017  Medication  . 0.9 %  sodium chloride infusion  . dexamethasone (DECADRON) injection 10 mg  . heparin lock flush 100 unit/mL  . sodium chloride flush (NS) 0.9 % injection 10 mL    Review of Systems  GENERAL: No change in appetite, no fatigue, no weight changes, no fever, chills or weakness MOUTH and THROAT: Denies oral discomfort, gingival pain or bleeding   RESPIRATORY: no cough, SOB, DOE, wheezing, hemoptysis CARDIAC: No chest pain, edema or palpitations GI: No abdominal pain, diarrhea, constipation, heart burn, nausea or vomiting GU: Denies dysuria, frequency, hematuria,or discharge PSYCHIATRIC: Denies feelings of depression or anxiety. No report of hallucinations, insomnia, paranoia, or agitation    Immunization History    Administered Date(s) Administered  . H1N1 01/13/2008  . Influenza Split 12/08/2012  . Influenza, High Dose Seasonal PF 11/18/2015  . Influenza,inj,Quad PF,6+ Mos 11/27/2013, 11/28/2014  . Influenza-Unspecified 10/04/2011, 11/06/2016  . Pneumococcal Conjugate-13 08/21/2013  . Pneumococcal Polysaccharide-23 12/03/2004  . Tdap 02/03/2011  . Zoster 02/05/2011   Pertinent  Health Maintenance Due  Topic Date Due  . INFLUENZA VACCINE  12/03/2017 (Originally 09/02/2017)  . FOOT EXAM  12/03/2017 (Originally 10/21/1945)  . OPHTHALMOLOGY EXAM  11/16/2018 (Originally 10/21/1945)  . HEMOGLOBIN A1C  03/31/2018  . URINE MICROALBUMIN  10/22/2018  . PNA vac Low Risk Adult  Completed   Fall Risk  10/29/2017 08/18/2017 08/18/2017 07/07/2017 06/03/2017  Falls in the past year? No No No No No    Vitals:   11/25/17 1131  BP: 125/89  Pulse: 89  Resp: 16  Temp: 97.9 F (36.6 C)  TempSrc: Oral  SpO2: 99%  Weight: 183 lb 14.4 oz (83.4 kg)  Height: 5' 9"  (1.753 m)   Body mass index is 27.16 kg/m.  Physical Exam  GENERAL APPEARANCE: Normal body habitus SKIN:  Skin is warm and dry. MOUTH and THROAT: Lips are without lesions. Oral mucosa is moist and without lesions. Tongue is normal in shape, size, and color and without lesions RESPIRATORY: Breathing is even & unlabored, BS CTAB CARDIAC: Tachycardic, no murmur,no extra heart sounds, BLE 2+ edema, right chest portacath GI: enlarged, tight EXTREMITIES:  No cyanosis PSYCHIATRIC: Alert and oriented X 3. Affect and behavior are appropriate  Labs reviewed: Recent Labs    11/03/17 0905 11/03/17 1143 11/10/17 0901  NA 136 136 136  K 3.6 3.8 3.9  CL 104 106 108  CO2 20* 20* 18*  GLUCOSE 154* 171* 150*  BUN 39* 41* 26*  CREATININE 1.64* 1.79* 1.62*  CALCIUM 8.7* 8.4* 7.7*   Recent Labs    11/03/17 0905 11/03/17 1143 11/10/17 0901  AST 20 23 20   ALT 16 16 16   ALKPHOS 211* 197* 230*  BILITOT 0.8 0.8 0.6  PROT 6.9 6.1* 6.3*  ALBUMIN 3.0* 2.8*  2.5*   Recent Labs    11/03/17 0905 11/03/17 1143 11/10/17 0901  WBC 9.4 9.4 4.4  NEUTROABS 7.5* 8.1* 2.8  HGB 10.6* 9.9* 7.9*  HCT 31.2* 28.9* 24.6*  MCV 95.4 95.1 95.0  PLT 178 138* 151   Lab Results  Component Value Date   TSH 4.280 02/13/2013   Lab Results  Component Value Date   HGBA1C 6.9 (H) 09/28/2017   Lab  Results  Component Value Date   CHOL 107 09/14/2016   HDL 36.30 (L) 09/14/2016   LDLCALC 57 09/14/2016   LDLDIRECT 53.0 01/06/2016   TRIG 69.0 09/14/2016   CHOLHDL 3 09/14/2016    Significant Diagnostic Results in last 30 days:  Ct Abdomen Pelvis W Contrast  Result Date: 10/28/2017 CLINICAL DATA:  82 year old male with history of gallbladder carcinoma. Patient presenting with vomiting. EXAM: CT ABDOMEN AND PELVIS WITH CONTRAST TECHNIQUE: Multidetector CT imaging of the abdomen and pelvis was performed using the standard protocol following bolus administration of intravenous contrast. CONTRAST:  35m OMNIPAQUE IOHEXOL 300 MG/ML  SOLN COMPARISON:  Abdominal CT dated 03/05/2017 and PET-CT dated 10/11/2017 FINDINGS: Lower chest: There are 2 nodules in the left lower lobe measuring up to 7 mm, 1 new, and the other increased in size since the prior CT concerning for metastatic disease. There is coronary vascular calcification. No intra-abdominal free air. Small ascites increased since the prior PET. Hepatobiliary: A 9 mm hypodense focus in the anterior liver (series 2, image 16) is not well characterized. Additional subcentimeter hypodense foci noted in the liver. There is periportal edema or mild biliary ductal dilatation. The gallbladder is distended. There is a history of subtotal cholecystectomy. There is a focal area of thickening of the gallbladder wall adjacent to the second portion of the duodenum (series 5, image 33). Pancreas: The body and tail of the pancreas are atrophic with interval progression of atrophy since CT of 03/05/2017. Spleen: Normal in size without  focal abnormality. Adrenals/Urinary Tract: A 4.7 x 5.3 cm heterogeneously enhancing right adrenal mass appears larger since the prior PET when it measured 4.6 x 4.9 cm. Left adrenal thickening. Bilateral renal cysts measure up to 9.4 cm in the upper pole of the right kidney. A 2 cm exophytic lesion from the posterior cortex of the right kidney demonstrates high attenuation, likely a complex or hemorrhagic cyst. There is no hydronephrosis on either side. There is symmetric enhancement of the kidneys. The urinary bladder is partially distended and grossly unremarkable. Stomach/Bowel: There is sigmoid diverticulosis without active inflammatory changes. Mildly thickened stomach and top-normal fluid-filled loops of small bowel may be related to ascites although gastroenteritis is not excluded. Clinical correlation is recommended. No evidence of bowel obstruction. Normal appendix. There is abutment of the [place] flexure of the colon to the gallbladder. Direct invasion of the colon by the gallbladder tumor was reported on the prior CT. Vascular/Lymphatic: Moderate aortoiliac atherosclerotic disease. No portal venous gas. There is no retroperitoneal adenopathy. There is diffuse omental nodularity and caking. Diffuse mesenteric edema. Reproductive: Enlarged prostate gland with probable postprocedural changes of TURP. The prostate measures 5.2 cm in transverse diameter. Other: Bilateral fat containing inguinal hernia, left greater right. Musculoskeletal: Degenerative changes of the spine. IMPRESSION: 1. Reactive thickening of the stomach versus gastroenteritis. Clinical correlation is recommended. No bowel obstruction. Normal appendix. 2. Small ascites diffuse mesenteric edema, and omental nodularity and caking consistent with metastasis. 3. Abutment of the gallbladder to the hepatic flexure of the colon as well as second portion of the duodenum. Direct invasion of the colon was reported on the prior CT. 4. Atrophy of the body  and tail of the pancreas, progressed since the prior CT. 5. Increased size of the right adrenal metastatic disease. 6. Increase in the size of the left lower lobe pulmonary nodules concerning for metastatic disease. Electronically Signed   By: AAnner CreteM.D.   On: 10/28/2017 23:23    Assessment/Plan  1.  Adenocarcinoma of gallbladder ([place]) - stage IV, he is being followed up by hospice care, no treatments   2. Essential hypertension - well-controlled, continue Lopressor 50 mg 1 tab twice a day  3. Other chronic pain -well-controlled, continue Norco 5-325 mg 1-2 tabs every 4 hours as needed, fentanyl 25 mcg/HR 1 patch transdermal every 3 days   4. Crohn's disease of small and large intestines with complication ([place]) -continue Pentasa 500 mg 2 capsules twice a day   5. Anxiety -mood this is stable, continue Ativan 0.5 mg 1/2 tab = 0.25 mg every 6 hours as needed     Family/ staff Communication:  Discussed plan of care with resident and charge nurse.  Labs/tests ordered:  None  Goals of care:   Hospice care.   Durenda Age, NP [facility] [phone] (Monday-Friday 8:00 a.m. - 5:00 p.m.) [phone] (after hours)

## 2017-12-03 ENCOUNTER — Encounter
Admission: RE | Admit: 2017-12-03 | Discharge: 2017-12-03 | Disposition: A | Discharge status: Home / Self Care | Payer: Medicare Other | Source: Ambulatory Visit | Attending: Internal Medicine | Admitting: Internal Medicine

## 2017-12-03 DEATH — deceased

## 2017-12-14 ENCOUNTER — Other Ambulatory Visit: Payer: Self-pay

## 2017-12-14 NOTE — Patient Outreach (Signed)
[facility]) Care Management  12/14/2017  Lathaniel Legate Mcelwain September 09, 1935 [id number]   Medication Adherence call to Mrs. [employer] left a message for patient to call back patient is due on Simvastatin 20 mg and Losartan 100 mg.patient is showing past due under [place].   [facility] Management Direct Dial [phone]  Fax [phone] [email or web address]

## 2018-01-19 ENCOUNTER — Ambulatory Visit: Payer: Medicare Other | Admitting: Family Medicine

## 2018-02-22 ENCOUNTER — Ambulatory Visit (INDEPENDENT_AMBULATORY_CARE_PROVIDER_SITE_OTHER): Payer: Medicare Other | Admitting: Internal Medicine

## 2019-11-30 IMAGING — CT CT ABD-PELV W/ CM
2 of 5 series · 15 of 46 positions shown, 17 images · IV contrast (APPLIED)
Comparison: Abdominal CT dated 03/05/2017 and PET-CT dated
10/11/2017

CLINICAL DATA: 82-year-old male with history of gallbladder
carcinoma. Patient presenting with vomiting.

EXAM:
CT ABDOMEN AND PELVIS WITH CONTRAST
TECHNIQUE: Multidetector CT imaging of the abdomen and pelvis was performed
using the standard protocol following bolus administration of
intravenous contrast.
CONTRAST:  75mL OMNIPAQUE IOHEXOL 300 MG/ML  SOLN

[Series 2: routine abd/pel with · axial · 0.81mm/px · z∈[-896,-456]mm · 12 of 100 slices shown, 14 images]
[im 6/100  soft-tissue]
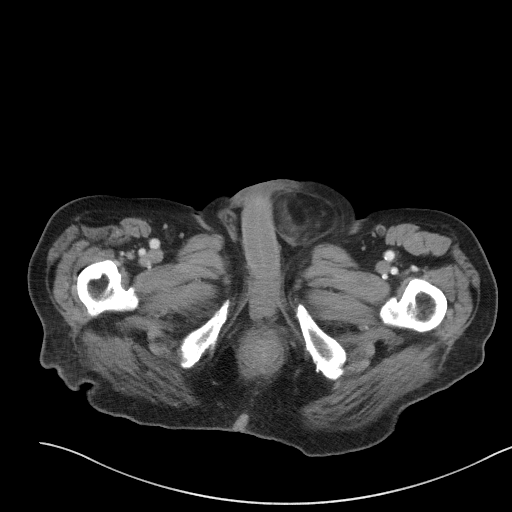
[im 6/100  bone]
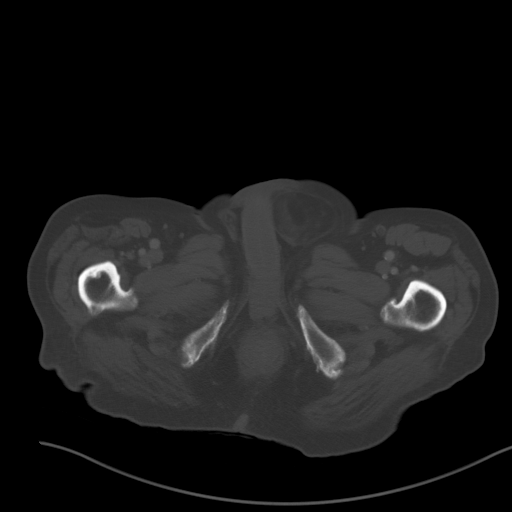
[im 17/100  soft-tissue]
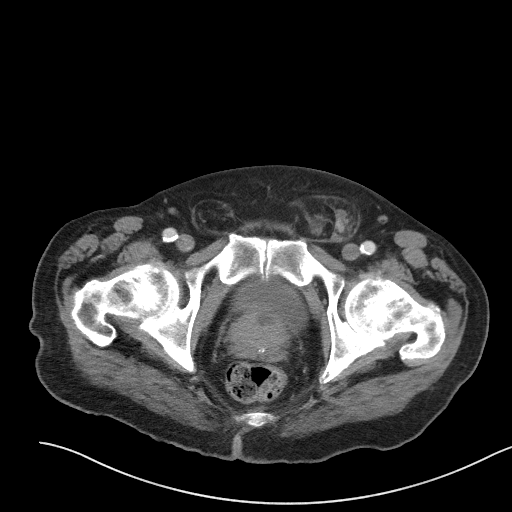
[im 23/100  soft-tissue]
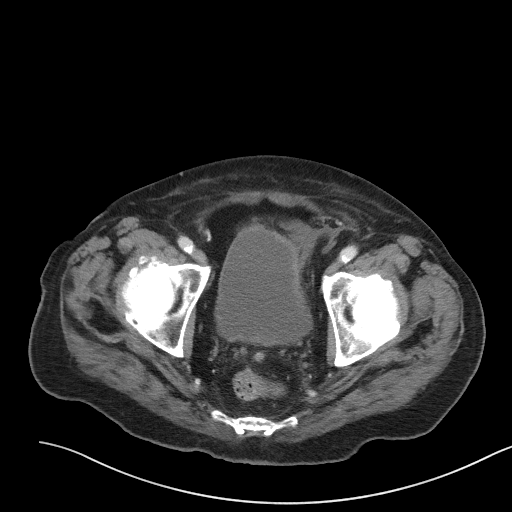
[im 28/100  soft-tissue]
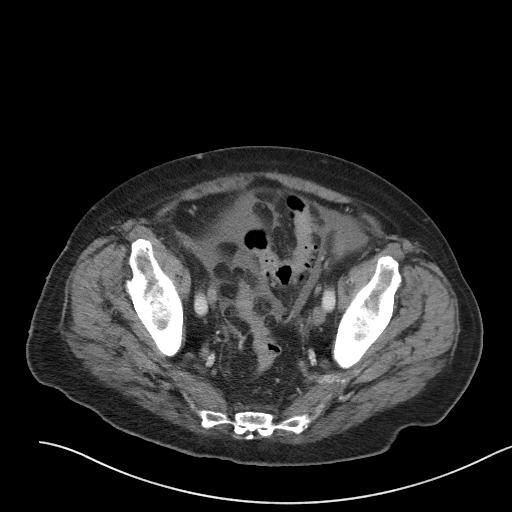
[im 39/100  soft-tissue]
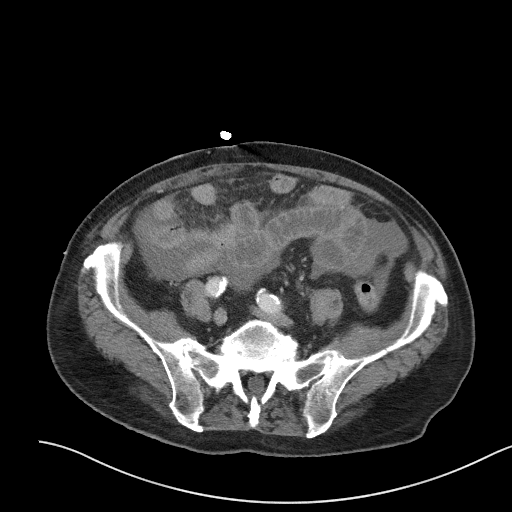
[im 45/100  soft-tissue]
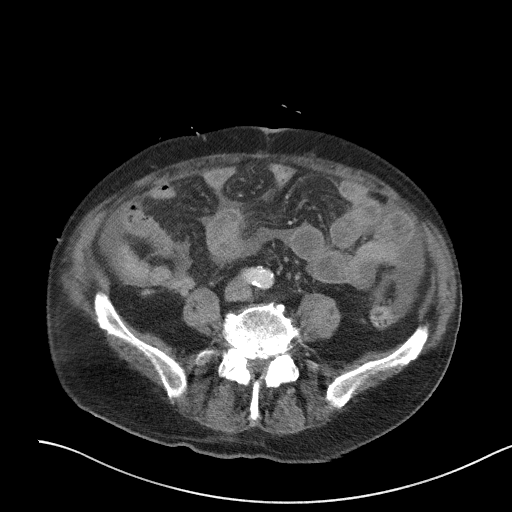
[im 56/100  soft-tissue]
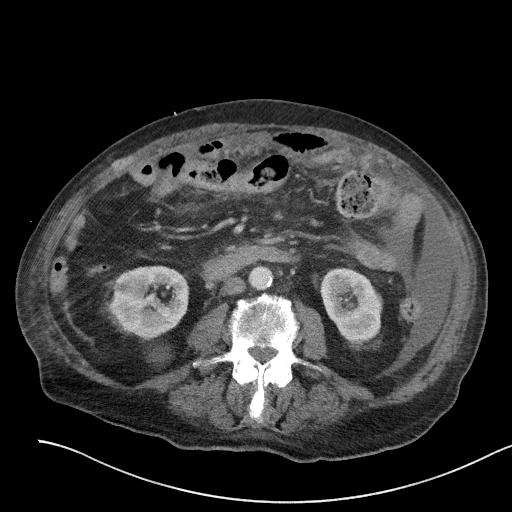
[im 61/100  soft-tissue]
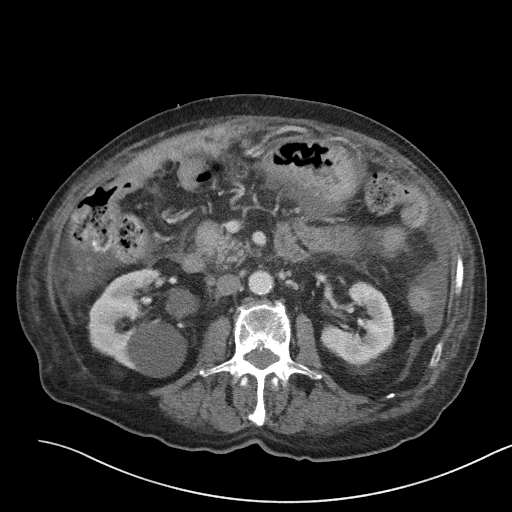
[im 72/100  soft-tissue]
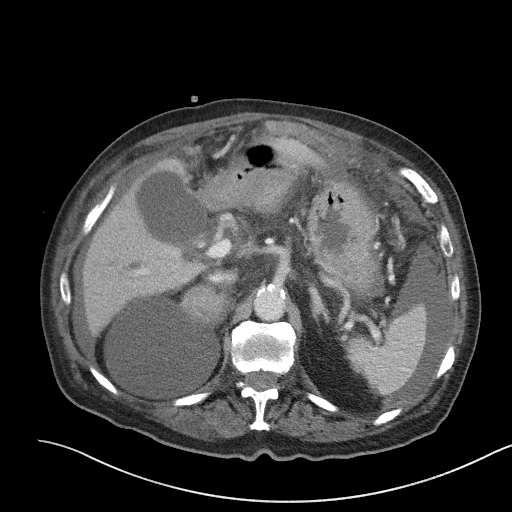
[im 72/100  bone]
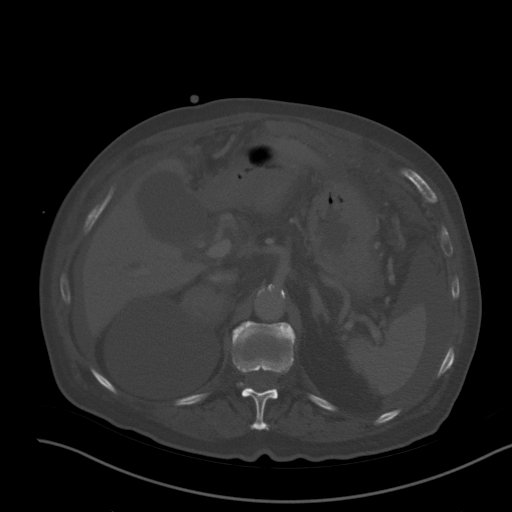
[im 78/100  soft-tissue]
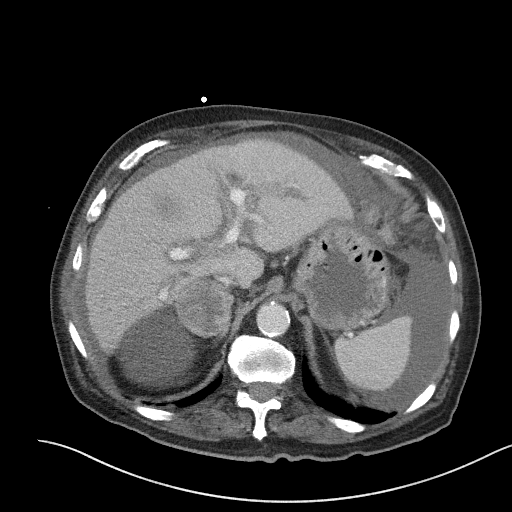
[im 83/100  soft-tissue]
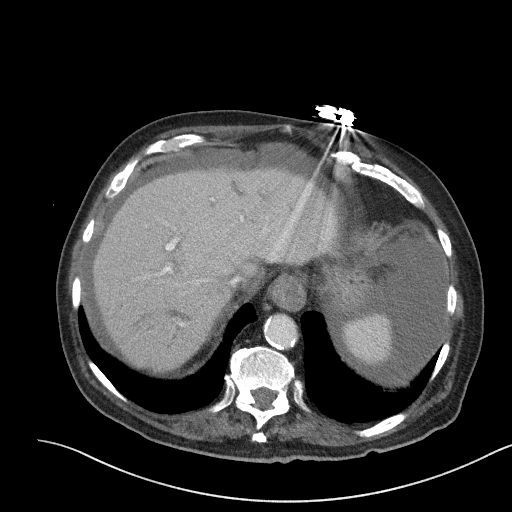
[im 94/100  soft-tissue]
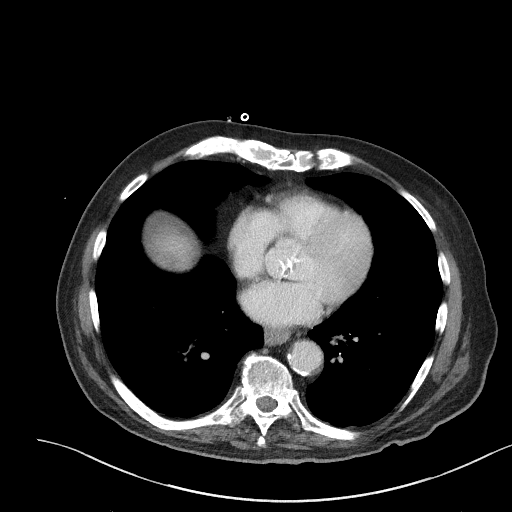

[Series 5: coronal st · coronal · 0.78mm/px · 3 of 94 slices shown]
[im 32/94  soft-tissue]
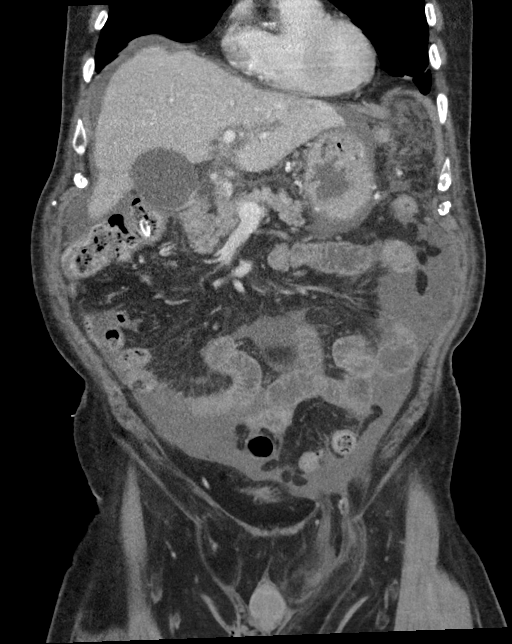
[im 42/94  soft-tissue]
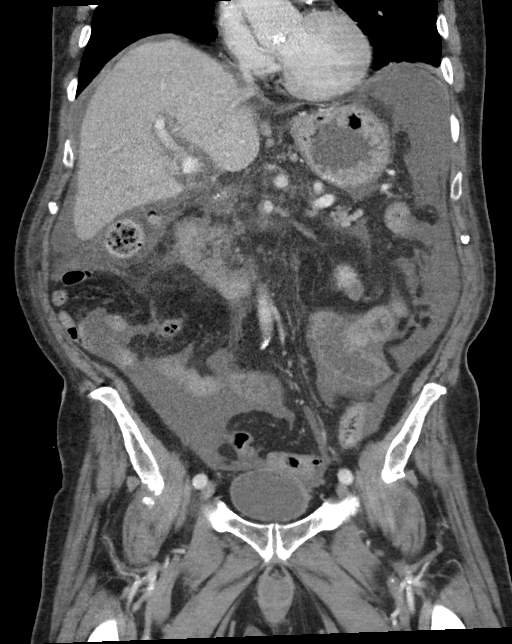
[im 52/94  soft-tissue]
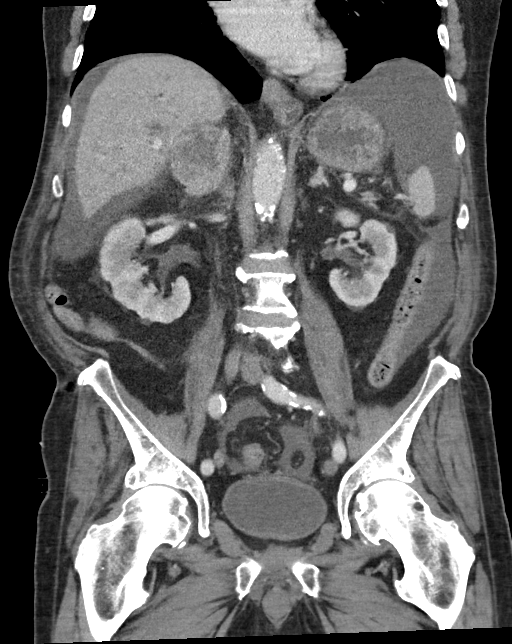

[15 of 46 positions shown; findings below may reference images not displayed]

FINDINGS: Lower chest: There are 2 nodules in the left lower lobe measuring up
to 7 mm, 1 new, and the other increased in size since the prior CT
concerning for metastatic disease. There is coronary vascular
calcification.

No intra-abdominal free air. Small ascites increased since the prior
PET.

Hepatobiliary: A 9 mm hypodense focus in the anterior liver (series
2, image 16) is not well characterized. Additional subcentimeter
hypodense foci noted in the liver. There is periportal edema or mild
biliary ductal dilatation. The gallbladder is distended. There is a
history of subtotal cholecystectomy. There is a focal area of
thickening of the gallbladder wall adjacent to the second portion of
the duodenum (series 5, image 33).

Pancreas: The body and tail of the pancreas are atrophic with
interval progression of atrophy since CT of 03/05/2017.

Spleen: Normal in size without focal abnormality.

Adrenals/Urinary Tract: A 4.7 x 5.3 cm heterogeneously enhancing
right adrenal mass appears larger since the prior PET when it
measured 4.6 x 4.9 cm. Left adrenal thickening. Bilateral renal
cysts measure up to 9.4 cm in the upper pole of the right kidney. A
2 cm exophytic lesion from the posterior cortex of the right kidney
demonstrates high attenuation, likely a complex or hemorrhagic cyst.
There is no hydronephrosis on either side. There is symmetric
enhancement of the kidneys. The urinary bladder is partially
distended and grossly unremarkable.

Stomach/Bowel: There is sigmoid diverticulosis without active
inflammatory changes. Mildly thickened stomach and top-normal
fluid-filled loops of small bowel may be related to ascites although
gastroenteritis is not excluded. Clinical correlation is
recommended. No evidence of bowel obstruction. Normal appendix.
There is abutment of the [place] flexure of the colon to the
gallbladder. Direct invasion of the colon by the gallbladder tumor
was reported on the prior CT.

Vascular/Lymphatic: Moderate aortoiliac atherosclerotic disease. No
portal venous gas. There is no retroperitoneal adenopathy. There is
diffuse omental nodularity and caking. Diffuse mesenteric edema.

Reproductive: Enlarged prostate gland with probable postprocedural
changes of TURP. The prostate measures 5.2 cm in transverse
diameter.

Other: Bilateral fat containing inguinal hernia, left greater right.

Musculoskeletal: Degenerative changes of the spine.
IMPRESSION: 1. Reactive thickening of the stomach versus gastroenteritis.
Clinical correlation is recommended. No bowel obstruction. Normal
appendix.
2. Small ascites diffuse mesenteric edema, and omental nodularity
and caking consistent with metastasis.
3. Abutment of the gallbladder to the hepatic flexure of the colon
as well as second portion of the duodenum. Direct invasion of the
colon was reported on the prior CT.
4. Atrophy of the body and tail of the pancreas, progressed since
the prior CT.
5. Increased size of the right adrenal metastatic disease.
6. Increase in the size of the left lower lobe pulmonary nodules
concerning for metastatic disease.
